# Patient Record
Sex: Female | Born: 1965 | Race: White | Hispanic: No | Smoking: Never smoker
Health system: Southern US, Community
[De-identification: ages and names within clinical notes are randomized; demographics above are authoritative.]

## PROBLEM LIST (undated history)

## (undated) DIAGNOSIS — E119 Type 2 diabetes mellitus without complications: Secondary | ICD-10-CM

## (undated) DIAGNOSIS — E669 Obesity, unspecified: Secondary | ICD-10-CM

## (undated) DIAGNOSIS — I214 Non-ST elevation (NSTEMI) myocardial infarction: Secondary | ICD-10-CM

## (undated) DIAGNOSIS — I252 Old myocardial infarction: Secondary | ICD-10-CM

## (undated) DIAGNOSIS — R061 Stridor: Secondary | ICD-10-CM

## (undated) DIAGNOSIS — M86171 Other acute osteomyelitis, right ankle and foot: Secondary | ICD-10-CM

## (undated) DIAGNOSIS — I2510 Atherosclerotic heart disease of native coronary artery without angina pectoris: Secondary | ICD-10-CM

## (undated) DIAGNOSIS — I10 Essential (primary) hypertension: Secondary | ICD-10-CM

## (undated) DIAGNOSIS — G5711 Meralgia paresthetica, right lower limb: Secondary | ICD-10-CM

## (undated) DIAGNOSIS — W19XXXA Unspecified fall, initial encounter: Secondary | ICD-10-CM

## (undated) DIAGNOSIS — I5042 Chronic combined systolic (congestive) and diastolic (congestive) heart failure: Secondary | ICD-10-CM

## (undated) DIAGNOSIS — D72829 Elevated white blood cell count, unspecified: Secondary | ICD-10-CM

## (undated) DIAGNOSIS — G4733 Obstructive sleep apnea (adult) (pediatric): Secondary | ICD-10-CM

## (undated) DIAGNOSIS — R238 Other skin changes: Secondary | ICD-10-CM

## (undated) DIAGNOSIS — J9600 Acute respiratory failure, unspecified whether with hypoxia or hypercapnia: Secondary | ICD-10-CM

## (undated) DIAGNOSIS — E11621 Type 2 diabetes mellitus with foot ulcer: Secondary | ICD-10-CM

## (undated) DIAGNOSIS — Z8614 Personal history of Methicillin resistant Staphylococcus aureus infection: Secondary | ICD-10-CM

## (undated) DIAGNOSIS — Z9889 Other specified postprocedural states: Secondary | ICD-10-CM

## (undated) DIAGNOSIS — I219 Acute myocardial infarction, unspecified: Secondary | ICD-10-CM

## (undated) DIAGNOSIS — D509 Iron deficiency anemia, unspecified: Secondary | ICD-10-CM

## (undated) DIAGNOSIS — R1310 Dysphagia, unspecified: Secondary | ICD-10-CM

## (undated) DIAGNOSIS — E876 Hypokalemia: Secondary | ICD-10-CM

## (undated) DIAGNOSIS — L97514 Non-pressure chronic ulcer of other part of right foot with necrosis of bone: Secondary | ICD-10-CM

## (undated) DIAGNOSIS — I951 Orthostatic hypotension: Secondary | ICD-10-CM

## (undated) DIAGNOSIS — J810 Acute pulmonary edema: Secondary | ICD-10-CM

## (undated) DIAGNOSIS — R0600 Dyspnea, unspecified: Secondary | ICD-10-CM

## (undated) DIAGNOSIS — Z9989 Dependence on other enabling machines and devices: Secondary | ICD-10-CM

## (undated) DIAGNOSIS — K219 Gastro-esophageal reflux disease without esophagitis: Secondary | ICD-10-CM

## (undated) DIAGNOSIS — J189 Pneumonia, unspecified organism: Secondary | ICD-10-CM

## (undated) HISTORY — DX: Orthostatic hypotension: I95.1

## (undated) HISTORY — DX: Iron deficiency anemia, unspecified: D50.9

## (undated) HISTORY — DX: Hypokalemia: E87.6

## (undated) HISTORY — DX: Old myocardial infarction: I25.2

## (undated) HISTORY — DX: Personal history of Methicillin resistant Staphylococcus aureus infection: Z86.14

## (undated) HISTORY — DX: Other specified postprocedural states: Z98.890

## (undated) HISTORY — PX: BACK SURGERY: SHX140

## (undated) HISTORY — DX: Dyspnea, unspecified: R06.00

## (undated) HISTORY — DX: Dysphagia, unspecified: R13.10

## (undated) HISTORY — DX: Type 2 diabetes mellitus with foot ulcer: E11.621

## (undated) HISTORY — PX: CORONARY ANGIOPLASTY WITH STENT PLACEMENT: SHX49

## (undated) HISTORY — DX: Unspecified fall, initial encounter: W19.XXXA

## (undated) HISTORY — DX: Stridor: R06.1

## (undated) HISTORY — DX: Elevated white blood cell count, unspecified: D72.829

## (undated) HISTORY — DX: Obstructive sleep apnea (adult) (pediatric): G47.33

## (undated) HISTORY — DX: Non-ST elevation (NSTEMI) myocardial infarction: I21.4

## (undated) HISTORY — DX: Other acute osteomyelitis, right ankle and foot: M86.171

## (undated) HISTORY — DX: Other skin changes: R23.8

## (undated) HISTORY — DX: Meralgia paresthetica, right lower limb: G57.11

## (undated) HISTORY — PX: BREAST CYST EXCISION: SHX579

## (undated) HISTORY — DX: Type 2 diabetes mellitus with foot ulcer: L97.514

## (undated) HISTORY — DX: Obesity, unspecified: E66.9

---

## 2000-04-27 ENCOUNTER — Emergency Department (HOSPITAL_COMMUNITY): Admission: EM | Admit: 2000-04-27 | Discharge: 2000-04-27 | Payer: Self-pay

## 2000-10-09 ENCOUNTER — Emergency Department (HOSPITAL_COMMUNITY): Admission: EM | Admit: 2000-10-09 | Discharge: 2000-10-09 | Payer: Self-pay | Admitting: Emergency Medicine

## 2001-03-24 ENCOUNTER — Emergency Department (HOSPITAL_COMMUNITY): Admission: EM | Admit: 2001-03-24 | Discharge: 2001-03-24 | Payer: Self-pay | Admitting: Internal Medicine

## 2001-10-25 ENCOUNTER — Emergency Department (HOSPITAL_COMMUNITY): Admission: EM | Admit: 2001-10-25 | Discharge: 2001-10-25 | Payer: Self-pay | Admitting: Emergency Medicine

## 2001-11-13 ENCOUNTER — Emergency Department (HOSPITAL_COMMUNITY): Admission: EM | Admit: 2001-11-13 | Discharge: 2001-11-13 | Payer: Self-pay | Admitting: Emergency Medicine

## 2003-03-18 ENCOUNTER — Emergency Department (HOSPITAL_COMMUNITY): Admission: EM | Admit: 2003-03-18 | Discharge: 2003-03-18 | Payer: Self-pay | Admitting: Emergency Medicine

## 2003-07-06 ENCOUNTER — Emergency Department (HOSPITAL_COMMUNITY): Admission: EM | Admit: 2003-07-06 | Discharge: 2003-07-06 | Payer: Self-pay | Admitting: Emergency Medicine

## 2003-07-11 ENCOUNTER — Emergency Department (HOSPITAL_COMMUNITY): Admission: EM | Admit: 2003-07-11 | Discharge: 2003-07-11 | Payer: Self-pay | Admitting: Emergency Medicine

## 2003-11-22 ENCOUNTER — Emergency Department (HOSPITAL_COMMUNITY): Admission: AD | Admit: 2003-11-22 | Discharge: 2003-11-22 | Payer: Self-pay | Admitting: Family Medicine

## 2003-11-23 ENCOUNTER — Emergency Department (HOSPITAL_COMMUNITY): Admission: AD | Admit: 2003-11-23 | Discharge: 2003-11-23 | Payer: Self-pay | Admitting: Family Medicine

## 2003-11-25 ENCOUNTER — Ambulatory Visit (HOSPITAL_COMMUNITY): Admission: RE | Admit: 2003-11-25 | Discharge: 2003-11-25 | Payer: Self-pay | Admitting: Family Medicine

## 2003-11-25 ENCOUNTER — Emergency Department (HOSPITAL_COMMUNITY): Admission: AD | Admit: 2003-11-25 | Discharge: 2003-11-25 | Payer: Self-pay | Admitting: Family Medicine

## 2003-11-25 ENCOUNTER — Ambulatory Visit (HOSPITAL_COMMUNITY): Admission: RE | Admit: 2003-11-25 | Discharge: 2003-11-25 | Payer: Self-pay | Admitting: General Surgery

## 2003-11-27 ENCOUNTER — Observation Stay (HOSPITAL_COMMUNITY): Admission: EM | Admit: 2003-11-27 | Discharge: 2003-11-28 | Payer: Self-pay | Admitting: Emergency Medicine

## 2003-11-27 ENCOUNTER — Emergency Department (HOSPITAL_COMMUNITY): Admission: EM | Admit: 2003-11-27 | Discharge: 2003-11-27 | Payer: Self-pay | Admitting: Emergency Medicine

## 2003-12-10 ENCOUNTER — Emergency Department (HOSPITAL_COMMUNITY): Admission: EM | Admit: 2003-12-10 | Discharge: 2003-12-10 | Payer: Self-pay | Admitting: Emergency Medicine

## 2003-12-27 ENCOUNTER — Emergency Department (HOSPITAL_COMMUNITY): Admission: EM | Admit: 2003-12-27 | Discharge: 2003-12-27 | Payer: Self-pay | Admitting: Family Medicine

## 2003-12-28 ENCOUNTER — Emergency Department (HOSPITAL_COMMUNITY): Admission: EM | Admit: 2003-12-28 | Discharge: 2003-12-28 | Payer: Self-pay | Admitting: Family Medicine

## 2003-12-29 ENCOUNTER — Emergency Department (HOSPITAL_COMMUNITY): Admission: AD | Admit: 2003-12-29 | Discharge: 2003-12-29 | Payer: Self-pay | Admitting: Family Medicine

## 2003-12-31 ENCOUNTER — Emergency Department (HOSPITAL_COMMUNITY): Admission: EM | Admit: 2003-12-31 | Discharge: 2003-12-31 | Payer: Self-pay | Admitting: Family Medicine

## 2004-01-06 ENCOUNTER — Encounter: Admission: RE | Admit: 2004-01-06 | Discharge: 2004-01-06 | Payer: Self-pay | Admitting: Internal Medicine

## 2004-01-20 ENCOUNTER — Encounter: Admission: RE | Admit: 2004-01-20 | Discharge: 2004-01-20 | Payer: Self-pay | Admitting: Internal Medicine

## 2004-02-07 ENCOUNTER — Encounter: Admission: RE | Admit: 2004-02-07 | Discharge: 2004-02-07 | Payer: Self-pay | Admitting: Internal Medicine

## 2004-02-19 ENCOUNTER — Emergency Department (HOSPITAL_COMMUNITY): Admission: EM | Admit: 2004-02-19 | Discharge: 2004-02-19 | Payer: Self-pay

## 2004-06-16 ENCOUNTER — Emergency Department (HOSPITAL_COMMUNITY): Admission: EM | Admit: 2004-06-16 | Discharge: 2004-06-16 | Payer: Self-pay | Admitting: *Deleted

## 2004-07-25 ENCOUNTER — Emergency Department (HOSPITAL_COMMUNITY): Admission: EM | Admit: 2004-07-25 | Discharge: 2004-07-25 | Payer: Self-pay

## 2004-11-01 ENCOUNTER — Emergency Department (HOSPITAL_COMMUNITY): Admission: EM | Admit: 2004-11-01 | Discharge: 2004-11-01 | Payer: Self-pay | Admitting: Family Medicine

## 2005-01-01 ENCOUNTER — Emergency Department (HOSPITAL_COMMUNITY): Admission: EM | Admit: 2005-01-01 | Discharge: 2005-01-01 | Payer: Self-pay | Admitting: Family Medicine

## 2005-02-22 ENCOUNTER — Ambulatory Visit: Payer: Self-pay | Admitting: Internal Medicine

## 2005-03-01 ENCOUNTER — Ambulatory Visit: Payer: Self-pay | Admitting: Internal Medicine

## 2005-03-19 ENCOUNTER — Ambulatory Visit (HOSPITAL_BASED_OUTPATIENT_CLINIC_OR_DEPARTMENT_OTHER): Admission: RE | Admit: 2005-03-19 | Discharge: 2005-03-19 | Payer: Self-pay | Admitting: Internal Medicine

## 2005-03-26 ENCOUNTER — Ambulatory Visit: Payer: Self-pay | Admitting: Internal Medicine

## 2005-04-06 ENCOUNTER — Ambulatory Visit: Payer: Self-pay | Admitting: Internal Medicine

## 2005-04-24 ENCOUNTER — Ambulatory Visit: Payer: Self-pay | Admitting: Gastroenterology

## 2005-06-05 ENCOUNTER — Ambulatory Visit: Payer: Self-pay | Admitting: Internal Medicine

## 2005-07-05 ENCOUNTER — Ambulatory Visit: Payer: Self-pay | Admitting: Internal Medicine

## 2005-08-03 ENCOUNTER — Emergency Department (HOSPITAL_COMMUNITY): Admission: EM | Admit: 2005-08-03 | Discharge: 2005-08-03 | Payer: Self-pay | Admitting: Emergency Medicine

## 2005-09-13 ENCOUNTER — Ambulatory Visit: Payer: Self-pay | Admitting: Gastroenterology

## 2005-09-20 ENCOUNTER — Ambulatory Visit: Payer: Self-pay | Admitting: Gastroenterology

## 2005-10-04 ENCOUNTER — Ambulatory Visit: Payer: Self-pay | Admitting: Gastroenterology

## 2005-10-17 ENCOUNTER — Emergency Department (HOSPITAL_COMMUNITY): Admission: EM | Admit: 2005-10-17 | Discharge: 2005-10-17 | Payer: Self-pay | Admitting: Family Medicine

## 2005-10-18 ENCOUNTER — Ambulatory Visit: Payer: Self-pay | Admitting: Gastroenterology

## 2005-10-19 ENCOUNTER — Inpatient Hospital Stay (HOSPITAL_COMMUNITY): Admission: EM | Admit: 2005-10-19 | Discharge: 2005-10-22 | Payer: Self-pay | Admitting: Family Medicine

## 2005-10-19 ENCOUNTER — Ambulatory Visit: Payer: Self-pay | Admitting: Hospitalist

## 2005-11-01 ENCOUNTER — Ambulatory Visit: Payer: Self-pay | Admitting: Internal Medicine

## 2005-11-08 ENCOUNTER — Ambulatory Visit: Payer: Self-pay | Admitting: Gastroenterology

## 2005-12-13 ENCOUNTER — Ambulatory Visit: Payer: Self-pay | Admitting: Gastroenterology

## 2006-01-10 ENCOUNTER — Ambulatory Visit: Payer: Self-pay | Admitting: Gastroenterology

## 2006-02-07 ENCOUNTER — Ambulatory Visit: Payer: Self-pay | Admitting: Gastroenterology

## 2006-03-07 ENCOUNTER — Ambulatory Visit: Payer: Self-pay | Admitting: Gastroenterology

## 2006-04-04 ENCOUNTER — Ambulatory Visit: Payer: Self-pay | Admitting: Gastroenterology

## 2006-05-02 ENCOUNTER — Ambulatory Visit: Payer: Self-pay | Admitting: Gastroenterology

## 2006-05-31 ENCOUNTER — Ambulatory Visit: Payer: Self-pay | Admitting: Gastroenterology

## 2006-06-27 ENCOUNTER — Ambulatory Visit: Payer: Self-pay | Admitting: Gastroenterology

## 2006-07-18 ENCOUNTER — Ambulatory Visit: Payer: Self-pay | Admitting: Gastroenterology

## 2006-07-25 ENCOUNTER — Ambulatory Visit: Payer: Self-pay | Admitting: Gastroenterology

## 2006-08-22 ENCOUNTER — Ambulatory Visit: Payer: Self-pay | Admitting: Gastroenterology

## 2006-12-11 ENCOUNTER — Emergency Department (HOSPITAL_COMMUNITY): Admission: EM | Admit: 2006-12-11 | Discharge: 2006-12-12 | Payer: Self-pay | Admitting: Emergency Medicine

## 2007-08-27 ENCOUNTER — Emergency Department (HOSPITAL_COMMUNITY): Admission: EM | Admit: 2007-08-27 | Discharge: 2007-08-27 | Payer: Self-pay | Admitting: Family Medicine

## 2007-11-19 ENCOUNTER — Emergency Department (HOSPITAL_COMMUNITY): Admission: EM | Admit: 2007-11-19 | Discharge: 2007-11-20 | Payer: Self-pay | Admitting: Emergency Medicine

## 2008-04-02 ENCOUNTER — Emergency Department (HOSPITAL_COMMUNITY): Admission: EM | Admit: 2008-04-02 | Discharge: 2008-04-02 | Payer: Self-pay | Admitting: Family Medicine

## 2008-08-11 ENCOUNTER — Emergency Department (HOSPITAL_COMMUNITY): Admission: EM | Admit: 2008-08-11 | Discharge: 2008-08-11 | Payer: Self-pay | Admitting: Family Medicine

## 2008-11-11 ENCOUNTER — Inpatient Hospital Stay (HOSPITAL_COMMUNITY): Admission: EM | Admit: 2008-11-11 | Discharge: 2008-11-13 | Payer: Self-pay | Admitting: Emergency Medicine

## 2009-04-03 ENCOUNTER — Emergency Department (HOSPITAL_COMMUNITY): Admission: EM | Admit: 2009-04-03 | Discharge: 2009-04-04 | Payer: Self-pay | Admitting: Emergency Medicine

## 2009-06-25 ENCOUNTER — Emergency Department (HOSPITAL_COMMUNITY): Admission: EM | Admit: 2009-06-25 | Discharge: 2009-06-25 | Payer: Self-pay | Admitting: Family Medicine

## 2009-07-04 ENCOUNTER — Emergency Department (HOSPITAL_COMMUNITY): Admission: EM | Admit: 2009-07-04 | Discharge: 2009-07-04 | Payer: Self-pay | Admitting: Emergency Medicine

## 2009-11-29 ENCOUNTER — Ambulatory Visit: Payer: Self-pay | Admitting: Diagnostic Radiology

## 2009-11-29 ENCOUNTER — Emergency Department (HOSPITAL_BASED_OUTPATIENT_CLINIC_OR_DEPARTMENT_OTHER): Admission: EM | Admit: 2009-11-29 | Discharge: 2009-11-29 | Payer: Self-pay | Admitting: Emergency Medicine

## 2009-12-07 ENCOUNTER — Emergency Department (HOSPITAL_COMMUNITY): Admission: EM | Admit: 2009-12-07 | Discharge: 2009-12-07 | Payer: Self-pay | Admitting: Emergency Medicine

## 2009-12-27 ENCOUNTER — Emergency Department (HOSPITAL_COMMUNITY): Admission: EM | Admit: 2009-12-27 | Discharge: 2009-12-27 | Payer: Self-pay | Admitting: Emergency Medicine

## 2010-01-17 ENCOUNTER — Emergency Department (HOSPITAL_COMMUNITY): Admission: EM | Admit: 2010-01-17 | Discharge: 2010-01-17 | Payer: Self-pay | Admitting: Family Medicine

## 2010-05-05 ENCOUNTER — Emergency Department (HOSPITAL_COMMUNITY): Admission: EM | Admit: 2010-05-05 | Discharge: 2010-05-05 | Payer: Self-pay | Admitting: Emergency Medicine

## 2010-07-21 ENCOUNTER — Inpatient Hospital Stay (HOSPITAL_COMMUNITY): Admission: EM | Admit: 2010-07-21 | Discharge: 2010-07-24 | Payer: Self-pay | Admitting: Emergency Medicine

## 2010-07-22 ENCOUNTER — Encounter (INDEPENDENT_AMBULATORY_CARE_PROVIDER_SITE_OTHER): Payer: Self-pay | Admitting: Internal Medicine

## 2010-11-05 DIAGNOSIS — Z8614 Personal history of Methicillin resistant Staphylococcus aureus infection: Secondary | ICD-10-CM

## 2010-11-05 HISTORY — DX: Personal history of Methicillin resistant Staphylococcus aureus infection: Z86.14

## 2011-01-18 LAB — CBC
HCT: 37.7 % (ref 36.0–46.0)
Hemoglobin: 11.7 g/dL — ABNORMAL LOW (ref 12.0–15.0)
Hemoglobin: 11.8 g/dL — ABNORMAL LOW (ref 12.0–15.0)
MCH: 24.6 pg — ABNORMAL LOW (ref 26.0–34.0)
MCHC: 31.2 g/dL (ref 30.0–36.0)
MCV: 79 fL (ref 78.0–100.0)
MCV: 80.5 fL (ref 78.0–100.0)
Platelets: 189 10*3/uL (ref 150–400)
Platelets: 215 10*3/uL (ref 150–400)
RBC: 4.76 MIL/uL (ref 3.87–5.11)
RBC: 4.77 MIL/uL (ref 3.87–5.11)
RBC: 4.79 MIL/uL (ref 3.87–5.11)
WBC: 13.1 10*3/uL — ABNORMAL HIGH (ref 4.0–10.5)
WBC: 15.8 10*3/uL — ABNORMAL HIGH (ref 4.0–10.5)

## 2011-01-18 LAB — VITAMIN B12: Vitamin B-12: 474 pg/mL (ref 211–911)

## 2011-01-18 LAB — LIPID PANEL
HDL: 40 mg/dL (ref >39)
LDL Cholesterol: 104 mg/dL — ABNORMAL HIGH (ref 0–99)
Triglycerides: 156 mg/dL — ABNORMAL HIGH (ref <150)
VLDL: 31 mg/dL (ref 0–40)

## 2011-01-18 LAB — GLUCOSE, CAPILLARY
Glucose-Capillary: 137 mg/dL — ABNORMAL HIGH (ref 70–99)
Glucose-Capillary: 171 mg/dL — ABNORMAL HIGH (ref 70–99)
Glucose-Capillary: 171 mg/dL — ABNORMAL HIGH (ref 70–99)
Glucose-Capillary: 190 mg/dL — ABNORMAL HIGH (ref 70–99)

## 2011-01-18 LAB — CARDIAC PANEL(CRET KIN+CKTOT+MB+TROPI)
CK, MB: 2.4 ng/mL (ref 0.3–4.0)
CK, MB: 3.1 ng/mL (ref 0.3–4.0)
Relative Index: INVALID (ref 0.0–2.5)
Total CK: 50 U/L (ref 7–177)

## 2011-01-18 LAB — BASIC METABOLIC PANEL
BUN: 12 mg/dL (ref 6–23)
CO2: 29 mEq/L (ref 19–32)
CO2: 34 mEq/L — ABNORMAL HIGH (ref 19–32)
Calcium: 8.8 mg/dL (ref 8.4–10.5)
Chloride: 95 mEq/L — ABNORMAL LOW (ref 96–112)
Chloride: 98 mEq/L (ref 96–112)
Creatinine, Ser: 0.67 mg/dL (ref 0.4–1.2)
Creatinine, Ser: 0.71 mg/dL (ref 0.4–1.2)
Creatinine, Ser: 0.76 mg/dL (ref 0.4–1.2)
GFR calc Af Amer: >60 mL/min (ref >60)
GFR calc Af Amer: >60 mL/min (ref >60)
GFR calc non Af Amer: >60 mL/min (ref >60)
Glucose, Bld: 138 mg/dL — ABNORMAL HIGH (ref 70–99)
Glucose, Bld: 192 mg/dL — ABNORMAL HIGH (ref 70–99)
Potassium: 3.5 mEq/L (ref 3.5–5.1)
Potassium: 3.8 mEq/L (ref 3.5–5.1)
Sodium: 136 mEq/L (ref 135–145)

## 2011-01-18 LAB — IRON AND TIBC
Iron: 39 ug/dL — ABNORMAL LOW (ref 42–135)
TIBC: 336 ug/dL (ref 250–470)

## 2011-01-18 LAB — COMPREHENSIVE METABOLIC PANEL
ALT: 21 U/L (ref 0–35)
ALT: 26 U/L (ref 0–35)
AST: 28 U/L (ref 0–37)
Albumin: 3.7 g/dL (ref 3.5–5.2)
Alkaline Phosphatase: 74 U/L (ref 39–117)
Alkaline Phosphatase: 79 U/L (ref 39–117)
BUN: 12 mg/dL (ref 6–23)
BUN: 9 mg/dL (ref 6–23)
Chloride: 96 mEq/L (ref 96–112)
Chloride: 96 mEq/L (ref 96–112)
GFR calc Af Amer: >60 mL/min (ref >60)
Glucose, Bld: 194 mg/dL — ABNORMAL HIGH (ref 70–99)
Potassium: 3.9 mEq/L (ref 3.5–5.1)
Potassium: 4 mEq/L (ref 3.5–5.1)
Sodium: 137 mEq/L (ref 135–145)
Sodium: 137 mEq/L (ref 135–145)
Total Bilirubin: 0.9 mg/dL (ref 0.3–1.2)
Total Bilirubin: 1 mg/dL (ref 0.3–1.2)

## 2011-01-18 LAB — DIFFERENTIAL
Basophils Absolute: 0 10*3/uL (ref 0.0–0.1)
Basophils Relative: 0 % (ref 0–1)
Eosinophils Absolute: 0.1 10*3/uL (ref 0.0–0.7)
Monocytes Relative: 5 % (ref 3–12)
Neutro Abs: 10.6 10*3/uL — ABNORMAL HIGH (ref 1.7–7.7)
Neutrophils Relative %: 84 % — ABNORMAL HIGH (ref 43–77)

## 2011-01-18 LAB — HEMOGLOBIN A1C
Hgb A1c MFr Bld: 8.8 % — ABNORMAL HIGH (ref <5.7)
Mean Plasma Glucose: 206 mg/dL — ABNORMAL HIGH (ref <117)

## 2011-01-18 LAB — TSH: TSH: 1.519 u[IU]/mL (ref 0.350–4.500)

## 2011-01-18 LAB — RAPID URINE DRUG SCREEN, HOSP PERFORMED
Cocaine: NOT DETECTED
Tetrahydrocannabinol: POSITIVE — AB

## 2011-01-18 LAB — FERRITIN: Ferritin: 51 ng/mL (ref 10–291)

## 2011-01-18 LAB — TROPONIN I: Troponin I: 0.05 ng/mL (ref 0.00–0.06)

## 2011-01-18 LAB — RETICULOCYTES: Retic Ct Pct: 2.8 % (ref 0.4–3.1)

## 2011-01-18 LAB — PROTIME-INR
INR: 1.1 (ref 0.00–1.49)
Prothrombin Time: 14.4 seconds (ref 11.6–15.2)

## 2011-01-18 LAB — CK TOTAL AND CKMB (NOT AT ARMC)
CK, MB: 2.8 ng/mL (ref 0.3–4.0)
Total CK: 83 U/L (ref 7–177)

## 2011-01-18 LAB — FOLATE: Folate: 16 ng/mL

## 2011-01-18 LAB — POCT CARDIAC MARKERS: CKMB, poc: 1.3 ng/mL (ref 1.0–8.0)

## 2011-01-18 LAB — APTT: aPTT: 32 seconds (ref 24–37)

## 2011-01-18 LAB — BRAIN NATRIURETIC PEPTIDE: Pro B Natriuretic peptide (BNP): <30 pg/mL (ref 0.0–100.0)

## 2011-01-18 LAB — MRSA PCR SCREENING: MRSA by PCR: NEGATIVE

## 2011-01-21 LAB — URINALYSIS, ROUTINE W REFLEX MICROSCOPIC
Bilirubin Urine: NEGATIVE
Nitrite: NEGATIVE
Specific Gravity, Urine: 1.018 (ref 1.005–1.030)
Urobilinogen, UA: 1 mg/dL (ref 0.0–1.0)
pH: 7.5 (ref 5.0–8.0)

## 2011-01-21 LAB — DIFFERENTIAL
Basophils Absolute: 0 10*3/uL (ref 0.0–0.1)
Eosinophils Relative: 1 % (ref 0–5)
Lymphocytes Relative: 15 % (ref 12–46)
Lymphs Abs: 1.7 10*3/uL (ref 0.7–4.0)
Neutro Abs: 9.3 10*3/uL — ABNORMAL HIGH (ref 1.7–7.7)
Neutrophils Relative %: 82 % — ABNORMAL HIGH (ref 43–77)

## 2011-01-21 LAB — POCT I-STAT, CHEM 8
Calcium, Ion: 0.98 mmol/L — ABNORMAL LOW (ref 1.12–1.32)
Chloride: 102 mEq/L (ref 96–112)
Creatinine, Ser: 0.7 mg/dL (ref 0.4–1.2)
Glucose, Bld: 196 mg/dL — ABNORMAL HIGH (ref 70–99)
Hemoglobin: 12.9 g/dL (ref 12.0–15.0)
Potassium: 3.9 mEq/L (ref 3.5–5.1)

## 2011-01-21 LAB — POCT CARDIAC MARKERS
CKMB, poc: <1 ng/mL — ABNORMAL LOW (ref 1.0–8.0)
Troponin i, poc: <0.05 ng/mL (ref 0.00–0.09)

## 2011-01-21 LAB — CBC
MCV: 75.9 fL — ABNORMAL LOW (ref 78.0–100.0)
Platelets: 192 10*3/uL (ref 150–400)
RBC: 4.67 MIL/uL (ref 3.87–5.11)
RDW: 18.7 % — ABNORMAL HIGH (ref 11.5–15.5)
WBC: 11.4 10*3/uL — ABNORMAL HIGH (ref 4.0–10.5)

## 2011-01-21 LAB — BRAIN NATRIURETIC PEPTIDE: Pro B Natriuretic peptide (BNP): 96 pg/mL (ref 0.0–100.0)

## 2011-01-21 LAB — URINE MICROSCOPIC-ADD ON

## 2011-01-21 LAB — GLUCOSE, CAPILLARY: Glucose-Capillary: 210 mg/dL — ABNORMAL HIGH (ref 70–99)

## 2011-01-22 LAB — BASIC METABOLIC PANEL
Chloride: 99 mEq/L (ref 96–112)
GFR calc non Af Amer: >60 mL/min (ref >60)
Potassium: 4 mEq/L (ref 3.5–5.1)
Sodium: 139 mEq/L (ref 135–145)

## 2011-01-22 LAB — CBC
HCT: 35.3 % — ABNORMAL LOW (ref 36.0–46.0)
Hemoglobin: 11.7 g/dL — ABNORMAL LOW (ref 12.0–15.0)
RBC: 4.58 MIL/uL (ref 3.87–5.11)
WBC: 11.2 10*3/uL — ABNORMAL HIGH (ref 4.0–10.5)

## 2011-01-22 LAB — DIFFERENTIAL
Eosinophils Relative: 1 % (ref 0–5)
Lymphocytes Relative: 20 % (ref 12–46)
Lymphs Abs: 2.3 10*3/uL (ref 0.7–4.0)
Monocytes Absolute: 0.4 10*3/uL (ref 0.1–1.0)
Monocytes Relative: 4 % (ref 3–12)

## 2011-01-22 LAB — POCT B-TYPE NATRIURETIC PEPTIDE (BNP): B Natriuretic Peptide, POC: 46.5 pg/mL (ref 0–100)

## 2011-01-22 LAB — POCT CARDIAC MARKERS
CKMB, poc: <1 ng/mL — ABNORMAL LOW (ref 1.0–8.0)
Troponin i, poc: <0.05 ng/mL (ref 0.00–0.09)

## 2011-01-24 LAB — GLUCOSE, CAPILLARY: Glucose-Capillary: 191 mg/dL — ABNORMAL HIGH (ref 70–99)

## 2011-01-29 ENCOUNTER — Emergency Department (HOSPITAL_COMMUNITY)
Admission: EM | Admit: 2011-01-29 | Discharge: 2011-01-29 | Disposition: A | Discharge status: Home / Self Care | Payer: Self-pay | Attending: Emergency Medicine | Admitting: Emergency Medicine

## 2011-01-29 DIAGNOSIS — R599 Enlarged lymph nodes, unspecified: Secondary | ICD-10-CM | POA: Insufficient documentation

## 2011-01-29 DIAGNOSIS — I10 Essential (primary) hypertension: Secondary | ICD-10-CM | POA: Insufficient documentation

## 2011-01-29 DIAGNOSIS — J309 Allergic rhinitis, unspecified: Secondary | ICD-10-CM | POA: Insufficient documentation

## 2011-01-29 DIAGNOSIS — J029 Acute pharyngitis, unspecified: Secondary | ICD-10-CM | POA: Insufficient documentation

## 2011-01-29 DIAGNOSIS — E119 Type 2 diabetes mellitus without complications: Secondary | ICD-10-CM | POA: Insufficient documentation

## 2011-01-29 DIAGNOSIS — R220 Localized swelling, mass and lump, head: Secondary | ICD-10-CM | POA: Insufficient documentation

## 2011-01-29 DIAGNOSIS — H9209 Otalgia, unspecified ear: Secondary | ICD-10-CM | POA: Insufficient documentation

## 2011-01-29 DIAGNOSIS — R221 Localized swelling, mass and lump, neck: Secondary | ICD-10-CM | POA: Insufficient documentation

## 2011-01-29 DIAGNOSIS — E785 Hyperlipidemia, unspecified: Secondary | ICD-10-CM | POA: Insufficient documentation

## 2011-01-29 LAB — RAPID STREP SCREEN (MED CTR MEBANE ONLY): Streptococcus, Group A Screen (Direct): NEGATIVE

## 2011-01-30 ENCOUNTER — Emergency Department (HOSPITAL_COMMUNITY)
Admission: EM | Admit: 2011-01-30 | Discharge: 2011-01-31 | Disposition: A | Discharge status: Home / Self Care | Payer: Self-pay | Attending: Emergency Medicine | Admitting: Emergency Medicine

## 2011-01-30 DIAGNOSIS — G4730 Sleep apnea, unspecified: Secondary | ICD-10-CM | POA: Insufficient documentation

## 2011-01-30 DIAGNOSIS — E785 Hyperlipidemia, unspecified: Secondary | ICD-10-CM | POA: Insufficient documentation

## 2011-01-30 DIAGNOSIS — I10 Essential (primary) hypertension: Secondary | ICD-10-CM | POA: Insufficient documentation

## 2011-01-30 DIAGNOSIS — R5381 Other malaise: Secondary | ICD-10-CM | POA: Insufficient documentation

## 2011-01-30 DIAGNOSIS — H9209 Otalgia, unspecified ear: Secondary | ICD-10-CM | POA: Insufficient documentation

## 2011-01-30 DIAGNOSIS — E119 Type 2 diabetes mellitus without complications: Secondary | ICD-10-CM | POA: Insufficient documentation

## 2011-01-30 DIAGNOSIS — E669 Obesity, unspecified: Secondary | ICD-10-CM | POA: Insufficient documentation

## 2011-01-30 DIAGNOSIS — J029 Acute pharyngitis, unspecified: Secondary | ICD-10-CM | POA: Insufficient documentation

## 2011-01-30 DIAGNOSIS — R6883 Chills (without fever): Secondary | ICD-10-CM | POA: Insufficient documentation

## 2011-02-06 ENCOUNTER — Inpatient Hospital Stay (INDEPENDENT_AMBULATORY_CARE_PROVIDER_SITE_OTHER)
Admission: RE | Admit: 2011-02-06 | Discharge: 2011-02-06 | Disposition: A | Discharge status: Home / Self Care | Payer: Self-pay | Source: Ambulatory Visit | Attending: Family Medicine | Admitting: Family Medicine

## 2011-02-06 DIAGNOSIS — J019 Acute sinusitis, unspecified: Secondary | ICD-10-CM

## 2011-02-06 DIAGNOSIS — R198 Other specified symptoms and signs involving the digestive system and abdomen: Secondary | ICD-10-CM

## 2011-02-19 ENCOUNTER — Inpatient Hospital Stay (INDEPENDENT_AMBULATORY_CARE_PROVIDER_SITE_OTHER)
Admission: RE | Admit: 2011-02-19 | Discharge: 2011-02-19 | Disposition: A | Discharge status: Home / Self Care | Payer: Self-pay | Source: Ambulatory Visit | Attending: Family Medicine | Admitting: Family Medicine

## 2011-02-19 DIAGNOSIS — H109 Unspecified conjunctivitis: Secondary | ICD-10-CM

## 2011-02-19 LAB — CBC
HCT: 35.2 % — ABNORMAL LOW (ref 36.0–46.0)
MCHC: 31.9 g/dL (ref 30.0–36.0)
MCHC: 32.5 g/dL (ref 30.0–36.0)
MCV: 76.2 fL — ABNORMAL LOW (ref 78.0–100.0)
MCV: 77 fL — ABNORMAL LOW (ref 78.0–100.0)
Platelets: 194 10*3/uL (ref 150–400)
Platelets: 225 10*3/uL (ref 150–400)
RBC: 4.98 MIL/uL (ref 3.87–5.11)
RDW: 17.3 % — ABNORMAL HIGH (ref 11.5–15.5)

## 2011-02-19 LAB — HEPARIN LEVEL (UNFRACTIONATED)
Heparin Unfractionated: 0.35 IU/mL (ref 0.30–0.70)
Heparin Unfractionated: 0.38 IU/mL (ref 0.30–0.70)

## 2011-02-19 LAB — COMPREHENSIVE METABOLIC PANEL
AST: 23 U/L (ref 0–37)
CO2: 28 mEq/L (ref 19–32)
Chloride: 104 mEq/L (ref 96–112)
Creatinine, Ser: 0.59 mg/dL (ref 0.4–1.2)
GFR calc Af Amer: >60 mL/min (ref >60)
GFR calc non Af Amer: >60 mL/min (ref >60)
Glucose, Bld: 229 mg/dL — ABNORMAL HIGH (ref 70–99)
Total Bilirubin: 0.6 mg/dL (ref 0.3–1.2)

## 2011-02-19 LAB — LIPID PANEL
HDL: 40 mg/dL (ref >39)
LDL Cholesterol: 118 mg/dL — ABNORMAL HIGH (ref 0–99)
Total CHOL/HDL Ratio: 5.2 RATIO
Triglycerides: 130 mg/dL (ref <150)
VLDL: 26 mg/dL (ref 0–40)
VLDL: 38 mg/dL (ref 0–40)

## 2011-02-19 LAB — CARDIAC PANEL(CRET KIN+CKTOT+MB+TROPI)
CK, MB: 1.2 ng/mL (ref 0.3–4.0)
CK, MB: 1.5 ng/mL (ref 0.3–4.0)
Relative Index: INVALID (ref 0.0–2.5)
Troponin I: 0.01 ng/mL (ref 0.00–0.06)
Troponin I: 0.02 ng/mL (ref 0.00–0.06)

## 2011-02-19 LAB — DIFFERENTIAL
Basophils Absolute: 0 10*3/uL (ref 0.0–0.1)
Basophils Relative: 0 % (ref 0–1)
Monocytes Relative: 3 % (ref 3–12)
Neutro Abs: 8.4 10*3/uL — ABNORMAL HIGH (ref 1.7–7.7)
Neutrophils Relative %: 81 % — ABNORMAL HIGH (ref 43–77)

## 2011-02-19 LAB — GLUCOSE, CAPILLARY
Glucose-Capillary: 187 mg/dL — ABNORMAL HIGH (ref 70–99)
Glucose-Capillary: 190 mg/dL — ABNORMAL HIGH (ref 70–99)
Glucose-Capillary: 233 mg/dL — ABNORMAL HIGH (ref 70–99)

## 2011-02-19 LAB — POCT CARDIAC MARKERS
CKMB, poc: 1.5 ng/mL (ref 1.0–8.0)
Myoglobin, poc: 47.5 ng/mL (ref 12–200)

## 2011-02-19 LAB — BASIC METABOLIC PANEL
CO2: 27 mEq/L (ref 19–32)
Calcium: 8.7 mg/dL (ref 8.4–10.5)
Creatinine, Ser: 0.75 mg/dL (ref 0.4–1.2)
GFR calc Af Amer: >60 mL/min (ref >60)

## 2011-02-19 LAB — PROTIME-INR: INR: 1 (ref 0.00–1.49)

## 2011-02-19 LAB — HEMOGLOBIN A1C: Hgb A1c MFr Bld: 9 % — ABNORMAL HIGH (ref 4.6–6.1)

## 2011-02-19 LAB — TROPONIN I: Troponin I: 0.01 ng/mL (ref 0.00–0.06)

## 2011-02-19 LAB — C-REACTIVE PROTEIN: CRP: 3.2 mg/dL — ABNORMAL HIGH (ref <0.6)

## 2011-02-19 LAB — CK TOTAL AND CKMB (NOT AT ARMC): Relative Index: INVALID (ref 0.0–2.5)

## 2011-03-23 NOTE — H&P (Signed)
[name], [name]                           ACCOUNT NO.:  [id number]   MEDICAL RECORD NO.:  [id number]                   PATIENT TYPE:  OIB   LOCATION:  2852                                 FACILITY:  [facility]   PHYSICIAN:  Jimmye Norman III, M.D.               DATE OF BIRTH:  1966-01-08   DATE OF ADMISSION:  11/25/2003  DATE OF DISCHARGE:  11/25/2003                                HISTORY & PHYSICAL   IDENTIFICATION AND CHIEF COMPLAINT:  The patient is a 45 year old female  with a right breast abscess who is being admitted for incision, drainage and  packing.   HISTORY OF PRESENT ILLNESS:  I was contacted by the urgent care center about  this patient early in the day who had been treated for several days and  maybe weeks for right breast abscess.  Previous aspiration attempts were  negative and attempt to drain it in the office was negative.  However, an  ultrasound demonstrated a fluid collection in the right breast and she was  called in for surgical evaluation.   PAST MEDICAL HISTORY:  Her past medical history, by her report, is  unremarkable.  She has no history of diabetes or other significant medical  problems.  She takes only the antibiotics that she was given for her right  breast abscess and has pain medicines also.   PHYSICAL EXAMINATION:  VITAL SIGNS:  On examination, she is afebrile.  Her  blood pressure was normal in the preop holding area, but once she was  elevated to 164/96, her temperature was 99.1, pulse of 96, respirations 18.  BREASTS:  Right breast demonstrated a very firm, very tender and reddened  area in the midportion of the upper breast.  The firmness extended about 10  cm in maximal diameter and also a necrotic center with fluctuance.   IMPRESSION:  Right breast abscess, aerobic and possibly anaerobic.   PLAN:  I will take her to the operating room for incision and drainage and  she will get perioperative vancomycin.  By report, the patient has MRSA  that  has grown out of previous aspiration and we will cover her with that with  vancomycin.  Postoperatively, we will use Zyvox.                                                Kathrin Ruddy, M.D.    JW/MEDQ  D:  11/25/2003  T:  11/26/2003  Job:  [id number]

## 2011-03-23 NOTE — Discharge Summary (Signed)
[name], [name]                 ACCOUNT NO.:  [id number]   MEDICAL RECORD NO.:  [id number]          PATIENT TYPE:  INP   LOCATION:  3034                         FACILITY:  [facility]   PHYSICIAN:  Eliseo Gum, M.D.   DATE OF BIRTH:  1966-02-06   DATE OF ADMISSION:  10/19/2005  DATE OF DISCHARGE:  10/22/2005                                 DISCHARGE SUMMARY   DISCHARGE DIAGNOSIS:  1.  Methicillin resistant Staphylococcus aureus abscess of left forearm.  2.  Type 2 diabetes mellitus with hemoglobin A1C of 7.  3.  Hypertension.   PAST MEDICAL HISTORY:  History of multiple MRSA abscesses, obesity,  hepatitis C on treatment.   DISCHARGE MEDICATIONS:  Metformin 1000 mg p.o. b.i.d. (this has been  increased from 500 mg p.o. b.i.d.), Lisinopril 40 mg p.o. daily, Epogen 200  mg p.o. b.i.d., Tikosyn subcutaneous injection every Saturday, Doxycycline  100 mg p.o. b.i.d. for five more days for a total of ten days of  antibiotics.   FOLLOW UP:  The patient will follow up with Dr. Merlene Pulling at the outpatient  clinic on November 01, 2005, at 2:30 p.m. for follow up on the abscess.  An  appointment will be scheduled on that day with her clinic doctor, as well.   ADMISSION HISTORY:  [name] is a 45 year old obese female with a past  medical history of diabetes, hepatitis C, recurrent abscesses since her  diagnosis of diabetes in 2003 who presented to Urgent Care on October 17, 2005, with a recent abscess on the left forearm.  This abscess was  subsequently drained and the patient followed up with Urgent Care today.  However, per Urgent Care report, it did not look very good and, thus, she is  being admitted for IV vancomycin because of failure of Doxycycline.   The patient does not smoke, no alcohol use, no IV drug use.   SOCIAL HISTORY:  The patient is self-care, lives with her daughter who is 68-  years-old.   PHYSICAL EXAMINATION:  Vital signs:  Temperature 97.7, blood pressure  156/100, pulse 80, respirations 24, O2 saturations 97% on room air.  General:  The patient is in no acute distress.  Lungs:  Clear to  auscultation bilaterally, no rales, rhonchi, or wheezing.  Cardiovascular:  Regular rate and rhythm, no murmurs.  GI:  Abdomen soft, nontender,  nondistended, positive bowel sounds.  Extremities:  No cyanosis, clubbing,  and edema.  Skin:  She has a left forearm induration about 6 cm which is  actively draining.  The surrounding skin is erythematous and tender.   HOSPITAL COURSE:  Problem 1:  MRSA abscess of the left forearm.  As mentioned earlier, the  patient received incision and drainage procedure on October 17, 2005, at  Urgent Care.  The culture grew MRSA which was sensitive to Doxycycline,  however, because of lack of improvement with Doxycycline, the patient is  admitted for IV vancomycin.  She received IV vancomycin for a total of four  days and she is being discharged home on p.o. Doxycycline for the next five  days for a total of ten days of antibiotics.  She will be followed at the  outpatient clinic by Dr. Merlene Pulling on November 01, 2005, to insure complete  resolution.   Problem 2:  Type 2 diabetes mellitus.  A hemoglobin A1C was checked and was  7.  Her Metformin dose was increased to 1000 mg p.o. b.i.d.   Problem 3:  Hypertension.  Her Lisinopril was continued to 40 mg p.o. daily.   Problem 4:  Hepatitis C.  Her hepatitis C medicines were continued in the  hospital.   DISCHARGE LABORATORY DATA:  White count 6.3, hemoglobin 11.8, platelets 182.  Hemoglobin A1C 7.  Fasting lipid panel done on October 19, 2005, showed  total cholesterol 175, triglycerides 154, HDL 46, LDL 98.  TSH 1.984.      Ellie Lunch, M.D.  Electronically Signed      Eliseo Gum, M.D.  Electronically Signed    BP/MEDQ  D:  10/22/2005  T:  10/23/2005  Job:  [id number]   cc:   Manning Charity, MD  Fax: [phone]

## 2011-03-23 NOTE — H&P (Signed)
NAMEKRYSTENA, [name]                           ACCOUNT NO.:  [id number]   MEDICAL RECORD NO.:  [id number]                   PATIENT TYPE:  INP   LOCATION:  1829                                 FACILITY:  [facility]   PHYSICIAN:  Adolph Pollack, M.D.            DATE OF BIRTH:  [birth year]   DATE OF ADMISSION:  11/27/2003  DATE OF DISCHARGE:                                HISTORY & PHYSICAL   REASON FOR ADMISSION:  Right breast wound bleeding.   HISTORY OF PRESENT ILLNESS:  [name] is a 45 year old female who underwent  incision and drainage and packing of a right breast abscess by Dr. Lindie Spruce on  November 25, 2003.  She returned to the emergency department today for  removal of the packing, and I inspected the wound, and it was hemostatic.  A  Penrose drain was placed in this to keep the wound open.  She went home and  then had a coughing episode and began having significant bleeding from the  wound and thus presented back to the emergency department.  In spite of our  efforts to try to stop the bleeding with direct pressure, she continued to  have bleeding.  She is being admitted to go to the operating room for  control of the right breast wound bleeding.   PAST MEDICAL HISTORY:  Right breast abscess.   PREVIOUS OPERATIONS:  Incision and drainage of right breast abscess.   ALLERGIES:  None.   REVIEW OF SYSTEMS:  CARDIOVASCULAR:  No heart disease or hypertension.  ENDOCRINE:  No diabetes.  PULMONARY:  No asthma, pneumonia, COPD.  GASTROINTESTINAL:  No peptic ulcer disease or hepatitis.  HEMATOLOGIC:  No  known bleeding disorders or deep vein thrombosis.   PHYSICAL EXAM:  GENERAL:  Obese female who is a bit anxious, but she is very  pleasant and cooperative.  NECK:  Supple.  Without masses.  CARDIOVASCULAR:  Heart demonstrates a regular rate and rhythm.  No murmur  heard.  LUNGS:  Breath sounds equal and clear.  Respirations unlabored.  BREASTS:  There is a 3- to 4-cm wound in the  right breast superior aspect  with some bright red blood emanating from it.  This is covered now with  multiple gauze packs.  ABDOMEN:  Soft and nontender.  EXTREMITIES:  Full range of motion.  No cyanosis or edema.   IMPRESSION:  Bleeding right breast wound, status post incision and drainage  of abscess 2 days ago.   PLAN:  To the OR for wound exploration and hemostasis.  This has been  explained to her, and she is agreeable.                                                Adolph Pollack, M.D.  TJR/MEDQ  D:  11/27/2003  T:  11/27/2003  Job:  [id number]

## 2011-03-23 NOTE — Op Note (Signed)
[name], [name]                           ACCOUNT NO.:  [id number]   MEDICAL RECORD NO.:  [id number]                   PATIENT TYPE:  OIB   LOCATION:  2852                                 FACILITY:  [facility]   PHYSICIAN:  Jimmye Norman III, M.D.               DATE OF BIRTH:  01-01-1966   DATE OF PROCEDURE:  11/25/2003  DATE OF DISCHARGE:  11/25/2003                                 OPERATIVE REPORT   PREOPERATIVE DIAGNOSES:  Right breast abscess.   POSTOPERATIVE DIAGNOSES:  Right breast abscess.   OPERATION PERFORMED:  Incision and drainage and packing of right breast  abscess.   SURGEON:  Marta Lamas. Lindie Spruce, M.D.   ASSISTANT:  None.   ANESTHESIA:  General endotracheal.   ESTIMATED BLOOD LOSS:  Less than 20 mL.   COMPLICATIONS:  None.   CONDITION:  Stable.   PACKING:  Two full bottles of 1/2 inch iodoform Nugauze packed into the  wound.   DESCRIPTION OF PROCEDURE:  The patient was taken to the operating room and  placed on the table in the supine position.  After an adequate endotracheal  anesthetic was administered, the patient was prepped and draped in the usual  sterile manner exposing the right breast.  This abscess cavity was in the  upper midportion of the right breast.  It already had a necrotic center.  We  incised along the necrotic center down deep into the breast and it took  about an inch to an inch and a half depth prior to getting into a purulent  cavity which was not foul-smelling and sort of a brownish mucopurulence  drainage.  We sent aerobic and anaerobic cultures.   We subsequently broke up all loculations down to the pectoral fascia,  irrigated out with saline and then subsequently packed it with two full  bottles of half inch iodoform Nugauze.  The patient did receive preoperative  vancomycin.  Once it was packed it was covered with sterile dressing.  The  patient was then transferred to the recovery room in stable condition.                         Kathrin Ruddy, M.D.    JW/MEDQ  D:  11/25/2003  T:  11/26/2003  Job:  [id number]

## 2011-03-23 NOTE — Procedures (Signed)
[name], [name]                 ACCOUNT NO.:  [id number]   MEDICAL RECORD NO.:  [id number]          PATIENT TYPE:  OUT   LOCATION:  SLEEP CENTER                 FACILITY:  [facility]   PHYSICIAN:  Clinton D. Maple Hudson, M.D. DATE OF BIRTH:  1966/09/22   DATE OF STUDY:  03/19/2005                              NOCTURNAL POLYSOMNOGRAM   REFERRING PHYSICIAN:  Dr. Trula Ore Rama   DATE OF STUDY:  Mar 19, 2005   INDICATION FOR STUDY:  Hypersomnia with sleep apnea.  Epworth Sleepiness  Score 21/24, BMI 50, weight 280 pounds.   SLEEP ARCHITECTURE:  Total sleep time 357 minutes with sleep efficiency 82%.  Stage I was 14%, stage II 53%, stages III and IV 20%, REM was 13% of total  sleep time.  Sleep latency 16 minutes, REM latency 215 minutes, awake after  sleep onset 40 minutes, arousal index increased at 47.  No sleep medication  taken.   RESPIRATORY DATA:  Split-study protocol.  Respiratory disturbance index  (RDI, AHI) 86.2 obstructive events per hour indicating severe obstructive  sleep apnea/hypopnea syndrome before CPAP.  This included 188 obstructive  apneas and 47 hypopneas before CPAP.  Most events and most sleep were  recorded while supine.  REM RDI 6.7.  CPAP titrated successfully to 10 CWP,  RDI 3.3 per hour using a Fisher Paykel HC407 Mask with heated humidifier.   OXYGEN DATA:  Moderate to loud snoring with oxygen desaturation to a nadir  of 83% before CPAP.  After CPAP control saturation held 95% on room air.   CARDIAC DATA:  Normal sinus rhythm.   MOVEMENT/PARASOMNIA:  Occasional leg jerk.   IMPRESSION/RECOMMENDATION:  1.  Severe obstructive sleep apnea/hypopnea syndrome, respiratory      disturbance index 86.2 per hour with moderately loud snoring and oxygen      desaturation to 83%.  2.  Successful continuous positive airway pressure titration to 10 CWP,      respiratory disturbance index 3.3 per hour, using a Fisher Paykel HC407      Mask with heated humidifier.      CDY/MEDQ  D:  03/25/2005 10:49:08  T:  03/25/2005 12:11:44  Job:  [id number]

## 2011-03-23 NOTE — Discharge Summary (Signed)
NAMETERISSA, HAFFEY                 ACCOUNT NO.:  [id number]   MEDICAL RECORD NO.:  [id number]          PATIENT TYPE:  INP   LOCATION:  4708                         FACILITY:  [facility]   PHYSICIAN:  Mohan N. Sharyn Lull, M.D. DATE OF BIRTH:  1966/01/11   DATE OF ADMISSION:  11/11/2008  DATE OF DISCHARGE:  11/13/2008                               DISCHARGE SUMMARY   ADMITTING DIAGNOSES:  1. Chest pain, rule out myocardial infarction.  2. Uncontrolled hypertension.  3. Uncontrolled diabetes mellitus.  4. Morbid obesity.  5. Obstructive sleep apnea.  6. Obesity hypoventilation syndrome.  7. Positive family history of coronary artery disease.   DISCHARGE DIAGNOSES:  1. Status post chest pain, myocardial infarction ruled out, negative      Persantine Myoview.  2. Diabetes mellitus.  3. Hypertension.  4. Hypercholesteremia.  5. Morbid obesity.  6. Obstructive sleep apnea/obesity hypoventilation syndrome.  7. Positive family history of coronary artery disease.   DISCHARGE HOME MEDICATIONS:  1. Enteric-coated aspirin 81 mg 1 tablet daily.  2. Toprol-XL 50 mg 1 tablet daily.  3. Lisinopril 20 mg 1 tablet twice daily.  4. Simvastatin 20 mg 1 tablet daily at night.  5. Metformin 1000 mg 1 tablet twice daily with meals.  6. Nitrostat 0.4 mg sublingual use as directed.   DIET:  Low salt, low cholesterol, 1800 calories ADA diet.  The patient  has been advised to reduce weight.   The patient has been advised to monitor blood sugar and blood pressure  daily.  Increase activity slowly as tolerated.  Follow up with me in 1  week.   CONDITION AT DISCHARGE:  Stable.   BRIEF HISTORY AND HOSPITAL COURSE:  [name] is 45 year old white  female with past medical history significant for hypertension, non-  insulin-dependent diabetes mellitus, morbid obesity, obstructive sleep  apnea, obesity hypoventilation syndrome.  She came to the ER via EMS  complaining of retrosternal chest pressure, grade  8/10, associated with  numbness in both hands.  She took 1 aspirin with relief of chest pain.  Denies any nausea, vomiting, or diaphoresis.  Denies PND, orthopnea, or  leg swelling.  The patient gives history of exertional chest pain,  relieved with rest.  Denies any relation of chest pain to food,  breathing, or movement.   PAST MEDICAL HISTORY:  As above.   PAST SURGICAL HISTORY:  None.   SOCIAL HISTORY:  She is separated, has 1 child.  No history of smoking  or alcohol abuse.  She works for [employer]   ALLERGIES:  No known drug allergies.   MEDICATION AT HOME:  She is on lisinopril 15 mg p.o. b.i.d. and  metformin 500 mg p.o. b.i.d.   FAMILY HISTORY:  Father has coronary artery disease.  He had MI and had  coronary stents.  He is 65.  He is diabetic and hypertensive.  Uncle has  coronary artery disease,  had MI and subsequently required CABG.  Mother  is alive.  She is 61.   PHYSICAL EXAMINATION:  GENERAL:  She is alert, awake, and oriented x3,  in  no acute distress.  VITAL SIGNS:  Blood pressure was 156/79, pulse was 83.  HEENT:  Conjunctivae was pink.  NECK:  Supple.  No JVD, no bruit.  LUNGS:  Clear to auscultation without rhonchi or rales.  CARDIOVASCULAR:  S1 and S2 were normal.  There was soft systolic murmur  and S4 gallop.  ABDOMEN:  Soft, obese, nontender.  EXTREMITIES:  There is no clubbing, cyanosis, or edema.   LABORATORY DATA:  EKG showed normal sinus rhythm with minor T-wave  changes in the lateral leads.   Hemoglobin was 12.1, hematocrit 38, white count of 10.5.  Her 2 sets of  cardiac enzymes were negative.  C-reactive protein was 3.2, which was  slightly elevated.  TSH was 1.6, which was in normal range.  Her  cholesterol was 193, triglycerides were 188, HDL 37, LDL 118.  Homocystine level was 7.3.  Hemoglobin A1c was 9.0.  Sodium was 133,  potassium 3.7, glucose was 348.  Repeat blood sugar was 229, BUN 6,  creatinine 0.75.   BRIEF HOSPITAL COURSE:   The patient was admitted to telemetry unit.  MI  was ruled out by serial enzymes and EKG.  The patient subsequently  underwent Persantine Myoview on November 13, 2008, which showed no  evidence of reversible ischemia or infarction with normal LV wall motion  and normal left ventricular ejection fraction of 55%.  The patient did  not have any episodes of chest pain during the hospital stay.  The  patient was discharged home on above medications.  The patient will be  followed up in my office in 1 week.      Eduardo Osier. Sharyn Lull, M.D.  Electronically Signed     MNH/MEDQ  D:  01/05/2009  T:  01/06/2009  Job:  [id number]

## 2011-03-23 NOTE — Op Note (Signed)
[name], COPPIN                           ACCOUNT NO.:  [id number]   MEDICAL RECORD NO.:  [id number]                   PATIENT TYPE:  INP   LOCATION:  1829                                 FACILITY:  [facility]   PHYSICIAN:  Adolph Pollack, M.D.            DATE OF BIRTH:  [birth year]   DATE OF PROCEDURE:  11/27/2003  DATE OF DISCHARGE:                                 OPERATIVE REPORT   PREOPERATIVE DIAGNOSIS:  Right breast wound bleeding and hematoma.   POSTOPERATIVE DIAGNOSIS:  Right breast wound bleeding and hematoma.   PROCEDURE:  Right breast wound exploration, evacuation of hematoma, control  bleeding.   SURGEON:  Adolph Pollack, M.D.   ANESTHESIA:  General.   INDICATIONS FOR PROCEDURE:  Ms. [name] is a 46 year old female  who is status  post incision and drainage of a right breast abscess by Dr. Lindie Spruce on November 25, 2003. She returned to the emergency department for removal of packing  which was unsuccessful. I looked at the wound and then placed a small  Penrose drain in it just  so the skin edges would not close  too soon. She  is due to see Dr. Lindie Spruce back on Tuesday.   She went home and then had a coughing spell and began having significant  bleeding. Upon return to the emergency department, the wound was bleeding  with a large hematoma. She is now brought to the operating room for the  above procedure.   DESCRIPTION OF PROCEDURE:  She was placed in the supine position on the  operating table and a general anesthetic was administered. The right breast  was sterilely prepped and draped. Previous packing was removed from the  wound that was placed down in the emergency department.   I then explored the wound and evacuated the hematoma. I irrigated the wound  and noticed bleeding points at the right upper quadrant area of the wound as  well as the left inner lower quadrant and left upper outer quadrant. Also  just some generalized ooze from the bed of  the wound.  All this bleeding was  able to be controlled with the cautery. I copiously irrigated out the wound  and inspected it multiple times. Hemostasis was adequate. I then packed the  wound tightly with saline moistened Kerlix gauze. A bulky dressing  was  applied.   She tolerated the procedure well without any apparent complications. She was  taken to recovery in  satisfactory condition. We will keep her overnight for  observation. We will have her keep her appointment with Dr. Lindie Spruce on  Tuesday.                                               Adolph Pollack, M.D.  TJR/MEDQ  D:  11/27/2003  T:  11/27/2003  Job:  [id number]

## 2011-07-26 LAB — I-STAT 8, (EC8 V) (CONVERTED LAB)
BUN: 9
Chloride: 103
Glucose, Bld: 204 — ABNORMAL HIGH
Hemoglobin: 13.9
Potassium: 3.8
Sodium: 136
TCO2: 28
pH, Ven: 7.412 — ABNORMAL HIGH

## 2011-07-26 LAB — POCT CARDIAC MARKERS
Operator id: 133351
Troponin i, poc: <0.05

## 2011-07-26 LAB — POCT I-STAT CREATININE: Operator id: 133351

## 2011-08-06 LAB — POCT RAPID STREP A: Streptococcus, Group A Screen (Direct): NEGATIVE

## 2012-01-21 ENCOUNTER — Encounter (HOSPITAL_COMMUNITY): Payer: Self-pay | Admitting: *Deleted

## 2012-01-21 ENCOUNTER — Emergency Department (INDEPENDENT_AMBULATORY_CARE_PROVIDER_SITE_OTHER)
Admission: EM | Admit: 2012-01-21 | Discharge: 2012-01-21 | Disposition: A | Discharge status: Home / Self Care | Payer: BC Managed Care – PPO | Source: Home / Self Care | Attending: Family Medicine | Admitting: Family Medicine

## 2012-01-21 DIAGNOSIS — J329 Chronic sinusitis, unspecified: Secondary | ICD-10-CM

## 2012-01-21 HISTORY — DX: Essential (primary) hypertension: I10

## 2012-01-21 MED ORDER — FLUTICASONE PROPIONATE 50 MCG/ACT NA SUSP: 2.0000 | Freq: Every day | NASAL | Status: DC

## 2012-01-21 MED ORDER — AZITHROMYCIN 250 MG PO TABS: 250.0000 mg | ORAL_TABLET | Freq: Every day | ORAL | Status: AC

## 2012-01-21 NOTE — ED Provider Notes (Signed)
History     CSN: [id number]  Arrival date & time 01/21/12  4098   First MD Initiated Contact with Patient 01/21/12 [phone]      Chief Complaint  Patient presents with  . Cough    (Consider location/radiation/quality/duration/timing/severity/associated sxs/prior treatment) HPI Comments: [name] presents for evaluation of non-productive cough, sinus pressure, headaches, and purulent drainage from her nose. She reports feeling hot and cold, but is unsure of fever. She denies any sick contacts.   Patient is a 46 y.o. female presenting with URI. The history is provided by the patient.  URI The primary symptoms include headaches, ear pain, sore throat, cough and myalgias. The current episode started 2 days ago. This is a new problem. The problem has not changed since onset. The cough began 2 days ago. The cough is non-productive.  Symptoms associated with the illness include chills, plugged ear sensation, sinus pressure, congestion and rhinorrhea.    Past Medical History  Diagnosis Date  . Diabetes mellitus   . Hypertension     History reviewed. No pertinent past surgical history.  History reviewed. No pertinent family history.  History  Substance Use Topics  . Smoking status: Not on file  . Smokeless tobacco: Not on file  . Alcohol Use: Yes     socially    OB History    Grav Para Term Preterm Abortions TAB SAB Ect Mult Living                  Review of Systems  Constitutional: Positive for chills.  HENT: Positive for ear pain, congestion, sore throat, rhinorrhea and sinus pressure.   Eyes: Negative.   Respiratory: Positive for cough.   Cardiovascular: Negative.   Gastrointestinal: Negative.   Genitourinary: Negative.   Musculoskeletal: Positive for myalgias.  Skin: Negative.   Neurological: Positive for headaches.    Allergies  Review of patient's allergies indicates no known allergies.  Home Medications   Current Outpatient Rx  Name Route Sig Dispense Refill    . LISINOPRIL 30 MG PO TABS Oral Take 30 mg by mouth daily.    Marland Kitchen METFORMIN HCL 500 MG PO TABS Oral Take 500 mg by mouth 2 (two) times daily with a meal.    . AZITHROMYCIN 250 MG PO TABS Oral Take 1 tablet (250 mg total) by mouth daily. Take two tablets on first day, then one tablet each day for four days 6 tablet 0  . FLUTICASONE PROPIONATE 50 MCG/ACT NA SUSP Nasal Place 2 sprays into the nose daily. 16 g 2    BP 174/78  Pulse 87  Temp(Src) 98.3 F (36.8 C) (Oral)  Resp 20  SpO2 100%  LMP 12/24/2011  Physical Exam  Nursing note and vitals reviewed. Constitutional: She is oriented to person, place, and time. She appears well-developed and well-nourished.  HENT:  Head: Normocephalic and atraumatic.  Right Ear: Tympanic membrane is retracted.  Left Ear: Tympanic membrane is retracted.  Mouth/Throat: Uvula is midline, oropharynx is clear and moist and mucous membranes are normal.  Eyes: EOM are normal.  Neck: Normal range of motion.  Pulmonary/Chest: Effort normal and breath sounds normal. She has no decreased breath sounds. She has no wheezes. She has no rhonchi.  Musculoskeletal: Normal range of motion.  Neurological: She is alert and oriented to person, place, and time.  Skin: Skin is warm and dry.  Psychiatric: Her behavior is normal.    ED Course  Procedures (including critical care time)  Labs Reviewed - No data  to display No results found.   1. Rhinosinusitis       MDM  rx given for fluticasone and azithromycin        Renaee Munda, MD 01/21/12 1051

## 2012-01-21 NOTE — Discharge Instructions (Signed)
Use the prescribed nasal spray as directed. Take antibiotics as directed. Should you have fever, I recommend aggressive fever control with acetaminophen (Tylenol) and/or ibuprofen. You may use these together, alternating them every 4 hours, or individually, every 8 hours. For example, take acetaminophen 500 to 1000 mg at 12 noon, then 600 to 800 mg of ibuprofen at 4 pm, then acetaminophen at 8 pm, etc. Also, stay hydrated with clear liquids. Return to care should your symptoms not improve, or worsen in any way.

## 2012-01-21 NOTE — ED Notes (Signed)
Pt  Reports  Symptoms  Of  Cough  /  Congested body  Aches  As  Well  As  A      sorethroat     Body  Aches     X 2   Days  Pt  Reports  Cough is  Non  Productive  -  She  Is  Masked  And in a  Private  Room

## 2012-07-23 ENCOUNTER — Emergency Department (INDEPENDENT_AMBULATORY_CARE_PROVIDER_SITE_OTHER)
Admission: EM | Admit: 2012-07-23 | Discharge: 2012-07-23 | Disposition: A | Discharge status: Home / Self Care | Payer: BC Managed Care – PPO | Source: Home / Self Care | Attending: Family Medicine | Admitting: Family Medicine

## 2012-07-23 ENCOUNTER — Encounter (HOSPITAL_COMMUNITY): Payer: Self-pay

## 2012-07-23 DIAGNOSIS — IMO0002 Reserved for concepts with insufficient information to code with codable children: Secondary | ICD-10-CM

## 2012-07-23 DIAGNOSIS — S39012A Strain of muscle, fascia and tendon of lower back, initial encounter: Secondary | ICD-10-CM

## 2012-07-23 MED ORDER — CYCLOBENZAPRINE HCL 5 MG PO TABS: 5.0000 mg | ORAL_TABLET | Freq: Three times a day (TID) | ORAL | Status: DC | PRN

## 2012-07-23 MED ORDER — DICLOFENAC POTASSIUM 50 MG PO TABS: 50.0000 mg | ORAL_TABLET | Freq: Three times a day (TID) | ORAL | Status: DC

## 2012-07-23 NOTE — ED Provider Notes (Signed)
History     CSN: [id number]  Arrival date & time 07/23/12  1514   First MD Initiated Contact with Patient 07/23/12 1536      Chief Complaint  Patient presents with  . Back Pain    (Consider location/radiation/quality/duration/timing/severity/associated sxs/prior treatment) Patient is a 46 y.o. female presenting with back pain. The history is provided by the patient.  Back Pain  This is a new problem. The current episode started 2 days ago. The problem has not changed since onset.The pain is associated with no known injury. The pain is present in the thoracic spine. The quality of the pain is described as shooting. The pain does not radiate. The pain is mild. The symptoms are aggravated by certain positions. Pertinent negatives include no chest pain, no numbness, no abdominal pain and no paresthesias. Risk factors include obesity, lack of exercise and poor posture.    Past Medical History  Diagnosis Date  . Diabetes mellitus   . Hypertension     History reviewed. No pertinent past surgical history.  No family history on file.  History  Substance Use Topics  . Smoking status: Not on file  . Smokeless tobacco: Not on file  . Alcohol Use: No     socially    OB History    Grav Para Term Preterm Abortions TAB SAB Ect Mult Living                  Review of Systems  Constitutional: Negative.   Respiratory: Negative for shortness of breath, wheezing and stridor.   Cardiovascular: Negative for chest pain.  Gastrointestinal: Negative for abdominal pain.  Musculoskeletal: Positive for back pain.  Neurological: Negative for numbness and paresthesias.    Allergies  Review of patient's allergies indicates no known allergies.  Home Medications   Current Outpatient Rx  Name Route Sig Dispense Refill  . LISINOPRIL 30 MG PO TABS Oral Take 30 mg by mouth daily.    Marland Kitchen METFORMIN HCL 500 MG PO TABS Oral Take 500 mg by mouth 2 (two) times daily with a meal.    . CYCLOBENZAPRINE  HCL 5 MG PO TABS Oral Take 1 tablet (5 mg total) by mouth 3 (three) times daily as needed for muscle spasms. 30 tablet 0  . DICLOFENAC POTASSIUM 50 MG PO TABS Oral Take 1 tablet (50 mg total) by mouth 3 (three) times daily. For back pain 30 tablet 0  . FLUTICASONE PROPIONATE 50 MCG/ACT NA SUSP Nasal Place 2 sprays into the nose daily. 16 g 2    BP 162/105  Pulse 85  Temp 98 F (36.7 C) (Oral)  Resp 24  SpO2 98%  LMP 07/12/2012  Physical Exam  Nursing note and vitals reviewed. Constitutional: She is oriented to person, place, and time. She appears well-developed and well-nourished.  Neck: Normal range of motion. Neck supple.  Cardiovascular: Regular rhythm and normal heart sounds.   Pulmonary/Chest: Breath sounds normal.  Musculoskeletal: She exhibits tenderness.       Arms: Lymphadenopathy:    She has no cervical adenopathy.  Neurological: She is alert and oriented to person, place, and time.  Skin: Skin is warm and dry.    ED Course  Procedures (including critical care time)  Labs Reviewed - No data to display No results found.   1. Back strain       MDM          Linna Hoff, MD 07/23/12 [phone]

## 2012-07-23 NOTE — ED Notes (Signed)
C/o back and neck pain x 4 days

## 2012-07-26 ENCOUNTER — Encounter (HOSPITAL_COMMUNITY): Payer: Self-pay | Admitting: Emergency Medicine

## 2012-07-26 ENCOUNTER — Emergency Department (HOSPITAL_COMMUNITY): Payer: BC Managed Care – PPO

## 2012-07-26 ENCOUNTER — Inpatient Hospital Stay (HOSPITAL_COMMUNITY)
Admission: EM | Admit: 2012-07-26 | Discharge: 2012-07-28 | DRG: 134 | Disposition: A | Discharge status: Home / Self Care | Payer: BC Managed Care – PPO | Attending: Internal Medicine | Admitting: Internal Medicine

## 2012-07-26 DIAGNOSIS — M25519 Pain in unspecified shoulder: Secondary | ICD-10-CM | POA: Diagnosis present

## 2012-07-26 DIAGNOSIS — I10 Essential (primary) hypertension: Principal | ICD-10-CM

## 2012-07-26 DIAGNOSIS — I161 Hypertensive emergency: Secondary | ICD-10-CM

## 2012-07-26 DIAGNOSIS — E669 Obesity, unspecified: Secondary | ICD-10-CM

## 2012-07-26 DIAGNOSIS — R0602 Shortness of breath: Secondary | ICD-10-CM

## 2012-07-26 DIAGNOSIS — E1159 Type 2 diabetes mellitus with other circulatory complications: Secondary | ICD-10-CM | POA: Diagnosis present

## 2012-07-26 DIAGNOSIS — E119 Type 2 diabetes mellitus without complications: Secondary | ICD-10-CM

## 2012-07-26 DIAGNOSIS — Z6841 Body Mass Index (BMI) 40.0 and over, adult: Secondary | ICD-10-CM

## 2012-07-26 DIAGNOSIS — R079 Chest pain, unspecified: Secondary | ICD-10-CM

## 2012-07-26 LAB — COMPREHENSIVE METABOLIC PANEL
ALT: 16 U/L (ref 0–35)
Albumin: 3.3 g/dL — ABNORMAL LOW (ref 3.5–5.2)
Alkaline Phosphatase: 83 U/L (ref 39–117)
BUN: 10 mg/dL (ref 6–23)
Chloride: 95 mEq/L — ABNORMAL LOW (ref 96–112)
GFR calc Af Amer: >90 mL/min (ref >90)
Glucose, Bld: 265 mg/dL — ABNORMAL HIGH (ref 70–99)
Potassium: 4.4 mEq/L (ref 3.5–5.1)
Sodium: 133 mEq/L — ABNORMAL LOW (ref 135–145)
Total Bilirubin: 0.6 mg/dL (ref 0.3–1.2)
Total Protein: 6.9 g/dL (ref 6.0–8.3)

## 2012-07-26 LAB — GLUCOSE, CAPILLARY
Glucose-Capillary: 213 mg/dL — ABNORMAL HIGH (ref 70–99)
Glucose-Capillary: 193 mg/dL — ABNORMAL HIGH (ref 70–99)

## 2012-07-26 LAB — URINALYSIS, ROUTINE W REFLEX MICROSCOPIC
Bilirubin Urine: NEGATIVE
Glucose, UA: >1000 mg/dL — AB
Ketones, ur: NEGATIVE mg/dL
Protein, ur: NEGATIVE mg/dL
pH: 6 (ref 5.0–8.0)

## 2012-07-26 LAB — URINE MICROSCOPIC-ADD ON

## 2012-07-26 LAB — CBC WITH DIFFERENTIAL/PLATELET
Eosinophils Absolute: 0.2 10*3/uL (ref 0.0–0.7)
Hemoglobin: 12.3 g/dL (ref 12.0–15.0)
Lymphocytes Relative: 15 % (ref 12–46)
Lymphs Abs: 1.6 10*3/uL (ref 0.7–4.0)
MCH: 24.3 pg — ABNORMAL LOW (ref 26.0–34.0)
Monocytes Relative: 4 % (ref 3–12)
Neutro Abs: 8.5 10*3/uL — ABNORMAL HIGH (ref 1.7–7.7)
Neutrophils Relative %: 79 % — ABNORMAL HIGH (ref 43–77)
Platelets: 176 10*3/uL (ref 150–400)
RBC: 5.06 MIL/uL (ref 3.87–5.11)
WBC: 10.7 10*3/uL — ABNORMAL HIGH (ref 4.0–10.5)

## 2012-07-26 LAB — POCT I-STAT TROPONIN I

## 2012-07-26 MED ORDER — ASPIRIN 81 MG PO CHEW
324.0000 mg | CHEWABLE_TABLET | Freq: Once | ORAL | Status: AC
  Administered 2012-07-26: 324 mg via ORAL
  Filled 2012-07-26: qty 4

## 2012-07-26 MED ORDER — LABETALOL HCL 5 MG/ML IV SOLN
20.0000 mg | Freq: Once | INTRAVENOUS | Status: AC
  Administered 2012-07-26: 20 mg via INTRAVENOUS
  Filled 2012-07-26: qty 4

## 2012-07-26 MED ORDER — HYDROMORPHONE HCL PF 1 MG/ML IJ SOLN
1.0000 mg | Freq: Once | INTRAMUSCULAR | Status: AC
  Administered 2012-07-26: 1 mg via INTRAVENOUS
  Filled 2012-07-26: qty 1

## 2012-07-26 MED ORDER — FUROSEMIDE 10 MG/ML IJ SOLN
40.0000 mg | Freq: Once | INTRAMUSCULAR | Status: AC
  Administered 2012-07-26: 40 mg via INTRAVENOUS
  Filled 2012-07-26: qty 4

## 2012-07-26 MED ORDER — ONDANSETRON HCL 4 MG/2ML IJ SOLN
4.0000 mg | Freq: Once | INTRAMUSCULAR | Status: AC
  Administered 2012-07-26: 4 mg via INTRAVENOUS
  Filled 2012-07-26: qty 2

## 2012-07-26 MED ORDER — NITROGLYCERIN IN D5W 200-5 MCG/ML-% IV SOLN
5.0000 ug/min | INTRAVENOUS | Status: DC
  Administered 2012-07-26: 5 ug/min via INTRAVENOUS
  Filled 2012-07-26: qty 250

## 2012-07-26 NOTE — H&P (Signed)
Chief Complaint:  Shoulder left and left jaw pain  HPI: 46 yo female with h/o htn, dm, obesity comes in after experiencing left sided shoulder pain and left jaw pain earlier this pm which has spontaneously resolved with asa.  No ntg given yet.  Found to have sbp over 220 on arrival to ED.  She says normally her sbp is around 180 which is "normal" for her.  She also had some associated sob with this today and found to be mildly hypoxic on arrival with o2 sats 88% on RA.  Denies any le edema.  No recent illnesses.  No fevers/n/v/cough.  Review of Systems:  O/w neg  Past Medical History: Past Medical History  Diagnosis Date  . Diabetes mellitus   . Hypertension    History reviewed. No pertinent past surgical history.  Medications: Prior to Admission medications   Medication Sig Start Date End Date Taking? Authorizing Provider  cyclobenzaprine (FLEXERIL) 5 MG tablet Take 5 mg by mouth 3 (three) times daily as needed. Muscle spasms 07/23/12  Yes Linna Hoff, MD  diclofenac (CATAFLAM) 50 MG tablet Take 1 tablet (50 mg total) by mouth 3 (three) times daily. For back pain 07/23/12  Yes Linna Hoff, MD  lisinopril (PRINIVIL,ZESTRIL) 30 MG tablet Take 30 mg by mouth 2 (two) times daily.    Yes Historical Provider, MD  metFORMIN (GLUCOPHAGE) 500 MG tablet Take 500 mg by mouth 2 (two) times daily with a meal.   Yes Historical Provider, MD    Allergies:  No Known Allergies  Social History:  reports that she has never smoked. She does not have any smokeless tobacco history on file. She reports that she drinks alcohol. She reports that she uses illicit drugs (Marijuana).  Physical Exam: Filed Vitals:   07/26/12 2230 07/26/12 2245 07/26/12 2300 07/26/12 2315  BP: 266/132 232/118 217/106 200/80  Pulse: 99 82 81 79  Temp:      TempSrc:      Resp: 27 25 25 24   SpO2: 98% 93% 90% 93%   General appearance: alert, cooperative and no distress Neck: no JVD Lungs: clear to auscultation  bilaterally Heart: regular rate and rhythm, S1, S2 normal, no murmur, click, rub or gallop Abdomen: soft, non-tender; bowel sounds normal; no masses,  no organomegaly Extremities: extremities normal, atraumatic, no cyanosis or edema Pulses: 2+ and symmetric Skin: Skin color, texture, turgor normal. No rashes or lesions Neurologic: Grossly normal   Labs on Admission:   [facility] 07/26/12 1811  NA 133*  K 4.4  CL 95*  CO2 27  GLUCOSE 265*  BUN 10  CREATININE 0.57  CALCIUM 9.2  MG --  PHOS --    Basename 07/26/12 1811  AST 28  ALT 16  ALKPHOS 83  BILITOT 0.6  PROT 6.9  ALBUMIN 3.3*    Basename 07/26/12 1811  WBC 10.7*  NEUTROABS 8.5*  HGB 12.3  HCT 38.8  MCV 76.7*  PLT 176   Radiological Exams on Admission: Dg Chest 2 View  07/26/2012  *RADIOLOGY REPORT*  Clinical Data: Extremity weakness and pain  CHEST - 2 VIEW  Comparison: 07/21/2010  Findings: Cardiac enlargement.  There is pulmonary edema which appears similar to previous exam.  No focal airspace consolidation. No effusions.  IMPRESSION:  1.  Cardiac enlargement and edema.  Unchanged from previous exam.   Original Report Authenticated By: Rosealee Albee, M.D.    Ct Head Wo Contrast  07/26/2012  *RADIOLOGY REPORT*  Clinical Data: Hypertension, left  upper extremity weakness  CT HEAD WITHOUT CONTRAST  Technique:  Contiguous axial images were obtained from the base of the skull through the vertex without contrast.  Comparison: 07/22/2010  Findings: No evidence of parenchymal hemorrhage or extra-axial fluid collection. No mass lesion, mass effect, or midline shift.  No CT evidence of acute infarction.  Cerebral volume is age appropriate.  No ventriculomegaly.  The visualized paranasal sinuses are essentially clear. The mastoid air cells are unopacified.  No evidence of calvarial fracture.  IMPRESSION: Normal head CT.   Original Report Authenticated By: Charline Bills, M.D.     Assessment/Plan Present on Admission:    46 yo female with hypertensive emergency and presumed anginal equivalent .Hypertensive emergency .Chest pain .Diabetes mellitus type II .Obesity .SOB (shortness of breath)  Place in stepdown on ntg gtt for better bp control.  Romi.  Ck echo.  Has had persantine stress test in 2010 nml and stress echo in last 2 years also essentially normal.  Agree with one dose of lasix in ED.  Likely has component of mild chf diastolic dysfxn from chronically uncont bp.  Place on bblocker, statin.  Asa.  Cont ace inh.  Hold metformin and place on ssi.    [name] [phone] 07/26/2012, 11:55 PM

## 2012-07-26 NOTE — ED Notes (Signed)
Pt reports to ED from home. Pt states that she had a flu shot 9/14 and on 9/16 she started having L side weakness and not feeling well. Pt reports that she cannot lift L arm normally and cannot hold items in her L hand like normal. Pt states some SOB, but CP only when she breathes in. Pt denies N/V or fever.Pt room sats 93%. Pt triage BP- 233/104. Pt in NAD. Pt A&Ox4

## 2012-07-26 NOTE — ED Provider Notes (Signed)
6:13 PM  Date: 07/26/2012  Rate: 98  Rhythm: normal sinus rhythm  QRS Axis: normal  Intervals: normal  ST/T Wave abnormalities: Inverted T waves in lateral precordial leads.   Conduction Disutrbances:none  Narrative Interpretation: Borderline EKG  Old EKG Reviewed: changes noted--QTc has normalized since prior EKG of 07/22/2012.     Carleene Cooper III, MD 07/26/12 1816

## 2012-07-26 NOTE — ED Provider Notes (Signed)
History     CSN: [id number]  Arrival date & time 07/26/12  1659   First MD Initiated Contact with Patient 07/26/12 2143      Chief Complaint  Patient presents with  . Extremity Weakness    Left  . Extremity Pain    Left    (Consider location/radiation/quality/duration/timing/severity/associated sxs/prior treatment) HPI  Pt to the ER with complaints of left shoulder pain, arm tingling and shortness of breath. She is a [facility] patient and has chronic conditions of hypertension and diabetes with sleep apnea. She says that she is taking her medications as prescribed but admits that it has been 7 months since she has been seen and that her BP is sometimes over 200 at work but she did not worry about it. She endorses that she has been sweating and also has a headache. She denies having any generalized weakness. She has had cardiac work-up in the past which has been "normal" per patient.   Pts is in distress with a oxygen level of 86% and bp of 250/110. She is awake and alert but diaphoretic.  Past Medical History  Diagnosis Date  . Diabetes mellitus   . Hypertension     History reviewed. No pertinent past surgical history.  No family history on file.  History  Substance Use Topics  . Smoking status: Never Smoker   . Smokeless tobacco: Not on file  . Alcohol Use: Yes     socially    OB History    Grav Para Term Preterm Abortions TAB SAB Ect Mult Living                  Review of Systems   Review of Systems  Gen: no weight loss, fevers, chills, night sweats  Eyes: no discharge or drainage, no occular pain or visual changes  Nose: no epistaxis or rhinorrhea  Mouth: no dental pain, no sore throat  Neck: no neck pain  Lungs:No wheezing, coughing or hemoptysis CV: + chest pain, palpitations, dependent edema or orthopnea  Abd: no abdominal pain, nausea, vomiting  GU: no dysuria or gross hematuria  MSK:  No abnormalities  Neuro: + headache, no focal neurologic  deficits  Skin: no abnormalities Psyche: negative.    Allergies  Review of patient's allergies indicates no known allergies.  Home Medications   Current Outpatient Rx  Name Route Sig Dispense Refill  . CYCLOBENZAPRINE HCL 5 MG PO TABS Oral Take 5 mg by mouth 3 (three) times daily as needed. Muscle spasms    . DICLOFENAC POTASSIUM 50 MG PO TABS Oral Take 1 tablet (50 mg total) by mouth 3 (three) times daily. For back pain 30 tablet 0  . LISINOPRIL 30 MG PO TABS Oral Take 30 mg by mouth 2 (two) times daily.     Marland Kitchen METFORMIN HCL 500 MG PO TABS Oral Take 500 mg by mouth 2 (two) times daily with a meal.      BP 200/80  Pulse 79  Temp 98.9 F (37.2 C) (Oral)  Resp 24  SpO2 93%  LMP 07/12/2012  Physical Exam  Nursing note and vitals reviewed. Constitutional: She is oriented to person, place, and time. She appears well-developed and well-nourished. No distress.  HENT:  Head: Normocephalic and atraumatic.  Eyes: Pupils are equal, round, and reactive to light.  Neck: Normal range of motion. Neck supple.  Cardiovascular: Regular rhythm.  Tachycardia present.   Pulmonary/Chest: Effort normal. No respiratory distress. She has no wheezes. She has rales.  She exhibits no tenderness.  Abdominal: Soft.  Neurological: She is alert and oriented to person, place, and time. She has normal strength. No cranial nerve deficit (3-12 intact) or sensory deficit.  Skin: Skin is warm. She is diaphoretic.    ED Course  Procedures (including critical care time)  Labs Reviewed  CBC WITH DIFFERENTIAL - Abnormal; Notable for the following:    WBC 10.7 (*)     MCV 76.7 (*)     MCH 24.3 (*)     RDW 16.6 (*)     Neutrophils Relative 79 (*)     Neutro Abs 8.5 (*)     All other components within normal limits  COMPREHENSIVE METABOLIC PANEL - Abnormal; Notable for the following:    Sodium 133 (*)     Chloride 95 (*)     Glucose, Bld 265 (*)     Albumin 3.3 (*)     All other components within normal  limits  URINALYSIS, ROUTINE W REFLEX MICROSCOPIC - Abnormal; Notable for the following:    Glucose, UA >1000 (*)     Nitrite POSITIVE (*)     All other components within normal limits  URINE MICROSCOPIC-ADD ON - Abnormal; Notable for the following:    Squamous Epithelial / LPF FEW (*)     Bacteria, UA FEW (*)     All other components within normal limits  GLUCOSE, CAPILLARY - Abnormal; Notable for the following:    Glucose-Capillary 213 (*)     All other components within normal limits  GLUCOSE, CAPILLARY - Abnormal; Notable for the following:    Glucose-Capillary 193 (*)     All other components within normal limits  POCT PREGNANCY, URINE  POCT I-STAT TROPONIN I  TROPONIN I  PRO B NATRIURETIC PEPTIDE   Dg Chest 2 View  07/26/2012  *RADIOLOGY REPORT*  Clinical Data: Extremity weakness and pain  CHEST - 2 VIEW  Comparison: 07/21/2010  Findings: Cardiac enlargement.  There is pulmonary edema which appears similar to previous exam.  No focal airspace consolidation. No effusions.  IMPRESSION:  1.  Cardiac enlargement and edema.  Unchanged from previous exam.   Original Report Authenticated By: Rosealee Albee, M.D.    Ct Head Wo Contrast  07/26/2012  *RADIOLOGY REPORT*  Clinical Data: Hypertension, left upper extremity weakness  CT HEAD WITHOUT CONTRAST  Technique:  Contiguous axial images were obtained from the base of the skull through the vertex without contrast.  Comparison: 07/22/2010  Findings: No evidence of parenchymal hemorrhage or extra-axial fluid collection. No mass lesion, mass effect, or midline shift.  No CT evidence of acute infarction.  Cerebral volume is age appropriate.  No ventriculomegaly.  The visualized paranasal sinuses are essentially clear. The mastoid air cells are unopacified.  No evidence of calvarial fracture.  IMPRESSION: Normal head CT.   Original Report Authenticated By: Charline Bills, M.D.      1. Hypertensive emergency   2. Chest pain       MDM    Hypertensive emergency with chest pain. Question heart failure. Normal Troponin, no white count of pneumonia. No fever.    CRITICAL CARE Performed by: Dorthula Matas   Total critical care time: 45 minutes  Critical care time was exclusive of separately billable procedures and treating other patients.  Critical care was necessary to treat or prevent imminent or life-threatening deterioration.  Critical care was time spent personally by me on the following activities: development of treatment plan with patient and/or surrogate as  well as nursing, discussions with consultants, evaluation of patient's response to treatment, examination of patient, obtaining history from patient or surrogate, ordering and performing treatments and interventions, ordering and review of laboratory studies, ordering and review of radiographic studies, pulse oximetry and re-evaluation of patient's condition.  Lasix 20mg  IV ordered, Nasal cannula 2L required, Nitro Drip started. Head CT normal. Pain free after dose of Fentanyl.  To see EKG see Dr. Rema Fendt note.  Patient care shared visit with Dr. Sunnie Nielsen.   Patient admitted to [facility], Step-down, inpatient   Dorthula Matas, [place] 07/26/12 2338

## 2012-07-27 ENCOUNTER — Inpatient Hospital Stay (HOSPITAL_COMMUNITY): Payer: BC Managed Care – PPO

## 2012-07-27 ENCOUNTER — Encounter (HOSPITAL_COMMUNITY): Payer: Self-pay | Admitting: *Deleted

## 2012-07-27 LAB — GLUCOSE, CAPILLARY
Glucose-Capillary: 172 mg/dL — ABNORMAL HIGH (ref 70–99)
Glucose-Capillary: 231 mg/dL — ABNORMAL HIGH (ref 70–99)
Glucose-Capillary: 202 mg/dL — ABNORMAL HIGH (ref 70–99)

## 2012-07-27 LAB — TROPONIN I
Troponin I: <0.3 ng/mL
Troponin I: <0.3 ng/mL

## 2012-07-27 LAB — LIPID PANEL
Cholesterol: 171 mg/dL (ref 0–200)
LDL Cholesterol: 99 mg/dL (ref 0–99)
Triglycerides: 172 mg/dL — ABNORMAL HIGH (ref <150)

## 2012-07-27 LAB — PRO B NATRIURETIC PEPTIDE: Pro B Natriuretic peptide (BNP): 510.4 pg/mL — ABNORMAL HIGH (ref 0–125)

## 2012-07-27 LAB — MRSA PCR SCREENING: MRSA by PCR: NEGATIVE

## 2012-07-27 MED ORDER — LISINOPRIL 20 MG PO TABS
30.0000 mg | ORAL_TABLET | Freq: Two times a day (BID) | ORAL | Status: DC
  Administered 2012-07-27 – 2012-07-28 (×4): 30 mg via ORAL
  Filled 2012-07-27 (×5): qty 1

## 2012-07-27 MED ORDER — INSULIN ASPART 100 UNIT/ML ~~LOC~~ SOLN
0.0000 [IU] | Freq: Three times a day (TID) | SUBCUTANEOUS | Status: DC
  Administered 2012-07-27 (×2): 3 [IU] via SUBCUTANEOUS
  Administered 2012-07-27 – 2012-07-28 (×2): 2 [IU] via SUBCUTANEOUS
  Administered 2012-07-28: 1 [IU] via SUBCUTANEOUS
  Administered 2012-07-28: 3 [IU] via SUBCUTANEOUS

## 2012-07-27 MED ORDER — METOPROLOL TARTRATE 50 MG PO TABS
50.0000 mg | ORAL_TABLET | Freq: Two times a day (BID) | ORAL | Status: DC
  Administered 2012-07-27 – 2012-07-28 (×4): 50 mg via ORAL
  Filled 2012-07-27 (×5): qty 1

## 2012-07-27 MED ORDER — NITROGLYCERIN 0.4 MG SL SUBL: 0.4000 mg | SUBLINGUAL_TABLET | SUBLINGUAL | Status: DC | PRN

## 2012-07-27 MED ORDER — ASPIRIN 81 MG PO CHEW
324.0000 mg | CHEWABLE_TABLET | ORAL | Status: AC
  Administered 2012-07-27: 324 mg via ORAL
  Filled 2012-07-27: qty 4

## 2012-07-27 MED ORDER — ENOXAPARIN SODIUM 40 MG/0.4ML ~~LOC~~ SOLN
40.0000 mg | SUBCUTANEOUS | Status: DC
  Administered 2012-07-27: 40 mg via SUBCUTANEOUS
  Filled 2012-07-27 (×2): qty 0.4

## 2012-07-27 MED ORDER — HYDROCODONE-ACETAMINOPHEN 5-325 MG PO TABS
1.0000 | ORAL_TABLET | Freq: Four times a day (QID) | ORAL | Status: DC | PRN
  Administered 2012-07-27 – 2012-07-28 (×2): 2 via ORAL
  Filled 2012-07-27 (×2): qty 2

## 2012-07-27 MED ORDER — ONDANSETRON HCL 4 MG/2ML IJ SOLN: 4.0000 mg | Freq: Four times a day (QID) | INTRAMUSCULAR | Status: DC | PRN

## 2012-07-27 MED ORDER — ASPIRIN EC 81 MG PO TBEC
81.0000 mg | DELAYED_RELEASE_TABLET | Freq: Every day | ORAL | Status: DC
  Administered 2012-07-27 – 2012-07-28 (×2): 81 mg via ORAL
  Filled 2012-07-27 (×2): qty 1

## 2012-07-27 MED ORDER — ENOXAPARIN SODIUM 120 MG/0.8ML ~~LOC~~ SOLN
1.0000 mg/kg | Freq: Two times a day (BID) | SUBCUTANEOUS | Status: DC
  Administered 2012-07-27 (×2): 115 mg via SUBCUTANEOUS
  Filled 2012-07-27 (×3): qty 0.8

## 2012-07-27 MED ORDER — ASPIRIN 300 MG RE SUPP
300.0000 mg | RECTAL | Status: AC
  Filled 2012-07-27: qty 1

## 2012-07-27 MED ORDER — ACETAMINOPHEN 325 MG PO TABS
650.0000 mg | ORAL_TABLET | ORAL | Status: DC | PRN
  Administered 2012-07-27: 650 mg via ORAL
  Filled 2012-07-27: qty 2

## 2012-07-27 MED ORDER — TROLAMINE SALICYLATE 10 % EX CREA
TOPICAL_CREAM | Freq: Two times a day (BID) | CUTANEOUS | Status: DC | PRN
  Filled 2012-07-27: qty 85

## 2012-07-27 MED ORDER — ATORVASTATIN CALCIUM 20 MG PO TABS
20.0000 mg | ORAL_TABLET | Freq: Every day | ORAL | Status: DC
  Administered 2012-07-27 – 2012-07-28 (×2): 20 mg via ORAL
  Filled 2012-07-27 (×2): qty 1

## 2012-07-27 MED ORDER — MUSCLE RUB 10-15 % EX CREA
TOPICAL_CREAM | Freq: Two times a day (BID) | CUTANEOUS | Status: DC | PRN
  Administered 2012-07-27: 1 via TOPICAL
  Filled 2012-07-27: qty 85

## 2012-07-27 MED ORDER — SODIUM CHLORIDE 0.9 % IJ SOLN
3.0000 mL | Freq: Two times a day (BID) | INTRAMUSCULAR | Status: DC
  Administered 2012-07-27 – 2012-07-28 (×3): 3 mL via INTRAVENOUS

## 2012-07-27 MED ORDER — SODIUM CHLORIDE 0.9 % IV SOLN: 250.0000 mL | INTRAVENOUS | Status: DC | PRN

## 2012-07-27 MED ORDER — SODIUM CHLORIDE 0.9 % IJ SOLN: 3.0000 mL | INTRAMUSCULAR | Status: DC | PRN

## 2012-07-27 MED ORDER — NITROGLYCERIN IN D5W 200-5 MCG/ML-% IV SOLN
3.0000 ug/min | INTRAVENOUS | Status: DC
  Administered 2012-07-27: 10 ug/min via INTRAVENOUS
  Administered 2012-07-27: 20 ug/min via INTRAVENOUS

## 2012-07-27 NOTE — ED Provider Notes (Signed)
Medical screening examination/treatment/procedure(s) were conducted as a shared visit with non-physician practitioner(s) and myself.  I personally evaluated the patient during the encounter.  PT with some posterior rales, hypertensive and diaphoretic with CP and SOB. Started on NTG drip, has new ECG changes, plan step down ICU.  CP improved with IV fentanyl and BP normalizing NTG drip. Serial evaluations.   Sunnie Nielsen, MD 07/27/12 [phone]

## 2012-07-27 NOTE — Progress Notes (Signed)
[facility] PROGRESS NOTE  [name] QMV:[id number] DOB: Oct 18, 1966 DOA: 07/26/2012 PCP: No primary provider on file.  Assessment/Plan: Principal Problem:  *Hypertensive emergency Active Problems:  Chest pain  Diabetes mellitus type II  Obesity  SOB (shortness of breath)  1. HTN urgency- had been on nitro gtt but was d/c'd when she got her PO BP medications, echo pending, transfer out of step down to tele bed 2. Chest pain- more in the shoulder than chest and pain is reproducible with palpation, add Aspercreme to see if it helps 3. Obesity 4. DM- SSI, holding metformin, diabetic diet  Code Status: full Family Communication: patient at bedside Disposition Plan: home when better    HPI/Subjective: Patient feeling better C/o muscle pain in left shoulder No SOB  Objective: Filed Vitals:   07/27/12 0530 07/27/12 0545 07/27/12 0600 07/27/12 0615  BP: 138/81 119/69 122/68 123/65  Pulse: 61 60 58 57  Temp:      TempSrc:      Resp: 21 20 17 20   Height:      Weight:      SpO2: 100% 100% 99% 98%    Intake/Output Summary (Last 24 hours) at 07/27/12 0858 Last data filed at 07/27/12 0600  Gross per 24 hour  Intake 144.22 ml  Output    300 ml  Net -155.78 ml   Filed Weights   07/27/12 0030  Weight: 113.1 kg (249 lb 5.4 oz)    Exam:   General:  A+Ox3, NAD  Cardiovascular: rrr  Respiratory: dec BS B/L  Abdomen: +BS, soft, NT/ND  Data Reviewed: Basic Metabolic Panel:  Lab 07/26/12 2841  NA 133*  K 4.4  CL 95*  CO2 27  GLUCOSE 265*  BUN 10  CREATININE 0.57  CALCIUM 9.2  MG --  PHOS --   Liver Function Tests:  Lab 07/26/12 1811  AST 28  ALT 16  ALKPHOS 83  BILITOT 0.6  PROT 6.9  ALBUMIN 3.3*   No results found for this basename: LIPASE:5,AMYLASE:5 in the last 168 hours No results found for this basename: AMMONIA:5 in the last 168 hours CBC:  Lab 07/26/12 1811  WBC 10.7*  NEUTROABS 8.5*  HGB 12.3  HCT 38.8  MCV 76.7*  PLT 176    Cardiac Enzymes:  Lab 07/27/12 0659 07/27/12 0143 07/26/12 2321  CKTOTAL -- -- --  CKMB -- -- --  CKMBINDEX -- -- --  TROPONINI <0.30 <0.30 <0.30   BNP (last 3 results)  Basename 07/26/12 2321  PROBNP 510.4*   CBG:  Lab 07/27/12 0808 07/26/12 2203 07/26/12 2121  GLUCAP 172* 193* 213*    Recent Results (from the past 240 hour(s))  MRSA PCR SCREENING     Status: Normal   Collection Time   07/27/12 12:36 AM      Component Value Range Status Comment   MRSA by PCR NEGATIVE  NEGATIVE Final      Studies: Dg Chest 2 View  07/26/2012  *RADIOLOGY REPORT*  Clinical Data: Extremity weakness and pain  CHEST - 2 VIEW  Comparison: 07/21/2010  Findings: Cardiac enlargement.  There is pulmonary edema which appears similar to previous exam.  No focal airspace consolidation. No effusions.  IMPRESSION:  1.  Cardiac enlargement and edema.  Unchanged from previous exam.   Original Report Authenticated By: Rosealee Albee, M.D.    Ct Head Wo Contrast  07/26/2012  *RADIOLOGY REPORT*  Clinical Data: Hypertension, left upper extremity weakness  CT HEAD WITHOUT CONTRAST  Technique:  Contiguous axial images were obtained from the base of the skull through the vertex without contrast.  Comparison: 07/22/2010  Findings: No evidence of parenchymal hemorrhage or extra-axial fluid collection. No mass lesion, mass effect, or midline shift.  No CT evidence of acute infarction.  Cerebral volume is age appropriate.  No ventriculomegaly.  The visualized paranasal sinuses are essentially clear. The mastoid air cells are unopacified.  No evidence of calvarial fracture.  IMPRESSION: Normal head CT.   Original Report Authenticated By: Charline Bills, M.D.     Scheduled Meds:   . aspirin  324 mg Oral Once  . aspirin  324 mg Oral NOW   Or  . aspirin  300 mg Rectal NOW  . aspirin EC  81 mg Oral Daily  . atorvastatin  20 mg Oral q1800  . enoxaparin (LOVENOX) injection  1 mg/kg Subcutaneous BID  . furosemide  40  mg Intravenous Once  .  HYDROmorphone (DILAUDID) injection  1 mg Intravenous Once  . insulin aspart  0-9 Units Subcutaneous TID WC  . labetalol  20 mg Intravenous Once  . lisinopril  30 mg Oral BID  . metoprolol tartrate  50 mg Oral BID  . ondansetron  4 mg Intravenous Once  . sodium chloride  3 mL Intravenous Q12H   Continuous Infusions:   . DISCONTD: nitroGLYCERIN 5 mcg/min (07/26/12 2333)  . DISCONTD: nitroGLYCERIN Stopped (07/27/12 0514)    Principal Problem:  *Hypertensive emergency Active Problems:  Chest pain  Diabetes mellitus type II  Obesity  SOB (shortness of breath)    Time spent: 35    [facility], Marjie Chea  [facility] Pager [phone].  07/27/2012, 8:58 AM  LOS: 1 day

## 2012-07-28 ENCOUNTER — Inpatient Hospital Stay (HOSPITAL_COMMUNITY): Payer: BC Managed Care – PPO

## 2012-07-28 LAB — GLUCOSE, CAPILLARY
Glucose-Capillary: 148 mg/dL — ABNORMAL HIGH (ref 70–99)
Glucose-Capillary: 152 mg/dL — ABNORMAL HIGH (ref 70–99)

## 2012-07-28 MED ORDER — ATORVASTATIN CALCIUM 20 MG PO TABS: 20.0000 mg | ORAL_TABLET | Freq: Every day | ORAL | Status: DC

## 2012-07-28 MED ORDER — UNABLE TO FIND: 1.0000 | Freq: Once | Status: DC

## 2012-07-28 MED ORDER — ASPIRIN 81 MG PO TBEC: 81.0000 mg | DELAYED_RELEASE_TABLET | Freq: Every day | ORAL | Status: DC

## 2012-07-28 MED ORDER — METOPROLOL TARTRATE 50 MG PO TABS: 50.0000 mg | ORAL_TABLET | Freq: Two times a day (BID) | ORAL | Status: DC

## 2012-07-28 MED ORDER — HYDROCODONE-ACETAMINOPHEN 5-325 MG PO TABS: 1.0000 | ORAL_TABLET | Freq: Four times a day (QID) | ORAL | Status: DC | PRN

## 2012-07-28 NOTE — Progress Notes (Signed)
Pt has not voided since dayshift on 07/27/12.  Encouraged pt multiple times throughout the night to urinate, but pt denies urge to urinate.  Notified MD on call, Benjamine Mola and MD Samtani.  Awaiting any new orders. Passed on information to dayshift RN.

## 2012-07-28 NOTE — Progress Notes (Signed)
  Echocardiogram 2D Echocardiogram has been performed.  [name], [facility] 07/28/2012, 10:21 AM

## 2012-07-28 NOTE — Discharge Summary (Signed)
Physician Discharge Summary  [name] HQI:[id number] DOB: [birth year] DOA: 07/26/2012  PCP: No primary provider on file.  Admit date: 07/26/2012 Discharge date: 07/28/2012  Recommendations for Outpatient Follow-up:  1. Outpatient PT for cervical radiculopathy  Discharge Diagnoses:  Principal Problem:  *Hypertensive emergency Active Problems:  Chest pain  Diabetes mellitus type II  Obesity  SOB (shortness of breath)   Discharge Condition: improved  Diet recommendation: cardiac/diabetic  Filed Weights   07/27/12 0030  Weight: 113.1 kg (249 lb 5.4 oz)    History of present illness:  46 yo female with h/o htn, dm, obesity comes in after experiencing left sided shoulder pain and left jaw pain earlier this pm which has spontaneously resolved with asa. No ntg given yet. Found to have sbp over 220 on arrival to ED. She says normally her sbp is around 180 which is "normal" for her. She also had some associated sob with this today and found to be mildly hypoxic on arrival with o2 sats 88% on RA. Denies any le edema. No recent illnesses. No fevers/n/v/cough   Hospital Course:  1. HTN urgency- had been on nitro gtt but was d/c'd when she got her PO BP medications, echo ok, hBP seems to be due to patient missing her medications 2. Chest pain- more in the shoulder/neck than chest and pain is reproducible with palpation, CE negative, echo ok, arranged for outpatient physical therapy for cervical radiculopathy 3. Obesity- encourage weight loss 4. DM- SSI, holding metformin, diabetic diet   Discharge Exam: Filed Vitals:   07/27/12 2114 07/27/12 2126 07/28/12 0519 07/28/12 1300  BP: 176/85  145/87 135/67  Pulse: 72 80 65 69  Temp: 98.7 F (37.1 C)  97.7 F (36.5 C) 98 F (36.7 C)  TempSrc: Oral  Oral   Resp: 16  16 15   Height:      Weight:      SpO2: 95%  95% 96%    General: A+Ox3, NAD Cardiovascular: rrr Respiratory: clear anterior, no wheezing  Discharge Instructions        Discharge Orders    Future Orders Please Complete By Expires   Diet - low sodium heart healthy      Diet Carb Modified      Increase activity slowly      Discharge instructions      Comments:   Follow up with outpatient PT   Driving Restrictions      Comments:   No driving while taking pain medications   Discharge instructions      Comments:   FLP and LFTs in 4 weeks       Medication List     As of 07/28/2012  5:56 PM    STOP taking these medications         diclofenac 50 MG tablet   Commonly known as: CATAFLAM      TAKE these medications         aspirin 81 MG EC tablet   Take 1 tablet (81 mg total) by mouth daily.      atorvastatin 20 MG tablet   Commonly known as: LIPITOR   Take 1 tablet (20 mg total) by mouth daily at 6 PM.      cyclobenzaprine 5 MG tablet   Commonly known as: FLEXERIL   Take 5 mg by mouth 3 (three) times daily as needed. Muscle spasms      HYDROcodone-acetaminophen 5-325 MG per tablet   Commonly known as: NORCO/VICODIN   Take 1 tablet  by mouth every 6 (six) hours as needed.      lisinopril 30 MG tablet   Commonly known as: PRINIVIL,ZESTRIL   Take 30 mg by mouth 2 (two) times daily.      metFORMIN 500 MG tablet   Commonly known as: GLUCOPHAGE   Take 500 mg by mouth 2 (two) times daily with a meal.      metoprolol 50 MG tablet   Commonly known as: LOPRESSOR   Take 1 tablet (50 mg total) by mouth 2 (two) times daily.      UNABLE TO FIND   1 applicator by Other route once. Physical therapy for cervical radiculopathy   Number of treatments and frequency per PT recomendations            The results of significant diagnostics from this hospitalization (including imaging, microbiology, ancillary and laboratory) are listed below for reference.    Significant Diagnostic Studies: Dg Chest 2 View  07/26/2012  *RADIOLOGY REPORT*  Clinical Data: Extremity weakness and pain  CHEST - 2 VIEW  Comparison: 07/21/2010  Findings: Cardiac  enlargement.  There is pulmonary edema which appears similar to previous exam.  No focal airspace consolidation. No effusions.  IMPRESSION:  1.  Cardiac enlargement and edema.  Unchanged from previous exam.   Original Report Authenticated By: Rosealee Albee, M.D.    Dg Cervical Spine Complete  07/28/2012  *RADIOLOGY REPORT*  Clinical Data: Left neck pain radiating to arm  CERVICAL SPINE - COMPLETE 4+ VIEW  Comparison: None.  Findings: Focal spine is visualized to C6-7 on the lateral view and C7-T1 on the lateral swimmer's view.  Mild straightening of the cervical spine, positive positional.  No evidence of fracture or dislocation.  Vertebral body heights are maintained.  The dens appears intact.  Lateral masses of C1 are symmetric.  No prevertebral soft tissue swelling.  Mild degenerative changes at the C6-7.  Mild left neural foraminal narrowing at C6-7.  Visualized lung apices are clear.  IMPRESSION: No fracture or dislocation is seen.  Mild degenerative changes with left neural foraminal narrowing at C6-7.   Original Report Authenticated By: Charline Bills, M.D.    Ct Head Wo Contrast  07/26/2012  *RADIOLOGY REPORT*  Clinical Data: Hypertension, left upper extremity weakness  CT HEAD WITHOUT CONTRAST  Technique:  Contiguous axial images were obtained from the base of the skull through the vertex without contrast.  Comparison: 07/22/2010  Findings: No evidence of parenchymal hemorrhage or extra-axial fluid collection. No mass lesion, mass effect, or midline shift.  No CT evidence of acute infarction.  Cerebral volume is age appropriate.  No ventriculomegaly.  The visualized paranasal sinuses are essentially clear. The mastoid air cells are unopacified.  No evidence of calvarial fracture.  IMPRESSION: Normal head CT.   Original Report Authenticated By: Charline Bills, M.D.    Dg Shoulder Left  07/27/2012  *RADIOLOGY REPORT*  Clinical Data: 46 year old female with left shoulder pain.  LEFT SHOULDER -  2+ VIEW  Comparison: None.  Findings: No glenohumeral joint dislocation.  Proximal left humerus and left clavicle appear intact.  Left scapula appears intact. Negative visualized left ribs.  IMPRESSION: No acute osseous abnormality identified about the left shoulder.   Original Report Authenticated By: Harley Hallmark, M.D.     Microbiology: Recent Results (from the past 240 hour(s))  MRSA PCR SCREENING     Status: Normal   Collection Time   07/27/12 12:36 AM      Component Value Range Status  Comment   MRSA by PCR NEGATIVE  NEGATIVE Final      Labs: Basic Metabolic Panel:  Lab 07/26/12 1610  NA 133*  K 4.4  CL 95*  CO2 27  GLUCOSE 265*  BUN 10  CREATININE 0.57  CALCIUM 9.2  MG --  PHOS --   Liver Function Tests:  Lab 07/26/12 1811  AST 28  ALT 16  ALKPHOS 83  BILITOT 0.6  PROT 6.9  ALBUMIN 3.3*   No results found for this basename: LIPASE:5,AMYLASE:5 in the last 168 hours No results found for this basename: AMMONIA:5 in the last 168 hours CBC:  Lab 07/26/12 1811  WBC 10.7*  NEUTROABS 8.5*  HGB 12.3  HCT 38.8  MCV 76.7*  PLT 176   Cardiac Enzymes:  Lab 07/27/12 1248 07/27/12 0659 07/27/12 0143 07/26/12 2321  CKTOTAL -- -- -- --  CKMB -- -- -- --  CKMBINDEX -- -- -- --  TROPONINI <0.30 <0.30 <0.30 <0.30   BNP: BNP (last 3 results)  Basename 07/26/12 2321  PROBNP 510.4*   CBG:  Lab 07/28/12 1735 07/28/12 1206 07/28/12 0759 07/27/12 2113 07/27/12 1748  GLUCAP 152* 212* 148* 223* 202*    Time coordinating discharge: 35 minutes  Signed:  Benjamine Mola, Naydeline Morace  [facility] 07/28/2012, 5:56 PM

## 2012-07-28 NOTE — Progress Notes (Signed)
Inpatient Diabetes Program Recommendations  AACE/ADA: New Consensus Statement on Inpatient Glycemic Control (2013)  Target Ranges:  Prepandial:   less than 140 mg/dL      Peak postprandial:   less than 180 mg/dL (1-2 hours)      Critically ill patients:  140 - 180 mg/dL   Reason for Visit: Hyperglycemia  Inpatient Diabetes Program Recommendations Correction (SSI): Currently receiving sensitive correction scale tid. Insulin - Meal Coverage: Currently receiving no meal coverage.  Eating 100%.  All CBG's greater than 200 mg/dl except for before breakfast.  Request MD order for meal coverage 3 units tid with meals in addition to current correction scale. HgbA1C: No recent Hgb A1C.  Request MD order for one. Diet: CHO Modified Medium diet ordered.  Eating 100%.  Note:  Results for MALAIYAH, ACHORN (MRN [id number]) as of 07/28/2012 13:25  Ref. Range 07/27/2012 08:08 07/27/2012 12:57 07/27/2012 17:48 07/27/2012 21:13 07/28/2012 07:59 07/28/2012 12:06  Glucose-Capillary Latest Range: 70-99 mg/dL 409 (H) 811 (H) 914 (H) 223 (H) 148 (H) 212 (H)   Thank you. Sharniece Gibbon S. Elsie Lincoln, RN, CNS, CDE  [phone])

## 2012-08-11 ENCOUNTER — Encounter: Payer: Self-pay | Admitting: Family Medicine

## 2012-08-11 ENCOUNTER — Ambulatory Visit (INDEPENDENT_AMBULATORY_CARE_PROVIDER_SITE_OTHER): Payer: BC Managed Care – PPO | Admitting: Family Medicine

## 2012-08-11 VITALS — BP 180/113 | HR 51 | Temp 97.9°F | Ht 63.0 in | Wt 248.0 lb

## 2012-08-11 DIAGNOSIS — I10 Essential (primary) hypertension: Secondary | ICD-10-CM

## 2012-08-11 DIAGNOSIS — E1169 Type 2 diabetes mellitus with other specified complication: Secondary | ICD-10-CM | POA: Insufficient documentation

## 2012-08-11 DIAGNOSIS — M542 Cervicalgia: Secondary | ICD-10-CM

## 2012-08-11 DIAGNOSIS — E785 Hyperlipidemia, unspecified: Secondary | ICD-10-CM

## 2012-08-11 DIAGNOSIS — E119 Type 2 diabetes mellitus without complications: Secondary | ICD-10-CM

## 2012-08-11 MED ORDER — CYCLOBENZAPRINE HCL 10 MG PO TABS: 10.0000 mg | ORAL_TABLET | Freq: Three times a day (TID) | ORAL | Status: DC | PRN

## 2012-08-11 MED ORDER — MELOXICAM 15 MG PO TABS: 15.0000 mg | ORAL_TABLET | Freq: Every day | ORAL | Status: DC

## 2012-08-11 MED ORDER — ATORVASTATIN CALCIUM 20 MG PO TABS: 20.0000 mg | ORAL_TABLET | Freq: Every day | ORAL | Status: DC

## 2012-08-11 MED ORDER — METFORMIN HCL 500 MG PO TABS: 500.0000 mg | ORAL_TABLET | Freq: Two times a day (BID) | ORAL | Status: DC

## 2012-08-11 MED ORDER — LISINOPRIL-HYDROCHLOROTHIAZIDE 20-12.5 MG PO TABS: 2.0000 | ORAL_TABLET | Freq: Every day | ORAL | Status: DC

## 2012-08-11 MED ORDER — METOPROLOL TARTRATE 50 MG PO TABS: 50.0000 mg | ORAL_TABLET | Freq: Two times a day (BID) | ORAL | Status: DC

## 2012-08-11 NOTE — Patient Instructions (Addendum)
Follow-up in 7-10 days  Will likely add new BP medicine and increase diabetes medicine  Will order MRI for your neck and send you to physical therapy

## 2012-08-11 NOTE — Progress Notes (Signed)
  Subjective:    Patient ID: [name], female    DOB: [birth year], 46 y.o.   MRN: [id number]  HPI Here for hospital follow-up and to establish care as a new patient.  Left shoulder pain:  Started 2 days after she got a flu shot- pain started 9/21 per patient.   Went to urgent care and was prescribed pain medicines.  Was later admitted to hospital with chest pain- was what turned out to be the same shoulder pain.  Constant.  Feels like a "crook in your neck"  Does not hurt when sits still.  Any movement even gentle breathing or coughing can exacerbate it.  Starts in back of left neck and moves down arms.  No numbenss or tingling.  Does not recall any injuries. Hard to turn neck.  Brings in disability form- works at [employer] as a Conservation officer, nature.     Hypertension:  Does not check home values.  Reports taking metoprolol and lisinopril at baseline.  Reports history of poorly controlled hypertension.  Hyperlipidemia:  Taking statin every night.  No side effects.  Hospital records reviewed- Has xray or cspine and shoulder- non acute.  Had normal CT head.  Was markedly hypertensive- was able to be controlled prior to discharge.  No evidence of ACS.  Review of SystemsPatient Information Form: Screening and ROS  AUDIT-C Score: 0 Do you feel safe in relationships? yes PHQ-2:positive- states for the past 2-3 weeks while unable to work, has caused a great deal of stress and sadness.  Denies history of mental illness or treatment.  Review of Symptoms  General:  Negative for nexplained weight loss, fever Skin: Negative for new or changing mole, sore that won't heal HEENT: Negative for trouble hearing, trouble seeing, ringing in ears, mouth sores, hoarseness, change in voice, dysphagia. CV:  Negative for chest pain, dyspnea, edema, palpitations Resp: Negative for cough, dyspnea, hemoptysis GI: Negative for nausea, vomiting, diarrhea, constipation, abdominal pain, melena, hematochezia. GU: Negative for  dysuria, incontinence, urinary hesitance, hematuria, vaginal or penile discharge, polyuria, sexual difficulty, lumps in testicle or breasts MSK: Negative for muscle cramps or aches, joint pain or swelling Neuro: Negative for headaches, weakness, numbness, dizziness, passing out/fainting Psych: Negative for depression, anxiety, memory problems       Objective:   Physical Exam GEN: Alert & Oriented, No acute distress, obese.  Here with her daughter and grandaughter CV:  Regular Rate & Rhythm, no murmur Respiratory:  Normal work of breathing, CTAB Abd:  + BS, soft, no tenderness to palpation Ext: no pre-tibial edema MSK;  Normal palpation of bony landmarks of spine and shoulder.  Pain on palpation of soft tissue and muscle overlying left trapezius, deltoid, under arm around triceps.  Unable to actively or passively move arm much due to discomfort.   Neuro: 5/5 grip strength, normal sensation to light touch.        Assessment & Plan:

## 2012-08-12 DIAGNOSIS — IMO0002 Reserved for concepts with insufficient information to code with codable children: Secondary | ICD-10-CM | POA: Insufficient documentation

## 2012-08-12 NOTE — Assessment & Plan Note (Signed)
Will decrease prinivil 60 mg daily to combo of lisinopril-hctz 40/25 daily.  Continue metoprolol 50  Bid.  I anticipate will need additional BP control  Will follow-up in 2 weeks for further adjustment of medicines.

## 2012-08-12 NOTE — Assessment & Plan Note (Signed)
LDL in hospital < 100. Continue current statin dose.

## 2012-08-12 NOTE — Assessment & Plan Note (Signed)
Pain does not seem intrinsic to shoulder but rather radicular pain from cervical spine.  Some limitation than expected from atraumatic strain.  Will order MRI to evaluate for disc degeneration, will plan on sending to physical therapy.

## 2012-08-12 NOTE — Assessment & Plan Note (Signed)
a1c in hospital 8.8% in September.  Discussed with patient will follow-up in 2 weeks, will plan to increase diabetes medications.  Will defer today given starting other medications.

## 2012-08-15 ENCOUNTER — Other Ambulatory Visit: Payer: BC Managed Care – PPO

## 2012-08-18 ENCOUNTER — Telehealth: Payer: Self-pay | Admitting: Family Medicine

## 2012-08-18 NOTE — Telephone Encounter (Signed)
Left message to call back regarding MRI results.

## 2012-08-19 ENCOUNTER — Encounter: Payer: Self-pay | Admitting: Family Medicine

## 2012-08-19 ENCOUNTER — Ambulatory Visit (INDEPENDENT_AMBULATORY_CARE_PROVIDER_SITE_OTHER): Payer: BC Managed Care – PPO | Admitting: Family Medicine

## 2012-08-19 VITALS — BP 155/92 | HR 64 | Ht 63.0 in | Wt 242.3 lb

## 2012-08-19 DIAGNOSIS — IMO0002 Reserved for concepts with insufficient information to code with codable children: Secondary | ICD-10-CM

## 2012-08-19 MED ORDER — HYDROCODONE-ACETAMINOPHEN 5-500 MG PO TABS: 1.0000 | ORAL_TABLET | Freq: Three times a day (TID) | ORAL | Status: DC | PRN

## 2012-08-19 MED ORDER — GABAPENTIN 300 MG PO CAPS: 300.0000 mg | ORAL_CAPSULE | Freq: Three times a day (TID) | ORAL | Status: DC

## 2012-08-19 NOTE — Progress Notes (Signed)
  Subjective:    Patient ID: [name], female    DOB: 07/24/1966, 46 y.o.   MRN: [id number]  HPIPatient here to follow-up results of MRI  MRI performed for neck and radicular arm pain on 08/15/12  Reports showed most significant findings: C4-C5: disc herniation and cord compression without abnormal cord signal with foraminal stenosis C5-C6: degenerated disc with bulge and spondylosis C6-C7: large herniated free disc fragment with moderate to severe canal stenosis and cord compression without abnormal cord signal.  Continues to have neck and arm pain.  NO better, no worse.  taking Vicodin with releif.  Meloxicam and flexeril did not help much.  NO new numbness, weakness,, tingling.  Review of Systems See HPI    Objective:   Physical Exam GEN: NAD, obese. Here with her daughter       Assessment & Plan:

## 2012-08-19 NOTE — Assessment & Plan Note (Signed)
No evidence of neurovascular compromise.  Will continue vicodin, add gabapentin- patient instructed how to titrate up to 300 mg TID.  Will refer to neurosurgery to review MRI and discuss options for management.

## 2012-08-19 NOTE — Patient Instructions (Addendum)
Will refer you to neurosurgery  Please give me fax number to send letter to your work

## 2012-08-20 ENCOUNTER — Encounter: Payer: Self-pay | Admitting: Family Medicine

## 2012-08-21 ENCOUNTER — Telehealth: Payer: Self-pay | Admitting: Family Medicine

## 2012-08-21 NOTE — Telephone Encounter (Signed)
Spoke with patient and she stated that CD was given and that she has no other copies and really needs this for tomorrow at her visit.

## 2012-08-21 NOTE — Telephone Encounter (Signed)
Spoke with patient and informed her that I was going to set the CD up front for pick up

## 2012-08-21 NOTE — Telephone Encounter (Signed)
Patient is calling needing a copy of her MRI results (she said a CD) to take to her Neurosurgeon appt tomorrow morning.  She would like to speak to a nurse about how to get these.  Patient said that she got the CD from [facility] and brought it to Dr. Earnest Bailey so she needs it back.

## 2012-08-22 ENCOUNTER — Other Ambulatory Visit: Payer: Self-pay | Admitting: Neurosurgery

## 2012-08-22 ENCOUNTER — Encounter (HOSPITAL_COMMUNITY): Payer: Self-pay | Admitting: Pharmacy Technician

## 2012-08-26 NOTE — Pre-Procedure Instructions (Signed)
21 [name]  08/26/2012   Your procedure is scheduled on:  Thurs, Oct 24 @ 1:55 PM  Report to [facility] at 11:00 AM.  Call this number if you have problems the morning of surgery: [phone]   Remember:   Do not eat food:After Midnight.    Take these medicines the morning of surgery with A SIP OF WATER: Gabapentin(Neurontin),Pain Pill(if needed),and Metoprolol(Lopressor)   Do not wear jewelry, make-up or nail polish.  Do not wear lotions, powders, or perfumes. You may wear deodorant.  Do not shave 48 hours prior to surgery.   Do not bring valuables to the hospital.  Contacts, dentures or bridgework may not be worn into surgery.  Leave suitcase in the car. After surgery it may be brought to your room.  For patients admitted to the hospital, checkout time is 11:00 AM the day of discharge.   Patients discharged the day of surgery will not be allowed to drive home.    Special Instructions: Shower using CHG 2 nights before surgery and the night before surgery.  If you shower the day of surgery use CHG.  Use special wash - you have one bottle of CHG for all showers.  You should use approximately 1/3 of the bottle for each shower.   Please read over the following fact sheets that you were given: Pain Booklet, Coughing and Deep Breathing, MRSA Information and Surgical Site Infection Prevention

## 2012-08-27 ENCOUNTER — Encounter (HOSPITAL_COMMUNITY): Payer: Self-pay

## 2012-08-27 ENCOUNTER — Encounter (HOSPITAL_COMMUNITY)
Admission: RE | Admit: 2012-08-27 | Discharge: 2012-08-27 | Disposition: A | Discharge status: Home / Self Care | Payer: BC Managed Care – PPO | Source: Ambulatory Visit | Attending: Neurosurgery | Admitting: Neurosurgery

## 2012-08-27 LAB — SURGICAL PCR SCREEN: Staphylococcus aureus: NEGATIVE

## 2012-08-27 LAB — COMPREHENSIVE METABOLIC PANEL
AST: 19 U/L (ref 0–37)
BUN: 17 mg/dL (ref 6–23)
CO2: 26 mEq/L (ref 19–32)
Chloride: 94 mEq/L — ABNORMAL LOW (ref 96–112)
Creatinine, Ser: 0.53 mg/dL (ref 0.50–1.10)
GFR calc non Af Amer: >90 mL/min (ref >90)
Total Bilirubin: 0.5 mg/dL (ref 0.3–1.2)

## 2012-08-27 LAB — CBC
HCT: 45.4 % (ref 36.0–46.0)
MCH: 24.7 pg — ABNORMAL LOW (ref 26.0–34.0)
MCHC: 31.7 g/dL (ref 30.0–36.0)
RDW: 17.4 % — ABNORMAL HIGH (ref 11.5–15.5)

## 2012-08-27 NOTE — Consult Note (Signed)
08-27-12 Diabetes coordinator.  Received a call from pre-admissions from RN, Shireen Quan regarding the diabetes status of this patient and to be sure our dept sees this patient to be sure patient is getting glucose control at home. RN states that the last HgbA1C of 8.8% is not current as stated in the notes per her primary MD, as this was a result of Sept, 2011.  Please be sure that glucose is checked upon arrival before surgery tomorrow am and check an A1C during her admission. Will follow glucose control and assess needs for meds (if any), and education and follow-up. Thank you, Lenor Coffin, RN, CNS, Diabetes Coordinator [phone])

## 2012-08-27 NOTE — Progress Notes (Signed)
Notified Dr. Doreen Beam office of WBC of 20.5. Pt scheduled for surgery tomorrow.

## 2012-08-27 NOTE — Progress Notes (Signed)
Per Shanda Bumps in Dr. Doreen Beam office, she spoke to Dr. Danielle Dess (Dr. Phoebe Perch is on vacation) and the patient is on a steroid dose pack; elevated WBC likely related to this. Per Jessica/ Dr. Danielle Dess if pt is not running a fever she should be ok for surgery. Notify Dr. Phoebe Perch in AM of pt's WBC count.

## 2012-08-27 NOTE — Progress Notes (Signed)
Attempted to call regarding orders needing to be signed. Phones at office are down per answering service. Will reattempt to contact office later.

## 2012-08-28 ENCOUNTER — Encounter (HOSPITAL_COMMUNITY): Payer: Self-pay | Admitting: Anesthesiology

## 2012-08-28 ENCOUNTER — Inpatient Hospital Stay (HOSPITAL_COMMUNITY): Payer: BC Managed Care – PPO

## 2012-08-28 ENCOUNTER — Encounter (HOSPITAL_COMMUNITY)
Admission: RE | Disposition: A | Discharge status: Home / Self Care | Payer: Self-pay | Source: Ambulatory Visit | Attending: Neurosurgery

## 2012-08-28 ENCOUNTER — Inpatient Hospital Stay (HOSPITAL_COMMUNITY)
Admission: RE | Admit: 2012-08-28 | Discharge: 2012-08-29 | DRG: 864 | Disposition: A | Discharge status: Home / Self Care | Payer: BC Managed Care – PPO | Source: Ambulatory Visit | Attending: Neurosurgery | Admitting: Neurosurgery

## 2012-08-28 ENCOUNTER — Encounter (HOSPITAL_COMMUNITY): Payer: Self-pay | Admitting: *Deleted

## 2012-08-28 ENCOUNTER — Inpatient Hospital Stay (HOSPITAL_COMMUNITY): Payer: BC Managed Care – PPO | Admitting: Anesthesiology

## 2012-08-28 DIAGNOSIS — E119 Type 2 diabetes mellitus without complications: Secondary | ICD-10-CM | POA: Diagnosis present

## 2012-08-28 DIAGNOSIS — M4712 Other spondylosis with myelopathy, cervical region: Secondary | ICD-10-CM | POA: Diagnosis present

## 2012-08-28 DIAGNOSIS — I252 Old myocardial infarction: Secondary | ICD-10-CM

## 2012-08-28 DIAGNOSIS — M1990 Unspecified osteoarthritis, unspecified site: Secondary | ICD-10-CM | POA: Diagnosis present

## 2012-08-28 DIAGNOSIS — I10 Essential (primary) hypertension: Secondary | ICD-10-CM | POA: Diagnosis present

## 2012-08-28 DIAGNOSIS — G4730 Sleep apnea, unspecified: Secondary | ICD-10-CM | POA: Diagnosis present

## 2012-08-28 DIAGNOSIS — M5000 Cervical disc disorder with myelopathy, unspecified cervical region: Principal | ICD-10-CM | POA: Diagnosis present

## 2012-08-28 DIAGNOSIS — Z8673 Personal history of transient ischemic attack (TIA), and cerebral infarction without residual deficits: Secondary | ICD-10-CM

## 2012-08-28 DIAGNOSIS — Z01812 Encounter for preprocedural laboratory examination: Secondary | ICD-10-CM

## 2012-08-28 HISTORY — PX: ANTERIOR CERVICAL DECOMP/DISCECTOMY FUSION: SHX1161

## 2012-08-28 LAB — CBC WITH DIFFERENTIAL/PLATELET
Basophils Absolute: 0.1 10*3/uL (ref 0.0–0.1)
Basophils Relative: 0 % (ref 0–1)
Eosinophils Absolute: 0.1 10*3/uL (ref 0.0–0.7)
Eosinophils Relative: 1 % (ref 0–5)
HCT: 46 % (ref 36.0–46.0)
MCH: 24.4 pg — ABNORMAL LOW (ref 26.0–34.0)
MCHC: 31.5 g/dL (ref 30.0–36.0)
MCV: 77.4 fL — ABNORMAL LOW (ref 78.0–100.0)
Monocytes Absolute: 1.2 10*3/uL — ABNORMAL HIGH (ref 0.1–1.0)
RDW: 17.4 % — ABNORMAL HIGH (ref 11.5–15.5)

## 2012-08-28 LAB — APTT: aPTT: 29 seconds (ref 24–37)

## 2012-08-28 LAB — URINALYSIS, ROUTINE W REFLEX MICROSCOPIC
Glucose, UA: NEGATIVE mg/dL
Hgb urine dipstick: NEGATIVE
Ketones, ur: NEGATIVE mg/dL
Protein, ur: NEGATIVE mg/dL
pH: 5.5 (ref 5.0–8.0)

## 2012-08-28 LAB — GLUCOSE, CAPILLARY

## 2012-08-28 SURGERY — ANTERIOR CERVICAL DECOMPRESSION/DISCECTOMY FUSION 2 LEVELS
Anesthesia: General | Site: Neck | Wound class: Clean

## 2012-08-28 MED ORDER — DEXAMETHASONE SODIUM PHOSPHATE 10 MG/ML IJ SOLN
INTRAMUSCULAR | Status: AC
  Filled 2012-08-28: qty 1

## 2012-08-28 MED ORDER — CEFAZOLIN SODIUM 1-5 GM-% IV SOLN
1.0000 g | Freq: Three times a day (TID) | INTRAVENOUS | Status: DC
  Administered 2012-08-29 (×2): 1 g via INTRAVENOUS
  Filled 2012-08-28 (×3): qty 50

## 2012-08-28 MED ORDER — DEXAMETHASONE SODIUM PHOSPHATE 10 MG/ML IJ SOLN
INTRAMUSCULAR | Status: DC | PRN
  Administered 2012-08-28: 10 mg via INTRAVENOUS

## 2012-08-28 MED ORDER — SODIUM CHLORIDE 0.9 % IJ SOLN: 3.0000 mL | Freq: Two times a day (BID) | INTRAMUSCULAR | Status: DC

## 2012-08-28 MED ORDER — HYDROMORPHONE HCL PF 1 MG/ML IJ SOLN
0.2500 mg | INTRAMUSCULAR | Status: DC | PRN
  Administered 2012-08-28 (×4): 0.5 mg via INTRAVENOUS

## 2012-08-28 MED ORDER — DOCUSATE SODIUM 100 MG PO CAPS
100.0000 mg | ORAL_CAPSULE | Freq: Two times a day (BID) | ORAL | Status: DC
  Administered 2012-08-28: 100 mg via ORAL
  Filled 2012-08-28: qty 1

## 2012-08-28 MED ORDER — CEFAZOLIN SODIUM-DEXTROSE 2-3 GM-% IV SOLR
2.0000 g | INTRAVENOUS | Status: AC
  Administered 2012-08-28: 2 g via INTRAVENOUS
  Filled 2012-08-28: qty 50

## 2012-08-28 MED ORDER — MIDAZOLAM HCL 5 MG/5ML IJ SOLN
INTRAMUSCULAR | Status: DC | PRN
  Administered 2012-08-28: 2 mg via INTRAVENOUS

## 2012-08-28 MED ORDER — CYCLOBENZAPRINE HCL 10 MG PO TABS: 10.0000 mg | ORAL_TABLET | Freq: Three times a day (TID) | ORAL | Status: DC | PRN

## 2012-08-28 MED ORDER — MENTHOL 3 MG MT LOZG
1.0000 | LOZENGE | OROMUCOSAL | Status: DC | PRN
  Filled 2012-08-28: qty 9

## 2012-08-28 MED ORDER — BISACODYL 10 MG RE SUPP: 10.0000 mg | Freq: Every day | RECTAL | Status: DC | PRN

## 2012-08-28 MED ORDER — LIDOCAINE HCL 1 % IJ SOLN
INTRAMUSCULAR | Status: DC | PRN
  Administered 2012-08-28: 80 mg via INTRADERMAL

## 2012-08-28 MED ORDER — LISINOPRIL 20 MG PO TABS
20.0000 mg | ORAL_TABLET | Freq: Every day | ORAL | Status: DC
  Administered 2012-08-28 – 2012-08-29 (×2): 20 mg via ORAL
  Filled 2012-08-28 (×2): qty 1

## 2012-08-28 MED ORDER — LIDOCAINE HCL 4 % MT SOLN
OROMUCOSAL | Status: DC | PRN
  Administered 2012-08-28: 4 mL via TOPICAL

## 2012-08-28 MED ORDER — MORPHINE SULFATE 2 MG/ML IJ SOLN: 1.0000 mg | INTRAMUSCULAR | Status: DC | PRN

## 2012-08-28 MED ORDER — ROCURONIUM BROMIDE 100 MG/10ML IV SOLN
INTRAVENOUS | Status: DC | PRN
  Administered 2012-08-28: 50 mg via INTRAVENOUS

## 2012-08-28 MED ORDER — FENTANYL CITRATE 0.05 MG/ML IJ SOLN
INTRAMUSCULAR | Status: DC | PRN
  Administered 2012-08-28: 50 ug via INTRAVENOUS
  Administered 2012-08-28: 100 ug via INTRAVENOUS
  Administered 2012-08-28 (×4): 50 ug via INTRAVENOUS

## 2012-08-28 MED ORDER — METFORMIN HCL 500 MG PO TABS
500.0000 mg | ORAL_TABLET | Freq: Once | ORAL | Status: AC
  Administered 2012-08-28: 500 mg via ORAL
  Filled 2012-08-28: qty 1

## 2012-08-28 MED ORDER — GLYCOPYRROLATE 0.2 MG/ML IJ SOLN
INTRAMUSCULAR | Status: DC | PRN
  Administered 2012-08-28: 0.2 mg via INTRAVENOUS

## 2012-08-28 MED ORDER — ACETAMINOPHEN 650 MG RE SUPP: 650.0000 mg | RECTAL | Status: DC | PRN

## 2012-08-28 MED ORDER — NEOSTIGMINE METHYLSULFATE 1 MG/ML IJ SOLN
INTRAMUSCULAR | Status: DC | PRN
  Administered 2012-08-28: 2 mg via INTRAVENOUS

## 2012-08-28 MED ORDER — LIDOCAINE-EPINEPHRINE 1 %-1:100000 IJ SOLN
INTRAMUSCULAR | Status: DC | PRN
  Administered 2012-08-28: 10 mL

## 2012-08-28 MED ORDER — PHENYLEPHRINE HCL 10 MG/ML IJ SOLN
INTRAMUSCULAR | Status: DC | PRN
  Administered 2012-08-28 (×3): 80 ug via INTRAVENOUS

## 2012-08-28 MED ORDER — PROMETHAZINE HCL 25 MG/ML IJ SOLN: 12.5000 mg | INTRAMUSCULAR | Status: DC | PRN

## 2012-08-28 MED ORDER — ZOLPIDEM TARTRATE 5 MG PO TABS: 5.0000 mg | ORAL_TABLET | Freq: Every evening | ORAL | Status: DC | PRN

## 2012-08-28 MED ORDER — ARTIFICIAL TEARS OP OINT
TOPICAL_OINTMENT | OPHTHALMIC | Status: DC | PRN
  Administered 2012-08-28: 1 via OPHTHALMIC

## 2012-08-28 MED ORDER — PROPOFOL 10 MG/ML IV BOLUS
INTRAVENOUS | Status: DC | PRN
  Administered 2012-08-28: 300 mg via INTRAVENOUS

## 2012-08-28 MED ORDER — BACITRACIN 50000 UNITS IM SOLR
INTRAMUSCULAR | Status: DC | PRN
  Administered 2012-08-28: 16:00:00

## 2012-08-28 MED ORDER — BACITRACIN 50000 UNITS IM SOLR
INTRAMUSCULAR | Status: AC
  Filled 2012-08-28: qty 1

## 2012-08-28 MED ORDER — ACETAMINOPHEN 325 MG PO TABS: 650.0000 mg | ORAL_TABLET | ORAL | Status: DC | PRN

## 2012-08-28 MED ORDER — HYDROCHLOROTHIAZIDE 12.5 MG PO CAPS
12.5000 mg | ORAL_CAPSULE | Freq: Every day | ORAL | Status: DC
  Administered 2012-08-28 – 2012-08-29 (×2): 12.5 mg via ORAL
  Filled 2012-08-28 (×2): qty 1

## 2012-08-28 MED ORDER — METOPROLOL TARTRATE 50 MG PO TABS
50.0000 mg | ORAL_TABLET | Freq: Two times a day (BID) | ORAL | Status: DC
Start: 2012-08-28 — End: 2012-08-29
  Administered 2012-08-28 – 2012-08-29 (×2): 50 mg via ORAL
  Filled 2012-08-28 (×3): qty 1

## 2012-08-28 MED ORDER — LISINOPRIL-HYDROCHLOROTHIAZIDE 20-12.5 MG PO TABS: 2.0000 | ORAL_TABLET | Freq: Every day | ORAL | Status: DC

## 2012-08-28 MED ORDER — LACTATED RINGERS IV SOLN
INTRAVENOUS | Status: DC | PRN
  Administered 2012-08-28 (×2): via INTRAVENOUS

## 2012-08-28 MED ORDER — PHENOL 1.4 % MT LIQD: 1.0000 | OROMUCOSAL | Status: DC | PRN

## 2012-08-28 MED ORDER — HYDROMORPHONE HCL PF 1 MG/ML IJ SOLN
INTRAMUSCULAR | Status: AC
  Filled 2012-08-28: qty 1

## 2012-08-28 MED ORDER — OXYCODONE-ACETAMINOPHEN 5-325 MG PO TABS
1.0000 | ORAL_TABLET | ORAL | Status: DC | PRN
  Administered 2012-08-28 – 2012-08-29 (×2): 2 via ORAL
  Filled 2012-08-28 (×2): qty 2

## 2012-08-28 MED ORDER — PROMETHAZINE HCL 25 MG PO TABS: 12.5000 mg | ORAL_TABLET | ORAL | Status: DC | PRN

## 2012-08-28 MED ORDER — METHOCARBAMOL 500 MG PO TABS
500.0000 mg | ORAL_TABLET | Freq: Four times a day (QID) | ORAL | Status: DC | PRN
  Filled 2012-08-28: qty 1

## 2012-08-28 MED ORDER — KETOROLAC TROMETHAMINE 30 MG/ML IJ SOLN
30.0000 mg | Freq: Four times a day (QID) | INTRAMUSCULAR | Status: DC
  Administered 2012-08-29: 30 mg via INTRAVENOUS
  Filled 2012-08-28 (×4): qty 1

## 2012-08-28 MED ORDER — SODIUM CHLORIDE 0.9 % IJ SOLN: 3.0000 mL | INTRAMUSCULAR | Status: DC | PRN

## 2012-08-28 MED ORDER — SUCCINYLCHOLINE CHLORIDE 20 MG/ML IJ SOLN
INTRAMUSCULAR | Status: DC | PRN
  Administered 2012-08-28: 140 mg via INTRAVENOUS

## 2012-08-28 MED ORDER — ONDANSETRON HCL 4 MG/2ML IJ SOLN: 4.0000 mg | INTRAMUSCULAR | Status: DC | PRN

## 2012-08-28 MED ORDER — SODIUM CHLORIDE 0.9 % IV SOLN
INTRAVENOUS | Status: AC
  Filled 2012-08-28: qty 500

## 2012-08-28 MED ORDER — 0.9 % SODIUM CHLORIDE (POUR BTL) OPTIME
TOPICAL | Status: DC | PRN
  Administered 2012-08-28: 1000 mL

## 2012-08-28 MED ORDER — ATORVASTATIN CALCIUM 20 MG PO TABS
20.0000 mg | ORAL_TABLET | Freq: Every day | ORAL | Status: DC
  Filled 2012-08-28: qty 1

## 2012-08-28 MED ORDER — ONDANSETRON HCL 4 MG/2ML IJ SOLN
INTRAMUSCULAR | Status: DC | PRN
  Administered 2012-08-28: 4 mg via INTRAVENOUS

## 2012-08-28 MED ORDER — METHOCARBAMOL 100 MG/ML IJ SOLN
500.0000 mg | Freq: Four times a day (QID) | INTRAVENOUS | Status: DC | PRN
  Administered 2012-08-28: 500 mg via INTRAVENOUS
  Filled 2012-08-28 (×2): qty 5

## 2012-08-28 MED ORDER — LACTATED RINGERS IV SOLN: INTRAVENOUS | Status: DC

## 2012-08-28 MED ORDER — THROMBIN 5000 UNITS EX KIT
PACK | CUTANEOUS | Status: DC | PRN
  Administered 2012-08-28 (×2): 5000 [IU] via TOPICAL

## 2012-08-28 MED ORDER — HEMOSTATIC AGENTS (NO CHARGE) OPTIME
TOPICAL | Status: DC | PRN
  Administered 2012-08-28: 1 via TOPICAL

## 2012-08-28 MED ORDER — METFORMIN HCL 500 MG PO TABS
500.0000 mg | ORAL_TABLET | Freq: Two times a day (BID) | ORAL | Status: DC
  Administered 2012-08-29: 500 mg via ORAL
  Filled 2012-08-28 (×3): qty 1

## 2012-08-28 MED ORDER — HYDROCODONE-ACETAMINOPHEN 5-325 MG PO TABS
1.0000 | ORAL_TABLET | ORAL | Status: DC | PRN
  Administered 2012-08-29: 2 via ORAL
  Filled 2012-08-28: qty 2

## 2012-08-28 MED ORDER — MAGNESIUM HYDROXIDE 400 MG/5ML PO SUSP: 30.0000 mL | Freq: Every day | ORAL | Status: DC | PRN

## 2012-08-28 SURGICAL SUPPLY — 59 items
APL SKNCLS STERI-STRIP NONHPOA (GAUZE/BANDAGES/DRESSINGS) ×1
BAG DECANTER FOR FLEXI CONT (MISCELLANEOUS) ×2 IMPLANT
BANDAGE GAUZE ELAST BULKY 4 IN (GAUZE/BANDAGES/DRESSINGS) ×4 IMPLANT
BENZOIN TINCTURE PRP APPL 2/3 (GAUZE/BANDAGES/DRESSINGS) ×2 IMPLANT
BIT DRILL 2.3 12 FIXED (INSTRUMENTS) IMPLANT
BONE CERV LORDOTIC 14.5X12X7 (Bone Implant) ×2 IMPLANT
BONE CERV LORDOTIC 14.5X12X8 (Bone Implant) ×2 IMPLANT
BUR MATCHSTICK NEURO 3.0 LAGG (BURR) ×2 IMPLANT
CANISTER SUCTION 2500CC (MISCELLANEOUS) ×2 IMPLANT
CLOTH BEACON ORANGE TIMEOUT ST (SAFETY) ×2 IMPLANT
CONT SPEC 4OZ CLIKSEAL STRL BL (MISCELLANEOUS) ×2 IMPLANT
DRAPE LAPAROTOMY 100X72 PEDS (DRAPES) ×2 IMPLANT
DRAPE MICROSCOPE LEICA (MISCELLANEOUS) ×2 IMPLANT
DRAPE POUCH INSTRU U-SHP 10X18 (DRAPES) ×2 IMPLANT
DRESSING TELFA 8X3 (GAUZE/BANDAGES/DRESSINGS) ×1 IMPLANT
DRILL 12MM (INSTRUMENTS) ×2
DURAPREP 6ML APPLICATOR 50/CS (WOUND CARE) ×2 IMPLANT
ELECT REM PT RETURN 9FT ADLT (ELECTROSURGICAL) ×2
ELECTRODE REM PT RTRN 9FT ADLT (ELECTROSURGICAL) ×1 IMPLANT
GAUZE SPONGE 4X4 16PLY XRAY LF (GAUZE/BANDAGES/DRESSINGS) IMPLANT
GLOVE BIOGEL PI IND STRL 7.5 (GLOVE) IMPLANT
GLOVE BIOGEL PI IND STRL 8 (GLOVE) IMPLANT
GLOVE BIOGEL PI INDICATOR 7.5 (GLOVE) ×1
GLOVE BIOGEL PI INDICATOR 8 (GLOVE) ×2
GLOVE ECLIPSE 7.5 STRL STRAW (GLOVE) ×7 IMPLANT
GLOVE EXAM NITRILE LRG STRL (GLOVE) IMPLANT
GLOVE EXAM NITRILE MD LF STRL (GLOVE) IMPLANT
GLOVE EXAM NITRILE XL STR (GLOVE) IMPLANT
GLOVE EXAM NITRILE XS STR PU (GLOVE) IMPLANT
GOWN BRE IMP SLV AUR LG STRL (GOWN DISPOSABLE) ×2 IMPLANT
GOWN BRE IMP SLV AUR XL STRL (GOWN DISPOSABLE) ×1 IMPLANT
GOWN STRL REIN 2XL LVL4 (GOWN DISPOSABLE) ×1 IMPLANT
GRAFT BNE SPCR VG2 14.5X12X7 (Bone Implant) IMPLANT
GRAFT BNE SPCR VG2 14.5X12X8 (Bone Implant) IMPLANT
HEAD HALTER (SOFTGOODS) ×2 IMPLANT
KIT BASIN OR (CUSTOM PROCEDURE TRAY) ×2 IMPLANT
KIT ROOM TURNOVER OR (KITS) ×2 IMPLANT
NDL HYPO 25X1 1.5 SAFETY (NEEDLE) ×1 IMPLANT
NDL SPNL 20GX3.5 QUINCKE YW (NEEDLE) ×1 IMPLANT
NEEDLE HYPO 22GX1.5 SAFETY (NEEDLE) IMPLANT
NEEDLE HYPO 25X1 1.5 SAFETY (NEEDLE) ×2 IMPLANT
NEEDLE SPNL 20GX3.5 QUINCKE YW (NEEDLE) ×2 IMPLANT
NS IRRIG 1000ML POUR BTL (IV SOLUTION) ×2 IMPLANT
PACK LAMINECTOMY NEURO (CUSTOM PROCEDURE TRAY) ×2 IMPLANT
PAD ARMBOARD 7.5X6 YLW CONV (MISCELLANEOUS) ×6 IMPLANT
PATTIES SURGICAL .75X.75 (GAUZE/BANDAGES/DRESSINGS) ×2 IMPLANT
PIN DISTRACTION 14MM (PIN) ×4 IMPLANT
PLATE 14MM (Plate) ×2 IMPLANT
RUBBERBAND STERILE (MISCELLANEOUS) ×4 IMPLANT
SCREW 12MM (Screw) ×8 IMPLANT
SPONGE GAUZE 4X4 12PLY (GAUZE/BANDAGES/DRESSINGS) ×2 IMPLANT
SPONGE INTESTINAL PEANUT (DISPOSABLE) ×2 IMPLANT
STRIP CLOSURE SKIN 1/2X4 (GAUZE/BANDAGES/DRESSINGS) ×2 IMPLANT
SUT VIC AB 3-0 SH 8-18 (SUTURE) ×2 IMPLANT
SYR 20ML ECCENTRIC (SYRINGE) ×2 IMPLANT
TAPE CLOTH SURG 4X10 WHT LF (GAUZE/BANDAGES/DRESSINGS) ×1 IMPLANT
TOWEL OR 17X24 6PK STRL BLUE (TOWEL DISPOSABLE) ×2 IMPLANT
TOWEL OR 17X26 10 PK STRL BLUE (TOWEL DISPOSABLE) ×2 IMPLANT
WATER STERILE IRR 1000ML POUR (IV SOLUTION) ×2 IMPLANT

## 2012-08-28 NOTE — Interval H&P Note (Signed)
History and Physical Interval Note:  08/28/2012 3:14 PM  [name]  has presented today for surgery, with the diagnosis of Cervical hnp with myelopathy, Cervical spondylosis with myelopathy, Cervical stenosis  The various methods of treatment have been discussed with the patient and family. After consideration of risks, benefits and other options for treatment, the patient has consented to  Procedure(s) (LRB) with comments: ANTERIOR CERVICAL DECOMPRESSION/DISCECTOMY FUSION 2 LEVELS (N/A) - C4-5 C6-7 Anterior cervical decompression/diskectomy/fusion/LifeNet Bone/Trestle plate as a surgical intervention .  The patient's history has been reviewed, patient examined, no change in status, stable for surgery.  I have reviewed the patient's chart and labs.  Questions were answered to the patient's satisfaction.     Wilburta Milbourn R

## 2012-08-28 NOTE — Preoperative (Signed)
Beta Blockers   Reason not to administer Beta Blockers:Metoprolol 08/28/12 0900

## 2012-08-28 NOTE — Progress Notes (Signed)
Md aware of pt's systolic BP BP in pacu 180's to 200's.  States pt may go to rm, and is to receive her evening BP meds.

## 2012-08-28 NOTE — H&P (Signed)
See H& P.

## 2012-08-28 NOTE — Anesthesia Preprocedure Evaluation (Addendum)
Anesthesia Evaluation  Patient identified by MRN, date of birth, ID band Patient awake    Reviewed: Allergy & Precautions, H&P , NPO status , Patient's Chart, lab work & pertinent test results  Airway Mallampati: II TM Distance: >3 FB Neck ROM: Limited    Dental  (+) Missing, Chipped, Poor Dentition and Dental Advisory Given   Pulmonary shortness of breath and with exertion, sleep apnea and Oxygen sleep apnea ,  Stable pulm edema by CXR breath sounds clear to auscultation  + decreased breath sounds      Cardiovascular Exercise Tolerance: Poor hypertension, Pt. on medications and Pt. on home beta blockers Rhythm:Regular Rate:Normal  2D echo 07/28/12 Study Conclusions  Left ventricle: The cavity size was normal. Wall thickness was increased in a pattern of moderate LVH. Systolic function was normal. The estimated ejection fraction was in the range of 55% to 60%. Although no diagnostic regional wall motion abnormality was identified, this possibility cannot be completely excluded on the basis of this study.    Neuro/Psych  Neuromuscular disease (cervical spondylosis with myelopathy; pain LUE>RUE)    GI/Hepatic negative GI ROS, (+)     substance abuse  marijuana use,   Endo/Other  diabetes, Type 2, Oral Hypoglycemic AgentsMorbid obesity  Renal/GU negative Renal ROS     Musculoskeletal  (+) Arthritis -, Osteoarthritis,    Abdominal (+) + obese,   Peds  Hematology negative hematology ROS (+)   Anesthesia Other Findings   Reproductive/Obstetrics                       Anesthesia Physical Anesthesia Plan  ASA: III  Anesthesia Plan: General   Post-op Pain Management:    Induction: Intravenous  Airway Management Planned: Oral ETT  Additional Equipment:   Intra-op Plan:   Post-operative Plan: Extubation in OR  Informed Consent: I have reviewed the patients History and Physical, chart, labs  and discussed the procedure including the risks, benefits and alternatives for the proposed anesthesia with the patient or authorized representative who has indicated his/her understanding and acceptance.     Plan Discussed with: CRNA, Anesthesiologist and Surgeon  Anesthesia Plan Comments:         Anesthesia Quick Evaluation

## 2012-08-28 NOTE — Op Note (Signed)
08/28/2012  6:31 PM  PATIENT:  [name]  46 y.o. female  PRE-OPERATIVE DIAGNOSIS:  Cervical herniated nucleus pulposus with myelopathy, Cervical spondylosis with myelopathy, Cervical stenosis ,  C4-5, C6-7  POST-OPERATIVE DIAGNOSIS  :  Same  PROCEDURE:  Procedure(s): ANTERIOR CERVICAL DECOMPRESSION/DISCECTOMY FUSION 2 LEVELS, c4-5, c6-7 , lifenet bone , trestle plate (x2)  SURGEON:  Surgeon(s): Clydene Fake, MD  ASSISTANTS ;  Newell Coral  ANESTHESIA:   general  EBL:  Total I/O In: 1500 [I.V.:1500] Out: 75 [Blood:75]  BLOOD ADMINISTERED:none  DRAINS: none   SPECIMEN:  No Specimen  DICTATION: Patient with neck and left arm pain numbness and weakness. MRI was done showing large disc herniation spinal changes at 45 with cord compression and left-sided root compression also disc protrusion at C6-C6 7 causing cord compression patient brought in for a discectomy and fusion at these 2 levels.  Patient brought into operating room general anesthesia induced patient placed in 10 pounds halter traction prepped draped sterile fashion 7 incision injected with 10 cc 1% lidocaine with epinephrine. Incision was then made from the midline to the anterior border of the sternocleidomastoid muscle the left-sided neck incision taken of the platysma hemostasis obtained with Bovie cauterization platysma incised and blunt dissection taken to the anterior cervical fascia to the anterior cervical spine. Needles placed the 2 interspaces and x-rays obtained showing needles were pointed and in the 45 and 67 spaces. The spaces were incised and partial discectomy done as the needle was removed starting at the fourth of level root reflected the lungs: Muscles laterally using the Bovie self-retaining retractors were placed distraction pins placed in the see for and 5 interspace distracted discectomy then continued microscope was brought in for microdissection using curettes went to more Kerrison punches were  continued the discectomy removing posterior disc osteophyte and ligament decompressed the central canal and performing bilateral foraminotomies large fragment of disc removed from the central left side decompression the roots. We is high-speed drill to remove callus endplate measured the space to be 5 mm in the 5 number LifeNet allograft bone was tapped in place. We had placed distraction pins they were now removed trestle anterior cervical plate was placed with anterior cervical spine and 2 screws placed the C4 into C5 these were tightened down. Attention was then taken to the C6-7 level the retractors removed we can see the 67 level and discectomy can and was done there dislike the level above remove posterior disc calcified disc herniation it was more central decompress the central canal. We were finished we did decompression the central canal and bilateral nerve roots. Again we decorticated in place with high-speed drill measured the space to be 6 mm dissection or LifeNet allograft bone tapped in place. A Tressel anterior cervical plate was placed with interested in spine and 2 screws placed the C4-6 to the C7 these were tightened down lateral x-rays obtained showing good position plate-screw bone plug at C4-5 level there is no indentation across 56 we could not see lower due to the shoulders but Intra-Op good position of plate and screws. Retractors removed with very good hemostasis we irrigated with about solution platysma was closed with 3-0 Vicryl interrupted sutures subcutaneous tissue closed the same skin closed benzoin Steri-Strips dressing was placed patient saucer color woken (and transferred to recovery room.  PLAN OF CARE: Admit to inpatient   PATIENT DISPOSITION:  PACU - hemodynamically stable.

## 2012-08-28 NOTE — Transfer of Care (Signed)
Immediate Anesthesia Transfer of Care Note  Patient: Lashandra M Kyser  Procedure(s) Performed: Procedure(s) (LRB) with comments: ANTERIOR CERVICAL DECOMPRESSION/DISCECTOMY FUSION 2 LEVELS (N/A) - Cervical four-five, cervical six-seven Anterior cervical decompression/diskectomy/fusion/LifeNet Bone/Trestle plate  Patient Location: PACU  Anesthesia Type: General  Level of Consciousness: awake, alert , oriented and patient cooperative  Airway & Oxygen Therapy: Patient Spontanous Breathing and Patient connected to nasal cannula oxygen  Post-op Assessment: Report given to PACU RN, Post -op Vital signs reviewed and stable and Patient moving all extremities X 4  Post vital signs: Reviewed and stable  Complications: No apparent anesthesia complications

## 2012-08-28 NOTE — Anesthesia Postprocedure Evaluation (Signed)
  Anesthesia Post-op Note  Patient: [name]  Procedure(s) Performed: Procedure(s) (LRB) with comments: ANTERIOR CERVICAL DECOMPRESSION/DISCECTOMY FUSION 2 LEVELS (N/A) - Cervical four-five, cervical six-seven Anterior cervical decompression/diskectomy/fusion/LifeNet Bone/Trestle plate  Patient Location: PACU  Anesthesia Type: General  Level of Consciousness: awake  Airway and Oxygen Therapy: Patient Spontanous Breathing  Post-op Pain: mild  Post-op Assessment: Post-op Vital signs reviewed  Post-op Vital Signs: Reviewed  Complications: No apparent anesthesia complications

## 2012-08-29 LAB — URINE CULTURE: Colony Count: 65000

## 2012-08-29 MED ORDER — PNEUMOCOCCAL VAC POLYVALENT 25 MCG/0.5ML IJ INJ
0.5000 mL | INJECTION | INTRAMUSCULAR | Status: AC
  Administered 2012-08-29: 0.5 mL via INTRAMUSCULAR
  Filled 2012-08-29: qty 0.5

## 2012-08-29 MED ORDER — CYCLOBENZAPRINE HCL 10 MG PO TABS: 10.0000 mg | ORAL_TABLET | Freq: Three times a day (TID) | ORAL | Status: DC | PRN

## 2012-08-29 MED ORDER — OXYCODONE-ACETAMINOPHEN 5-325 MG PO TABS: 1.0000 | ORAL_TABLET | ORAL | Status: DC | PRN

## 2012-08-29 NOTE — Evaluation (Signed)
Occupational Therapy Evaluation Patient Details Name: CORLEY LUKE MRN: [id number] DOB: 12-05-1965 Today's Date: 08/29/2012 Time: 0940-1000 OT Time Calculation (min): 20 min  OT Assessment / Plan / Recommendation Clinical Impression  Pt s/p ANTERIOR CERVICAL DECOMPRESSION/DISCECTOMY FUSION 2 LEVELS, c4-5, c6-7. All education complete and pt with necessary level of assist upon d/c. No DME needs or further acute OT indicated. Will sign off.    OT Assessment  Patient does not need any further OT services    Follow Up Recommendations  No OT follow up;Supervision - Intermittent    Barriers to Discharge      Equipment Recommendations  None recommended by OT    Recommendations for Other Services    Frequency       Precautions / Restrictions Precautions Precautions: Cervical Precaution Comments: educated pt on cervical precautions and how to maintain with BADLs and IADLs Required Braces or Orthoses: Cervical Brace Cervical Brace: Soft collar;Applied in sitting position Restrictions Weight Bearing Restrictions: No   Pertinent Vitals/Pain Pt reports 2/10 neck pain; pt premedicated    ADL  ADL Comments: Pt supervision to independent with all ADLs, she is able to pull legs up to don/doff socks and to thread legs into underwear/pants. Pt educated on cervical precautions and their impact on ADLs. Pt I with ambulation.    OT Diagnosis:    OT Problem List:   OT Treatment Interventions:     OT Goals    Visit Information  Last OT Received On: 08/29/12 Assistance Needed: +1    Subjective Data      Prior Functioning     Home Living Lives With: Spouse Available Help at Discharge: Family;Available 24 hours/day Type of Home: House Prior Function Level of Independence: Independent Able to Take Stairs?: Reciprically Communication Communication: No difficulties Dominant Hand: Right         Vision/Perception Vision - Assessment Additional Comments: pt reports she needs  to have her eyes examined as "the words are blurry" when she tries to read.     Cognition  Overall Cognitive Status: Appears within functional limits for tasks assessed/performed Arousal/Alertness: Awake/alert Orientation Level: Appears intact for tasks assessed Behavior During Session: [facility] for tasks performed    Extremity/Trunk Assessment Right Upper Extremity Assessment RUE ROM/Strength/Tone: WFL for tasks assessed RUE Sensation: WFL - Light Touch RUE Coordination: WFL - gross/fine motor Left Upper Extremity Assessment LUE ROM/Strength/Tone: WFL for tasks assessed LUE Sensation: WFL - Light Touch LUE Coordination: WFL - gross/fine motor     Mobility Bed Mobility Bed Mobility: Not assessed Details for Bed Mobility Assistance: educated pt to log roll into and out of bed Transfers Transfers: Sit to Stand;Stand to Sit Sit to Stand: 7: Independent;From bed Stand to Sit: 7: Independent;To bed     Shoulder Instructions     Exercise     Balance     End of Session OT - End of Session Equipment Utilized During Treatment: Cervical collar Activity Tolerance: Patient tolerated treatment well Patient left: in bed;with call bell/phone within reach  GO     Harvard Zeiss 08/29/2012, 11:41 AM

## 2012-08-29 NOTE — Discharge Summary (Signed)
Physician Discharge Summary  Patient ID: [name] MRN: [id number] DOB/AGE: [birth year] 46 y.o.  Admit date: 08/28/2012 Discharge date: 08/29/2012  Admission Diagnoses:Cervical herniated nucleus pulposus with myelopathy, Cervical spondylosis with myelopathy, Cervical stenosis , C4-5, C6-7   Discharge Diagnoses: Cervical herniated nucleus pulposus with myelopathy, Cervical spondylosis with myelopathy, Cervical stenosis , C4-5, C6-7  Active Problems:  * No active hospital problems. *    Discharged Condition: good  Hospital Course: pt admitted day of surgery - underwent procedure below - pt doing well, no arm pain,tingling,   ambulating, voiding, eating  - much improved strength R deltoid/arm     Consults: None  Significant Diagnostic Studies: none  Treatments: surgery: ANTERIOR CERVICAL DECOMPRESSION/DISCECTOMY FUSION 2 LEVELS, c4-5, c6-7 , lifenet bone , trestle plate (x2)   Discharge Exam: Blood pressure 137/79, pulse 73, temperature 97.8 F (36.6 C), temperature source Oral, resp. rate 20, last menstrual period 07/22/2012, SpO2 98.00%. Wound:c/d/i  Disposition: home     Medication List     As of 08/29/2012  7:57 AM    STOP taking these medications         gabapentin 300 MG capsule   Commonly known as: NEURONTIN      TAKE these medications         aspirin 81 MG EC tablet   Take 1 tablet (81 mg total) by mouth daily.      atorvastatin 20 MG tablet   Commonly known as: LIPITOR   Take 1 tablet (20 mg total) by mouth daily at 6 PM.      cyclobenzaprine 10 MG tablet   Commonly known as: FLEXERIL   Take 1 tablet (10 mg total) by mouth 3 (three) times daily as needed for muscle spasms.      HYDROcodone-acetaminophen 5-500 MG per tablet   Commonly known as: VICODIN   Take 1 tablet by mouth every 8 (eight) hours as needed. For pain      lisinopril-hydrochlorothiazide 20-12.5 MG per tablet   Commonly known as: PRINZIDE,ZESTORETIC   Take 2 tablets by mouth  daily.      metFORMIN 500 MG tablet   Commonly known as: GLUCOPHAGE   Take 500 mg by mouth 2 (two) times daily with a meal.      metoprolol 50 MG tablet   Commonly known as: LOPRESSOR   Take 1 tablet (50 mg total) by mouth 2 (two) times daily.      oxyCODONE-acetaminophen 5-325 MG per tablet   Commonly known as: PERCOCET/ROXICET   Take 1-2 tablets by mouth every 4 (four) hours as needed.      UNABLE TO FIND   1 applicator by Other route once. Physical therapy for cervical radiculopathy   Number of treatments and frequency per PT recomendations         Signed: Clydene Fake, MD 08/29/2012, 7:57 AM

## 2012-09-01 ENCOUNTER — Encounter (HOSPITAL_COMMUNITY): Payer: Self-pay | Admitting: Neurosurgery

## 2012-09-13 ENCOUNTER — Emergency Department (HOSPITAL_COMMUNITY)
Admission: EM | Admit: 2012-09-13 | Discharge: 2012-09-13 | Disposition: A | Discharge status: Home / Self Care | Payer: BC Managed Care – PPO | Attending: Emergency Medicine | Admitting: Emergency Medicine

## 2012-09-13 ENCOUNTER — Encounter (HOSPITAL_COMMUNITY): Payer: Self-pay | Admitting: Physical Medicine and Rehabilitation

## 2012-09-13 ENCOUNTER — Emergency Department (HOSPITAL_COMMUNITY): Payer: BC Managed Care – PPO

## 2012-09-13 DIAGNOSIS — I10 Essential (primary) hypertension: Secondary | ICD-10-CM | POA: Insufficient documentation

## 2012-09-13 DIAGNOSIS — Z79899 Other long term (current) drug therapy: Secondary | ICD-10-CM | POA: Insufficient documentation

## 2012-09-13 DIAGNOSIS — K649 Unspecified hemorrhoids: Secondary | ICD-10-CM | POA: Insufficient documentation

## 2012-09-13 DIAGNOSIS — Z7982 Long term (current) use of aspirin: Secondary | ICD-10-CM | POA: Insufficient documentation

## 2012-09-13 DIAGNOSIS — G4730 Sleep apnea, unspecified: Secondary | ICD-10-CM | POA: Insufficient documentation

## 2012-09-13 DIAGNOSIS — K5900 Constipation, unspecified: Secondary | ICD-10-CM | POA: Insufficient documentation

## 2012-09-13 DIAGNOSIS — E119 Type 2 diabetes mellitus without complications: Secondary | ICD-10-CM | POA: Insufficient documentation

## 2012-09-13 LAB — CBC WITH DIFFERENTIAL/PLATELET
Basophils Absolute: 0 10*3/uL (ref 0.0–0.1)
Eosinophils Relative: 1 % (ref 0–5)
HCT: 37.3 % (ref 36.0–46.0)
Lymphocytes Relative: 22 % (ref 12–46)
Lymphs Abs: 3.1 10*3/uL (ref 0.7–4.0)
MCV: 77.9 fL — ABNORMAL LOW (ref 78.0–100.0)
Neutro Abs: 10.3 10*3/uL — ABNORMAL HIGH (ref 1.7–7.7)
Platelets: 241 10*3/uL (ref 150–400)
RBC: 4.79 MIL/uL (ref 3.87–5.11)
WBC: 14.1 10*3/uL — ABNORMAL HIGH (ref 4.0–10.5)

## 2012-09-13 LAB — BASIC METABOLIC PANEL
CO2: 27 mEq/L (ref 19–32)
Calcium: 9.2 mg/dL (ref 8.4–10.5)
Chloride: 97 mEq/L (ref 96–112)
Glucose, Bld: 179 mg/dL — ABNORMAL HIGH (ref 70–99)
Potassium: 3.8 mEq/L (ref 3.5–5.1)
Sodium: 136 mEq/L (ref 135–145)

## 2012-09-13 LAB — URINE MICROSCOPIC-ADD ON

## 2012-09-13 LAB — URINALYSIS, ROUTINE W REFLEX MICROSCOPIC
Glucose, UA: NEGATIVE mg/dL
Hgb urine dipstick: NEGATIVE
Specific Gravity, Urine: 1.007 (ref 1.005–1.030)
Urobilinogen, UA: 0.2 mg/dL (ref 0.0–1.0)

## 2012-09-13 MED ORDER — DISPOSABLE ENEMA 19-7 GM/118ML RE ENEM: 1.0000 | ENEMA | Freq: Once | RECTAL | Status: DC

## 2012-09-13 MED ORDER — FLEET ENEMA 7-19 GM/118ML RE ENEM
1.0000 | ENEMA | Freq: Once | RECTAL | Status: AC
  Administered 2012-09-13: 14:00:00 via RECTAL
  Filled 2012-09-13: qty 1

## 2012-09-13 MED ORDER — LACTULOSE 10 GM/15ML PO SOLN: 10.0000 g | Freq: Two times a day (BID) | ORAL | Status: DC | PRN

## 2012-09-13 NOTE — ED Notes (Signed)
Pt. Only took 3/4 of the enema.  Pt. Had no return stool.  Only had fluid return.

## 2012-09-13 NOTE — ED Notes (Signed)
Pt presents to department for evaluation of abdominal cramping, constipation and hemorrhoid. Denies pain at the time. States she had neck surgery on 10/25 and constipation started after surgery. She is conscious alert and oriented x4. No signs of distress noted.

## 2012-09-13 NOTE — ED Provider Notes (Addendum)
History     CSN: [id number] Arrival date & time 09/13/12  1127 First MD Initiated Contact with Patient 09/13/12 1223      Chief Complaint  Patient presents with  . Constipation       Patient is a 46 y.o. female presenting with constipation. The history is provided by the patient.  Constipation  The current episode started 3 to 5 days ago. The onset was gradual. The problem occurs continuously. The patient is experiencing no pain. The stool is described as hard. There was no prior successful therapy. Associated symptoms include hemorrhoids. Pertinent negatives include no fever, no diarrhea, no nausea, no vomiting, no chest pain and no coughing.  She had recent surgery and has been trying to take suppositories for her constipation.  She has not had any relief.   This am she felt weak all overwhen she went to the bathroom.  Family had to assist her to the restroom.  Past Medical History  Diagnosis Date  . Diabetes mellitus   . Hypertension     Does not see a cardiologist  . Sleep apnea     Uses CPAP    Past Surgical History  Procedure Date  . Breast surgery     cyst right breast  . Anterior cervical decomp/discectomy fusion 08/28/2012    Procedure: ANTERIOR CERVICAL DECOMPRESSION/DISCECTOMY FUSION 2 LEVELS;  Surgeon: Clydene Fake, MD;  Location: [facility];  Service: Neurosurgery;  Laterality: N/A;  Cervical four-five, cervical six-seven Anterior cervical decompression/diskectomy/fusion/LifeNet Bone/Trestle plate    Family History  Problem Relation Age of Onset  . Heart disease Mother 47  . Diabetes Mother   . Hypertension Mother   . Heart disease Paternal Aunt   . Cancer Neg Hx   . Stroke Neg Hx   . Alcohol abuse Neg Hx     History  Substance Use Topics  . Smoking status: Never Smoker   . Smokeless tobacco: Not on file  . Alcohol Use: Yes     Comment: socially on holidays    OB History    Grav Para Term Preterm Abortions TAB SAB Ect Mult Living                  Review of Systems  Constitutional: Negative for fever.  Respiratory: Negative for cough.   Cardiovascular: Negative for chest pain.  Gastrointestinal: Positive for hemorrhoids. Negative for nausea, vomiting and diarrhea.  All other systems reviewed and are negative.    Allergies  Review of patient's allergies indicates no known allergies.  Home Medications   Current Outpatient Rx  Name  Route  Sig  Dispense  Refill  . ASPIRIN 81 MG PO TBEC   Oral   Take 1 tablet (81 mg total) by mouth daily.         . ATORVASTATIN CALCIUM 20 MG PO TABS   Oral   Take 1 tablet (20 mg total) by mouth daily at 6 PM.   30 tablet   11   . CYCLOBENZAPRINE HCL 10 MG PO TABS   Oral   Take 1 tablet (10 mg total) by mouth 3 (three) times daily as needed for muscle spasms.   50 tablet   2   . HYDROCODONE-ACETAMINOPHEN 5-500 MG PO TABS   Oral   Take 1 tablet by mouth every 8 (eight) hours as needed. For pain         . LISINOPRIL-HYDROCHLOROTHIAZIDE 20-12.5 MG PO TABS   Oral   Take 2 tablets by  mouth daily.   60 tablet   11   . METFORMIN HCL 500 MG PO TABS   Oral   Take 500 mg by mouth 2 (two) times daily with a meal.          . METOPROLOL TARTRATE 50 MG PO TABS   Oral   Take 1 tablet (50 mg total) by mouth 2 (two) times daily.   60 tablet   2   . OXYCODONE-ACETAMINOPHEN 5-325 MG PO TABS   Oral   Take 1-2 tablets by mouth every 4 (four) hours as needed.   51 tablet   0   . UNABLE TO FIND   Other   1 applicator by Other route once. Physical therapy for cervical radiculopathy  Number of treatments and frequency per PT recomendations           BP 120/62  Pulse 85  Temp 97.8 F (36.6 C) (Oral)  Resp 20  SpO2 99%  LMP 08/21/2012  Physical Exam  Nursing note and vitals reviewed. Constitutional: She appears well-developed. No distress.       Obese   HENT:  Head: Normocephalic and atraumatic.  Right Ear: External ear normal.  Left Ear: External ear normal.   Eyes: Conjunctivae normal are normal. Right eye exhibits no discharge. Left eye exhibits no discharge. No scleral icterus.  Neck: Neck supple. No tracheal deviation present.  Cardiovascular: Normal rate, regular rhythm and intact distal pulses.   Pulmonary/Chest: Effort normal and breath sounds normal. No stridor. No respiratory distress. She has no wheezes. She has no rales.  Abdominal: Soft. Bowel sounds are normal. She exhibits no distension and no mass. There is no tenderness. There is no rebound and no guarding.  Genitourinary: Rectal exam shows no external hemorrhoid, no internal hemorrhoid, no tenderness and anal tone normal.       Large amount of stool in rectal vault, soft  Musculoskeletal: She exhibits no edema and no tenderness.  Neurological: She is alert. She has normal strength. No sensory deficit. Cranial nerve deficit:  no gross defecits noted. She exhibits normal muscle tone. She displays no seizure activity. Coordination normal.  Skin: Skin is warm and dry. No rash noted. She is not diaphoretic.  Psychiatric: She has a normal mood and affect.    ED Course  Procedures (including critical care time)  Labs Reviewed  CBC WITH DIFFERENTIAL - Abnormal; Notable for the following:    WBC 14.1 (*)     MCV 77.9 (*)     MCH 25.3 (*)     RDW 17.8 (*)     Neutro Abs 10.3 (*)     All other components within normal limits  BASIC METABOLIC PANEL - Abnormal; Notable for the following:    Glucose, Bld 179 (*)     BUN 30 (*)     GFR calc non Af Amer 74 (*)     GFR calc Af Amer 86 (*)     All other components within normal limits  URINALYSIS, ROUTINE W REFLEX MICROSCOPIC   Dg Abd 2 Views  09/13/2012  *RADIOLOGY REPORT*  Clinical Data: Constipation  ABDOMEN - 2 VIEW  Comparison: None.  Findings: There is nonspecific nonobstructive bowel gas pattern. Some stool noted in the left colon and distal sigmoid colon. No free abdominal air.  IMPRESSION: Nonspecific nonobstructive bowel gas  pattern.  No free abdominal air.  Some colonic stool noted in the left colon and distal sigmoid colon.   Original Report Authenticated By: Lang Snow  Pop, M.D.      MDM  Pt has leukocytosis however that has been decreasing since her recent surgery.  Xrays without signs of obstruction.  Pt was given an enema in the ED with good response.  Suspect her symptoms are related to constipation.       Celene Kras, MD 09/13/12 1452  UA abnl but not definitive.  Will check urine cult  Celene Kras, MD 09/13/12 1536

## 2012-09-13 NOTE — ED Notes (Signed)
Pt. Has been constipated since the day of surgery.    She has not had a normal BM, She requires manual assistance.

## 2012-09-14 LAB — URINE CULTURE: Colony Count: 65000

## 2012-09-16 ENCOUNTER — Other Ambulatory Visit: Payer: Self-pay | Admitting: Family Medicine

## 2013-06-23 ENCOUNTER — Telehealth: Payer: Self-pay | Admitting: Family Medicine

## 2013-06-23 NOTE — Telephone Encounter (Signed)
Left Vm requesting pt to call to make an appt for A1C check

## 2013-10-07 ENCOUNTER — Emergency Department (HOSPITAL_COMMUNITY): Payer: Self-pay

## 2013-10-07 ENCOUNTER — Emergency Department (HOSPITAL_COMMUNITY)
Admission: EM | Admit: 2013-10-07 | Discharge: 2013-10-07 | Disposition: A | Discharge status: Home / Self Care | Payer: Self-pay | Attending: Emergency Medicine | Admitting: Emergency Medicine

## 2013-10-07 ENCOUNTER — Encounter (HOSPITAL_COMMUNITY): Payer: Self-pay | Admitting: Emergency Medicine

## 2013-10-07 DIAGNOSIS — Z79899 Other long term (current) drug therapy: Secondary | ICD-10-CM | POA: Insufficient documentation

## 2013-10-07 DIAGNOSIS — IMO0002 Reserved for concepts with insufficient information to code with codable children: Secondary | ICD-10-CM | POA: Insufficient documentation

## 2013-10-07 DIAGNOSIS — R209 Unspecified disturbances of skin sensation: Secondary | ICD-10-CM | POA: Insufficient documentation

## 2013-10-07 DIAGNOSIS — M5416 Radiculopathy, lumbar region: Secondary | ICD-10-CM

## 2013-10-07 DIAGNOSIS — R51 Headache: Secondary | ICD-10-CM | POA: Insufficient documentation

## 2013-10-07 DIAGNOSIS — G4730 Sleep apnea, unspecified: Secondary | ICD-10-CM | POA: Insufficient documentation

## 2013-10-07 DIAGNOSIS — E119 Type 2 diabetes mellitus without complications: Secondary | ICD-10-CM | POA: Insufficient documentation

## 2013-10-07 DIAGNOSIS — I10 Essential (primary) hypertension: Secondary | ICD-10-CM | POA: Insufficient documentation

## 2013-10-07 MED ORDER — LISINOPRIL 20 MG PO TABS
30.0000 mg | ORAL_TABLET | Freq: Once | ORAL | Status: AC
  Administered 2013-10-07: 30 mg via ORAL
  Filled 2013-10-07 (×2): qty 1

## 2013-10-07 MED ORDER — TRAMADOL HCL 50 MG PO TABS: 50.0000 mg | ORAL_TABLET | Freq: Four times a day (QID) | ORAL | Status: DC | PRN

## 2013-10-07 NOTE — ED Provider Notes (Signed)
CSN: [id number]     Arrival date & time 10/07/13  1932 History  This chart was scribed for Jaynie Crumble, PA-C, working with Donnetta Hutching, MD, by Ardelia Mems ED Scribe. This patient was seen in room WTR5/WTR5 and the patient's care was started at 8:20 PM.   Chief Complaint  Patient presents with  . Leg Pain    The history is provided by the patient. No language interpreter was used.    HPI Comments: [name] is a 47 y.o. female with a history of DM and HTN who presents to the Emergency Department complaining of intermittent right upper leg pain, described as "burning" over the past 2 months. She reports associated intermittent paresthesias to her right leg. She denies any injury to have onset this pain. She denies any back pain. She has a history of DM and HTN and states that she has been compliant with her medications (Metformin and Lisinopril) as prescribed. She states that she has not taken her night time dose of Lisinopril yet tonight. Her ED blood pressure is 205/90. She also states that she has had a headache today. She states that she does not check her blood sugar at home, and has not had her blood sugar taken in months. She denies fever, bowel or bladder incontinence or any other symptoms.   Past Medical History  Diagnosis Date  . Diabetes mellitus   . Hypertension     Does not see a cardiologist  . Sleep apnea     Uses CPAP   Past Surgical History  Procedure Laterality Date  . Breast surgery      cyst right breast  . Anterior cervical decomp/discectomy fusion  08/28/2012    Procedure: ANTERIOR CERVICAL DECOMPRESSION/DISCECTOMY FUSION 2 LEVELS;  Surgeon: Clydene Fake, MD;  Location: [facility];  Service: Neurosurgery;  Laterality: N/A;  Cervical four-five, cervical six-seven Anterior cervical decompression/diskectomy/fusion/LifeNet Bone/Trestle plate   Family History  Problem Relation Age of Onset  . Heart disease Mother 55  . Diabetes Mother   . Hypertension  Mother   . Heart disease Paternal Aunt   . Cancer Neg Hx   . Stroke Neg Hx   . Alcohol abuse Neg Hx    History  Substance Use Topics  . Smoking status: Never Smoker   . Smokeless tobacco: Not on file  . Alcohol Use: Yes     Comment: socially on holidays   OB History   Grav Para Term Preterm Abortions TAB SAB Ect Mult Living                 Review of Systems  Constitutional: Negative for fever.  Gastrointestinal:       Denies bowel incontinence.  Genitourinary:       Denies bladder incontinence.  Musculoskeletal: Negative for back pain.       Right leg pain  Neurological: Positive for headaches.  All other systems reviewed and are negative.    Allergies  Review of patient's allergies indicates no known allergies.  Home Medications   Current Outpatient Rx  Name  Route  Sig  Dispense  Refill  . ibuprofen (ADVIL,MOTRIN) 200 MG tablet   Oral   Take 400 mg by mouth every 8 (eight) hours as needed for headache or moderate pain.         Marland Kitchen lisinopril (PRINIVIL,ZESTRIL) 30 MG tablet   Oral   Take 30 mg by mouth 2 (two) times daily.         Marland Kitchen  metFORMIN (GLUCOPHAGE) 500 MG tablet   Oral   Take 500 mg by mouth 2 (two) times daily with a meal.           Triage Vitals: BP 205/90  Pulse 81  Temp(Src) 97.9 F (36.6 C) (Oral)  Resp 16  Ht 5\' 2"  (1.575 m)  Wt 300 lb (136.079 kg)  BMI 54.86 kg/m2  SpO2 94%  LMP 09/18/2013  Physical Exam  Nursing note and vitals reviewed. Constitutional: She is oriented to person, place, and time. She appears well-developed and well-nourished. No distress.  HENT:  Head: Normocephalic and atraumatic.  Eyes: EOM are normal.  Neck: Neck supple. No tracheal deviation present.  Cardiovascular: Normal rate.   Pulmonary/Chest: Effort normal. No respiratory distress.  Musculoskeletal: Normal range of motion.  No midline lumbar spine tenderness. Tender to the right SI joint and buttock. Pain with right straight leg raise.    Neurological: She is alert and oriented to person, place, and time.  Decreased sensation to the lateral right thigh compared to left. 5/5 and equal lower extremity strength. 2+ and equal patellar reflexes bilaterally. Pt able to dorsiflex bilateral toes and feet with good strength against resistance.   Skin: Skin is warm and dry.  Psychiatric: She has a normal mood and affect. Her behavior is normal.    ED Course  Procedures (including critical care time)  DIAGNOSTIC STUDIES: Oxygen Saturation is 94% on RA, adequate  by my interpretation.    COORDINATION OF CARE: 8:27 PM- Pt advised of plan for treatment and pt agrees.  Labs Review Labs Reviewed  GLUCOSE, CAPILLARY - Abnormal; Notable for the following:    Glucose-Capillary 145 (*)    All other components within normal limits   Imaging Review Dg Lumbar Spine Complete  10/07/2013   CLINICAL DATA:  Low back pain, no known injury  EXAM: LUMBAR SPINE - COMPLETE 4+ VIEW  COMPARISON:  09/13/2012  FINDINGS: Five views of lumbar spine submitted. No acute fracture or subluxation. Minimal anterior spurring upper and lower endplate of L3 and L4 vertebral body. Alignment and vertebral heights are preserved. Minimal levoscoliosis.  IMPRESSION: No acute fracture or subluxation.  Mild degenerative changes.   Electronically Signed   By: Natasha Mead M.D.   On: 10/07/2013 20:45    EKG Interpretation   None       MDM   1. Lumbar radiculopathy   2. Hypertension     PT with right thigh intermittent burning and pain. Also reports pain in buttock. 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 No leg weakness or full numbness. Taking ibuprofen with no improvement. Suspect pain is lumbar radiculopathy. Will treat with ultram and ibuprofen. cbg checked since pt does not check it at home, and it is 145. Pt's BP elevated, given regular dose of blood pressure medication which she has not taken yet. Will d/chome with outpatient follow up.    Filed Vitals:   10/07/13 1944 10/07/13 2034 10/07/13 2048 10/07/13 2136  BP: 205/90 171/87 195/85 172/83  Pulse: 81  76 75  Temp: 97.9 F (36.6 C)     TempSrc: Oral     Resp: 16   18  Height: 5\' 2"  (1.575 m)     Weight: 300 lb (136.079 kg)     SpO2: 94%  95% 95%     I personally performed the services described in this documentation, which was scribed in my presence. The recorded information has been reviewed and is accurate.  Lottie Mussel, PA-C 10/08/13 [phone]

## 2013-10-07 NOTE — ED Notes (Addendum)
Pt states her rt leg has been in pain for the past week. She states the pain is burning and feels like pins and needles. The pt also has htn and a 4/10 headache since noon.

## 2013-10-19 NOTE — ED Provider Notes (Signed)
Medical screening examination/treatment/procedure(s) were performed by non-physician practitioner and as supervising physician I was immediately available for consultation/collaboration.  EKG Interpretation   None        Donnetta Hutching, MD 10/19/13 [phone]

## 2013-11-16 ENCOUNTER — Encounter (HOSPITAL_COMMUNITY): Payer: Self-pay | Admitting: Emergency Medicine

## 2013-11-16 ENCOUNTER — Inpatient Hospital Stay (HOSPITAL_COMMUNITY)
Admission: EM | Admit: 2013-11-16 | Discharge: 2013-11-19 | DRG: 292 | Disposition: A | Discharge status: Home / Self Care | Payer: BC Managed Care – PPO | Attending: Family Medicine | Admitting: Family Medicine

## 2013-11-16 ENCOUNTER — Emergency Department (HOSPITAL_COMMUNITY): Payer: BC Managed Care – PPO

## 2013-11-16 DIAGNOSIS — I509 Heart failure, unspecified: Secondary | ICD-10-CM

## 2013-11-16 DIAGNOSIS — I10 Essential (primary) hypertension: Secondary | ICD-10-CM | POA: Diagnosis present

## 2013-11-16 DIAGNOSIS — E119 Type 2 diabetes mellitus without complications: Secondary | ICD-10-CM

## 2013-11-16 DIAGNOSIS — J811 Chronic pulmonary edema: Secondary | ICD-10-CM | POA: Diagnosis present

## 2013-11-16 DIAGNOSIS — R079 Chest pain, unspecified: Secondary | ICD-10-CM | POA: Diagnosis present

## 2013-11-16 DIAGNOSIS — I152 Hypertension secondary to endocrine disorders: Secondary | ICD-10-CM | POA: Diagnosis present

## 2013-11-16 DIAGNOSIS — E1159 Type 2 diabetes mellitus with other circulatory complications: Secondary | ICD-10-CM | POA: Diagnosis present

## 2013-11-16 DIAGNOSIS — Z8249 Family history of ischemic heart disease and other diseases of the circulatory system: Secondary | ICD-10-CM

## 2013-11-16 DIAGNOSIS — Z833 Family history of diabetes mellitus: Secondary | ICD-10-CM

## 2013-11-16 DIAGNOSIS — R0602 Shortness of breath: Secondary | ICD-10-CM

## 2013-11-16 DIAGNOSIS — E669 Obesity, unspecified: Secondary | ICD-10-CM | POA: Diagnosis present

## 2013-11-16 DIAGNOSIS — I5042 Chronic combined systolic (congestive) and diastolic (congestive) heart failure: Secondary | ICD-10-CM | POA: Diagnosis present

## 2013-11-16 DIAGNOSIS — G4730 Sleep apnea, unspecified: Secondary | ICD-10-CM | POA: Diagnosis present

## 2013-11-16 DIAGNOSIS — I160 Hypertensive urgency: Secondary | ICD-10-CM | POA: Diagnosis present

## 2013-11-16 DIAGNOSIS — E785 Hyperlipidemia, unspecified: Secondary | ICD-10-CM | POA: Diagnosis present

## 2013-11-16 DIAGNOSIS — I5033 Acute on chronic diastolic (congestive) heart failure: Principal | ICD-10-CM | POA: Diagnosis present

## 2013-11-16 DIAGNOSIS — Z6841 Body Mass Index (BMI) 40.0 and over, adult: Secondary | ICD-10-CM

## 2013-11-16 DIAGNOSIS — D72820 Lymphocytosis (symptomatic): Secondary | ICD-10-CM | POA: Diagnosis present

## 2013-11-16 DIAGNOSIS — Z23 Encounter for immunization: Secondary | ICD-10-CM

## 2013-11-16 LAB — BASIC METABOLIC PANEL
BUN: 7 mg/dL (ref 6–23)
CO2: 30 mEq/L (ref 19–32)
Calcium: 9.6 mg/dL (ref 8.4–10.5)
Chloride: 100 mEq/L (ref 96–112)
Creatinine, Ser: 0.66 mg/dL (ref 0.50–1.10)
GFR calc Af Amer: >90 mL/min (ref >90)
Glucose, Bld: 184 mg/dL — ABNORMAL HIGH (ref 70–99)
POTASSIUM: 4.1 meq/L (ref 3.7–5.3)
Sodium: 141 mEq/L (ref 137–147)

## 2013-11-16 LAB — CBC
HEMATOCRIT: 37.7 % (ref 36.0–46.0)
Hemoglobin: 11.3 g/dL — ABNORMAL LOW (ref 12.0–15.0)
MCH: 23.6 pg — ABNORMAL LOW (ref 26.0–34.0)
MCHC: 30 g/dL (ref 30.0–36.0)
MCV: 78.9 fL (ref 78.0–100.0)
PLATELETS: 196 10*3/uL (ref 150–400)
RBC: 4.78 MIL/uL (ref 3.87–5.11)
RDW: 18.6 % — AB (ref 11.5–15.5)
WBC: 13.4 10*3/uL — ABNORMAL HIGH (ref 4.0–10.5)

## 2013-11-16 LAB — URINALYSIS, ROUTINE W REFLEX MICROSCOPIC
BILIRUBIN URINE: NEGATIVE
Glucose, UA: NEGATIVE mg/dL
HGB URINE DIPSTICK: NEGATIVE
Ketones, ur: NEGATIVE mg/dL
Leukocytes, UA: NEGATIVE
Nitrite: NEGATIVE
Protein, ur: 100 mg/dL — AB
Specific Gravity, Urine: 1.01 (ref 1.005–1.030)
Urobilinogen, UA: 1 mg/dL (ref 0.0–1.0)
pH: 7 (ref 5.0–8.0)

## 2013-11-16 LAB — POCT I-STAT TROPONIN I: TROPONIN I, POC: 0.02 ng/mL (ref 0.00–0.08)

## 2013-11-16 LAB — POCT PREGNANCY, URINE: Preg Test, Ur: NEGATIVE

## 2013-11-16 LAB — URINE MICROSCOPIC-ADD ON

## 2013-11-16 LAB — PRO B NATRIURETIC PEPTIDE: Pro B Natriuretic peptide (BNP): 1022 pg/mL — ABNORMAL HIGH (ref 0–125)

## 2013-11-16 MED ORDER — MORPHINE SULFATE 4 MG/ML IJ SOLN
INTRAMUSCULAR | Status: AC
  Filled 2013-11-16: qty 1

## 2013-11-16 MED ORDER — FUROSEMIDE 10 MG/ML IJ SOLN
20.0000 mg | Freq: Once | INTRAMUSCULAR | Status: AC
  Administered 2013-11-16: 20 mg via INTRAVENOUS
  Filled 2013-11-16: qty 2

## 2013-11-16 MED ORDER — MORPHINE SULFATE 4 MG/ML IJ SOLN
4.0000 mg | Freq: Once | INTRAMUSCULAR | Status: AC
  Administered 2013-11-16: 4 mg via INTRAVENOUS
  Filled 2013-11-16: qty 1

## 2013-11-16 MED ORDER — ASPIRIN 81 MG PO CHEW
324.0000 mg | CHEWABLE_TABLET | Freq: Once | ORAL | Status: AC
  Administered 2013-11-16: 324 mg via ORAL
  Filled 2013-11-16: qty 4

## 2013-11-16 MED ORDER — HYDRALAZINE HCL 25 MG PO TABS
25.0000 mg | ORAL_TABLET | Freq: Once | ORAL | Status: AC
  Administered 2013-11-16: 25 mg via ORAL
  Filled 2013-11-16: qty 1

## 2013-11-16 NOTE — ED Notes (Signed)
Attempted to give report 

## 2013-11-16 NOTE — ED Provider Notes (Signed)
CSN: [id number]     Arrival date & time 11/16/13  1611 History   First MD Initiated Contact with Patient 11/16/13 1649     Chief Complaint  Patient presents with  . Chest Pain  . Shortness of Breath   (Consider location/radiation/quality/duration/timing/severity/associated sxs/prior Treatment) HPI Comments: Pt with hx of DM, HTN comes in with cc of chest pain, dib - echo in 2014, EF 55%. Pt started having some cough - intermittently productive, and some pain with swallowing about 5 days ago. She then started having some chest pain - left sided, worse with swallowing, constant, difficult to characterize, but not pressure like or sharp. Pt now has dyspnea on exertion. No hx of CHF, CAD - had a stress few years back according to her.  Patient is a 48 y.o. female presenting with chest pain and shortness of breath. The history is provided by the patient.  Chest Pain Associated symptoms: cough and shortness of breath   Associated symptoms: no abdominal pain, no fever, no headache, no nausea, no palpitations and not vomiting   Shortness of Breath Associated symptoms: chest pain and cough   Associated symptoms: no abdominal pain, no fever, no headaches, no neck pain, no vomiting and no wheezing     Past Medical History  Diagnosis Date  . Diabetes mellitus   . Hypertension     Does not see a cardiologist  . Sleep apnea     Uses CPAP   Past Surgical History  Procedure Laterality Date  . Breast surgery      cyst right breast  . Anterior cervical decomp/discectomy fusion  08/28/2012    Procedure: ANTERIOR CERVICAL DECOMPRESSION/DISCECTOMY FUSION 2 LEVELS;  Surgeon: Clydene Fake, MD;  Location: [facility];  Service: Neurosurgery;  Laterality: N/A;  Cervical four-five, cervical six-seven Anterior cervical decompression/diskectomy/fusion/LifeNet Bone/Trestle plate   Family History  Problem Relation Age of Onset  . Heart disease Mother 23  . Diabetes Mother   . Hypertension Mother   .  Heart disease Paternal Aunt   . Cancer Neg Hx   . Stroke Neg Hx   . Alcohol abuse Neg Hx    History  Substance Use Topics  . Smoking status: Never Smoker   . Smokeless tobacco: Not on file  . Alcohol Use: Yes     Comment: socially on holidays   OB History   Grav Para Term Preterm Abortions TAB SAB Ect Mult Living                 Review of Systems  Constitutional: Positive for chills and activity change. Negative for fever.  Respiratory: Positive for cough and shortness of breath. Negative for wheezing.   Cardiovascular: Positive for chest pain. Negative for palpitations and leg swelling.  Gastrointestinal: Negative for nausea, vomiting and abdominal pain.  Genitourinary: Negative for dysuria.  Musculoskeletal: Negative for neck pain.  Neurological: Negative for headaches.    Allergies  Review of patient's allergies indicates no known allergies.  Home Medications   Current Outpatient Rx  Name  Route  Sig  Dispense  Refill  . ibuprofen (ADVIL,MOTRIN) 200 MG tablet   Oral   Take 400 mg by mouth every 8 (eight) hours as needed for headache or moderate pain.         Marland Kitchen lisinopril (PRINIVIL,ZESTRIL) 30 MG tablet   Oral   Take 30 mg by mouth 2 (two) times daily.         . metFORMIN (GLUCOPHAGE) 500 MG tablet  Oral   Take 500 mg by mouth 2 (two) times daily with a meal.          . traMADol (ULTRAM) 50 MG tablet   Oral   Take 50 mg by mouth every 6 (six) hours as needed for moderate pain.          BP 190/91  Pulse 89  Temp(Src) 98.9 F (37.2 C)  Resp 22  Ht 5' 2.5" (1.588 m)  Wt 249 lb (112.946 kg)  BMI 44.79 kg/m2  SpO2 94%  LMP 10/21/2013 Physical Exam  Nursing note and vitals reviewed. Constitutional: She is oriented to person, place, and time. She appears well-developed and well-nourished.  HENT:  Head: Normocephalic and atraumatic.  Eyes: EOM are normal. Pupils are equal, round, and reactive to light.  Neck: Neck supple.  Cardiovascular: Normal  rate, regular rhythm and normal heart sounds.   No murmur heard. Pulmonary/Chest: Effort normal. No respiratory distress. She has no wheezes. She has no rales. She exhibits no tenderness.  Abdominal: Soft. She exhibits no distension. There is no tenderness. There is no rebound and no guarding.  Neurological: She is alert and oriented to person, place, and time.  Skin: Skin is warm and dry.    ED Course  Procedures (including critical care time) Labs Review Labs Reviewed  URINALYSIS, ROUTINE W REFLEX MICROSCOPIC - Abnormal; Notable for the following:    Protein, ur 100 (*)    All other components within normal limits  URINE MICROSCOPIC-ADD ON - Abnormal; Notable for the following:    Bacteria, UA FEW (*)    All other components within normal limits  CBC  BASIC METABOLIC PANEL  PRO B NATRIURETIC PEPTIDE  POCT PREGNANCY, URINE   Imaging Review Dg Chest 2 View  11/16/2013   CLINICAL DATA:  Mid chest pain, especially when swallowing. Shortness of breath. Dry cough. Current history of diabetes and hypertension.  EXAM: CHEST  2 VIEW  COMPARISON:  07/26/2012, 05/05/2010, 12/07/2009.  FINDINGS: Cardiac silhouette moderately enlarged but stable. Pulmonary venous hypertension with mild interstitial edema as evidenced by Charyl DancerKerley B lines. No confluent airspace consolidation. Linear atelectasis in the inferior left upper lobe. No visible pleural effusions. Degenerative changes involving the thoracic spine.  IMPRESSION: Stable moderate cardiomegaly. Mild diffuse interstitial pulmonary edema consistent with mild CHF.   Electronically Signed   By: Hulan Saashomas  Lawrence M.D.   On: 11/16/2013 17:57    EKG Interpretation    Date/Time:  Monday November 16 2013 16:14:13 EST Ventricular Rate:  99 PR Interval:  164 QRS Duration: 82 QT Interval:  364 QTC Calculation: 467 R Axis:   42 Text Interpretation:  Normal sinus rhythm ST \\T \ T wave abnormality, consider lateral ischemia Prolonged QT Abnormal ECG  Confirmed by Rhunette CroftNANAVATI, MD, Manley Fason ([id number]) on 11/16/2013 6:46:27 PM            MDM  No diagnosis found.  Pt comes in with cc of chest pain, dib, cough. Dyspnea is exertional. Chest pain is atypical - HEART score is 4 if the troponin is negative. Cough - but clear lung exams  Differential diagnosis includes: ACS syndrome CHF exacerbation Valvular disorder Myocarditis Pericardial effusion Pneumonia Pleural effusion Pulmonary edema PE Anemia  Based on the Xray - there is definitely some pulm edema and possible CHF. BP is elevated at arrival - MAp AROUND 115.  Pt has HTN emergency vs. CHF.  Given that the symptoms are exertional, i think HTN emergency is less likely and new onset CHF is  more likely. We gave lasix 20 mg ivp - now BP is 170/104 - i think the initial diastolic BP is probably an error. Given possible new onset CHF - avoiding labetalol. Hydralazine po 25 mh ordered. Pt responding to lasix well. She has no chest pain, dib at rest. Will admit for evaluation of pulmonary edema and CHF and BP optimization.    Derwood Kaplan, MD 11/16/13 2148

## 2013-11-16 NOTE — ED Notes (Signed)
Pt placed on 2L 02, pt's 02 sats reading 85% on RA. Pt denies SOB.

## 2013-11-16 NOTE — ED Notes (Signed)
EDP & Resident at [facility]. Ultrasound at [facility].

## 2013-11-16 NOTE — ED Notes (Signed)
Pt given [place] sandwich per permission from sign and held orders.

## 2013-11-16 NOTE — ED Notes (Signed)
Pt still in radiology.

## 2013-11-16 NOTE — ED Notes (Signed)
Ambulated patient in hallway. O2 saturation was 90-97%. HR 88-103.

## 2013-11-16 NOTE — ED Notes (Signed)
Admitting MD at BS.  

## 2013-11-16 NOTE — ED Notes (Signed)
Patient transported to X-ray 

## 2013-11-16 NOTE — ED Notes (Signed)
[place] set up at bedside, MD will start US guided IV.

## 2013-11-16 NOTE — ED Notes (Signed)
Attempted to gain IV access and draw labs, unsuccessful.

## 2013-11-16 NOTE — ED Notes (Signed)
Transporting Patient to neqw room assignment.

## 2013-11-16 NOTE — ED Notes (Addendum)
Pt presents from home via POV with c/o new onset chest pain that began yesterday morning. Pt reports pain is in upper chest radiating through to the back. Pt reports that it feels like it is difficult to swallow. No drooling, pt able to control secretions. Pt also c/o DOE and cough x4 days.

## 2013-11-17 ENCOUNTER — Encounter (HOSPITAL_COMMUNITY): Payer: Self-pay | Admitting: Family Medicine

## 2013-11-17 DIAGNOSIS — R079 Chest pain, unspecified: Secondary | ICD-10-CM

## 2013-11-17 DIAGNOSIS — E119 Type 2 diabetes mellitus without complications: Secondary | ICD-10-CM

## 2013-11-17 DIAGNOSIS — E785 Hyperlipidemia, unspecified: Secondary | ICD-10-CM

## 2013-11-17 DIAGNOSIS — I509 Heart failure, unspecified: Secondary | ICD-10-CM

## 2013-11-17 DIAGNOSIS — E669 Obesity, unspecified: Secondary | ICD-10-CM

## 2013-11-17 DIAGNOSIS — I10 Essential (primary) hypertension: Secondary | ICD-10-CM

## 2013-11-17 LAB — LIPID PANEL
Cholesterol: 169 mg/dL (ref 0–200)
HDL: 48 mg/dL (ref >39)
LDL Cholesterol: 99 mg/dL (ref 0–99)
TRIGLYCERIDES: 109 mg/dL (ref <150)
Total CHOL/HDL Ratio: 3.5 RATIO
VLDL: 22 mg/dL (ref 0–40)

## 2013-11-17 LAB — CBC
HCT: 38.3 % (ref 36.0–46.0)
Hemoglobin: 11.6 g/dL — ABNORMAL LOW (ref 12.0–15.0)
MCH: 23.7 pg — ABNORMAL LOW (ref 26.0–34.0)
MCHC: 30.3 g/dL (ref 30.0–36.0)
MCV: 78.2 fL (ref 78.0–100.0)
PLATELETS: 219 10*3/uL (ref 150–400)
RBC: 4.9 MIL/uL (ref 3.87–5.11)
RDW: 18.6 % — ABNORMAL HIGH (ref 11.5–15.5)
WBC: 14.8 10*3/uL — AB (ref 4.0–10.5)

## 2013-11-17 LAB — TROPONIN I
Troponin I: <0.3 ng/mL (ref <0.30)
Troponin I: <0.3 ng/mL (ref <0.30)
Troponin I: <0.3 ng/mL (ref <0.30)

## 2013-11-17 LAB — HEMOGLOBIN A1C
HEMOGLOBIN A1C: 7.5 % — AB (ref <5.7)
MEAN PLASMA GLUCOSE: 169 mg/dL — AB (ref <117)

## 2013-11-17 LAB — RAPID URINE DRUG SCREEN, HOSP PERFORMED
AMPHETAMINES: NOT DETECTED
BARBITURATES: NOT DETECTED
BENZODIAZEPINES: NOT DETECTED
Cocaine: NOT DETECTED
Opiates: POSITIVE — AB
TETRAHYDROCANNABINOL: POSITIVE — AB

## 2013-11-17 LAB — GLUCOSE, CAPILLARY
GLUCOSE-CAPILLARY: 198 mg/dL — AB (ref 70–99)
Glucose-Capillary: 186 mg/dL — ABNORMAL HIGH (ref 70–99)
Glucose-Capillary: 171 mg/dL — ABNORMAL HIGH (ref 70–99)
Glucose-Capillary: 165 mg/dL — ABNORMAL HIGH (ref 70–99)
Glucose-Capillary: 215 mg/dL — ABNORMAL HIGH (ref 70–99)

## 2013-11-17 LAB — BASIC METABOLIC PANEL
BUN: 9 mg/dL (ref 6–23)
CALCIUM: 8.9 mg/dL (ref 8.4–10.5)
CO2: 30 mEq/L (ref 19–32)
Chloride: 97 mEq/L (ref 96–112)
Creatinine, Ser: 0.71 mg/dL (ref 0.50–1.10)
GFR calc Af Amer: >90 mL/min (ref >90)
GFR calc non Af Amer: >90 mL/min (ref >90)
GLUCOSE: 234 mg/dL — AB (ref 70–99)
POTASSIUM: 3.9 meq/L (ref 3.7–5.3)
Sodium: 139 mEq/L (ref 137–147)

## 2013-11-17 LAB — INFLUENZA PANEL BY PCR (TYPE A & B)
H1N1 flu by pcr: NOT DETECTED
INFLAPCR: NEGATIVE
INFLBPCR: NEGATIVE

## 2013-11-17 MED ORDER — ATORVASTATIN CALCIUM 40 MG PO TABS
40.0000 mg | ORAL_TABLET | Freq: Every day | ORAL | Status: DC
  Administered 2013-11-17 – 2013-11-18 (×3): 40 mg via ORAL
  Filled 2013-11-17 (×4): qty 1

## 2013-11-17 MED ORDER — HEPARIN SODIUM (PORCINE) 5000 UNIT/ML IJ SOLN
5000.0000 [IU] | Freq: Three times a day (TID) | INTRAMUSCULAR | Status: DC
  Administered 2013-11-17 – 2013-11-19 (×6): 5000 [IU] via SUBCUTANEOUS
  Filled 2013-11-17 (×9): qty 1

## 2013-11-17 MED ORDER — LISINOPRIL 20 MG PO TABS
30.0000 mg | ORAL_TABLET | Freq: Every day | ORAL | Status: DC
  Administered 2013-11-17: 30 mg via ORAL
  Filled 2013-11-17 (×2): qty 1

## 2013-11-17 MED ORDER — CARVEDILOL 12.5 MG PO TABS
12.5000 mg | ORAL_TABLET | Freq: Two times a day (BID) | ORAL | Status: DC
  Administered 2013-11-17: 12.5 mg via ORAL
  Filled 2013-11-17 (×3): qty 1

## 2013-11-17 MED ORDER — ASPIRIN EC 81 MG PO TBEC
81.0000 mg | DELAYED_RELEASE_TABLET | Freq: Every day | ORAL | Status: DC
  Administered 2013-11-17 – 2013-11-19 (×3): 81 mg via ORAL
  Filled 2013-11-17 (×3): qty 1

## 2013-11-17 MED ORDER — FUROSEMIDE 10 MG/ML IJ SOLN
40.0000 mg | Freq: Every day | INTRAMUSCULAR | Status: DC
  Administered 2013-11-17: 02:00:00 40 mg via INTRAVENOUS
  Filled 2013-11-17 (×2): qty 4

## 2013-11-17 MED ORDER — ACETAMINOPHEN 650 MG RE SUPP: 650.0000 mg | Freq: Four times a day (QID) | RECTAL | Status: DC | PRN

## 2013-11-17 MED ORDER — MORPHINE SULFATE 2 MG/ML IJ SOLN
1.0000 mg | INTRAMUSCULAR | Status: DC | PRN
  Administered 2013-11-17: 1 mg via INTRAVENOUS
  Filled 2013-11-17 (×2): qty 1

## 2013-11-17 MED ORDER — INSULIN ASPART 100 UNIT/ML ~~LOC~~ SOLN
0.0000 [IU] | Freq: Three times a day (TID) | SUBCUTANEOUS | Status: DC
  Administered 2013-11-17 – 2013-11-18 (×5): 3 [IU] via SUBCUTANEOUS
  Administered 2013-11-18: 5 [IU] via SUBCUTANEOUS
  Administered 2013-11-19: 3 [IU] via SUBCUTANEOUS

## 2013-11-17 MED ORDER — ONDANSETRON HCL 4 MG PO TABS: 4.0000 mg | ORAL_TABLET | Freq: Four times a day (QID) | ORAL | Status: DC | PRN

## 2013-11-17 MED ORDER — CARVEDILOL 3.125 MG PO TABS: 3.1250 mg | ORAL_TABLET | Freq: Two times a day (BID) | ORAL | Status: DC

## 2013-11-17 MED ORDER — NITROGLYCERIN 0.4 MG SL SUBL: 0.4000 mg | SUBLINGUAL_TABLET | SUBLINGUAL | Status: DC | PRN

## 2013-11-17 MED ORDER — FUROSEMIDE 10 MG/ML IJ SOLN
40.0000 mg | Freq: Two times a day (BID) | INTRAMUSCULAR | Status: DC
  Administered 2013-11-17 – 2013-11-19 (×4): 40 mg via INTRAVENOUS
  Filled 2013-11-17 (×6): qty 4

## 2013-11-17 MED ORDER — ONDANSETRON HCL 4 MG/2ML IJ SOLN: 4.0000 mg | Freq: Four times a day (QID) | INTRAMUSCULAR | Status: DC | PRN

## 2013-11-17 MED ORDER — HYDRALAZINE HCL 20 MG/ML IJ SOLN
10.0000 mg | Freq: Four times a day (QID) | INTRAMUSCULAR | Status: DC | PRN
  Administered 2013-11-17: 10 mg via INTRAVENOUS
  Filled 2013-11-17: qty 1

## 2013-11-17 MED ORDER — CARVEDILOL 3.125 MG PO TABS
3.1250 mg | ORAL_TABLET | Freq: Two times a day (BID) | ORAL | Status: DC
  Administered 2013-11-17 – 2013-11-18 (×2): 3.125 mg via ORAL
  Filled 2013-11-17 (×4): qty 1

## 2013-11-17 MED ORDER — ACETAMINOPHEN 325 MG PO TABS
650.0000 mg | ORAL_TABLET | Freq: Four times a day (QID) | ORAL | Status: DC | PRN
  Administered 2013-11-17 – 2013-11-19 (×5): 650 mg via ORAL
  Filled 2013-11-17 (×7): qty 2

## 2013-11-17 NOTE — Progress Notes (Signed)
The patient's EKG was placed in her chart  

## 2013-11-17 NOTE — Progress Notes (Signed)
Family Medicine Teaching Service Daily Progress Note Intern Pager: [phone]  Patient name: [name] M Steele Medical record number: [id number] Date of birth: 07/03/1966 Age: 48 y.o. Gender: female  Primary Care Provider: Lora Steele, Jamie C, MD Consultants: Cardiology  Code Status: No CPR, but ok to intubate   Pt Overview and Major Events to Date:  1/12: New HF; chest pain - ruleout  Assessment and Plan: [name] CourseM Pearce is a 48 y.o. female presenting with Chest pain and SOB. PMH is significant for Uncontrolled HTN, DM2, Obesity, Sleep apnea, and HLD. New HF on admission, and need for MI rule-out.   # Chest Pain: Concern for ACS due to quality/location of pain and multiple risk factors (Uncontrolled HTN, DM, HLD, Hx of Cocaine use). DDx includes: Flu, PNA, GERD, Unlikely aortic dissection w/ normal mediastinum on CXR and equal BP in UE. EKG 1/12: NSR w/ Twave inversion in V6 & flattening in V5 consistent w/ previous EKG. Pain resolved w/ Morphine  - Monitor on tele; Repeat EKG:NSR w/ inverted T waves V4-6 - Troponins Neg x 2; istat Trop Neg in ED  - UDS: Positive Opiates and THC - Rapid flu: Neg - ASA 324 given   # HF: New onset w/ BNP 1022; Inc Dyspnea x 5757yrs, Orthopnea, Uncontrolled HTN, 1+ LE edema. ECHO 07/2012 showed EF55-60%  - CXR: Stable moderate cardiomegaly. Mild diffuse interstitial pulmonary edema consistent with mild CHF.  - elevated BNP which could point in that direction  - ECHO: today - Strict I&O: Net -2.7; & Daily Wt Dec 6lbs - Consulting cardiology: New HF; EKG changes   -Coreg 12.5 BID  - Lasix 40 BID  - nuclear scan post ECHO  # HTN: Reports uncontrolled BP up to 200s/100s over the past year (No PCP visits due to financial difficulties)  - Lisinopril 30 qd (home dose 30 BID). .  - Hydralazine prn for BP > 190/110  - Coreg started as above; Consider Spironolactone pending BP control w/ BB and new EF   # DM  - Holding Metformin  - A1c Pending Latest A1C of 8.8 in 2011   - SSI TID w/ meals   # HLD  - LDL 99 (07/2012); CV Risk 20%  - Start Lipitor 40mg    FEN/GI:  Diet: Steele salt; SLIV  Prophylaxis: Heparin   Disposition: Admit to tele; Attending Funches; Discharge home pending CP rule-out and initiation of HF managment  Subjective: No complaints; Denies CP, SOB.   Objective: Temp:  [97.9 F (36.6 C)-99 F (37.2 C)] 97.9 F (36.6 C) (01/13 0428) Pulse Rate:  [85-100] 89 (01/13 0428) Resp:  [17-29] 22 (01/13 0428) BP: (162-218)/(68-114) 162/87 mmHg (01/13 0428) SpO2:  [91 %-97 %] 96 % (01/13 0428) Weight:  [239 lb 12.8 oz (108.773 kg)-249 lb (112.946 kg)] 239 lb 12.8 oz (108.773 kg) (01/13 0428) Physical Exam: General: Obese Female, NAD  HEENT: PERRL, MMM, TM clear  Cardiovascular: RRR no m/r/g; distant heart sounds due to body habitus; No JVD or HJR, no S3 appreciated  Respiratory: CTAB; normal respiratory effort  Abdomen: Soft, protuberant, NT/ND  Extremities: WWP, 2+ LE pitting edema to 1/3 shin  Laboratory:  Recent Labs Lab 11/16/13 1901 11/17/13 0355  WBC 13.4* 14.8*  HGB 11.3* 11.6*  HCT 37.7 38.3  PLT 196 219    Recent Labs Lab 11/16/13 1901 11/17/13 0220  NA 141 139  K 4.1 3.9  CL 100 97  CO2 30 30  BUN 7 9  CREATININE 0.66 0.71  CALCIUM  9.6 8.9  GLUCOSE 184* 234*    BNP    Component Value Date/Time   PROBNP 1022.0* 11/16/2013 1901     Recent Labs Lab 11/17/13 0220 11/17/13 0515  TROPONINI <0.30 <0.30   Imaging/Diagnostic Tests: CXR 1/12  Cardiac silhouette moderately enlarged but stable. Pulmonary venous  hypertension with mild interstitial edema as evidenced by Charyl Dancer B  lines. No confluent airspace consolidation. Linear atelectasis in  the inferior left upper lobe. No visible pleural effusions.  Degenerative changes involving the thoracic spine.  IMPRESSION:  Stable moderate cardiomegaly. Mild diffuse interstitial pulmonary  edema consistent with mild CHF.  Jamie Low, MD 11/17/2013, 8:09  AM PGY-1, [facility] Family Medicine FPTS Intern pager: [phone], text pages welcome

## 2013-11-17 NOTE — Progress Notes (Signed)
The patient arrived to 4E06 from the ED at 2330.  She was oriented to the unit and placed on telemetry.  VS were taken and the patient was assessed.  She is A&Ox4 and is complaining of throat and ear pain.  She is not complaining of chest pain at this time.  She is a low fall risk.  The call bell was explained and placed within reach.

## 2013-11-17 NOTE — Progress Notes (Signed)
Family Medicine Teaching Service Service Pager: [phone]  [name] 48 y.o. female  MRN: [id number]  DOB: 06-Jan-1966    I was called to patient room due to nursing concerns for suicidality in the past.  Had a long discussion with the patient and she currently feels overwhelmed due to losing her job which has resulted in her relying on her daughter for her needs.  She says that over the past year she has had intermittent thoughts of hurting herself, denies any plans, denies being actively suicidal.  Reports that she would call for help if she ever feared she would follow through on those thoughts.  Her daughter is extremely supportive and they have discussed these thoughts openly multiple times.  She also reports she would call 911 if felt she was going to act on these thoughts.   During the discussion the patient expressed interest in having a organized activity regimen that would likely be beneficial both for her physical and emotional health.  She would likely be a great candidate for outpatient cardiac rehabilitation.  This should be addressed at followup.    She denied wanting to start any medications for her mood and reports feeling well supported by her daughter.  --- DISPOSITION: Plan to further evaluate her depression as an outpatient.  Consider cardiac rehabilitation as an outpatient.  Andrena Mews, DO [facility] Resident - PGY-3 11/17/2013 4:19 PM

## 2013-11-17 NOTE — Progress Notes (Signed)
UR completed 

## 2013-11-17 NOTE — Consult Note (Signed)
CARDIOLOGY CONSULT NOTE  Patient ID: [name], MRN: [id number], DOB/AGE: 47/11/1965 47 y.o. Admit date: 11/16/2013 Date of Consult: 11/17/2013  Primary Physician: Lora PaulaFUNCHES, JOSALYN C, MD Primary Cardiologist: none Referring Physician: Dr Armen PickupFunches  Chief Complaint: Shortness of Breath Reason for Consultation: Congestive Heart Failure  HPI: 48 y.o. female w/ PMHx significant for HTN and diabetes who [facility]17[facility]2013 with complaints of shortness of breath. The patient has a history of hypertension and diabetes. Her blood pressure has been uncontrolled for the past year. The patient has limited resources. She had lost her job and was unable to have access to medication. She reports very high blood pressure readings at home. She's had headaches associated with this. She hasn't regained her job and is now working at [employer] at [employer].  Over the past 4-5 days, she reports increasing dyspnea with exertion. Her symptoms now occur at rest and with minimal exertion. She complains of orthopnea. She's had a nonproductive cough. Mucinex has helped a little bit. She's had no fevers, chills, sinus congestion, myalgias, or other symptoms of flu or infection. She has developed chest pressure radiating to the back. She is much more comfortable this morning after receiving intravenous Lasix last night. She's had no leg edema. She does admit to PND. The night prior to admission she had to sit on the bedside for much of the night.  The patient has no personal history of coronary artery disease or congestive heart failure. She is a lifetime nonsmoker. She drinks alcohol and smokes marijuana on occasion. She is single and lives with her daughter. Her father had a myocardial infarction in his 7350s.  Past Medical History  Diagnosis Date  . Diabetes mellitus   . Hypertension     Does not see a cardiologist  . Sleep apnea     Uses CPAP      Surgical History:  Past Surgical  History  Procedure Laterality Date  . Breast surgery      cyst right breast  . Anterior cervical decomp/discectomy fusion  08/28/2012    Procedure: ANTERIOR CERVICAL DECOMPRESSION/DISCECTOMY FUSION 2 LEVELS;  Surgeon: Clydene FakeJames R Hirsch, MD;  Location: [facility];  Service: Neurosurgery;  Laterality: N/A;  Cervical four-five, cervical six-seven Anterior cervical decompression/diskectomy/fusion/LifeNet Bone/Trestle plate     Home Meds: Prior to Admission medications   Medication Sig Start Date End Date Taking? Authorizing Provider  ibuprofen (ADVIL,MOTRIN) 200 MG tablet Take 400 mg by mouth every 8 (eight) hours as needed for headache or moderate pain.   Yes Historical Provider, MD  lisinopril (PRINIVIL,ZESTRIL) 30 MG tablet Take 30 mg by mouth 2 (two) times daily.   Yes Historical Provider, MD  metFORMIN (GLUCOPHAGE) 500 MG tablet Take 500 mg by mouth 2 (two) times daily with a meal.  08/11/12  Yes Macy MisKim K Briscoe, MD  traMADol (ULTRAM) 50 MG tablet Take 50 mg by mouth every 6 (six) hours as needed for moderate pain.   Yes Historical Provider, MD    Inpatient Medications:  . aspirin EC  81 mg Oral Daily  . atorvastatin  40 mg Oral q1800  . furosemide  40 mg Intravenous Daily  . heparin  5,000 Units Subcutaneous Q8H  . insulin aspart  0-15 Units Subcutaneous TID WC  . lisinopril  30 mg Oral Daily      Allergies: No Known Allergies  History   Social History  . Marital Status: Single    Spouse Name: N/A  Number of Children: N/A  . Years of Education: N/A   Occupational History  . Not on file.   Social History Main Topics  . Smoking status: Never Smoker   . Smokeless tobacco: Not on file  . Alcohol Use: Yes     Comment: socially on holidays  . Drug Use: Yes    Special: Marijuana     Comment: Last used yesterday  . Sexual Activity: Yes    Birth Control/ Protection: None   Other Topics Concern  . Not on file   Social History Narrative   Lives with husband.  Has one  daughter.  Work full time at [employer].   Some high school education.        Mother's history is unknown patient raised by paternal grandmother      Family History  Problem Relation Age of Onset  . Heart disease Paternal Grandmother 12  . Diabetes Paternal Grandmother   . Hypertension Paternal Grandmother   . Heart disease Paternal Aunt   . Cancer Neg Hx   . Stroke Neg Hx   . Alcohol abuse Neg Hx   . Heart disease Father 31    MI     Review of Systems: General: negative for chills, fever, night sweats or weight changes.  ENT: negative for rhinorrhea or epistaxis Cardiovascular: negative for palpitations, lightheadedness, or syncope. Otherwise see history of present illness Dermatological: negative for rash Respiratory: Negative for hemoptysis. Otherwise see history of present illness GI: negative for nausea, vomiting, diarrhea, bright red blood per rectum, melena, or hematemesis GU: no hematuria, urgency, or frequency Neurologic: negative for visual changes, syncope, headache, or dizziness Heme: no easy bruising or bleeding Endo: negative for excessive thirst, thyroid disorder, or flushing Musculoskeletal: negative for joint pain or swelling, negative for myalgias All other systems reviewed and are otherwise negative except as noted above.  Physical Exam: Blood pressure 162/87, pulse 89, temperature 97.9 F (36.6 C), temperature source Oral, resp. rate 22, height 5' 2.5" (1.588 m), weight 108.773 kg (239 lb 12.8 oz), last menstrual period 10/21/2013, SpO2 96.00%. General: Well developed, well nourished, alert and oriented, obese woman in no acute distress. HEENT: Normocephalic, atraumatic, sclera non-icteric, no xanthomas, nares are without discharge.  Neck: Supple. Carotids 2+ without bruits. JVP normal but difficult to visualize secondary to body habitus Lungs: There are faint inspiratory rales bilaterally. Breathing is unlabored. Heart: RRR with normal S1 and S2. No  murmurs, rubs, or gallops appreciated. Heart sounds are distant Abdomen: Soft, non-tender, non-distended with normoactive bowel sounds. No hepatomegaly. No rebound/guarding. No obvious abdominal masses. Back: No CVA tenderness Msk:  Strength and tone appear normal for age. Extremities: No clubbing, cyanosis, or edema.  Distal pedal pulses are 2+ and equal bilaterally. Neuro: CNII-XII intact, moves all extremities spontaneously. Psych:  Responds to questions appropriately with a normal affect.    Labs:  Recent Labs  11/17/13 0220 11/17/13 0515  TROPONINI <0.30 <0.30   Lab Results  Component Value Date   WBC 14.8* 11/17/2013   HGB 11.6* 11/17/2013   HCT 38.3 11/17/2013   MCV 78.2 11/17/2013   PLT 219 11/17/2013    Recent Labs Lab 11/17/13 0220  NA 139  K 3.9  CL 97  CO2 30  BUN 9  CREATININE 0.71  CALCIUM 8.9  GLUCOSE 234*   Lab Results  Component Value Date   CHOL 171 07/27/2012   HDL 38* 07/27/2012   LDLCALC 99 07/27/2012   TRIG 172* 07/27/2012  No results found for this basename: DDIMER    Radiology/Studies:  Dg Chest 2 View  11/16/2013   CLINICAL DATA:  Mid chest pain, especially when swallowing. Shortness of breath. Dry cough. Current history of diabetes and hypertension.  EXAM: CHEST  2 VIEW  COMPARISON:  07/26/2012, 05/05/2010, 12/07/2009.  FINDINGS: Cardiac silhouette moderately enlarged but stable. Pulmonary venous hypertension with mild interstitial edema as evidenced by Charyl Dancer B lines. No confluent airspace consolidation. Linear atelectasis in the inferior left upper lobe. No visible pleural effusions. Degenerative changes involving the thoracic spine.  IMPRESSION: Stable moderate cardiomegaly. Mild diffuse interstitial pulmonary edema consistent with mild CHF.   Electronically Signed   By: Hulan Saas M.D.   On: 11/16/2013 17:57    EKG: Normal sinus rhythm with left ventricular hypertrophy and associated repolarization abnormality.  ASSESSMENT AND PLAN:    1. Acute congestive heart failure, suspect diastolic. LV function is not known. Her symptoms are highly typical of congestive heart failure. She is symptomatically improved after IV Lasix. A 2-D echocardiogram is ordered. I suspect hypertensive heart disease as the etiology. The patient is currently on IV diuretics and an ACE inhibitor. She remains markedly hypertensive and I think it is reasonable to start her on a beta blocker. Would choose carvedilol 12.5 mg twice daily. Suspect she will require multiple medications to control her blood pressure. Electrolytes will need to be followed closely.  2. Chest pressure. EKG is nondiagnostic because of left ventricular hypertrophy. She does have significant risk factors for obstructive coronary artery disease. Cardiac enzymes are initially negative. Recommend a pharmacologic nuclear scan for risk stratification. Her body habitus presents challenges for any noninvasive imaging.  3. Malignant hypertension, uncontrolled. Again, suspect this is the etiology of her congestive heart failure. Antihypertensive therapy as outlined above.  4. Obesity. Dietary counseling done  Will follow along with you. Recommend increase furosemide to twice daily dosing. Would check pharmacologic nuclear scan and will followup once her echocardiogram is completed. At carvedilol 12.5 mg twice daily as outlined. Please call if any questions. thx  Enzo Bi  11/17/2013, 9:32 AM

## 2013-11-17 NOTE — Progress Notes (Signed)
FMTS Attending Note  The plan of care was discussed with the resident team. I agree with the assessment and plan as documented by the resident.   Shawan Corella MD 

## 2013-11-17 NOTE — H&P (Signed)
Family Medicine Teaching [facility] Admission History and Physical Service Pager: [phone]  Patient name: ELLISA Steele Medical record number: [id number] Date of birth: July 25, 1966 Age: 48 y.o. Gender: female  Primary Care Provider: Lora Paula, MD Consultants: Cardiology Code Status: No CPR, but ok to intubate   Chief Complaint: Chest pain & SOB  Assessment and Plan: Jamie TEKESHA ALMGREN is a 48 y.o. female presenting with Chest pain and SOB. PMH is significant for Uncontrolled HTN, DM2, Obesity, Sleep apnea, and HLD.  New HF on admission, and need for MI rule-out.   # Chest Pain: Concern for ACS due to quality/location of pain and multiple risk factors (Uncontrolled HTN, DM, HLD, Hx of Cocaine use). DDx includes: Flu, PNA, GERD, Unlikely aortic dissection w/ normal mediastinum on CXR and equal BP in UE. EKG 1/12: NSR w/ Twave inversion in V6 & flattening in V5 consistent w/ previous EKG. Pain resolved w/ Morphine - Monitor on tele; Repeat EKG in AM - Cycle Troponins; istat Trop Neg in ED - UDS: pending - Rapid flu: pending - ASA 324 given  # HF: New onset w/ BNP 1022; Inc Dyspnea x 91yrs, Orthopnea, Uncontrolled HTN, 1+ LE edema. ECHO 07/2012 showed EF55-60%  - CXR: Stable moderate cardiomegaly. Mild diffuse interstitial pulmonary edema consistent with mild CHF. - elevated BNP which could point in that direction - ECHO: tomorrow  - Lasix 40 daily; 20 given in ED w/ good response - Strict I&O & Daily Wt - consider consulting cardiology for ischemic workup testing.    # HTN: Reports uncontrolled BP up to 200s/100s over the past year (No PCP visits due to financial difficulties) - Lisinopril 30 qd (home dose 30 BID). Restart lisinopril 30mg  daily and adjust accordingly.  - Hydralazine prn for BP > 190/110 - Initiate Beta-Blocker pending ECHO if no evidence of CHF; Consider Spironolactone pending BP control w/ BB and new EF  # DM  - Holding Metformin - Check A1c. Latest A1C of  8.8 in 2011 - SSI TID w/ meals  # HLD - LDL 99 (07/2012); CV Risk 20% - Start Lipitor 40mg    FEN/GI:  Diet: Low salt; SLIV Prophylaxis: Heparin  Disposition: Admit to tele; Attending Funches; Discharge home pending CP rule-out and initiation of HF managment  History of Present Illness: [name] is a 48 y.o. female presenting with chest pain. She report "heaviness" chest pain that started this morning and last ~ 4hrs. She describes the pain as starting from the front of her chest and radiating to the back. This was associated with some sweating, but no SOB at that time. However she endorses increasing SOB over the past year with dyspnea with minimal exertion, and orthopnea. She has had uncontrolled BP for the past year due to financial difficulties, and reports BP up to 200/100 when checked periodically. She denies any HA or vision changes, but reports some LE swelling. She use marijuana and alcohol occassionally, denies tobacco use, but admits to use Cocaine in the past (last used ~ 100yrs ago). She reports that her father had MI in his early 52s, along with Fhx of CHF.  She also endorses URI symptoms (ear fullness, sore throat, fatigue, and occasional dry cough for the past week.   Review Of Systems: Per HPI with the following additions:  Otherwise 12 point review of systems was performed and was unremarkable.  Patient Active Problem List   Diagnosis Date Noted  . Chest pain on exertion 11/16/2013  . Pulmonary edema  11/16/2013  . Hypertensive urgency, malignant 11/16/2013  . Acute CHF 11/16/2013  . Degenerative disk disease 08/12/2012  . Hyperlipidemia LDL goal < 100 08/11/2012  . Hypertension 07/26/2012  . Diabetes mellitus, type 2 07/26/2012  . Obesity 07/26/2012  . SOB (shortness of breath) 07/26/2012   Past Medical History: Past Medical History  Diagnosis Date  . Diabetes mellitus   . Hypertension     Does not see a cardiologist  . Sleep apnea     Uses CPAP   Past  Surgical History: Past Surgical History  Procedure Laterality Date  . Breast surgery      cyst right breast  . Anterior cervical decomp/discectomy fusion  08/28/2012    Procedure: ANTERIOR CERVICAL DECOMPRESSION/DISCECTOMY FUSION 2 LEVELS;  Surgeon: Clydene Fake, MD;  Location: [facility];  Service: Neurosurgery;  Laterality: N/A;  Cervical four-five, cervical six-seven Anterior cervical decompression/diskectomy/fusion/LifeNet Bone/Trestle plate   Social History: History  Substance Use Topics  . Smoking status: Never Smoker   . Smokeless tobacco: Not on file  . Alcohol Use: Yes     Comment: socially on holidays   Additional social history:   Please also refer to relevant sections of EMR.  Family History: Family History  Problem Relation Age of Onset  . Heart disease Mother 66  . Diabetes Mother   . Hypertension Mother   . Heart disease Paternal Aunt   . Cancer Neg Hx   . Stroke Neg Hx   . Alcohol abuse Neg Hx    Allergies and Medications: No Known Allergies No current facility-administered medications on file prior to encounter.   Current Outpatient Prescriptions on File Prior to Encounter  Medication Sig Dispense Refill  . ibuprofen (ADVIL,MOTRIN) 200 MG tablet Take 400 mg by mouth every 8 (eight) hours as needed for headache or moderate pain.      Marland Kitchen lisinopril (PRINIVIL,ZESTRIL) 30 MG tablet Take 30 mg by mouth 2 (two) times daily.      . metFORMIN (GLUCOPHAGE) 500 MG tablet Take 500 mg by mouth 2 (two) times daily with a meal.         Objective: BP 192/98  Pulse 96  Temp(Src) 99 F (37.2 C) (Oral)  Resp 23  Ht 5' 2.5" (1.588 m)  Wt 249 lb (112.946 kg)  BMI 44.79 kg/m2  SpO2 94%  LMP 10/21/2013 Exam: General: Obese Female, NAD HEENT: PERRL, MMM, TM clear Cardiovascular: RRR no m/r/g; distant heart sounds due to body habitus; No JVD or HJR, no S3 appreciated Respiratory: CTAB; normal respiratory effort Abdomen: Soft, protuberant, NT/ND Extremities: WWP,  2+ LE pitting edema to 1/3 shin  Labs and Imaging: CBC BMET   Recent Labs Lab 11/16/13 1901  WBC 13.4*  HGB 11.3*  HCT 37.7  PLT 196    Recent Labs Lab 11/16/13 1901  NA 141  K 4.1  CL 100  CO2 30  BUN 7  CREATININE 0.66  GLUCOSE 184*  CALCIUM 9.6     CXR 1/12 Cardiac silhouette moderately enlarged but stable. Pulmonary venous  hypertension with mild interstitial edema as evidenced by Charyl Dancer B  lines. No confluent airspace consolidation. Linear atelectasis in  the inferior left upper lobe. No visible pleural effusions.  Degenerative changes involving the thoracic spine.  IMPRESSION:  Stable moderate cardiomegaly. Mild diffuse interstitial pulmonary  edema consistent with mild CHF.   Wenda Low, MD 11/17/2013, 1:21 AM PGY-1, [facility] Family Medicine FPTS Intern pager: [phone], text pages welcome  Patient seen, examined. Available  data reviewed. Agree with findings, assessment, and plan as outlined by Dr. Gayla DossJoyner.  My additional findings are documented and highlighted above.  Marena ChancyStephanie Anu Stagner, PGY-3 Family Medicine Resident

## 2013-11-17 NOTE — Progress Notes (Signed)
Pt a/o, c/o headache PRN tylenol given as ordered, pt oob ad lib, pt states she has "some shortness of breath with exertion" O2 WNL, pt stable

## 2013-11-17 NOTE — H&P (Signed)
Attending Addendum  I examined the patient and discussed the assessment and plan with Dr. Gwenlyn Saran. I have reviewed the note and agree.  Briefly, 48 yo F with h/o DM2, HTN admitted for chest pain in the setting of 5 days of URI symptoms, 2 days of SOB, 1 day of CP. ProBNP elevated to >1000 consistent with new onset CHF exacerbation. Patient does not smoke tobacco. She is obese. Family h/o CAD in father age 18 and paternal grandmother age 32. Patient does not know her mother or maternal family history.   Today she reports feeling well. She has diuresed well with lasix 20 mg IV x one, then 40 mg IV x one. Trop neg x 3. She has chronic lymphocytosis.   BP 165/75  Pulse 89  Temp(Src) 97.9 F (36.6 C) (Oral)  Resp 22  Ht 5' 2.5" (1.588 m)  Wt 239 lb 12.8 oz (108.773 kg)  BMI 43.13 kg/m2  SpO2 96%  LMP 10/21/2013 General appearance: alert, cooperative, no distress and morbidly obese Lungs: clear to auscultation bilaterally Heart: regular rate and rhythm, S1, S2 normal, no murmur, click, rub or gallop Extremities: edema 2+ bilateral   Lab Results  Component Value Date   HGB 11.6* 11/17/2013   Lab Results  Component Value Date   HGBA1C  Value: 8.8 (NOTE)                                          07/21/2010    EKG: unchanged from previous tracings, normal sinus rhythm, nonspecific ST and T waves changes. ECHO-pending. Consults-cardiology.   A/P  48 yo F with new onset CHF the etiology of which is most likely hypertensive. However, patient is high risk for CAD. She is hemodynamically stable. Normal renal function. Tolerating diuresis. Plan to f/u ECHO. Agree with and appreciate cardiology recommendations. Patient will be started on BB prior to d/c. Continue ACEi, lasix, may add spironolactone depending on degree of systolic dysfunction.   Dispo pending continued clinical improvement and w/u including ECHO and myocardial perfusion study.    Dessa Phi, MD FAMILY MEDICINE TEACHING  SERVICE

## 2013-11-18 ENCOUNTER — Observation Stay (HOSPITAL_COMMUNITY): Payer: BC Managed Care – PPO

## 2013-11-18 DIAGNOSIS — R0602 Shortness of breath: Secondary | ICD-10-CM

## 2013-11-18 DIAGNOSIS — R079 Chest pain, unspecified: Secondary | ICD-10-CM

## 2013-11-18 DIAGNOSIS — I517 Cardiomegaly: Secondary | ICD-10-CM

## 2013-11-18 LAB — GLUCOSE, CAPILLARY
GLUCOSE-CAPILLARY: 204 mg/dL — AB (ref 70–99)
GLUCOSE-CAPILLARY: 153 mg/dL — AB (ref 70–99)
Glucose-Capillary: 200 mg/dL — ABNORMAL HIGH (ref 70–99)
Glucose-Capillary: 170 mg/dL — ABNORMAL HIGH (ref 70–99)

## 2013-11-18 LAB — CBC
HCT: 38.2 % (ref 36.0–46.0)
Hemoglobin: 11.8 g/dL — ABNORMAL LOW (ref 12.0–15.0)
MCH: 23.6 pg — ABNORMAL LOW (ref 26.0–34.0)
MCHC: 30.9 g/dL (ref 30.0–36.0)
MCV: 76.2 fL — AB (ref 78.0–100.0)
PLATELETS: 285 10*3/uL (ref 150–400)
RBC: 5.01 MIL/uL (ref 3.87–5.11)
RDW: 18.8 % — AB (ref 11.5–15.5)
WBC: 14.6 10*3/uL — AB (ref 4.0–10.5)

## 2013-11-18 LAB — BASIC METABOLIC PANEL
BUN: 13 mg/dL (ref 6–23)
CALCIUM: 9 mg/dL (ref 8.4–10.5)
CO2: 30 meq/L (ref 19–32)
CREATININE: 0.76 mg/dL (ref 0.50–1.10)
Chloride: 93 mEq/L — ABNORMAL LOW (ref 96–112)
GFR calc Af Amer: >90 mL/min (ref >90)
GFR calc non Af Amer: >90 mL/min (ref >90)
Glucose, Bld: 170 mg/dL — ABNORMAL HIGH (ref 70–99)
Potassium: 3.5 mEq/L — ABNORMAL LOW (ref 3.7–5.3)
Sodium: 136 mEq/L — ABNORMAL LOW (ref 137–147)

## 2013-11-18 MED ORDER — LISINOPRIL 40 MG PO TABS
40.0000 mg | ORAL_TABLET | Freq: Every day | ORAL | Status: DC
  Administered 2013-11-19: 40 mg via ORAL
  Filled 2013-11-18: qty 1

## 2013-11-18 MED ORDER — TECHNETIUM TC 99M SESTAMIBI GENERIC - CARDIOLITE
10.0000 | Freq: Once | INTRAVENOUS | Status: AC | PRN
  Administered 2013-11-18: 09:00:00 10 via INTRAVENOUS

## 2013-11-18 MED ORDER — MENTHOL 3 MG MT LOZG
1.0000 | LOZENGE | OROMUCOSAL | Status: DC | PRN
  Filled 2013-11-18 (×2): qty 9

## 2013-11-18 MED ORDER — PNEUMOCOCCAL VAC POLYVALENT 25 MCG/0.5ML IJ INJ
0.5000 mL | INJECTION | INTRAMUSCULAR | Status: AC
  Administered 2013-11-19: 0.5 mL via INTRAMUSCULAR
  Filled 2013-11-18: qty 0.5

## 2013-11-18 MED ORDER — TECHNETIUM TC 99M SESTAMIBI GENERIC - CARDIOLITE
30.0000 | Freq: Once | INTRAVENOUS | Status: AC | PRN
  Administered 2013-11-18: 11:00:00 30 via INTRAVENOUS

## 2013-11-18 MED ORDER — TRAMADOL HCL 50 MG PO TABS
50.0000 mg | ORAL_TABLET | Freq: Four times a day (QID) | ORAL | Status: DC | PRN
  Administered 2013-11-18: 50 mg via ORAL
  Filled 2013-11-18: qty 1

## 2013-11-18 MED ORDER — LISINOPRIL 40 MG PO TABS: 40.0000 mg | ORAL_TABLET | Freq: Every day | ORAL | Status: DC

## 2013-11-18 MED ORDER — REGADENOSON 0.4 MG/5ML IV SOLN
0.4000 mg | Freq: Once | INTRAVENOUS | Status: AC
  Administered 2013-11-18: 0.4 mg via INTRAVENOUS
  Filled 2013-11-18: qty 5

## 2013-11-18 MED ORDER — PHENOL 1.4 % MT LIQD
1.0000 | OROMUCOSAL | Status: DC | PRN
  Administered 2013-11-18: 08:00:00 1 via OROMUCOSAL
  Filled 2013-11-18: qty 177

## 2013-11-18 MED ORDER — CARVEDILOL 6.25 MG PO TABS
6.2500 mg | ORAL_TABLET | Freq: Two times a day (BID) | ORAL | Status: DC
  Administered 2013-11-18 – 2013-11-19 (×2): 6.25 mg via ORAL
  Filled 2013-11-18 (×4): qty 1

## 2013-11-18 NOTE — Progress Notes (Signed)
Pt. Continues to complain of throat pain. On call MD notified. New orders received. RN will continue to monitor pt. For changes in condition and administer meds as ordered. Chanah Tidmore, Cheryll Dessert

## 2013-11-18 NOTE — Progress Notes (Signed)
Family Medicine Teaching Service Daily Progress Note Intern Pager: [phone]  Patient name: [name] Medical record number: [id number] Date of birth: 1966-06-26 Age: 48 y.o. Gender: female  Primary Care Provider: Lora Paula, MD Consultants: Cardiology  Code Status: No CPR, but ok to intubate   Pt Overview and Major Events to Date:  1/12: New HF; chest pain - ruleout  Assessment and Plan: [name] is a 48 y.o. female presenting with Chest pain and SOB. PMH is significant for Uncontrolled HTN, DM2, Obesity, Sleep apnea, and HLD. New HF on admission, and need for MI rule-out.   # HF: New onset w/ BNP 1022; Inc Dyspnea x 42yrs, Orthopnea, Uncontrolled HTN, 1+ LE edema. ECHO 07/2012 showed EF55-60%  - CXR: Stable moderate cardiomegaly. Mild diffuse interstitial pulmonary edema consistent with mild CHF.  - elevated BNP which could point in that direction  - ECHO: pending (ordered 1/13) - Strict I&O: Net -3.1; & Daily Wt Dec 15lbs - Consulting cardiology: New HF; EKG changes   -Coreg 6.25 BID  - Lasix 40 BID  - nuclear scan today  # Chest Pain: Concern for ACS due to quality/location of pain and multiple risk factors (Uncontrolled HTN, DM, HLD, Hx of Cocaine use). DDx includes: Flu, PNA, GERD, Unlikely aortic dissection w/ normal mediastinum on CXR and equal BP in UE. EKG 1/12: NSR w/ Twave inversion in V6 & flattening in V5 consistent w/ previous EKG. Pain resolved w/ Morphine  - Monitor on tele; Repeat EKG (1/13) :NSR w/ inverted T waves V4-6 - Troponins Neg x 3; istat Trop Neg in ED  - UDS: Positive Opiates and THC - Rapid flu: Neg - ASA 81 qd   # HTN: Reports uncontrolled BP up to 200s/100s over the past year (No PCP visits due to financial difficulties)  - BP Last 24hrs: (117-165)/(59-75) 148/67 mmHg - Lisinopril 40 qd (home dose 30 BID). .  - Hydralazine prn for BP > 190/110  - Coreg started as above; Consider Spironolactone pending BP control w/ BB and new EF   #  DM  - Holding Metformin  - A1c 7.5  - SSI TID w/ meals   # HLD  - LDL 99 (07/2012); CV Risk 20%  - Start Lipitor 40mg    FEN/GI:  Diet: Low salt; SLIV  Prophylaxis: Heparin   Disposition: Admit to tele; Attending Funches; Discharge home pending CP rule-out and initiation of HF managment  Subjective: No complaints; Denies CP, SOB.   Objective: Temp:  [98.1 F (36.7 C)] 98.1 F (36.7 C) (01/14 4128) Pulse Rate:  [70-89] 70 (01/14 0608) Resp:  [18-20] 18 (01/14 0608) BP: (117-165)/(59-75) 148/67 mmHg (01/14 0608) SpO2:  [92 %-96 %] 92 % (01/14 0608) Weight:  [234 lb 4.8 oz (106.278 kg)] 234 lb 4.8 oz (106.278 kg) (01/14 7867) Physical Exam: General: Obese Female, NAD  HEENT: PERRL, MMM, TM clear  Cardiovascular: RRR no m/r/g; distant heart sounds due to body habitus; No JVD or HJR, no S3 appreciated  Respiratory: CTAB; normal respiratory effort  Abdomen: Soft, protuberant, NT/ND  Extremities: WWP, 2+ LE pitting edema to 1/3 shin  Laboratory:  Recent Labs Lab 11/16/13 1901 11/17/13 0355 11/18/13 0253  WBC 13.4* 14.8* 14.6*  HGB 11.3* 11.6* 11.8*  HCT 37.7 38.3 38.2  PLT 196 219 285    Recent Labs Lab 11/16/13 1901 11/17/13 0220 11/18/13 0253  NA 141 139 136*  K 4.1 3.9 3.5*  CL 100 97 93*  CO2 30 30 30  BUN 7 9 13   CREATININE 0.66 0.71 0.76  CALCIUM 9.6 8.9 9.0  GLUCOSE 184* 234* 170*    BNP    Component Value Date/Time   PROBNP 1022.0* 11/16/2013 1901      Recent Labs Lab 11/17/13 0220 11/17/13 0515 11/17/13 1435  TROPONINI <0.30 <0.30 <0.30   Imaging/Diagnostic Tests: CXR 1/12  Cardiac silhouette moderately enlarged but stable. Pulmonary venous  hypertension with mild interstitial edema as evidenced by Charyl DancerKerley B  lines. No confluent airspace consolidation. Linear atelectasis in  the inferior left upper lobe. No visible pleural effusions.  Degenerative changes involving the thoracic spine.  IMPRESSION:  Stable moderate cardiomegaly. Mild  diffuse interstitial pulmonary  edema consistent with mild CHF.  Wenda LowJames Izzac Rockett, MD 11/18/2013, 7:32 AM PGY-1, [facility] Family Medicine FPTS Intern pager: [phone], text pages welcome

## 2013-11-18 NOTE — Plan of Care (Signed)
Problem: Phase I Progression Outcomes Goal: EF % per last Echo/documented,Core Reminder form on chart Outcome: Completed/Met Date Met:  11/18/13 EF 55 - 60 %

## 2013-11-18 NOTE — Progress Notes (Signed)
FMTS Attending Note  I personally saw and evaluated the patient. The plan of care was discussed with the resident team. I agree with the assessment and plan as documented by the resident.   Patient without complaint, no chest pain, no shortness of breath  1. Congestive Heart Failure Exacerbation - Echo performed today showed EF 55-60% with grade II diastolic dysfunction, patient diuresing well 2. Chest pain with EKG changes- stress test pending, appreciate cardiology input 3. HTN - uncontrolled, increase Coreg to 6.25 daily and Lisinopril 40 mg daily 4. DM/HLD stable  Donnella Sham MD

## 2013-11-18 NOTE — Discharge Summary (Addendum)
Family Medicine Teaching [facility] Discharge Summary  Patient name: [name] Medical record number: [id number] Date of birth: 18-Apr-1966 Age: 48 y.o. Gender: female Date of Admission: 11/16/2013  Date of Discharge: 11/19/13 Admitting Physician: Lora Paula, MD  Primary Care Provider: Lora Paula, MD Consultants: Cardiology  Indication for Hospitalization: Heart Failure  Discharge Diagnoses/Problem List:  1. Acute diastolic Heart Failure 2. HTN 3. DM 4. HLD  Disposition: Home  Discharge Condition: Stable  Discharge Exam:  Lungs: CTAB Ext: trace edema b/l  Brief Hospital Course: [name] is a 48 y.o. female who presented with Chest pain and SOB. PMH is significant for Uncontrolled HTN, DM2, Obesity, Sleep apnea, and HLD. New HF on admission with BNP 1022; Cardiology was consulted and she was diuresed with IV Lasix. Her EKG showed new w/ inverted T waves V4-6, which was thought to be due to LVH as Troponins were negative x 3. An ECHO was performed that showed EF 55-60%. Pharmacologic nuclear scan was performed and showed Mild global LV hypokinesis with mildly depressed overall systolic function. EF 43%.  .  Acute diastolic Heart Failure: New onset HF. Work-up as above. Lasix 40mg  qd on discharge, but may need to change this to prn depending on responsive/fluid status. Medications as below.  HTN: Uncontrolled BP x 1 yr due to financial difficulties: BP was stable @ 118/55 on discharge. See below for HTN medications. Some complaints of lightheadedness, but no dizziness/presyncopal episodes.   DM: A1c 7.5. Restarted on Metformin at discharge; Consider increasing to 1000mg  BID  HLD: LDL 99 (07/2012); CV Risk 20%. Lipitor 40mg  was started   Issues for Follow Up:   Assess need for Lasix daily vs prn  Consider increasing metformin to 1000 mg BID  Address financial difficulties and effect on PCP follow/Medication adherance  Significant Procedures:   1. Pharmacologic nuclear scan  Significant Labs and Imaging:   Recent Labs Lab 11/17/13 0355 11/18/13 0253 11/19/13 0542  WBC 14.8* 14.6* 14.0*  HGB 11.6* 11.8* 12.5  HCT 38.3 38.2 41.4  PLT 219 285 245    Recent Labs Lab 11/16/13 1901 11/17/13 0220 11/18/13 0253 11/19/13 0542  NA 141 139 136* 138  K 4.1 3.9 3.5* 3.8  CL 100 97 93* 94*  CO2 30 30 30 31   GLUCOSE 184* 234* 170* 177*  BUN 7 9 13 15   CREATININE 0.66 0.71 0.76 0.72  CALCIUM 9.6 8.9 9.0 9.2   CXR 1/12  Cardiac silhouette moderately enlarged but stable. Pulmonary venous  hypertension with mild interstitial edema as evidenced by Charyl Dancer B  lines. No confluent airspace consolidation. Linear atelectasis in  the inferior left upper lobe. No visible pleural effusions.  Degenerative changes involving the thoracic spine.  IMPRESSION:  Stable moderate cardiomegaly. Mild diffuse interstitial pulmonary  edema consistent with mild CHF.  Results/Tests Pending at Time of Discharge: None  Discharge Medications:    Medication List    STOP taking these medications       ibuprofen 200 MG tablet  Commonly known as:  ADVIL,MOTRIN      TAKE these medications       acetaminophen 325 MG tablet  Commonly known as:  TYLENOL  Take 2 tablets (650 mg total) by mouth every 6 (six) hours as needed for mild pain (or Fever >/= 101).     aspirin 81 MG EC tablet  Take 1 tablet (81 mg total) by mouth daily.     atorvastatin 40 MG tablet  Commonly known  as:  LIPITOR  Take 1 tablet (40 mg total) by mouth daily at 6 PM.     carvedilol 6.25 MG tablet  Commonly known as:  COREG  Take 1 tablet (6.25 mg total) by mouth 2 (two) times daily with a meal.     furosemide 40 MG tablet  Commonly known as:  LASIX  Take 1 tablet (40 mg total) by mouth daily.     lisinopril 40 MG tablet  Commonly known as:  PRINIVIL,ZESTRIL  Take 1 tablet (40 mg total) by mouth daily.     metFORMIN 500 MG tablet  Commonly known as:  GLUCOPHAGE   Take 500 mg by mouth 2 (two) times daily with a meal.     nitroGLYCERIN 0.4 MG SL tablet  Commonly known as:  NITROSTAT  Place 1 tablet (0.4 mg total) under the tongue every 5 (five) minutes as needed for chest pain.     traMADol 50 MG tablet  Commonly known as:  ULTRAM  Take 50 mg by mouth every 6 (six) hours as needed for moderate pain.        Discharge Instructions: Please refer to Patient Instructions section of EMR for full details.  Patient was counseled important signs and symptoms that should prompt return to medical care, changes in medications, dietary instructions, activity restrictions, and follow up appointments.   Follow-Up Appointments: Follow-up Information   Follow up with Lora PaulaFUNCHES, JOSALYN C, MD On 11/23/2013. (Apt at 10am)    Specialty:  Family Medicine   Contact information:   [address] [place] [address] [phone]      Wenda LowJames Lyell Clugston, MD 11/19/2013, 10:58 AM PGY-1, [facility] Medicine

## 2013-11-18 NOTE — Progress Notes (Signed)
SUBJECTIVE:  Breathing much better.  No acute distress   PHYSICAL EXAM Filed Vitals:   11/17/13 1455 11/17/13 1629 11/17/13 2043 11/18/13 0608  BP: 131/61 153/69 157/75 148/67  Pulse: 76 86 84 70  Temp:  98.1 F (36.7 C) 98.1 F (36.7 C) 98.1 F (36.7 C)  TempSrc:  Oral Oral Oral  Resp:  20 20 18   Height:      Weight:    234 lb 4.8 oz (106.278 kg)  SpO2:  94% 96% 92%   General:  No distres Lungs:  Clear Heart:  RRR Abdomen:  Positive bowel sounds, no rebound no guarding Extremities:  No edema  LABS: Lab Results  Component Value Date   TROPONINI <0.30 11/17/2013   Results for orders placed during the hospital encounter of 11/16/13 (from the past 24 hour(s))  LIPID PANEL     Status: None   Collection Time    11/17/13  9:35 AM      Result Value Range   Cholesterol 169  0 - 200 mg/dL   Triglycerides 956  <387 mg/dL   HDL 48  >56 mg/dL   Total CHOL/HDL Ratio 3.5     VLDL 22  0 - 40 mg/dL   LDL Cholesterol 99  0 - 99 mg/dL  GLUCOSE, CAPILLARY     Status: Abnormal   Collection Time    11/17/13 10:59 AM      Result Value Range   Glucose-Capillary 171 (*) 70 - 99 mg/dL  TROPONIN I     Status: None   Collection Time    11/17/13  2:35 PM      Result Value Range   Troponin I <0.30  <0.30 ng/mL  GLUCOSE, CAPILLARY     Status: Abnormal   Collection Time    11/17/13  5:03 PM      Result Value Range   Glucose-Capillary 165 (*) 70 - 99 mg/dL  GLUCOSE, CAPILLARY     Status: Abnormal   Collection Time    11/17/13  9:23 PM      Result Value Range   Glucose-Capillary 215 (*) 70 - 99 mg/dL   Comment 1 Notify RN     Comment 2 Documented in Chart    BASIC METABOLIC PANEL     Status: Abnormal   Collection Time    11/18/13  2:53 AM      Result Value Range   Sodium 136 (*) 137 - 147 mEq/L   Potassium 3.5 (*) 3.7 - 5.3 mEq/L   Chloride 93 (*) 96 - 112 mEq/L   CO2 30  19 - 32 mEq/L   Glucose, Bld 170 (*) 70 - 99 mg/dL   BUN 13  6 - 23 mg/dL   Creatinine, Ser 4.33  0.50 -  1.10 mg/dL   Calcium 9.0  8.4 - 29.5 mg/dL   GFR calc non Af Amer >90  >90 mL/min   GFR calc Af Amer >90  >90 mL/min  CBC     Status: Abnormal   Collection Time    11/18/13  2:53 AM      Result Value Range   WBC 14.6 (*) 4.0 - 10.5 K/uL   RBC 5.01  3.87 - 5.11 MIL/uL   Hemoglobin 11.8 (*) 12.0 - 15.0 g/dL   HCT 18.8  41.6 - 60.6 %   MCV 76.2 (*) 78.0 - 100.0 fL   MCH 23.6 (*) 26.0 - 34.0 pg   MCHC 30.9  30.0 - 36.0 g/dL  RDW 18.8 (*) 11.5 - 15.5 %   Platelets 285  150 - 400 K/uL  GLUCOSE, CAPILLARY     Status: Abnormal   Collection Time    11/18/13  6:03 AM      Result Value Range   Glucose-Capillary 200 (*) 70 - 99 mg/dL   Comment 1 Notify RN     Comment 2 Documented in Chart      Intake/Output Summary (Last 24 hours) at 11/18/13 0852 Last data filed at 11/18/13 [id number]  Gross per 24 hour  Intake    720 ml  Output   3900 ml  Net  -3180 ml     ASSESSMENT AND PLAN:  ACUTE CONGESTIVE HEART FAILURE:  Echo is EF normal.    CHEST PRESSURE:  Enzymes negative x 2.  Doubt any acute coronary event.  Suspect symptoms related to HTN and volume.   She is having a Scientist, physiologicalLexiscan Myoview.  HTN:    BP is much improved.  Continue current therapy.   This might be the whole etiology (uncontrolled HTN).   Jamie FearingJames [facility] 11/18/2013 8:52 AM

## 2013-11-18 NOTE — Progress Notes (Signed)
Patient changed to inpatient r/t requiring IV lasix scheduled.

## 2013-11-18 NOTE — Progress Notes (Signed)
  Echocardiogram 2D Echocardiogram has been performed.  Cathie Beams 11/18/2013, 9:49 AM

## 2013-11-18 NOTE — Progress Notes (Signed)
Pt a/o, c/o headache, PRN tylenol given as ordered, pt oob ad lib, vss, pt stable

## 2013-11-18 NOTE — Care Management Note (Signed)
    Page 1 of 2   11/19/2013     1:33:55 PM   CARE MANAGEMENT NOTE 11/19/2013  Patient:  [name], [name]   Account Number:  [id number]  Date Initiated:  11/18/2013  Documentation initiated by:  Donato Schultz  Subjective/Objective Assessment:   11/16/2013 OBS for chest pain / nausea     Action/Plan:   CM will follow for disposition needs.   Anticipated DC Date:  11/19/2013   Anticipated DC Plan:  HOME/SELF CARE         Choice offered to / List presented to:             Status of service:  Completed, signed off Medicare Important Message given?   (If response is "NO", the following Medicare IM given date fields will be blank) Date Medicare IM given:   Date Additional Medicare IM given:    Discharge Disposition:  HOME/SELF CARE  Per UR Regulation:  Reviewed for med. necessity/level of care/duration of stay  If discussed at Long Length of Stay Meetings, dates discussed:    Comments:  11/19/2013 New CHF Insurance Corey Laski:  MCD potential  - patient states plans to reapply for MCD.  Last appliction submitted several years ago. Social:  From home with DTR.  Patient works at [employer] full time and still driving. MEDS:  Patient uses [employer] benefit $4.00 list meds and also gets discount on other meds not on the list d/t working at [employer]. Hx/o Home Sleep Apnea Machine for approximatley 6 years but stopped working about 3-4 months ago.  Patient verbalized understanding that being proactive at seeking MCD can provide benefit option for DME/Health needs. [facility] FAN/  Lurline Hare [phone] notified of status and CM requested update on status to facilitate d/c needs and medication assistance on 11/18/2013 CM encouraged compliance with medication mgmt, appts and HC coverage:  MCD. CM provided resource for Glucometer:  Brand Rely On which can be purchased at [facility] for about $15.00 ADD:  D/c planned for today. Disposition:  Home / Self care - Member is not home bound and does not  qualify for HHS. Crystal Hutchinson RN, BSN, MSHL, CCM 11/19/2012  11/18/2013 New CHF Current MGMT: IV Lasix [facility] FAN/  Lurline Hare [phone] notified of status and CM requested update on status to facilitate d/c needs and medication assistance ADD: +2-3 Disposition Plan:  Pending. Donato Schultz, RN, BSN, [place], [place] 11/18/2013

## 2013-11-18 NOTE — Discharge Instructions (Signed)
Heart Failure Heart failure means your heart has trouble pumping blood. This makes it hard for your body to work well. Heart failure is usually a long-term (chronic) condition. You must take good care of yourself and follow your doctor's treatment plan. HOME CARE  Take your heart medicine as told by your doctor.  Do not stop taking medicine unless your doctor tells you to.  Do not skip any dose of medicine.  Refill your medicines before they run out.  Take other medicines only as told by your doctor or pharmacist.  Stay active if told by your doctor. The elderly and people with severe heart failure should talk with a doctor about physical activity.  Eat heart healthy foods. Choose foods that are without trans fat and are low in saturated fat, cholesterol, and salt (sodium). This includes fresh or frozen fruits and vegetables, fish, lean meats, fat-free or low-fat dairy foods, whole grains, and high-fiber foods. Lentils and dried peas and beans (legumes) are also good choices.  Limit salt if told by your doctor.  Cook in a healthy way. Roast, grill, broil, bake, poach, steam, or stir-fry foods.  Limit fluids as told by your doctor.  Weigh yourself every morning. Do this after you pee (urinate) and before you eat breakfast. Write down your weight to give to your doctor.  Take your blood pressure and write it down if your doctor tell you to.  Ask your doctor how to check your pulse. Check your pulse as told.  Lose weight if told by your doctor.  Stop smoking or chewing tobacco. Do not use gum or patches that help you quit without your doctor's approval.  Schedule and go to doctor visits as told.  Nonpregnant women should have no more than 1 drink a day. Men should have no more than 2 drinks a day. Talk to your doctor about drinking alcohol.  Stop illegal drug use.  Stay current with shots (immunizations).  Manage your health conditions as told by your doctor.  Learn to manage  your stress.  Rest when you are tired.  If it is really hot outside:  Avoid intense activities.  Use air conditioning or fans, or get in a cooler place.  Avoid caffeine and alcohol.  Wear loose-fitting, lightweight, and light-colored clothing.  If it is really cold outside:  Avoid intense activities.  Layer your clothing.  Wear mittens or gloves, a hat, and a scarf when going outside.  Avoid alcohol.  Learn about heart failure and get support as needed.  Get help to maintain or improve your quality of life and your ability to care for yourself as needed. GET HELP IF:   You gain 03 lb/1.4 kg or more in 1 day or 05 lb/2.3 kg in a week.  You are more short of breath than usual.  You cannot do your normal activities.  You tire easily.  You cough more than normal, especially with activity.  You have any or more puffiness (swelling) in areas such as your hands, feet, ankles, or belly (abdomen).  You cannot sleep because it is hard to breathe.  You feel like your heart is beating fast (palpitations).  You get dizzy or lightheaded when you stand up. GET HELP RIGHT AWAY IF:   You have trouble breathing.  There is a change in mental status, such as becoming less alert or not being able to focus.  You have chest pain or discomfort.  You faint. MAKE SURE YOU:   Understand these   instructions.  Will watch your condition.  Will get help right away if you are not doing well or get worse. Document Released: 07/31/2008 Document Revised: 02/16/2013 Document Reviewed: 05/22/2012 ExitCare Patient Information 2014 [employer].  

## 2013-11-19 DIAGNOSIS — I10 Essential (primary) hypertension: Secondary | ICD-10-CM

## 2013-11-19 LAB — BASIC METABOLIC PANEL
BUN: 15 mg/dL (ref 6–23)
CALCIUM: 9.2 mg/dL (ref 8.4–10.5)
CO2: 31 mEq/L (ref 19–32)
Chloride: 94 mEq/L — ABNORMAL LOW (ref 96–112)
Creatinine, Ser: 0.72 mg/dL (ref 0.50–1.10)
GFR calc Af Amer: >90 mL/min (ref >90)
Glucose, Bld: 177 mg/dL — ABNORMAL HIGH (ref 70–99)
Potassium: 3.8 mEq/L (ref 3.7–5.3)
SODIUM: 138 meq/L (ref 137–147)

## 2013-11-19 LAB — CBC
HCT: 41.4 % (ref 36.0–46.0)
Hemoglobin: 12.5 g/dL (ref 12.0–15.0)
MCH: 23.7 pg — ABNORMAL LOW (ref 26.0–34.0)
MCHC: 30.2 g/dL (ref 30.0–36.0)
MCV: 78.6 fL (ref 78.0–100.0)
PLATELETS: 245 10*3/uL (ref 150–400)
RBC: 5.27 MIL/uL — ABNORMAL HIGH (ref 3.87–5.11)
RDW: 18.7 % — ABNORMAL HIGH (ref 11.5–15.5)
WBC: 14 10*3/uL — AB (ref 4.0–10.5)

## 2013-11-19 LAB — GLUCOSE, CAPILLARY
GLUCOSE-CAPILLARY: 176 mg/dL — AB (ref 70–99)
Glucose-Capillary: 197 mg/dL — ABNORMAL HIGH (ref 70–99)

## 2013-11-19 MED ORDER — ASPIRIN 81 MG PO TBEC: 81.0000 mg | DELAYED_RELEASE_TABLET | Freq: Every day | ORAL | Status: DC

## 2013-11-19 MED ORDER — FUROSEMIDE 40 MG PO TABS: 40.0000 mg | ORAL_TABLET | Freq: Every day | ORAL | Status: DC

## 2013-11-19 MED ORDER — ATORVASTATIN CALCIUM 40 MG PO TABS: 40.0000 mg | ORAL_TABLET | Freq: Every day | ORAL | Status: DC

## 2013-11-19 MED ORDER — ACETAMINOPHEN 325 MG PO TABS: 650.0000 mg | ORAL_TABLET | Freq: Four times a day (QID) | ORAL | Status: DC | PRN

## 2013-11-19 MED ORDER — CARVEDILOL 6.25 MG PO TABS: 6.2500 mg | ORAL_TABLET | Freq: Two times a day (BID) | ORAL | Status: DC

## 2013-11-19 MED ORDER — NITROGLYCERIN 0.4 MG SL SUBL: 0.4000 mg | SUBLINGUAL_TABLET | SUBLINGUAL | Status: DC | PRN

## 2013-11-19 MED ORDER — LISINOPRIL 40 MG PO TABS: 40.0000 mg | ORAL_TABLET | Freq: Every day | ORAL | Status: DC

## 2013-11-19 NOTE — Discharge Summary (Signed)
I agree with the discharge summary as documented.   Larry Knipp MD  

## 2013-11-19 NOTE — Progress Notes (Signed)
Discharge instructions given to patient, all questions answered, Work note given to patient, per MD. Discharged by volunteer services by wheelchair, NAD noted.

## 2013-11-19 NOTE — Clinical Documentation Improvement (Signed)
THIS DOCUMENT IS NOT A PERMANENT PART OF THE MEDICAL RECORD  Please update your documentation with the medical record to reflect your response to this query. If you need help knowing how to do this please call [phone].  11/19/13  Dr. Gayla Doss and/or Associates,  In a better effort to capture your patient's severity of illness, reflect appropriate length of stay and utilization of resources, a review of the patient medical record has revealed the following indicators:   Dr. Excell Seltzer consult note 01/13 2015 - "Acute congestive heart failure, suspect diastolic.  Malignant hypertension, uncontrolled."  Echo 01/14 2015 - EF 55-60%, Grade 2 diastolic dysfunction      Based on your clinical judgment, please document the ACUITY and TYPE of heart Failure monitored and treated this admission:  ACUITY:  - Acute  - Acute on chronic  - Chronic  And   TYPE:  - Systolic  - Diastolic  - Combined, Systolic and Diastolic     In responding to this query please exercise your independent judgment.    The fact that a query is asked, does not imply that any particular answer is desired or expected.   Reviewed: additional documentation in the medical record  Thank You,  Jerral Ralph  RN BSN CCDS Certified Clinical Documentation Specialist: [phone] Health Information Management [place]

## 2013-11-19 NOTE — Progress Notes (Signed)
FMTS Attending Note   I personally saw and evaluated the patient. The plan of care was discussed with the resident team. I agree with the assessment and plan as documented by the resident.   Patient without complaint, no chest pain, no shortness of breath   1. Congestive Heart Failure Exacerbation - Echo performed 1/14 showed EF 55-60% with grade II diastolic dysfunction, patient diuresing well  2. Chest pain with EKG changes- stress test low risk for ischemia however showed possible cardiomyopathy, appreciate cardiology input  3. HTN - controlled on Coreg and Lisinopril  4. DM/HLD stable  Disposition: Stable for discharge if no further workup from cardiology standpoint.   Donnella Sham MD

## 2013-11-19 NOTE — Progress Notes (Signed)
Family Medicine Teaching Service Daily Progress Note Intern Pager: [phone]  Patient name: [name] Medical record number: [id number] Date of birth: 08/29/1966 Age: 48 y.o. Gender: female  Primary Care Provider: Lora PaulaFUNCHES, JOSALYN C, MD Consultants: Cardiology  Code Status: No CPR, but ok to intubate   Pt Overview and Major Events to Date:  1/12: New HF; chest pain - ruleout  Assessment and Plan: [name] is a 48 y.o. female presenting with Chest pain and SOB. PMH is significant for Uncontrolled HTN, DM2, Obesity, Sleep apnea, and HLD. New HF on admission, and need for MI rule-out.   # HF: New onset w/ BNP 1022; Inc Dyspnea x 1039yrs, Orthopnea, Uncontrolled HTN, 1+ LE edema. ECHO 07/2012 showed EF55-60%  - CXR: Stable moderate cardiomegaly. Mild diffuse interstitial pulmonary edema consistent with mild CHF.  - elevated BNP which could point in that direction  - ECHO: EF 55-60% - Strict I&O: Net -6L & Wt Dec 18lbs from admit: Now 49231 - Consulting cardiology: New HF; EKG changes   Coreg 6.25 BID  Lasix 40 BID IV:   nuclear scan: Mild global LV hypokinesis with mildly depressed overall systolic function. EF 43%   # Chest Pain: Concern for ACS due to quality/location of pain and multiple risk factors (Uncontrolled HTN, DM, HLD, Hx of Cocaine use). DDx includes: Flu, PNA, GERD, Unlikely aortic dissection w/ normal mediastinum on CXR and equal BP in UE. EKG 1/12: NSR w/ Twave inversion in V6 & flattening in V5 consistent w/ previous EKG. Pain resolved w/ Morphine  - Monitor on tele; Repeat EKG (1/13) :NSR w/ inverted T waves V4-6 - Troponins Neg x 3; istat Trop Neg in ED  - UDS: Positive Opiates and THC - Rapid flu: Neg - ASA 81 qd   # HTN: Reports uncontrolled BP up to 200s/100s over the past year (No PCP visits due to financial difficulties)  - BP Last 24hrs: 118-211)/(55-93) 118/55 mmHg  - Lisinopril 40 qd   - Coreg and Lasix started as above  # DM  - Holding Metformin  -  A1c 7.5  - SSI TID w/ meals   # HLD  - LDL 99 (07/2012); CV Risk 20%  - Start Lipitor 40mg    FEN/GI:  Diet: Low salt; SLIV  Prophylaxis: Heparin   Disposition: Admit to tele; Attending Funches; Discharge home pending CP rule-out and initiation of HF managment  Subjective: No complaints; Denies CP, SOB.   Objective: Temp:  [97.5 F (36.4 C)-97.9 F (36.6 C)] 97.5 F (36.4 C) (01/15 0538) Pulse Rate:  [64-92] 67 (01/15 0900) Resp:  [20] 20 (01/15 0538) BP: (118-211)/(55-93) 118/55 mmHg (01/15 0900) SpO2:  [94 %-96 %] 94 % (01/15 0538) Weight:  [231 lb 14.4 oz (105.189 kg)] 231 lb 14.4 oz (105.189 kg) (01/15 0538) Physical Exam: General: Obese Female, NAD  HEENT: PERRL, MMM, TM clear  Cardiovascular: RRR no m/r/g; distant heart sounds due to body habitus; No JVD or HJR, no S3 appreciated  Respiratory: CTAB; normal respiratory effort  Abdomen: Soft, protuberant, NT/ND  Extremities: WWP, 2+ LE pitting edema to 1/3 shin  Laboratory:  Recent Labs Lab 11/17/13 0355 11/18/13 0253 11/19/13 0542  WBC 14.8* 14.6* 14.0*  HGB 11.6* 11.8* 12.5  HCT 38.3 38.2 41.4  PLT 219 285 245    Recent Labs Lab 11/17/13 0220 11/18/13 0253 11/19/13 0542  NA 139 136* 138  K 3.9 3.5* 3.8  CL 97 93* 94*  CO2 30 30 31   BUN 9  13 15  CREATININE 0.71 0.76 0.72  CALCIUM 8.9 9.0 9.2  GLUCOSE 234* 170* 177*   BNP    Component Value Date/Time   PROBNP 1022.0* 11/16/2013 1901    Recent Labs Lab 11/17/13 0220 11/17/13 0515 11/17/13 1435  TROPONINI <0.30 <0.30 <0.30   Imaging/Diagnostic Tests: CXR 1/12  Cardiac silhouette moderately enlarged but stable. Pulmonary venous  hypertension with mild interstitial edema as evidenced by Charyl Dancer B  lines. No confluent airspace consolidation. Linear atelectasis in  the inferior left upper lobe. No visible pleural effusions.  Degenerative changes involving the thoracic spine.  IMPRESSION:  Stable moderate cardiomegaly. Mild diffuse  interstitial pulmonary  edema consistent with mild CHF.  Myocar 1/13 Mild global LV hypokinesis with mildly depressed overall systolic  function. EF 43%  IMPRESSION:  Low risk nuclear perfusion study.  Findings suggest nonischemic cardiomyopathy, but "balanced ischemia"  due to multivessel or dominant left coronary disease cannot be  entirely excluded.   Wenda Low, MD 11/19/2013, 9:44 AM PGY-1, [facility] FPTS Intern pager: [phone], text pages welcome

## 2013-11-19 NOTE — Progress Notes (Signed)
SUBJECTIVE:  Breathing much better.  No acute distress.  Want to go home.   PHYSICAL EXAM Filed Vitals:   11/18/13 1500 11/18/13 2212 11/19/13 0538 11/19/13 0900  BP: 120/68 131/80 147/69 118/55  Pulse: 74 92 64 67  Temp: 97.9 F (36.6 C) 97.5 F (36.4 C) 97.5 F (36.4 C)   TempSrc: Oral Oral Oral   Resp: 20 20 20    Height:      Weight:   231 lb 14.4 oz (105.189 kg)   SpO2: 96% 94% 94%    General:  No distres Lungs:  Clear Heart:  RRR Abdomen:  Positive bowel sounds, no rebound no guarding Extremities:  No edema  LABS:  Results for orders placed during the hospital encounter of 11/16/13 (from the past 24 hour(s))  GLUCOSE, CAPILLARY     Status: Abnormal   Collection Time    11/18/13 12:22 PM      Result Value Range   Glucose-Capillary 170 (*) 70 - 99 mg/dL  GLUCOSE, CAPILLARY     Status: Abnormal   Collection Time    11/18/13  4:09 PM      Result Value Range   Glucose-Capillary 204 (*) 70 - 99 mg/dL   Comment 1 Notify RN    GLUCOSE, CAPILLARY     Status: Abnormal   Collection Time    11/18/13  9:05 PM      Result Value Range   Glucose-Capillary 153 (*) 70 - 99 mg/dL   Comment 1 Notify RN    BASIC METABOLIC PANEL     Status: Abnormal   Collection Time    11/19/13  5:42 AM      Result Value Range   Sodium 138  137 - 147 mEq/L   Potassium 3.8  3.7 - 5.3 mEq/L   Chloride 94 (*) 96 - 112 mEq/L   CO2 31  19 - 32 mEq/L   Glucose, Bld 177 (*) 70 - 99 mg/dL   BUN 15  6 - 23 mg/dL   Creatinine, Ser 1.610.72  0.50 - 1.10 mg/dL   Calcium 9.2  8.4 - 09.610.5 mg/dL   GFR calc non Af Amer >90  >90 mL/min   GFR calc Af Amer >90  >90 mL/min  CBC     Status: Abnormal   Collection Time    11/19/13  5:42 AM      Result Value Range   WBC 14.0 (*) 4.0 - 10.5 K/uL   RBC 5.27 (*) 3.87 - 5.11 MIL/uL   Hemoglobin 12.5  12.0 - 15.0 g/dL   HCT 04.541.4  40.936.0 - 81.146.0 %   MCV 78.6  78.0 - 100.0 fL   MCH 23.7 (*) 26.0 - 34.0 pg   MCHC 30.2  30.0 - 36.0 g/dL   RDW 91.418.7 (*) 78.211.5 - 95.615.5 %    Platelets 245  150 - 400 K/uL  GLUCOSE, CAPILLARY     Status: Abnormal   Collection Time    11/19/13  6:24 AM      Result Value Range   Glucose-Capillary 176 (*) 70 - 99 mg/dL   Comment 1 Notify RN      Intake/Output Summary (Last 24 hours) at 11/19/13 1002 Last data filed at 11/19/13 0936  Gross per 24 hour  Intake   1200 ml  Output   1050 ml  Net    150 ml     ASSESSMENT AND PLAN:  ACUTE CONGESTIVE HEART FAILURE:  Echo EF normal.  Seems to have been related to hypertensive urgency.  She can use Lasix PRN.  CHEST PRESSURE:  Enzymes negative x 2.  Doubt any acute coronary event.  Suspect symptoms related to HTN and volume.   No evidence of scar or ischemia.  EF suggested to be low.  However, this was not evident on the echo.  No further work up.  No evidence to suggest possibility of balanced ischemia.      HTN:    BP is much improved.  Continue current therapy.     OK to go home from my standpoint.    Fayrene Fearing [facility] 11/19/2013 10:02 AM

## 2013-11-20 NOTE — Discharge Summary (Signed)
I agree with the discharge summary as documented.   Bessy Reaney MD  

## 2013-11-23 ENCOUNTER — Ambulatory Visit (INDEPENDENT_AMBULATORY_CARE_PROVIDER_SITE_OTHER): Payer: BC Managed Care – PPO | Admitting: Family Medicine

## 2013-11-23 ENCOUNTER — Encounter: Payer: Self-pay | Admitting: Family Medicine

## 2013-11-23 VITALS — BP 160/68 | HR 71 | Temp 97.9°F | Ht 62.5 in | Wt 236.4 lb

## 2013-11-23 DIAGNOSIS — E119 Type 2 diabetes mellitus without complications: Secondary | ICD-10-CM

## 2013-11-23 DIAGNOSIS — I5032 Chronic diastolic (congestive) heart failure: Secondary | ICD-10-CM

## 2013-11-23 DIAGNOSIS — I10 Essential (primary) hypertension: Secondary | ICD-10-CM

## 2013-11-23 DIAGNOSIS — I509 Heart failure, unspecified: Secondary | ICD-10-CM

## 2013-11-23 MED ORDER — FUROSEMIDE 40 MG PO TABS: 40.0000 mg | ORAL_TABLET | Freq: Every day | ORAL | Status: DC

## 2013-11-23 NOTE — Progress Notes (Signed)
   Subjective:    Patient ID: [name], female    DOB: 08-05-1966, 48 y.o.   MRN: [id number]  HPI 48 yo F presents for f/u visit following hospitalization.  Hospitalized from 11/16/13-11/19/13 with CP and SOB on exertion. Found to have new onset diastolic HF in the setting of HTN. Discharged to home with lasix on med list. Has not taken lasix since discharge. Denies CP, HA, dizziness. Admits to shortness of breath with exertion. More mindful about salt intake. Still eating fast food.   Soc Hx: chronic non smoker  Review of Systems As per HPI     Objective:   Physical Exam BP 160/68  Pulse 71  Temp(Src) 97.9 F (36.6 C) (Oral)  Ht 5' 2.5" (1.588 m)  Wt 236 lb 6.4 oz (107.23 kg)  BMI 42.52 kg/m2  LMP 10/21/2013 Wt Readings from Last 3 Encounters:  11/23/13 236 lb 6.4 oz (107.23 kg)  11/19/13 231 lb 14.4 oz (105.189 kg)  10/07/13 300 lb (136.079 kg)   BP Readings from Last 3 Encounters:  11/23/13 160/68  11/19/13 118/55  10/07/13 172/83  General appearance: alert, cooperative, no distress and morbidly obese Lungs: clear to auscultation bilaterally Heart: regular rate and rhythm, S1, S2 normal, no murmur, click, rub or gallop Extremities: edema trace LE edema b/l        Assessment & Plan:

## 2013-11-23 NOTE — Assessment & Plan Note (Signed)
A: slight decline. With more increased SOB with exertion. P: Restart lasix 40 mg daily for next 5 days Then lasix 40 mg prn weight gain > 3 # Patient to keep calendar of weight and BP Patient to call if wt > 3 # up despite taking lasix once daily x 2 days  F/u with 4 weeks with calendar

## 2013-11-23 NOTE — Patient Instructions (Signed)
[name],  Thank you for coming in to see me today. Your dry weight after your hospitalization is 232 lbs.  Please buy scale. Take lasix daily for the next 5 days. Dry weight 232 lbs.  Once at goal. Weigh daily and write down weights on calendar.  If weight is up 3 lbs or more from the previous day take lasix once daily. If still up after taking lasix once daily for two days. Call me and we will discuss adjustments.   See me in 4 weeks.  Dr. Armen Pickup

## 2013-11-23 NOTE — Assessment & Plan Note (Signed)
A: elevated above goal P: Restart lasix Low salt diet Consider increasing BB as tolerated  Weight loss-lasix will help with some as patient does have peripheral edema.

## 2013-11-25 ENCOUNTER — Encounter: Payer: Self-pay | Admitting: Family Medicine

## 2013-12-01 ENCOUNTER — Telehealth: Payer: Self-pay | Admitting: Family Medicine

## 2013-12-01 NOTE — Telephone Encounter (Signed)
See above note. Pt herself called back approximately 10 minutes later and states her BP is coming down ( last was 153 / 80) and that she thinks her BP elevations as listed above were due to straining with vomiting. She thinks her lisinopril . Her BP at home has been 170's-190's systolic and her weight fluctuates around 232-237. She is feeling some better and questions whether she should come into the ED tonight or if she can wait to come into the clinic tomorrow.  Discussed extensively with pt; I advised her that coming into the hospital tonight to be checked out is a good idea because she had the vomiting, headache, and high blood pressure, but if she is feeling better and her BP is coming back down, that it is also reasonable to come into clinic first thing tomorrow morning. Strongly advised pt that if she continues to have vomiting and headache and / or if her blood pressure starts to go back up tonight, especially if she has chest pain, SOB, or paralysis / change in sensation / etc, that she should come into the ED tonight regardless of her blood pressure. Pt voiced understanding and expressed her gratitude for this and the first page to which I responded. Will forward to PCP.  Bobbye Morton, MD PGY-2, [facility] 12/01/2013, 10:35 PM

## 2013-12-01 NOTE — Telephone Encounter (Addendum)
Received call on the emergency line from pt's daughter. Pt was recently admitted for chest pain and found to have new-onset heart failure in the setting of HTN. She was discharged 1/15 and was seen by her PCP on 1/19, and has had headaches on and off since that time. Around 9:30, tonight, pt vomited and felt "very bad," "very worried." Daughter reports BP was 230 / 100 (electronic cuff) and rechecked it at 203/108, 212/112. She takes Coreg, Lasix, and lisinopril and has not missed any doses. While on the phone with me, pt's BP was rechecked in the right arm 211 / 110 and in the left arm 211 / 104. Pt has no other symptoms right now other than headaches and "weakness in her legs" (feeling tired, not hemiparetic).  Advised pt's daughter that she should come into the ED to be checked out for her high blood pressure, especially given recent admission and severity of HTN with headache and vomiting. Explained to pt's daughter that if she requires admission, I will be one of the doctors that will evaluate / treat her, tonight. Pt's daughter voiced understanding and they will proceed to the ED, tonight. Will route this note to pt's PCP Dr. Armen Pickup.  Bobbye Morton, MD PGY-2, [facility] 12/01/2013, 10:19 PM

## 2013-12-02 ENCOUNTER — Ambulatory Visit (INDEPENDENT_AMBULATORY_CARE_PROVIDER_SITE_OTHER): Payer: BC Managed Care – PPO | Admitting: Family Medicine

## 2013-12-02 ENCOUNTER — Telehealth: Payer: Self-pay | Admitting: Family Medicine

## 2013-12-02 ENCOUNTER — Encounter: Payer: Self-pay | Admitting: Family Medicine

## 2013-12-02 VITALS — BP 147/74 | HR 68 | Temp 98.6°F | Ht 62.5 in | Wt 238.0 lb

## 2013-12-02 DIAGNOSIS — K219 Gastro-esophageal reflux disease without esophagitis: Secondary | ICD-10-CM

## 2013-12-02 DIAGNOSIS — I10 Essential (primary) hypertension: Secondary | ICD-10-CM

## 2013-12-02 MED ORDER — RANITIDINE HCL 150 MG PO TABS: 150.0000 mg | ORAL_TABLET | Freq: Two times a day (BID) | ORAL | Status: DC

## 2013-12-02 MED ORDER — HYDROCHLOROTHIAZIDE 12.5 MG PO CAPS: 12.5000 mg | ORAL_CAPSULE | Freq: Every day | ORAL | Status: DC

## 2013-12-02 NOTE — Telephone Encounter (Signed)
Pt is returning call and now at the number listed. jw

## 2013-12-02 NOTE — Telephone Encounter (Signed)
Attempted to call patient back. No answer unable to leave VM. If patient calls back please let her know that I called to f/u BP and her HA and to make sure she schedules f/u. Preferably today if still symptomatic.  Please assist patient in scheduling f/u.

## 2013-12-02 NOTE — Patient Instructions (Signed)
I am wondering if your upper chest discomfort and nausea are due to reflux especially as these are after meals. Let's try Zantac (you can get this through work as we discussed) to see if this helps you.   Your blood pressure looks decent today but could still come down some. Keep up the low salt diet. We are going to add hydrochlorothiazide at a low dose as well (may pee some more).   See [place] in 2 weeks (Dr. Armen Pickup or myself for a recheck) Dr. Durene Cal

## 2013-12-02 NOTE — Telephone Encounter (Signed)
Attempted to f/u with patient and her daughter. Unable to reach anyone at both #s Unable to leave a VM.

## 2013-12-03 DIAGNOSIS — K219 Gastro-esophageal reflux disease without esophagitis: Secondary | ICD-10-CM | POA: Insufficient documentation

## 2013-12-03 NOTE — Assessment & Plan Note (Signed)
Continues to be mildly elevated. Acute spike last night likely due to stress from throwing up. Patient already on max dose lisinopril, only mild edema and wt essentially stable so do not want to increase lasix. HR 68 so do nto want to increase coreg. At this time, will add 12.5 mg of HCTZ and have patient follow up with Dr. Armen Pickup in about 2 weeks for repeat

## 2013-12-03 NOTE — Assessment & Plan Note (Signed)
Patient admitted for chest pain and does not appear to have clear diagnosis after leaving hospital. Ruled out for ACS and low risk lexiscan myoview. Given nausea and upper chest discomfort worse with eating and when lying down after meals, suspect GERD may be a cause. Patient wants to trial zantac which she can get for low cost at work. i have sent this in to [employer] [employer] for her. I think her elevated blood pressure was due to reaction to vomiting last night.

## 2013-12-03 NOTE — Progress Notes (Signed)
Jamie ConchStephen [name], MD Phone: [phone]  Subjective:  Chief complaint-noted   Patient presents for follow up of blood pressure. She states that she often will have some upper chest discomfort after meals which is associated with nausea. She feels a fullness in her upper chest.  Usually it gets better with tums or a glass of water. Seems to be worse with large meals. Has been going on for at least a month but may be longer. Stable except for last night.   It often flares back up when she lays down. Not associated with meals. In early January she was admitted to the hospital with chest pain (which was different from this) and hasd a stress test which was low risk for ischemia per Dr. Versie StarksFletke's note on 11/19/13. Patient states her nausea ended up causing her to throw up last night and checked her blood pressure which was over 200. She called and was advised to go to Ed but waited until today to present for evaluation. She has had no further nausea/vomiting.  ROS-no fever/chills. No shortness of breath. Only upper chest pain as described above.   Past Medical History Patient Active Problem List   Diagnosis Date Noted  . GERD (gastroesophageal reflux disease) 12/03/2013  . Chronic diastolic CHF (congestive heart failure) 11/16/2013  . Degenerative disk disease 08/12/2012  . Hyperlipidemia LDL goal < 100 08/11/2012  . Hypertension 07/26/2012  . Diabetes mellitus, type 2 07/26/2012  . Obesity 07/26/2012    Medications- reviewed and updated Current Outpatient Prescriptions  Medication Sig Dispense Refill  . acetaminophen (TYLENOL) 325 MG tablet Take 2 tablets (650 mg total) by mouth every 6 (six) hours as needed for mild pain (or Fever >/= 101).  60 tablet  0  . aspirin EC 81 MG EC tablet Take 1 tablet (81 mg total) by mouth daily.  30 tablet  0  . atorvastatin (LIPITOR) 40 MG tablet Take 1 tablet (40 mg total) by mouth daily at 6 PM.  30 tablet  0  . carvedilol (COREG) 6.25 MG tablet Take 1  tablet (6.25 mg total) by mouth 2 (two) times daily with a meal.  60 tablet  0  . furosemide (LASIX) 40 MG tablet Take 1 tablet (40 mg total) by mouth daily.  30 tablet  0  . hydrochlorothiazide (MICROZIDE) 12.5 MG capsule Take 1 capsule (12.5 mg total) by mouth daily.  30 capsule  5  . lisinopril (PRINIVIL,ZESTRIL) 40 MG tablet Take 1 tablet (40 mg total) by mouth daily.  30 tablet  0  . metFORMIN (GLUCOPHAGE) 500 MG tablet Take 500 mg by mouth 2 (two) times daily with a meal.       . nitroGLYCERIN (NITROSTAT) 0.4 MG SL tablet Place 1 tablet (0.4 mg total) under the tongue every 5 (five) minutes as needed for chest pain.  10 tablet  0  . ranitidine (ZANTAC) 150 MG tablet Take 1 tablet (150 mg total) by mouth 2 (two) times daily.  60 tablet  5   No current facility-administered medications for this visit.    Objective: BP 147/74  Pulse 68  Temp(Src) 98.6 F (37 C) (Oral)  Ht 5' 2.5" (1.588 m)  Wt 238 lb (107.956 kg)  BMI 42.81 kg/m2  LMP 11/21/2013 Gen: NAD, resting comfortably on table, morbidly obese CV: RRR no murmurs rubs or gallops Lungs: CTAB no crackles, wheeze, rhonchi Abdomen: soft/nontender/nondistended/normal bowel sounds. No rebound or guarding.  Ext: trace edema Skin: warm, dry Neuro: grossly normal, moves all extremities  Assessment/Plan:  GERD (gastroesophageal reflux disease) Patient admitted for chest pain and does not appear to have clear diagnosis after leaving hospital. Ruled out for ACS and low risk lexiscan myoview. Given nausea and upper chest discomfort worse with eating and when lying down after meals, suspect GERD may be a cause. Patient wants to trial zantac which she can get for low cost at work. i have sent this in to [facility] for her. I think her elevated blood pressure was due to reaction to vomiting last night.   Hypertension Continues to be mildly elevated. Acute spike last night likely due to stress from throwing up. Patient already on max dose  lisinopril, only mild edema and wt essentially stable so do not want to increase lasix. HR 68 so do nto want to increase coreg. At this time, will add 12.5 mg of HCTZ and have patient follow up with Dr. Armen Pickup in about 2 weeks for repeat   Meds ordered this encounter  Medications  . ranitidine (ZANTAC) 150 MG tablet    Sig: Take 1 tablet (150 mg total) by mouth 2 (two) times daily.    Dispense:  60 tablet    Refill:  5  . hydrochlorothiazide (MICROZIDE) 12.5 MG capsule    Sig: Take 1 capsule (12.5 mg total) by mouth daily.    Dispense:  30 capsule    Refill:  5

## 2013-12-18 ENCOUNTER — Ambulatory Visit (INDEPENDENT_AMBULATORY_CARE_PROVIDER_SITE_OTHER): Payer: BC Managed Care – PPO | Admitting: Family Medicine

## 2013-12-18 ENCOUNTER — Encounter: Payer: Self-pay | Admitting: Family Medicine

## 2013-12-18 VITALS — BP 136/80 | HR 69 | Temp 98.1°F | Ht 62.5 in | Wt 237.5 lb

## 2013-12-18 DIAGNOSIS — K219 Gastro-esophageal reflux disease without esophagitis: Secondary | ICD-10-CM

## 2013-12-18 DIAGNOSIS — G5711 Meralgia paresthetica, right lower limb: Secondary | ICD-10-CM

## 2013-12-18 DIAGNOSIS — I10 Essential (primary) hypertension: Secondary | ICD-10-CM

## 2013-12-18 DIAGNOSIS — G5710 Meralgia paresthetica, unspecified lower limb: Secondary | ICD-10-CM

## 2013-12-18 DIAGNOSIS — I509 Heart failure, unspecified: Secondary | ICD-10-CM

## 2013-12-18 DIAGNOSIS — Z23 Encounter for immunization: Secondary | ICD-10-CM

## 2013-12-18 DIAGNOSIS — I5032 Chronic diastolic (congestive) heart failure: Secondary | ICD-10-CM

## 2013-12-18 HISTORY — DX: Meralgia paresthetica, right lower limb: G57.11

## 2013-12-18 LAB — FERRITIN: FERRITIN: 79 ng/mL (ref 10–291)

## 2013-12-18 LAB — BASIC METABOLIC PANEL
BUN: 23 mg/dL (ref 6–23)
CO2: 28 mEq/L (ref 19–32)
Calcium: 9.5 mg/dL (ref 8.4–10.5)
Chloride: 98 mEq/L (ref 96–112)
Creat: 0.96 mg/dL (ref 0.50–1.10)
GLUCOSE: 184 mg/dL — AB (ref 70–99)
Potassium: 3.9 mEq/L (ref 3.5–5.3)
Sodium: 138 mEq/L (ref 135–145)

## 2013-12-18 LAB — IRON AND TIBC
%SAT: 16 % — ABNORMAL LOW (ref 20–55)
Iron: 58 ug/dL (ref 42–145)
TIBC: 368 ug/dL (ref 250–470)
UIBC: 310 ug/dL (ref 125–400)

## 2013-12-18 MED ORDER — ATORVASTATIN CALCIUM 40 MG PO TABS: 40.0000 mg | ORAL_TABLET | Freq: Every day | ORAL | Status: DC

## 2013-12-18 MED ORDER — FUROSEMIDE 40 MG PO TABS: 40.0000 mg | ORAL_TABLET | Freq: Every day | ORAL | Status: DC

## 2013-12-18 MED ORDER — PREGABALIN 50 MG PO CAPS: 50.0000 mg | ORAL_CAPSULE | Freq: Three times a day (TID) | ORAL | Status: DC

## 2013-12-18 MED ORDER — LISINOPRIL 40 MG PO TABS: 40.0000 mg | ORAL_TABLET | Freq: Every day | ORAL | Status: DC

## 2013-12-18 MED ORDER — CARVEDILOL 6.25 MG PO TABS: 6.2500 mg | ORAL_TABLET | Freq: Two times a day (BID) | ORAL | Status: DC

## 2013-12-18 MED ORDER — METFORMIN HCL 500 MG PO TABS: 500.0000 mg | ORAL_TABLET | Freq: Two times a day (BID) | ORAL | Status: DC

## 2013-12-18 NOTE — Assessment & Plan Note (Signed)
A: compensated. Meds: compliant P: Encouraged weight loss Iron studies obtained

## 2013-12-18 NOTE — Patient Instructions (Signed)
[name],  Thank you for coming in today.  For health maintenance: flu shot and TDAP today. Schedule f/u for pap smear at your convenience.  For HTN: BP is doing well. Continue regimen BMP today. When you f/u bring your home cuff for a comparison.  For R thigh pain: burning. This is a neuropathy. We will try lyrica.  Core strengthening for back and lower abdominal pain is also recommended   F/u in 4-6 weeks for pap and HTN f/u.   Dr. Armen Pickup

## 2013-12-18 NOTE — Progress Notes (Signed)
   Subjective:    Patient ID: [name], female    DOB: 08/19/1966, 48 y.o.   MRN: [id number]  HPI 48 yo F presents for f/u vist:   1. HTN: compliant with medication. Denies CP, SOB, HA, vision changes edema. Monitoring BP at home with wrist cuff  150s/60s-70s. Monitoring weights 232-237#. compliant with low salt diet. Not exercising.   2. Diastolic CHF: same as 1.   3. R thigh pain: sharp pains. Worse with standing. Relieved with sitting. No rash or lesions. No injury. Hxo f well controlled DM2.   4. Abdominal pain: improved. No reflux. Some feeling bloat/delayed gastric emptying. Has lower back and lower abdominal pains that are worse with prolonged standing and improved with squatting. No vaginal bleeding, blood in stool, fever, nausea, vomiting or weight loss. Has a BM daily.   Soc Hx: non smoker  Review of Systems As per HPI     Objective:   Physical Exam BP 136/80  Pulse 69  Temp(Src) 98.1 F (36.7 C) (Oral)  Ht 5' 2.5" (1.588 m)  Wt 237 lb 8 oz (107.729 kg)  BMI 42.72 kg/m2  LMP 12/18/2013 General appearance: alert, cooperative, no distress and moderately obese Back: symmetric, no curvature. ROM normal. No CVA tenderness. negative straight leg.  Lungs: clear to auscultation bilaterally Heart: regular rate and rhythm, S1, S2 normal, no murmur, click, rub or gallop Abdomen: obese, round, soft, NT. ND, NABS Extremities: edema trace b/l     Assessment & Plan:

## 2013-12-18 NOTE — Assessment & Plan Note (Signed)
A: improved. P: continue H2 blocker prn

## 2013-12-18 NOTE — Assessment & Plan Note (Signed)
A: exam consistent with MP.  P: Core exercises lyrica trial

## 2013-12-18 NOTE — Assessment & Plan Note (Signed)
A: well controlled.  Meds: compliant P:  Continue current regimen Check BMP

## 2013-12-23 ENCOUNTER — Encounter: Payer: Self-pay | Admitting: Family Medicine

## 2014-01-15 ENCOUNTER — Ambulatory Visit: Payer: BC Managed Care – PPO | Admitting: Family Medicine

## 2014-01-25 ENCOUNTER — Ambulatory Visit: Payer: BC Managed Care – PPO | Admitting: Family Medicine

## 2014-04-27 ENCOUNTER — Encounter: Payer: Self-pay | Admitting: Family Medicine

## 2014-04-27 ENCOUNTER — Ambulatory Visit (INDEPENDENT_AMBULATORY_CARE_PROVIDER_SITE_OTHER): Payer: BC Managed Care – PPO | Admitting: Family Medicine

## 2014-04-27 VITALS — BP 93/56 | HR 86 | Temp 100.2°F | Ht 62.5 in | Wt 240.0 lb

## 2014-04-27 DIAGNOSIS — I951 Orthostatic hypotension: Secondary | ICD-10-CM

## 2014-04-27 DIAGNOSIS — E119 Type 2 diabetes mellitus without complications: Secondary | ICD-10-CM

## 2014-04-27 HISTORY — DX: Orthostatic hypotension: I95.1

## 2014-04-27 LAB — POCT GLYCOSYLATED HEMOGLOBIN (HGB A1C): Hemoglobin A1C: 7.6

## 2014-04-27 NOTE — Patient Instructions (Signed)
Ms. Ibsen,  Thank you for coming in today. You are orthostatic (low volume due to you medication and the heat).  DO NOT take: HCTZ or lasix, Do take coreg and lisinopril.  Drink plenty of water/gatorade.  Stay out of the heat. I will call with lab results. Out of work until 05/01/14.   Dr. Armen Pickup

## 2014-04-27 NOTE — Progress Notes (Signed)
   Subjective:    Patient ID: [name], female    DOB: Apr 11, 1966, 48 y.o.   MRN: [id number] CC: not feeling well  HPI 48 yo F with DM2 presents for sick visit:  1. Not feeling well: 4 days ago patient developed worsening of R sided hip pain that radiated up to her back. Additionally she developed chills, HA, nausea and dizziness. She described an episode of her lips, face and hands turning blue following a shower. She did not let her daughter call EMS. She felt better when she laid down. She missed work yesterday and today. She still feels dizzy upon standing. She denies fever, CP, SOB, vomiting, dysuria.   She has been complaint with all medications including lasix and HCTZ. Last took them both this AM. She has not required NTG.   Soc hx: smoker  Review of Systems As per HPI     Objective:   Physical Exam BP 114/49  Pulse 88  Temp(Src) 100.2 F (37.9 C) (Oral)  Ht 5' 2.5" (1.588 m)  Wt 240 lb (108.863 kg)  BMI 43.17 kg/m2  SpO2 98% Supine BP 105/71 HR 77 Sitting BP 81/53  HR 83 Standing BP 93/56 HR 86  General appearance: alert, cooperative and no distress Lungs: clear to auscultation bilaterally Heart: regular rate and rhythm, S1, S2 normal, no murmur, click, rub or gallop Ext: no edema   Lab Results  Component Value Date   HGBA1C 7.6 04/27/2014       Assessment & Plan:

## 2014-04-27 NOTE — Assessment & Plan Note (Signed)
A: You are orthostatic (low volume due to you medication and the heat).  P:  DO NOT take: HCTZ or lasix, Do take coreg and lisinopril.  Drink plenty of water/gatorade.  Stay out of the heat. I will call with lab results CBC and CMP  Out of work until 05/01/14

## 2014-04-28 ENCOUNTER — Telehealth: Payer: Self-pay | Admitting: Family Medicine

## 2014-04-28 NOTE — Telephone Encounter (Signed)
Please inform patient. Do not give any medical information to her job unless she has given consent.   Patient should remain out of work until 05/01/14 as instructed. If patient desires to go back sooner she will need to come in for BP recheck first as she was significantly orthostatic and symptomatic. Marland Kitchen

## 2014-04-28 NOTE — Telephone Encounter (Signed)
Pt job has called and asked if she could do sit down work starting on Friday. Dr Armen Pickup said she could go back to work on Monday unless she felt better on Frida Please advise

## 2014-04-28 NOTE — Telephone Encounter (Signed)
LM for patient to call back.  Please give message from MD. [name],CMA  

## 2014-04-29 ENCOUNTER — Ambulatory Visit (INDEPENDENT_AMBULATORY_CARE_PROVIDER_SITE_OTHER): Payer: BC Managed Care – PPO | Admitting: *Deleted

## 2014-04-29 VITALS — BP 129/63 | HR 78 | Temp 98.9°F

## 2014-04-29 DIAGNOSIS — Z136 Encounter for screening for cardiovascular disorders: Secondary | ICD-10-CM

## 2014-04-29 DIAGNOSIS — Z0130 Encounter for examination of blood pressure without abnormal findings: Secondary | ICD-10-CM

## 2014-04-29 NOTE — Progress Notes (Signed)
   Pt in nurse clinic for blood pressure check.  Orthostatic blood pressure completed.  Sitting 129/63, hr 78; lying 140/72, hr 82; standing 149/72, hr 86 and standing 146/78, hr 86.  Pt denies any other symptoms such as visual changes, dizziness, SOB or chest pain.  Per pt she took blood pressure medication as prescribed.  Pt stated she is ready to return to work.  Will forward to PCP.  Clovis Pu, RN

## 2014-05-03 ENCOUNTER — Encounter: Payer: Self-pay | Admitting: Family Medicine

## 2014-06-17 ENCOUNTER — Other Ambulatory Visit: Payer: Self-pay | Admitting: Family Medicine

## 2015-07-28 ENCOUNTER — Other Ambulatory Visit: Payer: Self-pay | Admitting: Family Medicine

## 2015-07-28 NOTE — Telephone Encounter (Signed)
RN staff - please call to schedule follow up with PCP

## 2015-07-29 NOTE — Telephone Encounter (Signed)
Tied calling patient, number no longer in service. Will send letter.

## 2015-12-15 ENCOUNTER — Telehealth: Payer: Self-pay | Admitting: *Deleted

## 2015-12-15 NOTE — Telephone Encounter (Signed)
Received faxed request from [employer] for Rx for zostavax ([id number]) Izzac Rockett, Nickola Major, RN

## 2015-12-16 NOTE — Telephone Encounter (Signed)
RN staff - this patient has not been seen in office since 04/2014. Please schedule follow up visit. I am happy to write for a zostavax prescription at that time.

## 2015-12-22 NOTE — Telephone Encounter (Signed)
Tried calling, number no longer in service.

## 2016-02-04 DIAGNOSIS — I252 Old myocardial infarction: Secondary | ICD-10-CM

## 2016-02-04 DIAGNOSIS — I219 Acute myocardial infarction, unspecified: Secondary | ICD-10-CM

## 2016-02-04 HISTORY — DX: Acute myocardial infarction, unspecified: I21.9

## 2016-02-04 HISTORY — DX: Old myocardial infarction: I25.2

## 2016-02-10 ENCOUNTER — Inpatient Hospital Stay (HOSPITAL_COMMUNITY)
Admission: EM | Admit: 2016-02-10 | Discharge: 2016-02-17 | DRG: 246 | Disposition: A | Discharge status: Home / Self Care | Payer: BLUE CROSS/BLUE SHIELD | Attending: Internal Medicine | Admitting: Internal Medicine

## 2016-02-10 ENCOUNTER — Encounter (HOSPITAL_COMMUNITY): Payer: Self-pay

## 2016-02-10 ENCOUNTER — Emergency Department (HOSPITAL_COMMUNITY): Payer: BLUE CROSS/BLUE SHIELD

## 2016-02-10 DIAGNOSIS — Z79899 Other long term (current) drug therapy: Secondary | ICD-10-CM

## 2016-02-10 DIAGNOSIS — Z7982 Long term (current) use of aspirin: Secondary | ICD-10-CM

## 2016-02-10 DIAGNOSIS — Z6841 Body Mass Index (BMI) 40.0 and over, adult: Secondary | ICD-10-CM

## 2016-02-10 DIAGNOSIS — Z9119 Patient's noncompliance with other medical treatment and regimen: Secondary | ICD-10-CM | POA: Diagnosis not present

## 2016-02-10 DIAGNOSIS — J811 Chronic pulmonary edema: Secondary | ICD-10-CM

## 2016-02-10 DIAGNOSIS — Z9111 Patient's noncompliance with dietary regimen: Secondary | ICD-10-CM

## 2016-02-10 DIAGNOSIS — Z9889 Other specified postprocedural states: Secondary | ICD-10-CM

## 2016-02-10 DIAGNOSIS — R059 Cough, unspecified: Secondary | ICD-10-CM

## 2016-02-10 DIAGNOSIS — I214 Non-ST elevation (NSTEMI) myocardial infarction: Secondary | ICD-10-CM | POA: Diagnosis present

## 2016-02-10 DIAGNOSIS — N179 Acute kidney failure, unspecified: Secondary | ICD-10-CM | POA: Diagnosis present

## 2016-02-10 DIAGNOSIS — I447 Left bundle-branch block, unspecified: Secondary | ICD-10-CM | POA: Diagnosis not present

## 2016-02-10 DIAGNOSIS — J9600 Acute respiratory failure, unspecified whether with hypoxia or hypercapnia: Secondary | ICD-10-CM

## 2016-02-10 DIAGNOSIS — I161 Hypertensive emergency: Secondary | ICD-10-CM | POA: Diagnosis present

## 2016-02-10 DIAGNOSIS — I5033 Acute on chronic diastolic (congestive) heart failure: Secondary | ICD-10-CM | POA: Diagnosis present

## 2016-02-10 DIAGNOSIS — I130 Hypertensive heart and chronic kidney disease with heart failure and stage 1 through stage 4 chronic kidney disease, or unspecified chronic kidney disease: Principal | ICD-10-CM | POA: Diagnosis present

## 2016-02-10 DIAGNOSIS — Z4659 Encounter for fitting and adjustment of other gastrointestinal appliance and device: Secondary | ICD-10-CM | POA: Diagnosis not present

## 2016-02-10 DIAGNOSIS — G4733 Obstructive sleep apnea (adult) (pediatric): Secondary | ICD-10-CM | POA: Diagnosis present

## 2016-02-10 DIAGNOSIS — R061 Stridor: Secondary | ICD-10-CM | POA: Diagnosis not present

## 2016-02-10 DIAGNOSIS — E1165 Type 2 diabetes mellitus with hyperglycemia: Secondary | ICD-10-CM

## 2016-02-10 DIAGNOSIS — I2510 Atherosclerotic heart disease of native coronary artery without angina pectoris: Secondary | ICD-10-CM

## 2016-02-10 DIAGNOSIS — E876 Hypokalemia: Secondary | ICD-10-CM | POA: Diagnosis present

## 2016-02-10 DIAGNOSIS — I110 Hypertensive heart disease with heart failure: Secondary | ICD-10-CM | POA: Insufficient documentation

## 2016-02-10 DIAGNOSIS — J9602 Acute respiratory failure with hypercapnia: Secondary | ICD-10-CM | POA: Diagnosis present

## 2016-02-10 DIAGNOSIS — R0602 Shortness of breath: Secondary | ICD-10-CM | POA: Diagnosis present

## 2016-02-10 DIAGNOSIS — Z9114 Patient's other noncompliance with medication regimen: Secondary | ICD-10-CM | POA: Diagnosis not present

## 2016-02-10 DIAGNOSIS — J9601 Acute respiratory failure with hypoxia: Secondary | ICD-10-CM | POA: Diagnosis present

## 2016-02-10 DIAGNOSIS — E1122 Type 2 diabetes mellitus with diabetic chronic kidney disease: Secondary | ICD-10-CM | POA: Diagnosis present

## 2016-02-10 DIAGNOSIS — N189 Chronic kidney disease, unspecified: Secondary | ICD-10-CM | POA: Diagnosis present

## 2016-02-10 DIAGNOSIS — Z794 Long term (current) use of insulin: Secondary | ICD-10-CM | POA: Diagnosis not present

## 2016-02-10 DIAGNOSIS — I059 Rheumatic mitral valve disease, unspecified: Secondary | ICD-10-CM | POA: Diagnosis not present

## 2016-02-10 DIAGNOSIS — J810 Acute pulmonary edema: Secondary | ICD-10-CM | POA: Diagnosis present

## 2016-02-10 DIAGNOSIS — R05 Cough: Secondary | ICD-10-CM

## 2016-02-10 DIAGNOSIS — R079 Chest pain, unspecified: Secondary | ICD-10-CM | POA: Diagnosis not present

## 2016-02-10 DIAGNOSIS — J189 Pneumonia, unspecified organism: Secondary | ICD-10-CM | POA: Diagnosis present

## 2016-02-10 HISTORY — DX: Acute pulmonary edema: J81.0

## 2016-02-10 HISTORY — DX: Acute respiratory failure, unspecified whether with hypoxia or hypercapnia: J96.00

## 2016-02-10 HISTORY — DX: Other specified postprocedural states: Z98.890

## 2016-02-10 HISTORY — DX: Atherosclerotic heart disease of native coronary artery without angina pectoris: I25.10

## 2016-02-10 LAB — BASIC METABOLIC PANEL
Anion gap: 19 — ABNORMAL HIGH (ref 5–15)
BUN: 9 mg/dL (ref 6–20)
CHLORIDE: 100 mmol/L — AB (ref 101–111)
CO2: 19 mmol/L — ABNORMAL LOW (ref 22–32)
Calcium: 9.2 mg/dL (ref 8.9–10.3)
Creatinine, Ser: 1.28 mg/dL — ABNORMAL HIGH (ref 0.44–1.00)
GFR calc non Af Amer: 49 mL/min — ABNORMAL LOW (ref >60)
GFR, EST AFRICAN AMERICAN: 56 mL/min — AB (ref >60)
Glucose, Bld: 410 mg/dL — ABNORMAL HIGH (ref 65–99)
POTASSIUM: 3.6 mmol/L (ref 3.5–5.1)
SODIUM: 138 mmol/L (ref 135–145)

## 2016-02-10 LAB — I-STAT CHEM 8, ED
BUN: 10 mg/dL (ref 6–20)
CHLORIDE: 101 mmol/L (ref 101–111)
CREATININE: 1.1 mg/dL — AB (ref 0.44–1.00)
Calcium, Ion: 1.12 mmol/L (ref 1.12–1.23)
Glucose, Bld: 406 mg/dL — ABNORMAL HIGH (ref 65–99)
HEMATOCRIT: 53 % — AB (ref 36.0–46.0)
HEMOGLOBIN: 18 g/dL — AB (ref 12.0–15.0)
POTASSIUM: 3.5 mmol/L (ref 3.5–5.1)
Sodium: 139 mmol/L (ref 135–145)
TCO2: 22 mmol/L (ref 0–100)

## 2016-02-10 LAB — URINALYSIS, ROUTINE W REFLEX MICROSCOPIC
Bilirubin Urine: NEGATIVE
Ketones, ur: NEGATIVE mg/dL
LEUKOCYTES UA: NEGATIVE
Nitrite: NEGATIVE
PH: 6.5 (ref 5.0–8.0)
SPECIFIC GRAVITY, URINE: 1.023 (ref 1.005–1.030)

## 2016-02-10 LAB — CBC WITH DIFFERENTIAL/PLATELET
Basophils Absolute: 0.1 10*3/uL (ref 0.0–0.1)
Basophils Relative: 1 %
EOS ABS: 0.3 10*3/uL (ref 0.0–0.7)
Eosinophils Relative: 1 %
HEMATOCRIT: 47.2 % — AB (ref 36.0–46.0)
Hemoglobin: 14.3 g/dL (ref 12.0–15.0)
LYMPHS ABS: 6.6 10*3/uL — AB (ref 0.7–4.0)
Lymphocytes Relative: 29 %
MCH: 24.6 pg — ABNORMAL LOW (ref 26.0–34.0)
MCHC: 30.3 g/dL (ref 30.0–36.0)
MCV: 81.2 fL (ref 78.0–100.0)
MONOS PCT: 6 %
Monocytes Absolute: 1.3 10*3/uL — ABNORMAL HIGH (ref 0.1–1.0)
NEUTROS ABS: 14.2 10*3/uL — AB (ref 1.7–7.7)
Neutrophils Relative %: 63 %
Platelets: 297 10*3/uL (ref 150–400)
RBC: 5.81 MIL/uL — AB (ref 3.87–5.11)
RDW: 16.8 % — ABNORMAL HIGH (ref 11.5–15.5)
WBC: 22.4 10*3/uL — AB (ref 4.0–10.5)

## 2016-02-10 LAB — I-STAT ARTERIAL BLOOD GAS, ED
Acid-base deficit: 1 mmol/L (ref 0.0–2.0)
BICARBONATE: 25.8 meq/L — AB (ref 20.0–24.0)
O2 Saturation: 97 %
PCO2 ART: 47.6 mmHg — AB (ref 35.0–45.0)
PH ART: 7.341 — AB (ref 7.350–7.450)
PO2 ART: 96 mmHg (ref 80.0–100.0)
TCO2: 27 mmol/L (ref 0–100)

## 2016-02-10 LAB — I-STAT TROPONIN, ED: Troponin i, poc: 0.04 ng/mL (ref 0.00–0.08)

## 2016-02-10 LAB — URINE MICROSCOPIC-ADD ON

## 2016-02-10 LAB — BRAIN NATRIURETIC PEPTIDE: B NATRIURETIC PEPTIDE 5: 227.6 pg/mL — AB (ref 0.0–100.0)

## 2016-02-10 MED ORDER — ANTISEPTIC ORAL RINSE SOLUTION (CORINZ)
7.0000 mL | Freq: Four times a day (QID) | OROMUCOSAL | Status: DC
  Administered 2016-02-11 – 2016-02-14 (×12): 7 mL via OROMUCOSAL

## 2016-02-10 MED ORDER — PROPOFOL 1000 MG/100ML IV EMUL
INTRAVENOUS | Status: AC
  Administered 2016-02-10: 1000 mg via INTRAVENOUS
  Filled 2016-02-10: qty 100

## 2016-02-10 MED ORDER — PROPOFOL 1000 MG/100ML IV EMUL
INTRAVENOUS | Status: AC
  Administered 2016-02-10: 1000 mg
  Filled 2016-02-10: qty 100

## 2016-02-10 MED ORDER — FAMOTIDINE 40 MG/5ML PO SUSR
20.0000 mg | Freq: Two times a day (BID) | ORAL | Status: DC
  Filled 2016-02-10: qty 2.5

## 2016-02-10 MED ORDER — FUROSEMIDE 10 MG/ML IJ SOLN
40.0000 mg | Freq: Once | INTRAMUSCULAR | Status: AC
  Administered 2016-02-10: 40 mg via INTRAVENOUS
  Filled 2016-02-10: qty 4

## 2016-02-10 MED ORDER — SODIUM CHLORIDE 0.9 % IV SOLN: 250.0000 mL | INTRAVENOUS | Status: DC | PRN

## 2016-02-10 MED ORDER — PROPOFOL 1000 MG/100ML IV EMUL
INTRAVENOUS | Status: AC
  Administered 2016-02-10: 30 ug/kg/min
  Filled 2016-02-10: qty 100

## 2016-02-10 MED ORDER — FUROSEMIDE 10 MG/ML IJ SOLN: 80.0000 mg | Freq: Once | INTRAMUSCULAR | Status: DC

## 2016-02-10 MED ORDER — SUCCINYLCHOLINE CHLORIDE 20 MG/ML IJ SOLN
INTRAMUSCULAR | Status: AC | PRN
  Administered 2016-02-10: 150 mg via INTRAVENOUS

## 2016-02-10 MED ORDER — FAMOTIDINE IN NACL 20-0.9 MG/50ML-% IV SOLN
20.0000 mg | Freq: Two times a day (BID) | INTRAVENOUS | Status: DC
  Administered 2016-02-11 – 2016-02-16 (×12): 20 mg via INTRAVENOUS
  Filled 2016-02-10 (×15): qty 50

## 2016-02-10 MED ORDER — DEXTROSE 5 % IV SOLN
2.0000 g | INTRAVENOUS | Status: DC
  Administered 2016-02-10 – 2016-02-12 (×3): 2 g via INTRAVENOUS
  Filled 2016-02-10 (×4): qty 2

## 2016-02-10 MED ORDER — NITROGLYCERIN IN D5W 200-5 MCG/ML-% IV SOLN
0.0000 ug/min | Freq: Once | INTRAVENOUS | Status: AC
  Administered 2016-02-10: 400 ug/min via INTRAVENOUS

## 2016-02-10 MED ORDER — FUROSEMIDE 10 MG/ML IJ SOLN
80.0000 mg | Freq: Two times a day (BID) | INTRAMUSCULAR | Status: DC
  Administered 2016-02-10: 80 mg via INTRAVENOUS
  Filled 2016-02-10: qty 8

## 2016-02-10 MED ORDER — ETOMIDATE 2 MG/ML IV SOLN
INTRAVENOUS | Status: AC | PRN
  Administered 2016-02-10: 16 mg via INTRAVENOUS

## 2016-02-10 MED ORDER — ENOXAPARIN SODIUM 40 MG/0.4ML ~~LOC~~ SOLN
40.0000 mg | Freq: Every day | SUBCUTANEOUS | Status: DC
  Administered 2016-02-11: 40 mg via SUBCUTANEOUS
  Filled 2016-02-10: qty 0.4

## 2016-02-10 MED ORDER — INSULIN ASPART 100 UNIT/ML ~~LOC~~ SOLN: 2.0000 [IU] | SUBCUTANEOUS | Status: DC

## 2016-02-10 MED ORDER — CHLORHEXIDINE GLUCONATE 0.12% ORAL RINSE (MEDLINE KIT)
15.0000 mL | Freq: Two times a day (BID) | OROMUCOSAL | Status: DC
  Administered 2016-02-11 – 2016-02-13 (×7): 15 mL via OROMUCOSAL

## 2016-02-10 MED ORDER — INSULIN ASPART 100 UNIT/ML ~~LOC~~ SOLN
10.0000 [IU] | Freq: Once | SUBCUTANEOUS | Status: AC
  Administered 2016-02-11: 10 [IU] via SUBCUTANEOUS

## 2016-02-10 MED ORDER — DEXTROSE 5 % IV SOLN
500.0000 mg | INTRAVENOUS | Status: DC
  Administered 2016-02-11 – 2016-02-12 (×3): 500 mg via INTRAVENOUS
  Filled 2016-02-10 (×4): qty 500

## 2016-02-10 MED ORDER — ASPIRIN 300 MG RE SUPP
300.0000 mg | RECTAL | Status: DC
  Filled 2016-02-10: qty 1

## 2016-02-10 MED ORDER — ASPIRIN 81 MG PO CHEW: 324.0000 mg | CHEWABLE_TABLET | ORAL | Status: AC

## 2016-02-10 NOTE — ED Notes (Signed)
Pt. Was standing in the bathroom, and became diphoretic, sob, scared .   Pt. Had auditory rales and ronchi.  Paramedic  Gave 2 Nitro and placed CPap.  Pt. In a few minutes calmed down.  Stemi called in the field.   Pt. Arrived. On CPAP, she opens her eyes when spoken too.    She is very lethargic in Resp. Distress.

## 2016-02-10 NOTE — ED Notes (Signed)
Dr. Verdie Mosher updated pt.s husband and family, Chaplain with the family

## 2016-02-10 NOTE — ED Notes (Addendum)
Pt. Given Nitroglycerin 800 mcg IVP per Dr. Earlene Plater

## 2016-02-10 NOTE — ED Notes (Signed)
Pt. Given Nitroglycerin 800 mcg, IVP per Dr. Earlene Plater

## 2016-02-10 NOTE — Progress Notes (Signed)
   02/10/16 1910  Clinical Encounter Type  Visited With Patient and family together;Health care provider  Visit Type Follow-up;Critical Care;ED   Chaplain helped patient's family receive medical consultation, transition to the patient's room, and extended hospitality and support. Chaplain services available as needed.   Alda Ponder, Chaplain 02/10/2016 7:17 PM

## 2016-02-10 NOTE — ED Notes (Signed)
This nurse unable to locate a vein to draw blood cultures.  IV antibiotics started

## 2016-02-10 NOTE — Progress Notes (Signed)
   02/10/16 1824  Clinical Encounter Type  Visited With Patient not available;Health care provider  Visit Type Initial;Code;ED  Referral From Nurse   Chaplain responded to a code STEMI in the ED. Code STEMI has been cancelled. Per EMS, the patient was with a young teenage son (around 74) when the incident occurred. No family is present at this time. Chaplain services available as needed.   Alda Ponder, Chaplain 02/10/2016 6:25 PM

## 2016-02-10 NOTE — Progress Notes (Signed)
Patient arrived via EMS on CPAP.  Patient was placed on bipap once got to room in ER, however patient still continue to have increased blood pressure, increased respiratory rate, decreased oxygen saturation, and increased use of accessory muscles.  Patient was intubated by ED MD for airway protection.  Positive color change noted.  Bilateral breath sounds heard.  CXR ordered for tube placement.  PEEP was increased to 14 on ventilator due to patient continuing to still have decreased sats to low 80s.  Sats improved to 93%. Will obtain follow up ABG.  Will continue to monitor.

## 2016-02-10 NOTE — ED Provider Notes (Signed)
CSN: [id number]     Arrival date & time 02/10/16  1758 History   First MD Initiated Contact with Patient 02/10/16 1833     Chief Complaint  Patient presents with  . Respiratory Distress     (Consider location/radiation/quality/duration/timing/severity/associated sxs/prior Treatment) HPI  The patient is 67-ish-year-old female who presents with acute onset of shortness of breath.  No further history is available due to absence of any family members. The patient apparently had an acute onset of shortness of breath, and EMS performed an EKG which they were concerned for some anterior ST segment elevation, so they activated prehospital code STEMI., She was having severe shortness of breath hypoxia and pulmonary edema on clinical exam, so she was her on BiPAP. Past Medical History  Diagnosis Date  . Diabetes mellitus   . Hypertension     Does not see a cardiologist  . Sleep apnea     Uses CPAP   Past Surgical History  Procedure Laterality Date  . Breast surgery      cyst right breast  . Anterior cervical decomp/discectomy fusion  08/28/2012    Procedure: ANTERIOR CERVICAL DECOMPRESSION/DISCECTOMY FUSION 2 LEVELS;  Surgeon: Clydene Fake, MD;  Location: [facility];  Service: Neurosurgery;  Laterality: N/A;  Cervical four-five, cervical six-seven Anterior cervical decompression/diskectomy/fusion/LifeNet Bone/Trestle plate   Family History  Problem Relation Age of Onset  . Heart disease Paternal Grandmother 71  . Diabetes Paternal Grandmother   . Hypertension Paternal Grandmother   . Heart disease Paternal Aunt   . Cancer Neg Hx   . Stroke Neg Hx   . Alcohol abuse Neg Hx   . Heart disease Father 77    MI   Social History  Substance Use Topics  . Smoking status: Never Smoker   . Smokeless tobacco: None  . Alcohol Use: Yes     Comment: socially on holidays   OB History    No data available     Review of Systems  Unable to perform ROS: Severe respiratory distress       Allergies  Review of patient's allergies indicates no known allergies.  Home Medications   Prior to Admission medications   Medication Sig Start Date End Date Taking? Authorizing Provider  acetaminophen (TYLENOL) 325 MG tablet Take 2 tablets (650 mg total) by mouth every 6 (six) hours as needed for mild pain (or Fever >/= 101). 11/19/13   Jamal Collin, MD  aspirin EC 81 MG EC tablet Take 1 tablet (81 mg total) by mouth daily. 11/19/13   Jamal Collin, MD  atorvastatin (LIPITOR) 40 MG tablet Take 1 tablet (40 mg total) by mouth daily at 6 PM. 12/18/13   Dessa Phi, MD  carvedilol (COREG) 6.25 MG tablet Take 1 tablet (6.25 mg total) by mouth 2 (two) times daily with a meal. 12/18/13   Josalyn Funches, MD  lisinopril (PRINIVIL,ZESTRIL) 40 MG tablet TAKE ONE TABLET BY MOUTH ONCE DAILY 07/28/15   Uvaldo Rising, MD  metFORMIN (GLUCOPHAGE) 500 MG tablet Take 1 tablet (500 mg total) by mouth 2 (two) times daily with a meal. 12/18/13   Josalyn Funches, MD  nitroGLYCERIN (NITROSTAT) 0.4 MG SL tablet Place 1 tablet (0.4 mg total) under the tongue every 5 (five) minutes as needed for chest pain. 11/19/13   Jamal Collin, MD  pregabalin (LYRICA) 50 MG capsule Take 1 capsule (50 mg total) by mouth 3 (three) times daily. 12/18/13   Dessa Phi, MD  ranitidine (ZANTAC) 150  MG tablet Take 1 tablet (150 mg total) by mouth 2 (two) times daily. 12/02/13   Shelva Majestic, MD   BP 113/48 mmHg  Pulse 87  Temp(Src) 99.1 F (37.3 C) (Rectal)  Resp 24  SpO2 98% Physical Exam  Constitutional: She appears distressed.  Chronically ill appearing, obese female  HENT:  Head: Normocephalic and atraumatic.  Frothy secretions from the mouth EMS BiPAP mask in place  Eyes: Pupils are equal, round, and reactive to light.  Neck: Neck supple.  Cardiovascular: Regular rhythm.  Tachycardia present.   Pulmonary/Chest: She is in respiratory distress. She has rales.  Abdominal: Soft. Bowel sounds are normal.  She exhibits distension.  Musculoskeletal: Normal range of motion.  Neurological: GCS eye subscore is 2. GCS verbal subscore is 3. GCS motor subscore is 6.  Skin: Skin is warm and dry.  Nursing note and vitals reviewed.   ED Course  .Intubation Date/Time: 02/10/2016 6:35 PM Performed by: Erskine Emery Authorized by: Erskine Emery Consent: The procedure was performed in an emergent situation. Time out: Immediately prior to procedure a "time out" was called to verify the correct patient, procedure, equipment, support staff and site/side marked as required. Indications: respiratory failure Intubation method: video-assisted Patient status: paralyzed (RSI) Sedatives: etomidate Paralytic: succinylcholine Tube size: 7.5 mm Tube type: cuffed Number of attempts: 1 Cords visualized: yes Post-procedure assessment: CO2 detector Breath sounds: equal Cuff inflated: yes ETT to lip: 24 cm Tube secured with: ETT holder Patient tolerance: Patient tolerated the procedure well with no immediate complications   (including critical care time) Labs Review Labs Reviewed  BASIC METABOLIC PANEL - Abnormal; Notable for the following:    Chloride 100 (*)    CO2 19 (*)    Glucose, Bld 410 (*)    Creatinine, Ser 1.28 (*)    GFR calc non Af Amer 49 (*)    GFR calc Af Amer 56 (*)    Anion gap 19 (*)    All other components within normal limits  CBC WITH DIFFERENTIAL/PLATELET - Abnormal; Notable for the following:    WBC 22.4 (*)    RBC 5.81 (*)    HCT 47.2 (*)    MCH 24.6 (*)    RDW 16.8 (*)    Neutro Abs 14.2 (*)    Lymphs Abs 6.6 (*)    Monocytes Absolute 1.3 (*)    All other components within normal limits  BRAIN NATRIURETIC PEPTIDE - Abnormal; Notable for the following:    B Natriuretic Peptide 227.6 (*)    All other components within normal limits  URINALYSIS, ROUTINE W REFLEX MICROSCOPIC (NOT AT [facility]) - Abnormal; Notable for the following:    APPearance HAZY (*)    Glucose, UA >1000 (*)     Hgb urine dipstick MODERATE (*)    Protein, ur >300 (*)    All other components within normal limits  URINE MICROSCOPIC-ADD ON - Abnormal; Notable for the following:    Squamous Epithelial / LPF 0-5 (*)    Bacteria, UA RARE (*)    Casts GRANULAR CAST (*)    All other components within normal limits  I-STAT ARTERIAL BLOOD GAS, ED - Abnormal; Notable for the following:    pH, Arterial 7.341 (*)    pCO2 arterial 47.6 (*)    Bicarbonate 25.8 (*)    All other components within normal limits  I-STAT CHEM 8, ED - Abnormal; Notable for the following:    Creatinine, Ser 1.10 (*)    Glucose, Bld  406 (*)    Hemoglobin 18.0 (*)    HCT 53.0 (*)    All other components within normal limits  I-STAT TROPOININ, ED    Imaging Review Dg Chest Portable 1 View  02/10/2016  CLINICAL DATA:  Respiratory failure and status post intubation. EXAM: PORTABLE CHEST 1 VIEW COMPARISON:  11/16/2013 FINDINGS: Endotracheal tube present with the tip approximately 3 cm above the carina. Lungs show diffuse pulmonary edema bilaterally. There may be additional component of right lung pneumonia. No pneumothorax. No significant pleural fluid identified. The heart is mildly enlarged. IMPRESSION: Endotracheal tube tip approximately 3 cm above the carina. Diffuse pulmonary edema present. Potential additional component of right lung pneumonia. Electronically Signed   By: Irish Lack M.D.   On: 02/10/2016 19:02   I have personally reviewed and evaluated these images and lab results as part of my medical decision-making.   EKG Interpretation   Date/Time:  Friday February 10 2016 18:09:26 EDT Ventricular Rate:  122 PR Interval:  174 QRS Duration: 94 QT Interval:  320 QTC Calculation: 456 R Axis:   44 Text Interpretation:  Sinus tachycardia Repol abnrm suggests ischemia,  lateral leads Tachycardia and lateral ischemia new Confirmed by LIU MD,  Annabelle Harman ([id number]) on 02/10/2016 6:41:33 PM      MDM   Final diagnoses:  Flash  pulmonary edema (HCC)    patient presents in severe respiratory distress, likely from flash pulmonary edema. On arrival the patient is obtunded, on EMS CPAP. She is hypertensive, with oxygen saturations in the mid 80s. She was placed on her stretcher, BiPAP was initiated with high PEEP, and 100% FiO2. She was given IV boluses of nitroglycerin, and initiated on a nitroglycerin infusion at 300. Bedside ultrasound performed showed no evidence of pericardial effusion, and depressed left ventricular ejection fraction. IVC dynamics were consistent with volume overload. Ultrasound of the chest showed multiple B lines consistent with pulmonary edema. Due to declining mental status and poor respiratory effort the patient was intubated. She was initiated on a propofol infusion, and eventually her blood pressure improved, and we were able to titrate her nitroglycerin glycerin drip down.  Chest x-ray shows diffuse pulmonary edema, and the radiologist comments on a possible pneumonia, however the patient has been afebrile here and this had a very sudden onset, not consistent with infection.  Erskine Emery, MD 02/11/16 0022  Lavera Guise, MD 02/11/16 [phone]

## 2016-02-10 NOTE — ED Notes (Signed)
Floor unable to take report at this time. Will call back.  

## 2016-02-10 NOTE — H&P (Addendum)
PULMONARY / CRITICAL CARE MEDICINE   Name: TRANISHA TISSUE MRN: [id number] DOB: 07/15/1966    ADMISSION DATE:  02/10/2016 CONSULTATION DATE:  02/10/2016  REFERRING MD: EDP  CHIEF COMPLAINT:  Respiratory Distress  HISTORY OF PRESENT ILLNESS:   Ms. Lamb is a 50 year old female with a past medical history of HFpEF, HTN, DM II, Obesity presenting with acute onset shortness of breath. Family reports the patient was doing well today with no complaints, went home tonight and around 6 PM she was found banging on the bathroom walls in respiratory distress. Reportedly patient was turning blue when she was found. Family reports she has had a viral illness for the past month with most of the family having the flu recently. Reports a history of non-compliance.   On arrival to the ED she was obtunded on CPAP from EMS. She was hypoxic and placed on BIPAP and started on a nitroglycerin infusion. Bedside ultrasound done in the ED showed no evidence of pericardial effuison and depressed left ventricular EF. IVC dynamics were consistent with volume overload. Patient had declining mental status and poor respiratory effort and required intubation. Her blood pressures were unable to tolerate both the propofol and nitroglycerin drip and was titrated off nitro ggt.  PAST MEDICAL HISTORY :  She  has a past medical history of Diabetes mellitus; Hypertension; and Sleep apnea.  PAST SURGICAL HISTORY: She  has past surgical history that includes Breast surgery and Anterior cervical decomp/discectomy fusion (08/28/2012).  No Known Allergies  No current facility-administered medications on file prior to encounter.   Current Outpatient Prescriptions on File Prior to Encounter  Medication Sig  . acetaminophen (TYLENOL) 325 MG tablet Take 2 tablets (650 mg total) by mouth every 6 (six) hours as needed for mild pain (or Fever >/= 101).  Marland Kitchen aspirin EC 81 MG EC tablet Take 1 tablet (81 mg total) by mouth daily.  Marland Kitchen atorvastatin  (LIPITOR) 40 MG tablet Take 1 tablet (40 mg total) by mouth daily at 6 PM.  . carvedilol (COREG) 6.25 MG tablet Take 1 tablet (6.25 mg total) by mouth 2 (two) times daily with a meal.  . lisinopril (PRINIVIL,ZESTRIL) 40 MG tablet TAKE ONE TABLET BY MOUTH ONCE DAILY  . metFORMIN (GLUCOPHAGE) 500 MG tablet Take 1 tablet (500 mg total) by mouth 2 (two) times daily with a meal.  . nitroGLYCERIN (NITROSTAT) 0.4 MG SL tablet Place 1 tablet (0.4 mg total) under the tongue every 5 (five) minutes as needed for chest pain.  . pregabalin (LYRICA) 50 MG capsule Take 1 capsule (50 mg total) by mouth 3 (three) times daily.  . ranitidine (ZANTAC) 150 MG tablet Take 1 tablet (150 mg total) by mouth 2 (two) times daily.    FAMILY HISTORY:  Her indicated that her mother is alive. She indicated that her father is alive. She indicated that her paternal uncle is alive.   SOCIAL HISTORY: She  reports that she has never smoked. She does not have any smokeless tobacco history on file. She reports that she drinks alcohol. She reports that she uses illicit drugs (Marijuana).  REVIEW OF SYSTEMS:   Unable to perform, patient intubated and sedated  SUBJECTIVE:   VITAL SIGNS: BP 113/48 mmHg  Pulse 87  Temp(Src) 99.1 F (37.3 C) (Rectal)  Resp 24  SpO2 98%  HEMODYNAMICS:    VENTILATOR SETTINGS: Vent Mode:  [-] PRVC FiO2 (%):  [100 %] 100 % Set Rate:  [24 bmp] 24 bmp Vt Set:  [  520 mL] 520 mL PEEP:  [5 cmH20-14 cmH20] 5 cmH20 Plateau Pressure:  [31 cmH20-34 cmH20] 31 cmH20  INTAKE / OUTPUT:    PHYSICAL EXAMINATION: General: Morbidly obese female  Neuro: Intubated, sedated on propofol Cardiovascular: RRR, no murmurs rubs or gallops Lungs: mechanical breath sounds, bibasilar crackles Abdomen: Soft, obese, BS+ Skin: warm, dry  LABS:  BMET  Recent Labs Lab 02/10/16 1839 02/10/16 1841  NA 138 139  K 3.6 3.5  CL 100* 101  CO2 19*  --   BUN 9 10  CREATININE 1.28* 1.10*  GLUCOSE 410* 406*     Electrolytes  Recent Labs Lab 02/10/16 1839  CALCIUM 9.2    CBC  Recent Labs Lab 02/10/16 1839 02/10/16 1841  WBC 22.4*  --   HGB 14.3 18.0*  HCT 47.2* 53.0*  PLT 297  --     Coag's No results for input(s): APTT, INR in the last 168 hours.  Sepsis Markers No results for input(s): LATICACIDVEN, PROCALCITON, O2SATVEN in the last 168 hours.  ABG  Recent Labs Lab 02/10/16 2032  PHART 7.341*  PCO2ART 47.6*  PO2ART 96.0    Liver Enzymes No results for input(s): AST, ALT, ALKPHOS, BILITOT, ALBUMIN in the last 168 hours.  Cardiac Enzymes No results for input(s): TROPONINI, PROBNP in the last 168 hours.  Glucose No results for input(s): GLUCAP in the last 168 hours.  Imaging Dg Chest Portable 1 View  02/10/2016  CLINICAL DATA:  Respiratory failure and status post intubation. EXAM: PORTABLE CHEST 1 VIEW COMPARISON:  11/16/2013 FINDINGS: Endotracheal tube present with the tip approximately 3 cm above the carina. Lungs show diffuse pulmonary edema bilaterally. There may be additional component of right lung pneumonia. No pneumothorax. No significant pleural fluid identified. The heart is mildly enlarged. IMPRESSION: Endotracheal tube tip approximately 3 cm above the carina. Diffuse pulmonary edema present. Potential additional component of right lung pneumonia. Electronically Signed   By: Irish Lack M.D.   On: 02/10/2016 19:02   STUDIES:  02/10/2016 CXR: Diffuse pulmonary edema bilaterally, cardiomegaly and rotation limit film quality  CULTURES: 02/10/2016 BCx x2 >> 02/10/2016 Trach aspirate >>  ANTIBIOTICS: 02/10/2016 Ceftriaxone 2g daily 02/10/2016 Azithromycin 500 mg daily  SIGNIFICANT EVENTS:  LINES/TUBES: 02/10/2016 PIV left hand, right hand 02/10/2016 7.5 mm Endotracheal tube 02/10/2016 OG Tube  02/10/2016 Foley   DISCUSSION: 50 year old female with a history of HTN, G2 diastolic HF with a history of non-compliance presents with acute onset respiratory distress  requiring intubation likely secondary to flash pulmonary edema with possible underlying pneumonia  ASSESSMENT / PLAN:  PULMONARY A: Acute Respiratory Failure Bilateral Pulmonary Edema Possible CAP P:   Suspect flush pulmonary edema; will tx with PPV and diuresis + BP management Maintain on PRVC for now, Vent bundle Diuresis  CARDIOVASCULAR A:  Flash pulmonary edema due to HTN HFpEF - ECHO 11/2013 EF 55-60%, G2 Diastolic Dysfunction, Mildly Dilated Left Atrium P:  As above Lasix 80 mg IV x1, follow urine output ECHO Repeat Troponin to  eval for significant ischemia  RENAL A:   No abnormalities P:   No interventions  GASTROINTESTINAL A:   GI Ulcer PPx P:   Pepcid PO  HEMATOLOGIC A:   DVT PPx P:  Lovenox  INFECTIOUS A:   Leukocytosis Possible CAP P:   Ceftriaxone 2g daily Azithromycin 500 mg daily Follow up blood cultures and tracheal aspirate CBC, BMET in am Flu PCR  ENDOCRINE A:   DM II P:   SSI  NEUROLOGIC  A:   Intubated and Sedated P:   Propofol ggt RASS goal: -1   FAMILY  Updated on admission.  - Inter-disciplinary family meet or Palliative Care meeting due by:  02/17/2016    Pulmonary and Critical Care Medicine [facility] Pager: [phone]  02/10/2016, 10:10 PM  ____________________________________ STAFF NOTE: I have personally seen and evaluated the patient and reviewed the available data. I have discussed the patient with the housestaff officer or NP. I agree with the resident's note as documented above.  Briefly: this is a 50 y/o woman with diastolic HF who presents with flash pulmonary edema requiring mechanical ventilation. Possible infectious process, will eval for PNA, flu.  The patient is critically ill with multiple organ systems failure and requires high complexity decision making for assessment and support, frequent evaluation and titration of therapies, application of advanced monitoring technologies and  extensive interpretation of multiple databases. Critical Care Time devoted to patient care services described in this note is 60 minutes.  Jamie Kato, MD

## 2016-02-11 ENCOUNTER — Inpatient Hospital Stay (HOSPITAL_COMMUNITY): Payer: BLUE CROSS/BLUE SHIELD

## 2016-02-11 DIAGNOSIS — J810 Acute pulmonary edema: Secondary | ICD-10-CM | POA: Insufficient documentation

## 2016-02-11 DIAGNOSIS — I214 Non-ST elevation (NSTEMI) myocardial infarction: Secondary | ICD-10-CM

## 2016-02-11 DIAGNOSIS — J9601 Acute respiratory failure with hypoxia: Secondary | ICD-10-CM

## 2016-02-11 DIAGNOSIS — I5033 Acute on chronic diastolic (congestive) heart failure: Secondary | ICD-10-CM

## 2016-02-11 DIAGNOSIS — R079 Chest pain, unspecified: Secondary | ICD-10-CM

## 2016-02-11 LAB — INFLUENZA PANEL BY PCR (TYPE A & B)
H1N1FLUPCR: NOT DETECTED
INFLBPCR: NEGATIVE
Influenza A By PCR: NEGATIVE

## 2016-02-11 LAB — HEPARIN LEVEL (UNFRACTIONATED): Heparin Unfractionated: 0.13 IU/mL — ABNORMAL LOW (ref 0.30–0.70)

## 2016-02-11 LAB — BASIC METABOLIC PANEL
ANION GAP: 12 (ref 5–15)
BUN: 10 mg/dL (ref 6–20)
CALCIUM: 9 mg/dL (ref 8.9–10.3)
CHLORIDE: 98 mmol/L — AB (ref 101–111)
CO2: 27 mmol/L (ref 22–32)
CREATININE: 1.16 mg/dL — AB (ref 0.44–1.00)
GFR calc non Af Amer: 55 mL/min — ABNORMAL LOW (ref >60)
Glucose, Bld: 258 mg/dL — ABNORMAL HIGH (ref 65–99)
Potassium: 3.7 mmol/L (ref 3.5–5.1)
SODIUM: 137 mmol/L (ref 135–145)

## 2016-02-11 LAB — BLOOD GAS, ARTERIAL
Acid-Base Excess: 3.8 mmol/L — ABNORMAL HIGH (ref 0.0–2.0)
BICARBONATE: 28.1 meq/L — AB (ref 20.0–24.0)
Drawn by: 23604
FIO2: 0.6
O2 Saturation: 97.4 %
PATIENT TEMPERATURE: 98.6
PCO2 ART: 44.4 mmHg (ref 35.0–45.0)
PEEP: 5 cmH2O
RATE: 24 resp/min
TCO2: 29.5 mmol/L (ref 0–100)
VT: 380 mL
pH, Arterial: 7.418 (ref 7.350–7.450)
pO2, Arterial: 94 mmHg (ref 80.0–100.0)

## 2016-02-11 LAB — GLUCOSE, CAPILLARY
GLUCOSE-CAPILLARY: 252 mg/dL — AB (ref 65–99)
GLUCOSE-CAPILLARY: 205 mg/dL — AB (ref 65–99)
GLUCOSE-CAPILLARY: 212 mg/dL — AB (ref 65–99)
GLUCOSE-CAPILLARY: 149 mg/dL — AB (ref 65–99)
GLUCOSE-CAPILLARY: 143 mg/dL — AB (ref 65–99)
GLUCOSE-CAPILLARY: 200 mg/dL — AB (ref 65–99)
Glucose-Capillary: 129 mg/dL — ABNORMAL HIGH (ref 65–99)
Glucose-Capillary: 143 mg/dL — ABNORMAL HIGH (ref 65–99)
Glucose-Capillary: 148 mg/dL — ABNORMAL HIGH (ref 65–99)
Glucose-Capillary: 150 mg/dL — ABNORMAL HIGH (ref 65–99)
Glucose-Capillary: 153 mg/dL — ABNORMAL HIGH (ref 65–99)
Glucose-Capillary: 162 mg/dL — ABNORMAL HIGH (ref 65–99)
Glucose-Capillary: 232 mg/dL — ABNORMAL HIGH (ref 65–99)
Glucose-Capillary: 241 mg/dL — ABNORMAL HIGH (ref 65–99)
Glucose-Capillary: 287 mg/dL — ABNORMAL HIGH (ref 65–99)
Glucose-Capillary: 288 mg/dL — ABNORMAL HIGH (ref 65–99)

## 2016-02-11 LAB — CBC
HEMATOCRIT: 39.7 % (ref 36.0–46.0)
HEMOGLOBIN: 12 g/dL (ref 12.0–15.0)
MCH: 23.9 pg — ABNORMAL LOW (ref 26.0–34.0)
MCHC: 30.2 g/dL (ref 30.0–36.0)
MCV: 79.1 fL (ref 78.0–100.0)
Platelets: 191 10*3/uL (ref 150–400)
RBC: 5.02 MIL/uL (ref 3.87–5.11)
RDW: 16.6 % — ABNORMAL HIGH (ref 11.5–15.5)
WBC: 15.8 10*3/uL — AB (ref 4.0–10.5)

## 2016-02-11 LAB — ECHOCARDIOGRAM COMPLETE
HEIGHTINCHES: 61 in
WEIGHTICAEL: 3876.57 [oz_av]

## 2016-02-11 LAB — TRIGLYCERIDES: Triglycerides: 749 mg/dL — ABNORMAL HIGH (ref <150)

## 2016-02-11 LAB — MRSA PCR SCREENING: MRSA BY PCR: NEGATIVE

## 2016-02-11 LAB — TROPONIN I
Troponin I: 1.01 ng/mL (ref <0.031)
Troponin I: 7.2 ng/mL (ref <0.031)

## 2016-02-11 LAB — MAGNESIUM: Magnesium: 1.9 mg/dL (ref 1.7–2.4)

## 2016-02-11 LAB — PHOSPHORUS: Phosphorus: 3.7 mg/dL (ref 2.5–4.6)

## 2016-02-11 LAB — POTASSIUM: POTASSIUM: 3.3 mmol/L — AB (ref 3.5–5.1)

## 2016-02-11 MED ORDER — INSULIN ASPART 100 UNIT/ML ~~LOC~~ SOLN
0.0000 [IU] | SUBCUTANEOUS | Status: DC
  Administered 2016-02-11: 2 [IU] via SUBCUTANEOUS
  Administered 2016-02-11: 1 [IU] via SUBCUTANEOUS
  Administered 2016-02-12 (×2): 2 [IU] via SUBCUTANEOUS
  Administered 2016-02-12 (×2): 3 [IU] via SUBCUTANEOUS
  Administered 2016-02-12: 7 [IU] via SUBCUTANEOUS
  Administered 2016-02-12 – 2016-02-13 (×2): 2 [IU] via SUBCUTANEOUS
  Administered 2016-02-13: 9 [IU] via SUBCUTANEOUS
  Administered 2016-02-13: 5 [IU] via SUBCUTANEOUS
  Administered 2016-02-13: 2 [IU] via SUBCUTANEOUS
  Administered 2016-02-13 (×2): 3 [IU] via SUBCUTANEOUS
  Administered 2016-02-14: 2 [IU] via SUBCUTANEOUS
  Administered 2016-02-14 (×2): 3 [IU] via SUBCUTANEOUS
  Administered 2016-02-14: 2 [IU] via SUBCUTANEOUS
  Administered 2016-02-14: 3 [IU] via SUBCUTANEOUS
  Administered 2016-02-14 – 2016-02-16 (×8): 2 [IU] via SUBCUTANEOUS

## 2016-02-11 MED ORDER — HEPARIN BOLUS VIA INFUSION
3500.0000 [IU] | Freq: Once | INTRAVENOUS | Status: AC
  Administered 2016-02-11: 3500 [IU] via INTRAVENOUS
  Filled 2016-02-11: qty 3500

## 2016-02-11 MED ORDER — HEPARIN (PORCINE) IN NACL 100-0.45 UNIT/ML-% IJ SOLN
1400.0000 [IU]/h | INTRAMUSCULAR | Status: DC
  Administered 2016-02-11: 900 [IU]/h via INTRAVENOUS
  Filled 2016-02-11 (×4): qty 250

## 2016-02-11 MED ORDER — ASPIRIN 300 MG RE SUPP
300.0000 mg | RECTAL | Status: AC
  Administered 2016-02-11: 300 mg via RECTAL

## 2016-02-11 MED ORDER — INSULIN GLARGINE 100 UNIT/ML ~~LOC~~ SOLN
10.0000 [IU] | Freq: Every day | SUBCUTANEOUS | Status: DC
  Administered 2016-02-11 – 2016-02-16 (×6): 10 [IU] via SUBCUTANEOUS
  Filled 2016-02-11 (×9): qty 0.1

## 2016-02-11 MED ORDER — FENTANYL BOLUS VIA INFUSION
50.0000 ug | INTRAVENOUS | Status: DC | PRN
  Filled 2016-02-11: qty 50

## 2016-02-11 MED ORDER — MIDAZOLAM HCL 2 MG/2ML IJ SOLN
2.0000 mg | INTRAMUSCULAR | Status: AC | PRN
  Administered 2016-02-11 (×3): 2 mg via INTRAVENOUS
  Filled 2016-02-11 (×3): qty 2

## 2016-02-11 MED ORDER — ASPIRIN 325 MG PO TABS
325.0000 mg | ORAL_TABLET | Freq: Every day | ORAL | Status: DC
  Administered 2016-02-11 – 2016-02-15 (×5): 325 mg via ORAL
  Filled 2016-02-11 (×6): qty 1

## 2016-02-11 MED ORDER — SODIUM CHLORIDE 0.9 % IV SOLN
INTRAVENOUS | Status: DC
  Administered 2016-02-11: 2.2 [IU]/h via INTRAVENOUS
  Administered 2016-02-11 (×2): 4.5 [IU]/h via INTRAVENOUS
  Administered 2016-02-11: 4.2 [IU]/h via INTRAVENOUS
  Administered 2016-02-11: 4.1 [IU]/h via INTRAVENOUS
  Administered 2016-02-11: 5.6 [IU]/h via INTRAVENOUS
  Filled 2016-02-11: qty 2.5

## 2016-02-11 MED ORDER — FENTANYL CITRATE (PF) 100 MCG/2ML IJ SOLN
100.0000 ug | INTRAMUSCULAR | Status: DC | PRN
Start: 2016-02-11 — End: 2016-02-13
  Administered 2016-02-11 (×2): 100 ug via INTRAVENOUS
  Filled 2016-02-11: qty 2

## 2016-02-11 MED ORDER — HEPARIN BOLUS VIA INFUSION
2000.0000 [IU] | Freq: Once | INTRAVENOUS | Status: AC
  Administered 2016-02-11: 2000 [IU] via INTRAVENOUS
  Filled 2016-02-11: qty 2000

## 2016-02-11 MED ORDER — PROPOFOL 1000 MG/100ML IV EMUL
0.0000 ug/kg/min | INTRAVENOUS | Status: DC
  Administered 2016-02-11 (×3): 50 ug/kg/min via INTRAVENOUS
  Filled 2016-02-11 (×4): qty 100

## 2016-02-11 MED ORDER — POTASSIUM CHLORIDE 10 MEQ/100ML IV SOLN
10.0000 meq | INTRAVENOUS | Status: AC
  Administered 2016-02-11 – 2016-02-12 (×3): 10 meq via INTRAVENOUS
  Filled 2016-02-11 (×3): qty 100

## 2016-02-11 MED ORDER — FENTANYL CITRATE (PF) 2500 MCG/50ML IJ SOLN
25.0000 ug/h | INTRAMUSCULAR | Status: DC
  Administered 2016-02-11: 100 ug/h via INTRAVENOUS
  Filled 2016-02-11 (×3): qty 50

## 2016-02-11 MED ORDER — FUROSEMIDE 10 MG/ML IJ SOLN
40.0000 mg | Freq: Two times a day (BID) | INTRAMUSCULAR | Status: DC
  Administered 2016-02-11 – 2016-02-13 (×5): 40 mg via INTRAVENOUS
  Filled 2016-02-11 (×7): qty 4

## 2016-02-11 MED ORDER — DOCUSATE SODIUM 50 MG/5ML PO LIQD: 100.0000 mg | Freq: Two times a day (BID) | ORAL | Status: DC | PRN

## 2016-02-11 MED ORDER — FENTANYL CITRATE (PF) 100 MCG/2ML IJ SOLN: 50.0000 ug | Freq: Once | INTRAMUSCULAR | Status: DC

## 2016-02-11 MED ORDER — FENTANYL CITRATE (PF) 100 MCG/2ML IJ SOLN
100.0000 ug | INTRAMUSCULAR | Status: DC | PRN
  Administered 2016-02-11: 100 ug via INTRAVENOUS
  Filled 2016-02-11 (×2): qty 2

## 2016-02-11 MED ORDER — MIDAZOLAM HCL 2 MG/2ML IJ SOLN: 2.0000 mg | INTRAMUSCULAR | Status: DC | PRN

## 2016-02-11 NOTE — Progress Notes (Signed)
eLink Physician-Brief Progress Note Patient Name: [name] DOB: January 15, 1966 MRN: [id number]   Date of Service  02/11/2016  HPI/Events of Note  K+ = 3.3 and Creatinine = 1.16.  eICU Interventions  Will replete K+.     Intervention Category Intermediate Interventions: Electrolyte abnormality - evaluation and management  Lenell Antu 02/11/2016, 7:36 PM

## 2016-02-11 NOTE — Progress Notes (Signed)
Initial Nutrition Assessment  DOCUMENTATION CODES:  Morbid obesity  INTERVENTION:  If unable to extubate within 24 hours, recommend TF via OG/NGT with VHP at 10 ml/h.  To minimize overfeeding, While on high Propofol rate, this will be goal rate.  Prostat 60 ml TID    TF provides  840 kcals (+792 from sedation), 111 gm protein, 201 ml free water daily.  NUTRITION DIAGNOSIS:  Inadequate oral intake related to inability to eat as evidenced by NPO status.  GOAL:  Provide needs based on ASPEN/SCCM guidelines  MONITOR:  Diet advancement, Vent status, Labs, TF tolerance, I & O's  REASON FOR ASSESSMENT:  Ventilator    ASSESSMENT:  50 y/o female PMHx HF, HTN, DM who presented with acute respiratory distress secondary to flash pulmonary edema w/ potential PNA. Required emergent intubation  RD spoke with family members. They report this as a very abrupt event. No recent changes in appetite or weight. No recent episodes of n/v/c/d. Pt did not take any supplements at home  Pt was very agitated. Required increase in propofol.   Patient is currently intubated on ventilator support MV: 10.1 L/min Temp (24hrs), Avg:99 F (37.2 C), Min:98.8 F (37.1 C), Max:99.1 F (37.3 C)  Propofol:  30 ml/hr: 792 kcals  Diet Order:  Diet Heart Room service appropriate?: Yes; Fluid consistency:: Thin  Skin: Dry  Last BM:  Unknown  Height:  Ht Readings from Last 1 Encounters:  02/10/16 5\' 1"  (1.549 m)   Weight:  Wt Readings from Last 1 Encounters:  02/11/16 242 lb 4.6 oz (109.9 kg)   Wt Readings from Last 10 Encounters:  02/11/16 242 lb 4.6 oz (109.9 kg)  04/27/14 240 lb (108.863 kg)  12/18/13 237 lb 8 oz (107.729 kg)  12/02/13 238 lb (107.956 kg)  11/23/13 236 lb 6.4 oz (107.23 kg)  11/19/13 231 lb 14.4 oz (105.189 kg)  10/07/13 300 lb (136.079 kg)  08/27/12 237 lb 10.5 oz (107.8 kg)  08/19/12 242 lb 4.8 oz (109.907 kg)  08/11/12 248 lb (112.492 kg)   Ideal Body Weight:  47.73  kg  BMI:  Body mass index is 45.8 kg/(m^2).  Estimated Nutritional Needs:  Kcal:  5400-8676 Kcals (11-14 Kcal/kg bw) Protein:  95-119 g (2-2.5 g/kg IBW) Fluid:  Per MD  EDUCATION NEEDS:  No education needs identified at this time  Christophe Louis RD, LDN Clinical Nutrition Pager: [phone] 02/11/2016 12:04 PM

## 2016-02-11 NOTE — Progress Notes (Signed)
PULMONARY / CRITICAL CARE MEDICINE   Name: [name] MRN: [id number] DOB: 1965/12/24    ADMISSION DATE:  02/10/2016 CONSULTATION DATE:  02/10/2016  REFERRING MD: EDP  CHIEF COMPLAINT:  Respiratory Distress  HISTORY OF PRESENT ILLNESS:   [name] is a 50 year old female with a past medical history of HFpEF, HTN, DM II, Obesity presenting with acute onset shortness of breath. Family reports the patient was doing well today with no complaints, went home tonight and around 6 PM she was found banging on the bathroom walls in respiratory distress. Reportedly patient was turning blue when she was found. Family reports she has had a viral illness for the past month with most of the family having the flu recently. Reports a history of non-compliance.   On arrival to the ED she was obtunded on CPAP from EMS. She was hypoxic and placed on BIPAP and started on a nitroglycerin infusion. Bedside ultrasound done in the ED showed no evidence of pericardial effuison and depressed left ventricular EF. IVC dynamics were consistent with volume overload. Patient had declining mental status and poor respiratory effort and required intubation. Her blood pressures were unable to tolerate both the propofol and nitroglycerin drip and was titrated off nitro ggt.   SUBJECTIVE:  Sedated, intubated. No issues overnight  VITAL SIGNS: BP 121/67 mmHg  Pulse 88  Temp(Src) 99.1 F (37.3 C) (Oral)  Resp 24  Ht 5\' 1"  (1.549 m)  Wt 242 lb 4.6 oz (109.9 kg)  BMI 45.80 kg/m2  SpO2 98%  HEMODYNAMICS:    VENTILATOR SETTINGS: Vent Mode:  [-] PRVC FiO2 (%):  [50 %-100 %] 50 % Set Rate:  [24 bmp] 24 bmp Vt Set:  [380 mL-520 mL] 380 mL PEEP:  [5 cmH20-14 cmH20] 5 cmH20 Plateau Pressure:  [21 cmH20-34 cmH20] 22 cmH20  INTAKE / OUTPUT: I/O last 3 completed shifts: In: 954.8 [I.V.:594.8; Other:30; NG/GT:30; IV Piggyback:300] Out: 2450 [Urine:2150; Emesis/NG output:300]  PHYSICAL EXAMINATION: General: Morbidly obese  female . sedated Neuro: Intubated, sedated on propofol Cardiovascular: RRR, no murmurs rubs or gallops Lungs: mechanical breath sounds, bibasilar crackles Abdomen: Soft, obese, BS+ Skin: warm, dry  LABS:  BMET  Recent Labs Lab 02/10/16 1839 02/10/16 1841 02/11/16 0607  NA 138 139 137  K 3.6 3.5 3.7  CL 100* 101 98*  CO2 19*  --  27  BUN 9 10 10   CREATININE 1.28* 1.10* 1.16*  GLUCOSE 410* 406* 258*    Electrolytes  Recent Labs Lab 02/10/16 1839 02/11/16 0607  CALCIUM 9.2 9.0    CBC  Recent Labs Lab 02/10/16 1839 02/10/16 1841 02/11/16 0607  WBC 22.4*  --  15.8*  HGB 14.3 18.0* 12.0  HCT 47.2* 53.0* 39.7  PLT 297  --  191    Coag's No results for input(s): APTT, INR in the last 168 hours.  Sepsis Markers No results for input(s): LATICACIDVEN, PROCALCITON, O2SATVEN in the last 168 hours.  ABG  Recent Labs Lab 02/10/16 2032 02/11/16 0410  PHART 7.341* 7.418  PCO2ART 47.6* 44.4  PO2ART 96.0 94.0    Liver Enzymes No results for input(s): AST, ALT, ALKPHOS, BILITOT, ALBUMIN in the last 168 hours.  Cardiac Enzymes  Recent Labs Lab 02/10/16 2355 02/11/16 0607  TROPONINI 1.01* 7.20*    Glucose  Recent Labs Lab 02/11/16 0514 02/11/16 0616 02/11/16 0652 02/11/16 0756 02/11/16 0827 02/11/16 0918  GLUCAP 252* 241* 232* 212* 205* 162*    Imaging Dg Chest Portable 1 View  02/10/2016  CLINICAL DATA:  Respiratory failure and status post intubation. EXAM: PORTABLE CHEST 1 VIEW COMPARISON:  11/16/2013 FINDINGS: Endotracheal tube present with the tip approximately 3 cm above the carina. Lungs show diffuse pulmonary edema bilaterally. There may be additional component of right lung pneumonia. No pneumothorax. No significant pleural fluid identified. The heart is mildly enlarged. IMPRESSION: Endotracheal tube tip approximately 3 cm above the carina. Diffuse pulmonary edema present. Potential additional component of right lung pneumonia.  Electronically Signed   By: Irish Lack M.D.   On: 02/10/2016 19:02   STUDIES:  02/10/2016 CXR: Diffuse pulmonary edema bilaterally, cardiomegaly and rotation limit film quality  CULTURES: 02/10/2016 BCx x2 >> 02/10/2016 Trach aspirate >>  ANTIBIOTICS: 02/10/2016 Ceftriaxone 2g daily 02/10/2016 Azithromycin 500 mg daily  SIGNIFICANT EVENTS:  LINES/TUBES: 02/10/2016 PIV left hand, right hand 02/10/2016 7.5 mm Endotracheal tube 02/10/2016 OG Tube  02/10/2016 Foley   DISCUSSION: 50 year old female with a history of HTN, G2 diastolic HF with a history of non-compliance presents with acute onset respiratory distress requiring intubation likely secondary to flash pulmonary edema with possible underlying pneumonia  ASSESSMENT / PLAN:  PULMONARY A: Acute Hypoxemic hypercapneic Respiratory Failure Bilateral Pulmonary Edema Possible CAP P:   Suspect flush pulmonary edema; will tx with PPV and diuresis + BP management Maintain on PRVC for now, Vent bundle Diuresis  CARDIOVASCULAR A:  NSTEMI/demand ischemia Flash pulmonary edema due to HTN HFpEF - ECHO 11/2013 EF 55-60%, G2 Diastolic Dysfunction, Mildly Dilated Left Atrium P:  As above Cont diuresis ECHO Pt with elevated troponin > will consult cards. Not sure if primary event for SOB is ACS.   RENAL A:   Pulm edema P:   diuresce  GASTROINTESTINAL A:   GI Ulcer PPx P:   Pepcid PO  HEMATOLOGIC A:   DVT PPx P:  Heparin drip for acs  INFECTIOUS A:   Leukocytosis Possible CAP P:   Ceftriaxone 2g daily Azithromycin 500 mg daily Follow up blood cultures and tracheal aspirate CBC, BMET in am Flu PCR  ENDOCRINE A:   DM II P:   SSI  NEUROLOGIC A:   Intubated and Sedated P:   Propofol ggt RASS goal: -1   FAMILY  Updated on admission.  - Inter-disciplinary family meet or Palliative Care meeting due by:  02/17/2016  Critical care time with this pt today: 35 minutes.   Pollie Meyer, MD 02/11/2016, 11:05  AM [facility] Pager [phone] After 3 pm or if no answer, call [phone]

## 2016-02-11 NOTE — Progress Notes (Signed)
ANTICOAGULATION CONSULT NOTE - Initial Consult  Pharmacy Consult:  Heparin Indication: chest pain/ACS  No Known Allergies  Patient Measurements: Height: 5\' 1"  (154.9 cm) Weight: 242 lb 4.6 oz (109.9 kg) IBW/kg (Calculated) : 47.8 Heparin Dosing Weight: 75 kg  Vital Signs: Temp: 99.1 F (37.3 C) (04/08 0825) Temp Source: Oral (04/08 0825) BP: 121/67 mmHg (04/08 1000) Pulse Rate: 88 (04/08 1000)  Labs:  Recent Labs  02/10/16 1839 02/10/16 1841 02/10/16 2355 02/11/16 0607  HGB 14.3 18.0*  --  12.0  HCT 47.2* 53.0*  --  39.7  PLT 297  --   --  191  CREATININE 1.28* 1.10*  --  1.16*  TROPONINI  --   --  1.01* 7.20*    Estimated Creatinine Clearance: 68 mL/min (by C-G formula based on Cr of 1.16).   Medical History: Past Medical History  Diagnosis Date  . Diabetes mellitus   . Hypertension     Does not see a cardiologist  . Sleep apnea     Uses CPAP      Assessment: 49 YOF admitted with respiratory distress from flash pulmonary edema.  Now with elevated troponin and EKG showing lateral ischemia.  Pharmacy consulted to initiate IV heparin for ACS.  Baseline labs reviewed.  Patient last received prophylactic Lovenox around midnight last night.   Goal of Therapy:  Heparin level 0.3-0.7 units/ml Monitor platelets by anticoagulation protocol: Yes    Plan:  - D/C Lovenox - Heparin 3500 units IV bolus x 1, then - Heparin gtt at 900 units/hr - Check 6 hr HL - Daily HL / CBC - Monitor TG   [name] D. Laney Potash, PharmD, BCPS Pager:  [phone] 02/11/2016, 11:12 AM

## 2016-02-11 NOTE — Progress Notes (Signed)
ANTICOAGULATION CONSULT NOTE  Pharmacy Consult:  Heparin Indication: chest pain/ACS  No Known Allergies  Patient Measurements: Height: 5\' 1"  (154.9 cm) Weight: 242 lb 4.6 oz (109.9 kg) IBW/kg (Calculated) : 47.8 Heparin Dosing Weight: 75 kg  Vital Signs: Temp: 99.3 F (37.4 C) (04/08 1618) Temp Source: Oral (04/08 1618) BP: 165/78 mmHg (04/08 1830) Pulse Rate: 84 (04/08 1830)  Labs:  Recent Labs  02/10/16 1839 02/10/16 1841 02/10/16 2355 02/11/16 0607 02/11/16 1758  HGB 14.3 18.0*  --  12.0  --   HCT 47.2* 53.0*  --  39.7  --   PLT 297  --   --  191  --   HEPARINUNFRC  --   --   --   --  0.13*  CREATININE 1.28* 1.10*  --  1.16*  --   TROPONINI  --   --  1.01* 7.20*  --     Estimated Creatinine Clearance: 68 mL/min (by C-G formula based on Cr of 1.16).   Medical History: Past Medical History  Diagnosis Date  . Diabetes mellitus   . Hypertension     Does not see a cardiologist  . Sleep apnea     Uses CPAP      Assessment: 49 YOF admitted with respiratory distress from flash pulmonary edema.  Now with elevated troponin and EKG showing lateral ischemia.  Pharmacy consulted to initiate IV heparin for ACS.  Baseline labs reviewed.  Patient last received prophylactic Lovenox around midnight last night.  Initial HL low at 0.13   Goal of Therapy:  Heparin level 0.3-0.7 units/ml Monitor platelets by anticoagulation protocol: Yes    Plan:  Heparin 2000 units iv bolus x 1 Heparin drip to 1100 units / hr Follow up AM heparin level  Thank you Okey Regal, PharmD [phone]  02/11/2016, 7:02 PM

## 2016-02-11 NOTE — Progress Notes (Signed)
Pt placed on droplet precautions d/t order for flu swab.  Patient's family educated.

## 2016-02-11 NOTE — Progress Notes (Signed)
CRITICAL VALUE ALERT  Critical value received:  Trop-1.01  Date of notification:  02/11/2016  Time of notification:  0140  Critical value read back:Yes.    Nurse who received alert:  Terrilyn Saver  MD notified (1st page):  Dr. Karma Greaser  Time of first page:  1:54 AM  MD notified (2nd page):  Time of second page:  Responding MD:  Dr. Karma Greaser  Time MD responded:  1:54 AM

## 2016-02-11 NOTE — Progress Notes (Addendum)
Pt flu swab negative.  Pt taken off droplet precautions.  No family available to educate.  Will update in AM.

## 2016-02-11 NOTE — Consult Note (Signed)
CONSULT NOTE  Date: 02/11/2016               Patient Name:  [name] MRN: [id number]  DOB: [birth year] Age / Sex: 50 y.o., female        PCP: Dossie Arbour, J Primary Cardiologist: Burt Knack ( consult 11/17/13)             Referring Physician: Titus Mould              Reason for Consult: CHF            History of Present Illness: Patient is a 50 y.o. female with a PMHx of HTN, HFpEF, DM, OAS , who was admitted to [facility] on 02/10/2016 for evaluation of respiratory failure.  . Pt is not seen regularly in the office.   Has been seen by Dr. Burt Knack in the hospital in Jan. 2015. Hx is from family member in the room. Pt has not been compliant with meds. ( not taking lasix)  Has not been compliant with CPAP Still eats salt   presented last night with respiratory failure, was intubated.  Is awake and alert.   Nodded and shook her head in response to questions   No recent CP Increasing dyspnea for the past week    Medications: Outpatient medications: Prescriptions prior to admission  Medication Sig Dispense Refill Last Dose  . acetaminophen (TYLENOL) 325 MG tablet Take 2 tablets (650 mg total) by mouth every 6 (six) hours as needed for mild pain (or Fever >/= 101). 60 tablet 0 Taking  . aspirin EC 81 MG EC tablet Take 1 tablet (81 mg total) by mouth daily. 30 tablet 0 Taking  . atorvastatin (LIPITOR) 40 MG tablet Take 1 tablet (40 mg total) by mouth daily at 6 PM. 90 tablet 1 Taking  . carvedilol (COREG) 6.25 MG tablet Take 1 tablet (6.25 mg total) by mouth 2 (two) times daily with a meal. 60 tablet 2 Taking  . lisinopril (PRINIVIL,ZESTRIL) 40 MG tablet TAKE ONE TABLET BY MOUTH ONCE DAILY 30 tablet 0   . metFORMIN (GLUCOPHAGE) 500 MG tablet Take 1 tablet (500 mg total) by mouth 2 (two) times daily with a meal. 60 tablet 2 Taking  . nitroGLYCERIN (NITROSTAT) 0.4 MG SL tablet Place 1 tablet (0.4 mg total) under the tongue every 5 (five) minutes as needed for chest pain. 10 tablet 0 Not  Taking  . pregabalin (LYRICA) 50 MG capsule Take 1 capsule (50 mg total) by mouth 3 (three) times daily. 90 capsule 1 Taking  . ranitidine (ZANTAC) 150 MG tablet Take 1 tablet (150 mg total) by mouth 2 (two) times daily. 60 tablet 5 Taking    Current medications: Current Facility-Administered Medications  Medication Dose Route Frequency Provider Last Rate Last Dose  . 0.9 %  sodium chloride infusion  250 mL Intravenous PRN Lucious Groves, DO 10 mL/hr at 02/10/16 2336 250 mL at 02/10/16 2336  . antiseptic oral rinse solution (CORINZ)  7 mL Mouth Rinse QID Luz Brazen, MD   7 mL at 02/11/16 0405  . aspirin tablet 325 mg  325 mg Oral Daily [place], MD      . azithromycin [facility]) 500 mg in dextrose 5 % 250 mL IVPB  500 mg Intravenous Q24H Lucious Groves, DO   500 mg at 02/11/16 0003  . cefTRIAXone (ROCEPHIN) 2 g in dextrose 5 % 50 mL IVPB  2 g Intravenous Q24H Rachel Moulds  Hoffman, DO   Stopped at 02/10/16 2320  . chlorhexidine gluconate (SAGE KIT) (PERIDEX) 0.12 % solution 15 mL  15 mL Mouth Rinse BID Luz Brazen, MD   15 mL at 02/11/16 0809  . docusate (COLACE) 50 MG/5ML liquid 100 mg  100 mg Per Tube BID PRN Granite, MD      . famotidine (PEPCID) IVPB 20 mg premix  20 mg Intravenous Q12H Lucious Groves, DO   20 mg at 02/11/16 1024  . fentaNYL (SUBLIMAZE) 2,500 mcg in sodium chloride 0.9 % 250 mL (10 mcg/mL) infusion  25-400 mcg/hr Intravenous Continuous Jose Shirl Harris, MD 10 mL/hr at 02/11/16 1147 100 mcg/hr at 02/11/16 1147  . fentaNYL (SUBLIMAZE) bolus via infusion 50 mcg  50 mcg Intravenous Q1H PRN Jose Angelo A Corrie Dandy, MD      . fentaNYL (SUBLIMAZE) injection 100 mcg  100 mcg Intravenous Q15 min PRN Maryellen Pile, MD   100 mcg at 02/11/16 0057  . fentaNYL (SUBLIMAZE) injection 100 mcg  100 mcg Intravenous Q2H PRN Maryellen Pile, MD   100 mcg at 02/11/16 1100  . furosemide (LASIX) injection 40 mg  40 mg Intravenous Q12H [place], MD   40 mg at  02/11/16 1213  . furosemide (LASIX) injection 80 mg  80 mg Intravenous Once Maryellen Pile, MD   80 mg at 02/10/16 2330  . heparin ADULT infusion 100 units/mL (25000 units/250 mL)  900 Units/hr Intravenous Continuous Tyrone Apple, RPH 9 mL/hr at 02/11/16 1141 900 Units/hr at 02/11/16 1141  . insulin regular (NOVOLIN R,HUMULIN R) 250 Units in sodium chloride 0.9 % 250 mL (1 Units/mL) infusion   Intravenous Continuous Maryellen Pile, MD 4.5 mL/hr at 02/11/16 1213 4.5 Units/hr at 02/11/16 1213  . midazolam (VERSED) injection 2 mg  2 mg Intravenous Q15 min PRN Maryellen Pile, MD   2 mg at 02/11/16 1203  . midazolam (VERSED) injection 2 mg  2 mg Intravenous Q2H PRN Maryellen Pile, MD         No Known Allergies   Past Medical History  Diagnosis Date  . Diabetes mellitus   . Hypertension     Does not see a cardiologist  . Sleep apnea     Uses CPAP    Past Surgical History  Procedure Laterality Date  . Breast surgery      cyst right breast  . Anterior cervical decomp/discectomy fusion  08/28/2012    Procedure: ANTERIOR CERVICAL DECOMPRESSION/DISCECTOMY FUSION 2 LEVELS;  Surgeon: Otilio Connors, MD;  Location: [facility];  Service: Neurosurgery;  Laterality: N/A;  Cervical four-five, cervical six-seven Anterior cervical decompression/diskectomy/fusion/LifeNet Bone/Trestle plate    Family History  Problem Relation Age of Onset  . Heart disease Paternal Grandmother 51  . Diabetes Paternal Grandmother   . Hypertension Paternal Grandmother   . Heart disease Paternal Aunt   . Cancer Neg Hx   . Stroke Neg Hx   . Alcohol abuse Neg Hx   . Heart disease Father 39    MI    Social History:  reports that she has never smoked. She does not have any smokeless tobacco history on file. She reports that she drinks alcohol. She reports that she uses illicit drugs (Marijuana).   Review of Systems: Constitutional:  denies fever, chills, diaphoresis, appetite change and fatigue.  HEENT: denies  photophobia, eye pain, redness, hearing loss, ear pain, congestion, sore throat, rhinorrhea, sneezing, neck pain, neck stiffness and  tinnitus.  Respiratory: admits to SOB, DOE, cough, chest tightness, and wheezing.  Cardiovascular: denies chest pain, palpitations and leg swelling.  Gastrointestinal: denies nausea, vomiting, abdominal pain, diarrhea, constipation, blood in stool.  Genitourinary: denies dysuria, urgency, frequency, hematuria, flank pain and difficulty urinating.  Musculoskeletal: denies  myalgias, back pain, joint swelling, arthralgias and gait problem.   Skin: denies pallor, rash and wound.  Neurological: denies dizziness, seizures, syncope, weakness, light-headedness, numbness and headaches.   Hematological: denies adenopathy, easy bruising, personal or family bleeding history.  Psychiatric/ Behavioral: denies suicidal ideation, mood changes, confusion, nervousness, sleep disturbance and agitation.    Physical Exam: BP 163/77 mmHg  Pulse 100  Temp(Src) 99.3 F (37.4 C) (Oral)  Resp 27  Ht 5' 1"  (1.549 m)  Wt 242 lb 4.6 oz (109.9 kg)  BMI 45.80 kg/m2  SpO2 99%  Wt Readings from Last 3 Encounters:  02/11/16 242 lb 4.6 oz (109.9 kg)  04/27/14 240 lb (108.863 kg)  12/18/13 237 lb 8 oz (107.729 kg)    General: Vital signs reviewed and noted. Well-developed, well-nourished, in no acute distress; alert,   Head: Intubated, ETT in place   Neck: Supple. Negative for carotid bruits. No JVD   Lungs:  Vent   Heart: RRR with S1 S2. No murmurs, rubs, or gallops   Abdomen/ GI :  Morbid obesity   MSK: No deformities  Extremities: No clubbing or cyanosis. No edema.  Distal pedal pulses are 2+ and equal   Neurologic:  answers question s  Psych: Limited in our evaluation, patient is on the vent     Lab results: Basic Metabolic Panel:  Recent Labs Lab 02/10/16 1839 02/10/16 1841 02/11/16 0607 02/11/16 1125  NA 138 139 137  --   K 3.6 3.5 3.7  --   CL 100* 101 98*  --     CO2 19*  --  27  --   GLUCOSE 410* 406* 258*  --   BUN 9 10 10   --   CREATININE 1.28* 1.10* 1.16*  --   CALCIUM 9.2  --  9.0  --   MG  --   --   --  1.9  PHOS  --   --   --  3.7    Liver Function Tests: No results for input(s): AST, ALT, ALKPHOS, BILITOT, PROT, ALBUMIN in the last 168 hours. No results for input(s): LIPASE, AMYLASE in the last 168 hours. No results for input(s): AMMONIA in the last 168 hours.  CBC:  Recent Labs Lab 02/10/16 1839 02/10/16 1841 02/11/16 0607  WBC 22.4*  --  15.8*  NEUTROABS 14.2*  --   --   HGB 14.3 18.0* 12.0  HCT 47.2* 53.0* 39.7  MCV 81.2  --  79.1  PLT 297  --  191    Cardiac Enzymes:  Recent Labs Lab 02/10/16 2355 02/11/16 0607  TROPONINI 1.01* 7.20*    BNP: Invalid input(s): POCBNP  CBG:  Recent Labs Lab 02/11/16 0827 02/11/16 0918 02/11/16 1018 02/11/16 1114 02/11/16 1209  GLUCAP 205* 162* 153* 150* 149*    Coagulation Studies: No results for input(s): LABPROT, INR in the last 72 hours.   Other results:  Personal review of EKG shows :  - sinus tach,  NS ST changes    Imaging: Dg Chest Portable 1 View  02/10/2016  CLINICAL DATA:  Respiratory failure and status post intubation. EXAM: PORTABLE CHEST 1 VIEW COMPARISON:  11/16/2013 FINDINGS: Endotracheal tube present with the tip approximately 3  cm above the carina. Lungs show diffuse pulmonary edema bilaterally. There may be additional component of right lung pneumonia. No pneumothorax. No significant pleural fluid identified. The heart is mildly enlarged. IMPRESSION: Endotracheal tube tip approximately 3 cm above the carina. Diffuse pulmonary edema present. Potential additional component of right lung pneumonia. Electronically Signed   By: Aletta Edouard M.D.   On: 02/10/2016 19:02        Assessment & Plan:  1. Acute respiratory failure.  :  Multifactorial,  Has known HFpEF.  Has been noncompliant with meds and CPAP -  Still eats salt according to familiy  member in the [place]. \she will need to work on these issues.  Continue with diuresis . Repeat echo today  Continue supportive care  2. Diastolic CHF:  Echo in Jan. 2015 showed normal LV systlic function with grade 2 diastolic dysfunction .   Is noncompliant with meds.   3. Obesity   4. Obstructive sleep apnea - is noncompliant with her CPAP       Thayer Headings, Brooke Bonito., MD, [facility] 02/11/2016, 12:54 PM Office - [phone] Pager [phone]

## 2016-02-11 NOTE — Progress Notes (Signed)
  Echocardiogram 2D Echocardiogram has been performed.  Leta Jungling M 02/11/2016, 3:27 PM

## 2016-02-11 NOTE — Progress Notes (Addendum)
eLink Physician-Brief Progress Note Patient Name: [name] DOB: [birth year] MRN: [id number]   Date of Service  02/11/2016  HPI/Events of Note  Blood glucose = 149 >> 143 >> 148 >> 143 on Insulin IV infusion.   eICU Interventions  Will transition to Latus 10 units SQ now and Q day and Q 4 hour sensitive Novolog SSI.      Intervention Category Major Interventions: Hyperglycemia - active titration of insulin therapy  Lenell Antu 02/11/2016, 3:31 PM

## 2016-02-12 LAB — BASIC METABOLIC PANEL
ANION GAP: 13 (ref 5–15)
BUN: 10 mg/dL (ref 6–20)
CHLORIDE: 95 mmol/L — AB (ref 101–111)
CO2: 29 mmol/L (ref 22–32)
CREATININE: 1 mg/dL (ref 0.44–1.00)
Calcium: 8.9 mg/dL (ref 8.9–10.3)
GLUCOSE: 228 mg/dL — AB (ref 65–99)
Potassium: 3.6 mmol/L (ref 3.5–5.1)
Sodium: 137 mmol/L (ref 135–145)

## 2016-02-12 LAB — CBC
HEMATOCRIT: 39.3 % (ref 36.0–46.0)
HEMOGLOBIN: 12.2 g/dL (ref 12.0–15.0)
MCH: 24.7 pg — ABNORMAL LOW (ref 26.0–34.0)
MCHC: 31 g/dL (ref 30.0–36.0)
MCV: 79.7 fL (ref 78.0–100.0)
Platelets: 170 10*3/uL (ref 150–400)
RBC: 4.93 MIL/uL (ref 3.87–5.11)
RDW: 16.9 % — ABNORMAL HIGH (ref 11.5–15.5)
WBC: 15.6 10*3/uL — ABNORMAL HIGH (ref 4.0–10.5)

## 2016-02-12 LAB — TROPONIN I
TROPONIN I: 5.2 ng/mL — AB (ref <0.031)
TROPONIN I: 4.84 ng/mL — AB (ref <0.031)

## 2016-02-12 LAB — GLUCOSE, CAPILLARY
GLUCOSE-CAPILLARY: 195 mg/dL — AB (ref 65–99)
GLUCOSE-CAPILLARY: 224 mg/dL — AB (ref 65–99)
GLUCOSE-CAPILLARY: 202 mg/dL — AB (ref 65–99)
Glucose-Capillary: 173 mg/dL — ABNORMAL HIGH (ref 65–99)
Glucose-Capillary: 227 mg/dL — ABNORMAL HIGH (ref 65–99)
Glucose-Capillary: 303 mg/dL — ABNORMAL HIGH (ref 65–99)

## 2016-02-12 LAB — HEPARIN LEVEL (UNFRACTIONATED)
HEPARIN UNFRACTIONATED: 0.11 [IU]/mL — AB (ref 0.30–0.70)
HEPARIN UNFRACTIONATED: 0.26 [IU]/mL — AB (ref 0.30–0.70)
Heparin Unfractionated: <0.1 IU/mL — ABNORMAL LOW (ref 0.30–0.70)

## 2016-02-12 LAB — PHOSPHORUS: Phosphorus: 3.3 mg/dL (ref 2.5–4.6)

## 2016-02-12 LAB — MAGNESIUM: Magnesium: 1.7 mg/dL (ref 1.7–2.4)

## 2016-02-12 MED ORDER — POTASSIUM CHLORIDE 10 MEQ/100ML IV SOLN
10.0000 meq | Freq: Once | INTRAVENOUS | Status: AC
  Administered 2016-02-12: 10 meq via INTRAVENOUS
  Filled 2016-02-12: qty 100

## 2016-02-12 MED ORDER — HEPARIN BOLUS VIA INFUSION
2000.0000 [IU] | Freq: Once | INTRAVENOUS | Status: AC
  Administered 2016-02-12: 2000 [IU] via INTRAVENOUS
  Filled 2016-02-12: qty 2000

## 2016-02-12 MED ORDER — HEPARIN (PORCINE) IN NACL 100-0.45 UNIT/ML-% IJ SOLN
1700.0000 [IU]/h | INTRAMUSCULAR | Status: DC
  Filled 2016-02-12: qty 250

## 2016-02-12 MED ORDER — HEPARIN (PORCINE) IN NACL 100-0.45 UNIT/ML-% IJ SOLN
2100.0000 [IU]/h | INTRAMUSCULAR | Status: DC
  Administered 2016-02-12: 1900 [IU]/h via INTRAVENOUS
  Administered 2016-02-13 – 2016-02-14 (×3): 2100 [IU]/h via INTRAVENOUS
  Filled 2016-02-12 (×7): qty 250

## 2016-02-12 NOTE — Progress Notes (Signed)
PROGRESS NOTE   Primary Cardiologist:  Burt Knack ( hospital Consult Jan. 13, 2015)  Subjective:    50 y.o. female with a PMHx of HTN, HFpEF, DM, OAS , who was admitted to [facility] on 02/10/2016 for evaluation of respiratory failure  She has been noncompliant with her diet.  eats salty foods. Has been noncompliant with CPAP   Objective:    Vital Signs:   Temp:  [98.9 F (37.2 C)-99.9 F (37.7 C)] 99.9 F (37.7 C) (04/09 0819) Pulse Rate:  [72-107] 106 (04/09 1030) Resp:  [16-27] 20 (04/09 1030) BP: (95-183)/(49-123) 152/84 mmHg (04/09 1000) SpO2:  [94 %-100 %] 96 % (04/09 1030) FiO2 (%):  [40 %-50 %] 40 % (04/09 0854) Weight:  [238 lb 5.1 oz (108.1 kg)] 238 lb 5.1 oz (108.1 kg) (04/09 0500)      24-hour weight change: Weight change: -3 lb 15.5 oz (-1.8 kg)  Weight trends: Filed Weights   02/10/16 2335 02/11/16 0100 02/12/16 0500  Weight: 242 lb 4.6 oz (109.9 kg) 242 lb 4.6 oz (109.9 kg) 238 lb 5.1 oz (108.1 kg)    Intake/Output:  04/08 0701 - 04/09 0700 In: 1427.8 [I.V.:1027.8; IV Piggyback:400] Out: 2925 [Urine:2625; Emesis/NG output:300] Total I/O In: 39 [I.V.:39] Out: 150 [Urine:150]   Physical Exam: BP 152/84 mmHg  Pulse 106  Temp(Src) 99.9 F (37.7 C) (Oral)  Resp 20  Ht 5' 1"  (1.549 m)  Wt 238 lb 5.1 oz (108.1 kg)  BMI 45.05 kg/m2  SpO2 96%  Wt Readings from Last 3 Encounters:  02/12/16 238 lb 5.1 oz (108.1 kg)  04/27/14 240 lb (108.863 kg)  12/18/13 237 lb 8 oz (107.729 kg)    General: Middle age female,  Appears older than stated age  Head: Normocephalic, atraumatic.  Eyes: conjunctivae/corneas clear.  EOM's intact.   Throat: normal  Neck:  thick   Lungs:    on the vent   Heart:  RR   Abdomen:  Soft, non-tender, non-distended    Extremities: No edema   Neurologic: On vent sedated   Psych: NA , intubated     Labs: BMET:  Recent Labs  02/11/16 0607 02/11/16 1125 02/11/16 1758 02/12/16 0250  NA 137  --   --  137  K 3.7  --   3.3* 3.6  CL 98*  --   --  95*  CO2 27  --   --  29  GLUCOSE 258*  --   --  228*  BUN 10  --   --  10  CREATININE 1.16*  --   --  1.00  CALCIUM 9.0  --   --  8.9  MG  --  1.9  --  1.7  PHOS  --  3.7  --  3.3    Liver function tests: No results for input(s): AST, ALT, ALKPHOS, BILITOT, PROT, ALBUMIN in the last 72 hours. No results for input(s): LIPASE, AMYLASE in the last 72 hours.  CBC:  Recent Labs  02/10/16 1839  02/11/16 0607 02/12/16 0250  WBC 22.4*  --  15.8* 15.6*  NEUTROABS 14.2*  --   --   --   HGB 14.3  < > 12.0 12.2  HCT 47.2*  < > 39.7 39.3  MCV 81.2  --  79.1 79.7  PLT 297  --  191 170  < > = values in this interval not displayed.  Cardiac Enzymes:  Recent Labs  02/10/16 2355 02/11/16 0607  TROPONINI 1.01* 7.20*  Coagulation Studies: No results for input(s): LABPROT, INR in the last 72 hours.  Other: Invalid input(s): POCBNP No results for input(s): DDIMER in the last 72 hours. No results for input(s): HGBA1C in the last 72 hours.  Recent Labs  02/10/16 2355  TRIG 749*   No results for input(s): TSH, T4TOTAL, T3FREE, THYROIDAB in the last 72 hours.  Invalid input(s): FREET3 No results for input(s): VITAMINB12, FOLATE, FERRITIN, TIBC, IRON, RETICCTPCT in the last 72 hours.   Other results:  EKG  ( personally reviewed )   Medications:    Infusions: . fentaNYL infusion INTRAVENOUS 250 mcg/hr (02/11/16 2000)  . heparin 1,400 Units/hr (02/12/16 0800)    Scheduled Medications: . antiseptic oral rinse  7 mL Mouth Rinse QID  . aspirin  325 mg Oral Daily  . azithromycin  500 mg Intravenous Q24H  . cefTRIAXone (ROCEPHIN)  IV  2 g Intravenous Q24H  . chlorhexidine gluconate (SAGE KIT)  15 mL Mouth Rinse BID  . famotidine (PEPCID) IV  20 mg Intravenous Q12H  . furosemide  40 mg Intravenous Q12H  . furosemide  80 mg Intravenous Once  . insulin aspart  0-9 Units Subcutaneous 6 times per day  . insulin glargine  10 Units Subcutaneous QHS      Assessment/ Plan:   Active Problems:   Required emergent intubation   Acute respiratory failure (HCC)   Flash pulmonary edema (HCC)   NSTEMI (non-ST elevated myocardial infarction) ([place])  1.  Chronic diastolic CHF - recent echo shows normal LV systolic function with grade 2 diastolic dysfunction . She needs to take her meds regularly and needs to follow a low salt diet.   2. NSTEMI:  Likely  due to her respiratory failure and demand ischemia.   Echo did not show any specific wall motion abnormalities.  Consider possible cath later this week .  Will continue to trend troponins.   Disposition:  Length of Stay: 2  Ramond Dial., MD, [facility] 02/12/2016, 10:54 AM Office [phone] Pager [phone]

## 2016-02-12 NOTE — Progress Notes (Addendum)
ANTICOAGULATION CONSULT NOTE - FOLLOW UP  Pharmacy Consult:  Heparin Indication: chest pain/ACS  No Known Allergies  Patient Measurements: Height: 5\' 1"  (154.9 cm) Weight: 238 lb 5.1 oz (108.1 kg) IBW/kg (Calculated) : 47.8 Heparin Dosing Weight: 75 kg  Vital Signs: Temp: 98.9 F (37.2 C) (04/09 1245) Temp Source: Oral (04/09 1245) BP: 152/84 mmHg (04/09 1135) Pulse Rate: 95 (04/09 1200)  Labs:  Recent Labs  02/10/16 1839 02/10/16 1841 02/10/16 2355 02/11/16 0607 02/11/16 1758 02/12/16 0250  HGB 14.3 18.0*  --  12.0  --  12.2  HCT 47.2* 53.0*  --  39.7  --  39.3  PLT 297  --   --  191  --  170  HEPARINUNFRC  --   --   --   --  0.13* 0.11*  CREATININE 1.28* 1.10*  --  1.16*  --  1.00  TROPONINI  --   --  1.01* 7.20*  --   --     Estimated Creatinine Clearance: 78.1 mL/min (by C-G formula based on Cr of 1).     Assessment: 30 YOF admitted with respiratory distress from flash pulmonary edema.  Heparin initiated due to elevated troponin and EKG showing lateral ischemia.  Heparin level is undetectable.  RN reported no issue with infusion.   Goal of Therapy:  Heparin level 0.3-0.7 units/ml Monitor platelets by anticoagulation protocol: Yes    Plan:  - Rebolus with 2000 units IV x 1 - Increase heparin gtt to 1700 units/hr - Check 6 hr HL - Daily HL / CBC - Monitor TG (kept Q72H TG order although Propofol is off) - F/U nutrition, watch CBGs and vitals    Thuy D. Laney Potash, PharmD, BCPS Pager:  [phone] 02/12/2016, 2:00 PM   Addendum: Heparin level has increased but still remains below goal at 0.26. Increase rate to 1900 units/hr and follow up AM labs.  Louie Casa, PharmD, BCPS 02/12/2016, 8:44 PM

## 2016-02-12 NOTE — Progress Notes (Signed)
PULMONARY / CRITICAL CARE MEDICINE   Name: EMALEY APPLIN MRN: [id number] DOB: 01/14/1966    ADMISSION DATE:  02/10/2016 CONSULTATION DATE:  02/10/2016  REFERRING MD: EDP  CHIEF COMPLAINT:  Respiratory Distress  HISTORY OF PRESENT ILLNESS:   Ms. Jerkins is a 50 year old female with a past medical history of HFpEF, HTN, DM II, Obesity presenting with acute onset shortness of breath. Family reports the patient was doing well today with no complaints, went home tonight and around 6 PM she was found banging on the bathroom walls in respiratory distress. Reportedly patient was turning blue when she was found. Family reports she has had a viral illness for the past month with most of the family having the flu recently. Reports a history of non-compliance.   On arrival to the ED she was obtunded on CPAP from EMS. She was hypoxic and placed on BIPAP and started on a nitroglycerin infusion. Bedside ultrasound done in the ED showed no evidence of pericardial effuison and depressed left ventricular EF. IVC dynamics were consistent with volume overload. Patient had declining mental status and poor respiratory effort and required intubation. Her blood pressures were unable to tolerate both the propofol and nitroglycerin drip and was titrated off nitro ggt.  SUBJECTIVE:  Awake and alert. Mouthing words around tube. Denies pain, denies having chest pain previously or currently. She is requesting water.  VITAL SIGNS: BP 155/83 mmHg  Pulse 88  Temp(Src) 99.1 F (37.3 C) (Oral)  Resp 24  Ht  (1.549 m)  Wt 238 lb 5.1 oz (108.1 kg)  BMI 45.05 kg/m2  SpO2 98%  HEMODYNAMICS:    VENTILATOR SETTINGS: Vent Mode:  [-] PRVC FiO2 (%):  [40 %-50 %] 40 % Set Rate:  [24 bmp] 24 bmp Vt Set:  [380 mL] 380 mL PEEP:  [5 cmH20] 5 cmH20 Plateau Pressure:  [22 cmH20-27 cmH20] 27 cmH20  INTAKE / OUTPUT: I/O last 3 completed shifts: In: 2382.6 [I.V.:1622.6; Other:30; NG/GT:30; IV Piggyback:700] Out: 5375  [Urine:4775; Emesis/NG output:600]  PHYSICAL EXAMINATION: General: Morbidly obese female. Awake, RASS- 0 Neuro: Intubated, following commands. Cardiovascular: RRR, no murmurs rubs or gallops Lungs: mechanical breath sounds, no added sounds appreciated ant, auscultation Abdomen: Soft, obese, BS+ Skin: warm, dry  LABS:  BMET  Recent Labs Lab 02/10/16 1839 02/10/16 1841 02/11/16 0607 02/11/16 1758 02/12/16 0250  NA 138 139 137  --  137  K 3.6 3.5 3.7 3.3* 3.6  CL 100* 101 98*  --  95*  CO2 19*  --  27  --  29  BUN --  10  CREATININE 1.28* 1.10* 1.16*  --  1.00  GLUCOSE 410* 406* 258*  --  228*    Electrolytes  Recent Labs Lab 02/10/16 1839 02/11/16 0607 02/11/16 1125 02/12/16 0250  CALCIUM 9.2 9.0  --  8.9  MG  --   --  1.9 1.7  PHOS  --   --  3.7 3.3    CBC  Recent Labs Lab 02/10/16 1839 02/10/16 1841 02/11/16 0607 02/12/16 0250  WBC 22.4*  --  15.8* 15.6*  HGB 14.3 18.0* 12.0 12.2  HCT 47.2* 53.0* 39.7 39.3  PLT 297  --  191 170    Coag's No results for input(s): APTT, INR in the last 168 hours.  Sepsis Markers No results for input(s): LATICACIDVEN, PROCALCITON, O2SATVEN in the last 168 hours.  ABG  Recent Labs Lab 02/10/16 2032 02/11/16 0410  PHART 7.341* 7.418  PCO2ART 47.6* 44.4  PO2ART 96.0 94.0    Liver Enzymes No results for input(s): AST, ALT, ALKPHOS, BILITOT, ALBUMIN in the last 168 hours.  Cardiac Enzymes  Recent Labs Lab 02/10/16 2355 02/11/16 0607  TROPONINI 1.01* 7.20*    Glucose  Recent Labs Lab 02/11/16 1419 02/11/16 1514 02/11/16 1620 02/11/16 1953 02/12/16 0023 02/12/16 0445  GLUCAP 148* 143* 129* 200* 303* 195*    Imaging No results found. STUDIES:  02/10/2016 CXR: Diffuse pulmonary edema bilaterally, cardiomegaly and rotation limit film quality 02/11/2016 ECHO- EF- 55-60%. No Regional Wall motion abnormality. Grade 2 DD, IVC normal size, PA pressure Normal.  CULTURES: 02/10/2016 BCx x2 >>  Pending 02/10/2016 Trach aspirate >> Pending  ANTIBIOTICS: 02/10/2016 Ceftriaxone 2g daily >> 02/10/2016 Azithromycin 500 mg daily >>  SIGNIFICANT EVENTS:   LINES/TUBES: 02/10/2016 PIV left hand, right hand 02/10/2016 7.5 mm Endotracheal tube 02/10/2016 OG Tube  02/10/2016 Foley   DISCUSSION: 50 year old female with a history of HTN, G2 diastolic HF with a history of non-compliance presents with acute onset respiratory distress requiring intubation likely secondary to flash pulmonary edema with possible underlying pneumonia. Pt now on CAP coverage, and is being diuresed, weight down- 238 today, from 242 4/8, with stable Cr so far.  ASSESSMENT / PLAN:  PULMONARY A: Acute Hypoxemic hypercapneic Respiratory Failure Bilateral Pulmonary Edema Possible CAP P:   Suspect flash pulmonary edema; will tx with PPV and diuresis + BP management Maintain on PRVC for now, Vent bundle Diuresis Antibiotics  CARDIOVASCULAR A:  NSTEMI/demand ischemia, Trop -7 Flash pulmonary edema due to HTN HFpEF - ECHO 02/11/2016 EF 55-60%, G2 Diastolic Dysfunction P:  As above Cont diuresis-IV lasix 40mg  BID ECHO Pt with elevated troponin, cards consulted, appreciate recs, ? Need for intervention Trop today Cont heparin infusion- 48hrs.   RENAL A:   Pulm edema P:   Iv lasix 40mg  daily, ~3L out yesterday Follow electrolytes, mag daily  GASTROINTESTINAL A:   GI Ulcer PPx P:   Pepcid PO  HEMATOLOGIC A:   DVT PPx P:  Heparin drip for acs  INFECTIOUS A:   Leukocytosis Possible CAP P:   Ceftriaxone 2g daily Azithromycin 500 mg daily Follow up blood cultures and tracheal aspirate CBC, CMET in am Flu PCR  ENDOCRINE A:   DM II P:   Cont sensitive SSI Cont LAntus 10u daily  NEUROLOGIC A:   Intubated, RASS- 0 P:   Propofol ggt RASS goal: -1 On fentanyl gtt and Versed PRN  FAMILY  Updated on admission.  - Inter-disciplinary family meet or Palliative Care meeting due by:   02/17/2016  Inocente Salles, MD. PGY-3, IMTS. 8:30 AM 02/12/2016

## 2016-02-12 NOTE — Progress Notes (Signed)
ANTICOAGULATION CONSULT NOTE  Pharmacy Consult:  Heparin Indication: chest pain/ACS  No Known Allergies  Patient Measurements: Height: 5\' 1"  (154.9 cm) Weight: 242 lb 4.6 oz (109.9 kg) IBW/kg (Calculated) : 47.8 Heparin Dosing Weight: 75 kg  Vital Signs: Temp: 99.5 F (37.5 C) (04/09 0025) Temp Source: Oral (04/09 0025) BP: 158/83 mmHg (04/08 2023) Pulse Rate: 88 (04/08 2023)  Labs:  Recent Labs  02/10/16 1839 02/10/16 1841 02/10/16 2355 02/11/16 0607 02/11/16 1758 02/12/16 0250  HGB 14.3 18.0*  --  12.0  --  12.2  HCT 47.2* 53.0*  --  39.7  --  39.3  PLT 297  --   --  191  --  170  HEPARINUNFRC  --   --   --   --  0.13* 0.11*  CREATININE 1.28* 1.10*  --  1.16*  --   --   TROPONINI  --   --  1.01* 7.20*  --   --     Estimated Creatinine Clearance: 68 mL/min (by C-G formula based on Cr of 1.16).  Assessment: 49 YOF on heparin for ACS. Heparin level remains subtherapeutic on 1100 units/hr. CBC stable. No issues with line or bleeding reported per RN.  Goal of Therapy:  Heparin level 0.3-0.7 units/ml Monitor platelets by anticoagulation protocol: Yes   Plan:  Heparin 2000 units iv bolus x 1 Heparin drip to 1100 units / hr Follow up AM heparin level  Thank you Okey Regal, PharmD [phone]  02/12/2016, 3:59 AM

## 2016-02-13 ENCOUNTER — Inpatient Hospital Stay (HOSPITAL_COMMUNITY): Payer: BLUE CROSS/BLUE SHIELD

## 2016-02-13 DIAGNOSIS — R079 Chest pain, unspecified: Secondary | ICD-10-CM

## 2016-02-13 DIAGNOSIS — I110 Hypertensive heart disease with heart failure: Secondary | ICD-10-CM

## 2016-02-13 LAB — MAGNESIUM
MAGNESIUM: 1.8 mg/dL (ref 1.7–2.4)
MAGNESIUM: 1.8 mg/dL (ref 1.7–2.4)

## 2016-02-13 LAB — PHOSPHORUS
PHOSPHORUS: 3.1 mg/dL (ref 2.5–4.6)
Phosphorus: 2.4 mg/dL — ABNORMAL LOW (ref 2.5–4.6)

## 2016-02-13 LAB — COMPREHENSIVE METABOLIC PANEL
ALBUMIN: 3.1 g/dL — AB (ref 3.5–5.0)
ALT: 21 U/L (ref 14–54)
AST: 40 U/L (ref 15–41)
Alkaline Phosphatase: 68 U/L (ref 38–126)
Anion gap: 14 (ref 5–15)
BUN: 12 mg/dL (ref 6–20)
CHLORIDE: 94 mmol/L — AB (ref 101–111)
CO2: 28 mmol/L (ref 22–32)
CREATININE: 0.84 mg/dL (ref 0.44–1.00)
Calcium: 9.2 mg/dL (ref 8.9–10.3)
GFR calc Af Amer: >60 mL/min (ref >60)
GLUCOSE: 228 mg/dL — AB (ref 65–99)
POTASSIUM: 3.2 mmol/L — AB (ref 3.5–5.1)
Sodium: 136 mmol/L (ref 135–145)
Total Bilirubin: 1.5 mg/dL — ABNORMAL HIGH (ref 0.3–1.2)
Total Protein: 6.8 g/dL (ref 6.5–8.1)

## 2016-02-13 LAB — CULTURE, RESPIRATORY: SPECIAL REQUESTS: NORMAL

## 2016-02-13 LAB — CBC
HCT: 40 % (ref 36.0–46.0)
Hemoglobin: 12.1 g/dL (ref 12.0–15.0)
MCH: 23.9 pg — ABNORMAL LOW (ref 26.0–34.0)
MCHC: 30.3 g/dL (ref 30.0–36.0)
MCV: 78.9 fL (ref 78.0–100.0)
PLATELETS: 166 10*3/uL (ref 150–400)
RBC: 5.07 MIL/uL (ref 3.87–5.11)
RDW: 16.9 % — AB (ref 11.5–15.5)
WBC: 12.7 10*3/uL — AB (ref 4.0–10.5)

## 2016-02-13 LAB — GLUCOSE, CAPILLARY
GLUCOSE-CAPILLARY: 184 mg/dL — AB (ref 65–99)
GLUCOSE-CAPILLARY: 204 mg/dL — AB (ref 65–99)
GLUCOSE-CAPILLARY: 221 mg/dL — AB (ref 65–99)
GLUCOSE-CAPILLARY: 285 mg/dL — AB (ref 65–99)
Glucose-Capillary: 180 mg/dL — ABNORMAL HIGH (ref 65–99)
Glucose-Capillary: 359 mg/dL — ABNORMAL HIGH (ref 65–99)

## 2016-02-13 LAB — BASIC METABOLIC PANEL
Anion gap: 13 (ref 5–15)
BUN: 14 mg/dL (ref 6–20)
CALCIUM: 9.5 mg/dL (ref 8.9–10.3)
CHLORIDE: 94 mmol/L — AB (ref 101–111)
CO2: 29 mmol/L (ref 22–32)
CREATININE: 0.78 mg/dL (ref 0.44–1.00)
Glucose, Bld: 190 mg/dL — ABNORMAL HIGH (ref 65–99)
Potassium: 3.2 mmol/L — ABNORMAL LOW (ref 3.5–5.1)
SODIUM: 136 mmol/L (ref 135–145)

## 2016-02-13 LAB — HEPARIN LEVEL (UNFRACTIONATED)
HEPARIN UNFRACTIONATED: 0.24 [IU]/mL — AB (ref 0.30–0.70)
Heparin Unfractionated: 0.45 IU/mL (ref 0.30–0.70)
Heparin Unfractionated: 0.44 IU/mL (ref 0.30–0.70)

## 2016-02-13 LAB — TROPONIN I: TROPONIN I: 4.17 ng/mL — AB (ref <0.031)

## 2016-02-13 LAB — CULTURE, RESPIRATORY W GRAM STAIN: Culture: NORMAL

## 2016-02-13 LAB — ECHOCARDIOGRAM COMPLETE
Height: 61 in
WEIGHTICAEL: 3675.51 [oz_av]

## 2016-02-13 MED ORDER — HYDRALAZINE HCL 20 MG/ML IJ SOLN
INTRAMUSCULAR | Status: AC
  Filled 2016-02-13: qty 1

## 2016-02-13 MED ORDER — PERFLUTREN LIPID MICROSPHERE
1.0000 mL | INTRAVENOUS | Status: AC | PRN
  Administered 2016-02-13: 2 mL via INTRAVENOUS
  Filled 2016-02-13: qty 10

## 2016-02-13 MED ORDER — POTASSIUM CHLORIDE 20 MEQ PO PACK
40.0000 meq | PACK | ORAL | Status: DC
  Filled 2016-02-13: qty 2

## 2016-02-13 MED ORDER — CARVEDILOL 3.125 MG PO TABS
6.2500 mg | ORAL_TABLET | Freq: Two times a day (BID) | ORAL | Status: DC
  Administered 2016-02-13 – 2016-02-17 (×9): 6.25 mg via ORAL
  Filled 2016-02-13 (×2): qty 1
  Filled 2016-02-13: qty 2
  Filled 2016-02-13: qty 1
  Filled 2016-02-13: qty 2
  Filled 2016-02-13: qty 1
  Filled 2016-02-13: qty 2
  Filled 2016-02-13 (×2): qty 1
  Filled 2016-02-13: qty 2

## 2016-02-13 MED ORDER — POTASSIUM CHLORIDE CRYS ER 20 MEQ PO TBCR: 40.0000 meq | EXTENDED_RELEASE_TABLET | ORAL | Status: DC

## 2016-02-13 MED ORDER — HYDRALAZINE HCL 20 MG/ML IJ SOLN
10.0000 mg | Freq: Four times a day (QID) | INTRAMUSCULAR | Status: DC | PRN
  Administered 2016-02-13: 10 mg via INTRAVENOUS

## 2016-02-13 MED ORDER — LISINOPRIL 10 MG PO TABS
10.0000 mg | ORAL_TABLET | Freq: Every day | ORAL | Status: DC
  Administered 2016-02-13 – 2016-02-15 (×3): 10 mg via ORAL
  Filled 2016-02-13 (×3): qty 1

## 2016-02-13 MED ORDER — POTASSIUM CHLORIDE 20 MEQ/15ML (10%) PO SOLN
40.0000 meq | ORAL | Status: AC
  Administered 2016-02-13 (×2): 40 meq via ORAL
  Filled 2016-02-13 (×2): qty 30

## 2016-02-13 NOTE — Progress Notes (Signed)
ANTICOAGULATION CONSULT NOTE - Follow Up Consult  Pharmacy Consult for heparin Indication: NSTEMI  Labs:  Recent Labs  02/11/16 0607  02/12/16 0250 02/12/16 1242 02/12/16 2012 02/12/16 2352 02/13/16 0728  HGB 12.0  --  12.2  --   --  12.1  --   HCT 39.7  --  39.3  --   --  40.0  --   PLT 191  --  170  --   --  166  --   HEPARINUNFRC  --   < > 0.11* <0.10* 0.26* 0.24* 0.45  CREATININE 1.16*  --  1.00  --   --  0.84  --   TROPONINI 7.20*  --   --  5.20* 4.84* 4.17*  --   < > = values in this interval not displayed.   Assessment: 49 YOF on heparin for ACS/NSTEMI. HL therapeutic x 1. HL 0.45, hgb 12.1, plts 166.  Goal of Therapy:  Heparin level 0.3-0.7 units/ml   Plan:  Continue heparin gtt 2100 units/hr Confirmatory 6hr level Daily HL, CBC Monitor for S&S of bleed  Sandi Carne, PharmD Pharmacy Resident Pager: [phone]  02/13/2016,8:49 AM

## 2016-02-13 NOTE — Progress Notes (Signed)
Inpatient Diabetes Program Recommendations  AACE/ADA: New Consensus Statement on Inpatient Glycemic Control (2015)  Target Ranges:  Prepandial:   less than 140 mg/dL      Peak postprandial:   less than 180 mg/dL (1-2 hours)      Critically ill patients:  140 - 180 mg/dL   Review of Glycemic Control  Diabetes history: DM2 Outpatient Diabetes medications: metformin 500 bid Current orders for Inpatient glycemic control: Lantus 8 units QHS, Novolog sensitive Q4H  Results for KYRIA, SPADAFORA (MRN [id number]) as of 02/13/2016 12:30  Ref. Range 02/12/2016 20:01 02/12/2016 23:57 02/13/2016 04:17 02/13/2016 08:15 02/13/2016 11:46  Glucose-Capillary Latest Ref Range: 65-99 mg/dL 400 (H) 867 (H) 619 (H) 180 (H) 221 (H)    Inpatient Diabetes Program Recommendations:    HgbA1C to assess glycemic control prior to admission Increase Lantus to 10 units QHS Increase Novolog to moderate Q4H.  Will continue to follow. Thank you. Ailene Ards, RD, LDN, CDE Inpatient Diabetes Coordinator [phone]

## 2016-02-13 NOTE — Procedures (Signed)
Extubation Procedure Note  Patient Details:   Name: CALA CABERA DOB: 05/22/1966 MRN: [id number]   Airway Documentation:     Evaluation  O2 sats: stable throughout Complications: No apparent complications Patient did tolerate procedure well. Bilateral Breath Sounds: Clear   Yes   Pt. Was extubated to a 4L [place] without any complications, dyspnea or stridor noted. Pt. Was instructed on IS x 5, highest goal achieved was 1,065mL. Pt. Didn't have a cuff leak prior to extubation. CCM was made aware & ordered to continue with extubation.   Lakyn Mantione, Margaretmary Dys 02/13/2016, 12:19 PM

## 2016-02-13 NOTE — Progress Notes (Signed)
235 mls fentanyl drip wasted in sink. Verified by Noah Charon RN.

## 2016-02-13 NOTE — Progress Notes (Signed)
ANTICOAGULATION CONSULT NOTE - Follow Up Consult  Pharmacy Consult for heparin Indication: NSTEMI  Labs:  Recent Labs  02/10/16 1841  02/11/16 0607  02/12/16 0250 02/12/16 1242 02/12/16 2012 02/12/16 2352  HGB 18.0*  --  12.0  --  12.2  --   --  12.1  HCT 53.0*  --  39.7  --  39.3  --   --  40.0  PLT  --   --  191  --  170  --   --  166  HEPARINUNFRC  --   --   --   < > 0.11* <0.10* 0.26* 0.24*  CREATININE 1.10*  --  1.16*  --  1.00  --   --   --   TROPONINI  --   < > 7.20*  --   --  5.20* 4.84*  --   < > = values in this interval not displayed.   Assessment: 50yo female now with lower heparin level despite rate increase, lab drawn early but would expect level to increase.  Goal of Therapy:  Heparin level 0.3-0.7 units/ml   Plan:  Will increase heparin gtt by 2 units/kg/hr to 2100 units/hr and check level in am.  Vernard Gambles, PharmD, BCPS  02/13/2016,12:32 AM

## 2016-02-13 NOTE — Progress Notes (Signed)
ANTICOAGULATION CONSULT NOTE - Follow Up Consult  Pharmacy Consult for heparin Indication: NSTEMI  Labs:  Recent Labs  02/11/16 0607  02/12/16 0250 02/12/16 1242 02/12/16 2012 02/12/16 2352 02/13/16 0728 02/13/16 0740 02/13/16 1936  HGB 12.0  --  12.2  --   --  12.1  --   --   --   HCT 39.7  --  39.3  --   --  40.0  --   --   --   PLT 191  --  170  --   --  166  --   --   --   HEPARINUNFRC  --   < > 0.11* <0.10* 0.26* 0.24* 0.45  --  0.44  CREATININE 1.16*  --  1.00  --   --  0.84  --  0.78  --   TROPONINI 7.20*  --   --  5.20* 4.84* 4.17*  --   --   --   < > = values in this interval not displayed.  . heparin 2,100 Units/hr (02/13/16 2036)     Assessment: [name] on heparin for ACS/NSTEMI. HL therapeutic x 1. HL 0.45, hgb 12.1, plts 166.  PM follow-up: trouble obtaining afternoon heparin level, now therapeutic at 0.44.  Goal of Therapy:  Heparin level 0.3-0.7 units/ml   Plan:  Continue heparin gtt 2100 units/hr Daily HL, CBC Monitor for S&S of bleed  Tad Moore, BCPS  Clinical Pharmacist Pager [phone]  02/13/2016 9:35 PM

## 2016-02-13 NOTE — Progress Notes (Signed)
  Echocardiogram 2D Echocardiogram has been performed.  Sheralyn Boatman R 02/13/2016, 11:02 AM

## 2016-02-13 NOTE — Progress Notes (Signed)
PULMONARY / CRITICAL CARE MEDICINE   Name: [name] MRN: [id number] DOB: 07/23/1966    ADMISSION DATE:  02/10/2016 CONSULTATION DATE:  02/10/2016  REFERRING MD: EDP  CHIEF COMPLAINT:  Respiratory Distress  HISTORY OF PRESENT ILLNESS:   Ms. Pytlik is a 50 year old female with a past medical history of HFpEF, HTN, DM II, Obesity presenting with acute onset shortness of breath. Family reports the patient was doing well today with no complaints, went home tonight and around 6 PM she was found banging on the bathroom walls in respiratory distress. Reportedly patient was turning blue when she was found. Family reports she has had a viral illness for the past month with most of the family having the flu recently. Reports a history of non-compliance.   On arrival to the ED she was obtunded on CPAP from EMS. She was hypoxic and placed on BIPAP and started on a nitroglycerin infusion. Bedside ultrasound done in the ED showed no evidence of pericardial effuison and depressed left ventricular EF. IVC dynamics were consistent with volume overload. Patient had declining mental status and poor respiratory effort and required intubation. Her blood pressures were unable to tolerate both the propofol and nitroglycerin drip and was titrated off nitro ggt.  SUBJECTIVE:  Awake and alert. Mouthing words around tube. Denies pain, denies having chest pain previously or currently. She is requesting water.  VITAL SIGNS: BP 127/78 mmHg  Pulse 94  Temp(Src) 98 F (36.7 C) (Oral)  Resp 21  Ht 5\' 1"  (1.549 m)  Wt 229 lb 11.5 oz (104.2 kg)  BMI 43.43 kg/m2  SpO2 97%  LMP  (LMP Unknown)  HEMODYNAMICS:    VENTILATOR SETTINGS: Vent Mode:  [-] PSV;CPAP FiO2 (%):  [40 %] 40 % Set Rate:  [24 bmp] 24 bmp Vt Set:  [380 mL] 380 mL PEEP:  [5 cmH20] 5 cmH20 Pressure Support:  [10 cmH20-12 cmH20] 10 cmH20 Plateau Pressure:  [20 cmH20-23 cmH20] 23 cmH20  INTAKE / OUTPUT: I/O last 3 completed shifts: In: 2035  [I.V.:935; IV Piggyback:1100] Out: 6606 [YOKHT:9774; Emesis/NG output:50]  PHYSICAL EXAMINATION: General: Morbidly obese female. Awake, RASS- 0 Neuro: Intubated, following commands. Cardiovascular: RRR, no murmurs rubs or gallops Lungs: mechanical breath sounds, no added sounds appreciated ant, auscultation Abdomen: Soft, obese, BS+ Skin: warm, dry  LABS:  BMET  Recent Labs Lab 02/12/16 0250 02/12/16 2352 02/13/16 0740  NA 137 136 136  K 3.6 3.2* 3.2*  CL 95* 94* 94*  CO2 29 28 29   BUN 10 12 14   CREATININE 1.00 0.84 0.78  GLUCOSE 228* 228* 190*    Electrolytes  Recent Labs Lab 02/12/16 0250 02/12/16 2352 02/13/16 0740  CALCIUM 8.9 9.2 9.5  MG 1.7 1.8 1.8  PHOS 3.3 2.4* 3.1    CBC  Recent Labs Lab 02/11/16 0607 02/12/16 0250 02/12/16 2352  WBC 15.8* 15.6* 12.7*  HGB 12.0 12.2 12.1  HCT 39.7 39.3 40.0  PLT 191 170 166    Coag's No results for input(s): APTT, INR in the last 168 hours.  Sepsis Markers No results for input(s): LATICACIDVEN, PROCALCITON, O2SATVEN in the last 168 hours.  ABG  Recent Labs Lab 02/10/16 2032 02/11/16 0410  PHART 7.341* 7.418  PCO2ART 47.6* 44.4  PO2ART 96.0 94.0    Liver Enzymes  Recent Labs Lab 02/12/16 2352  AST 40  ALT 21  ALKPHOS 68  BILITOT 1.5*  ALBUMIN 3.1*    Cardiac Enzymes  Recent Labs Lab 02/12/16 1242 02/12/16 2012 02/12/16  2352  TROPONINI 5.20* 4.84* 4.17*    Glucose  Recent Labs Lab 02/12/16 1631 02/12/16 2001 02/12/16 2357 02/13/16 0417 02/13/16 0815 02/13/16 1146  GLUCAP 202* 173* 204* 184* 180* 221*    Imaging Dg Chest Port 1 View  02/13/2016  CLINICAL DATA:  Pulmonary edema. EXAM: PORTABLE CHEST 1 VIEW COMPARISON:  02/2016. FINDINGS: Endotracheal tube and NG tube in stable position. Cardiomegaly with pulmonary infiltrates with pulmonary edema. Interim partial clearing from prior exam. No significant pleural effusion. No pneumothorax. IMPRESSION: 1. Lines and tubes in  stable position. 2. Cardiomegaly with bilateral pulmonary infiltrates consistent with pulmonary edema. Partial clearing from prior exam. Electronically Signed   By: Maisie Fus  Register   On: 02/13/2016 07:02   STUDIES:  02/10/2016 CXR: Diffuse pulmonary edema bilaterally, cardiomegaly and rotation limit film quality 02/11/2016 ECHO- EF- 55-60%. No Regional Wall motion abnormality. Grade 2 DD, IVC normal size, PA pressure Normal.  CULTURES: 02/10/2016 BCx x2 >> Pending 02/10/2016 Trach aspirate >> Pending  ANTIBIOTICS: 02/10/2016 Ceftriaxone 2g daily >> 4/10 02/10/2016 Azithromycin 500 mg daily >>4/10  SIGNIFICANT EVENTS: 02/10/16 Intubated. 02/13/16 Extubated to Nasal Canula  LINES/TUBES: 02/10/2016 PIV left hand, right hand 02/10/2016 7.5 mm Endotracheal tube >>4/10 02/10/2016 OG Tube  02/10/2016 Foley   DISCUSSION: 50 year old female with a history of HTN, G2 diastolic HF with a history of non-compliance presents with acute onset respiratory distress requiring intubation likely secondary to flash pulmonary edema with possible underlying pneumonia. Pt now on CAP coverage, and is being diuresed, weight down- 229 today, from 242 4/8, with stable Cr so far. Tolerated wean- 4/10 then extubated. Also NSTEMI with trop peaked at 7. Cards consulted, possible Cath prior to discharge. ECHO with no regional wall motion abnormality.  ASSESSMENT / PLAN:  PULMONARY A: Acute Hypoxemic hypercapneic Respiratory Failure Bilateral Pulmonary Edema Possible CAP P:   Suspect flash pulmonary edema Tolerated wean, extubated to Nasal Canula. Diuresis cont Antibiotics d/c improved chest xray findings with diuresis Possibly transfer to Tele tomorrow if doing ok.  CARDIOVASCULAR A:  NSTEMI/demand ischemia, Trop - peaked at 7 Flash pulmonary edema due to HTN HFpEF - ECHO 02/11/2016 EF 55-60%, G2 Diastolic Dysfunction P:  Cont diuresis-IV lasix  BID ECHO repeated Pt with elevated troponin, cards consulted, appreciate  recs, probable cath before discharge Cont heparin infusion- 48hrs.- d/c 4/11 Not compliant with home meds- Restart home Bp meds- Coreg 6.25mg  BID, Lisinopril-  daily IV hydral  PRN for Bp >180  RENAL A:   Pulm edema Hypokalemia P:   Iv lasix  BID, Replete K- X2 doses Follow electrolytes, mag daily  GASTROINTESTINAL A:   GI Ulcer PPx P:   Pepcid PO Swallow eval, and then diet.  HEMATOLOGIC A:   DVT PPx leukocytosis P:  Heparin drip for acs d/c 4/11  INFECTIOUS A:   Leukocytosis Possible CAP P:   D/ c Ceftriaxone D/ c Azithromycin Follow up blood cultures and tracheal aspirate CBC, BET in am Flu PCR- neg  ENDOCRINE A:   DM II P:   Cont sensitive SSI Cont LAntus 10u daily  NEUROLOGIC A:   Intubated, RASS- 0 P:   D/c sedatives   FAMILY  Updated on admission.  - Inter-disciplinary family meet or Palliative Care meeting due by:  02/17/2016  Inocente Salles, MD. PGY-3, IMTS. 1:07 PM 02/13/2016

## 2016-02-13 NOTE — Progress Notes (Signed)
SUBJECTIVE:  Wide awake.  No SOB.  No chest pain   PHYSICAL EXAM Filed Vitals:   02/13/16 0800 02/13/16 0823 02/13/16 0925 02/13/16 1010  BP: 194/93  187/93 193/96  Pulse: 79  77 82  Temp:  98.5 F (36.9 C)    TempSrc:  Oral    Resp: 17  17 18   Height:      Weight:      SpO2: 100%  99% 99%   General:  No acute distress Lungs:  Clear Heart:  RRR Abdomen:  Positive bowel sounds, no rebound no guarding Extremities:  No edema Neuro:  Nonfocal  LABS: Lab Results  Component Value Date   TROPONINI 4.17* 02/12/2016   Results for orders placed or performed during the hospital encounter of 02/10/16 (from the past 24 hour(s))  Heparin level (unfractionated)     Status: Abnormal   Collection Time: 02/12/16 12:42 PM  Result Value Ref Range   Heparin Unfractionated <0.10 (L) 0.30 - 0.70 IU/mL  Troponin I (q 6hr x 3)     Status: Abnormal   Collection Time: 02/12/16 12:42 PM  Result Value Ref Range   Troponin I 5.20 (HH) <0.031 ng/mL  Glucose, capillary     Status: Abnormal   Collection Time: 02/12/16 12:43 PM  Result Value Ref Range   Glucose-Capillary 227 (H) 65 - 99 mg/dL  Glucose, capillary     Status: Abnormal   Collection Time: 02/12/16  4:31 PM  Result Value Ref Range   Glucose-Capillary 202 (H) 65 - 99 mg/dL  Glucose, capillary     Status: Abnormal   Collection Time: 02/12/16  8:01 PM  Result Value Ref Range   Glucose-Capillary 173 (H) 65 - 99 mg/dL   Comment 1 Notify RN   Troponin I (q 6hr x 3)     Status: Abnormal   Collection Time: 02/12/16  8:12 PM  Result Value Ref Range   Troponin I 4.84 (HH) <0.031 ng/mL  Heparin level (unfractionated)     Status: Abnormal   Collection Time: 02/12/16  8:12 PM  Result Value Ref Range   Heparin Unfractionated 0.26 (L) 0.30 - 0.70 IU/mL  Troponin I (q 6hr x 3)     Status: Abnormal   Collection Time: 02/12/16 11:52 PM  Result Value Ref Range   Troponin I 4.17 (HH) <0.031 ng/mL  Heparin level (unfractionated)     Status:  Abnormal   Collection Time: 02/12/16 11:52 PM  Result Value Ref Range   Heparin Unfractionated 0.24 (L) 0.30 - 0.70 IU/mL  CBC     Status: Abnormal   Collection Time: 02/12/16 11:52 PM  Result Value Ref Range   WBC 12.7 (H) 4.0 - 10.5 K/uL   RBC 5.07 3.87 - 5.11 MIL/uL   Hemoglobin 12.1 12.0 - 15.0 g/dL   HCT 16.1 09.6 - 04.5 %   MCV 78.9 78.0 - 100.0 fL   MCH 23.9 (L) 26.0 - 34.0 pg   MCHC 30.3 30.0 - 36.0 g/dL   RDW 40.9 (H) 81.1 - 91.4 %   Platelets 166 150 - 400 K/uL  Comprehensive metabolic panel     Status: Abnormal   Collection Time: 02/12/16 11:52 PM  Result Value Ref Range   Sodium 136 135 - 145 mmol/L   Potassium 3.2 (L) 3.5 - 5.1 mmol/L   Chloride 94 (L) 101 - 111 mmol/L   CO2 28 22 - 32 mmol/L   Glucose, Bld 228 (H) 65 - 99 mg/dL  BUN 12 6 - 20 mg/dL   Creatinine, Ser 4.33 0.44 - 1.00 mg/dL   Calcium 9.2 8.9 - 29.5 mg/dL   Total Protein 6.8 6.5 - 8.1 g/dL   Albumin 3.1 (L) 3.5 - 5.0 g/dL   AST 40 15 - 41 U/L   ALT 21 14 - 54 U/L   Alkaline Phosphatase 68 38 - 126 U/L   Total Bilirubin 1.5 (H) 0.3 - 1.2 mg/dL   GFR calc non Af Amer >60 >60 mL/min   GFR calc Af Amer >60 >60 mL/min   Anion gap 14 5 - 15  Magnesium     Status: None   Collection Time: 02/12/16 11:52 PM  Result Value Ref Range   Magnesium 1.8 1.7 - 2.4 mg/dL  Phosphorus     Status: Abnormal   Collection Time: 02/12/16 11:52 PM  Result Value Ref Range   Phosphorus 2.4 (L) 2.5 - 4.6 mg/dL  Glucose, capillary     Status: Abnormal   Collection Time: 02/12/16 11:57 PM  Result Value Ref Range   Glucose-Capillary 204 (H) 65 - 99 mg/dL   Comment 1 Notify RN   Glucose, capillary     Status: Abnormal   Collection Time: 02/13/16  4:17 AM  Result Value Ref Range   Glucose-Capillary 184 (H) 65 - 99 mg/dL   Comment 1 Notify RN   Heparin level (unfractionated)     Status: None   Collection Time: 02/13/16  7:28 AM  Result Value Ref Range   Heparin Unfractionated 0.45 0.30 - 0.70 IU/mL  Basic metabolic  panel     Status: Abnormal   Collection Time: 02/13/16  7:40 AM  Result Value Ref Range   Sodium 136 135 - 145 mmol/L   Potassium 3.2 (L) 3.5 - 5.1 mmol/L   Chloride 94 (L) 101 - 111 mmol/L   CO2 29 22 - 32 mmol/L   Glucose, Bld 190 (H) 65 - 99 mg/dL   BUN 14 6 - 20 mg/dL   Creatinine, Ser 1.88 0.44 - 1.00 mg/dL   Calcium 9.5 8.9 - 41.6 mg/dL   GFR calc non Af Amer >60 >60 mL/min   GFR calc Af Amer >60 >60 mL/min   Anion gap 13 5 - 15  Magnesium     Status: None   Collection Time: 02/13/16  7:40 AM  Result Value Ref Range   Magnesium 1.8 1.7 - 2.4 mg/dL  Phosphorus     Status: None   Collection Time: 02/13/16  7:40 AM  Result Value Ref Range   Phosphorus 3.1 2.5 - 4.6 mg/dL  Glucose, capillary     Status: Abnormal   Collection Time: 02/13/16  8:15 AM  Result Value Ref Range   Glucose-Capillary 180 (H) 65 - 99 mg/dL    Intake/Output Summary (Last 24 hours) at 02/13/16 1034 Last data filed at 02/13/16 0900  Gross per 24 hour  Intake    992 ml  Output   2800 ml  Net  -1808 ml     ASSESSMENT AND PLAN:  ACUTE ON CHRONIC DIASTOLIC HF:    Probably extubated today.  Good diuresis.   Echo is being performed.  The plan is dietary and med compliance and BP control.    Her EF appears to be moderately reduced.    NSTEMI:    Probable cath before discharge.    HTN:    BP is up and I will give IV hydralazine prior to extubation.  Restart Coreg and ACE  inhibitor after extubation when swallowing.    Fayrene Fearing [facility] 02/13/2016 10:34 AM

## 2016-02-14 LAB — BASIC METABOLIC PANEL
ANION GAP: 13 (ref 5–15)
BUN: 26 mg/dL — AB (ref 6–20)
CALCIUM: 9.5 mg/dL (ref 8.9–10.3)
CO2: 29 mmol/L (ref 22–32)
CREATININE: 1.48 mg/dL — AB (ref 0.44–1.00)
Chloride: 94 mmol/L — ABNORMAL LOW (ref 101–111)
GFR calc non Af Amer: 41 mL/min — ABNORMAL LOW (ref >60)
GFR, EST AFRICAN AMERICAN: 47 mL/min — AB (ref >60)
GLUCOSE: 192 mg/dL — AB (ref 65–99)
Potassium: 3.6 mmol/L (ref 3.5–5.1)
SODIUM: 136 mmol/L (ref 135–145)

## 2016-02-14 LAB — GLUCOSE, CAPILLARY
GLUCOSE-CAPILLARY: 223 mg/dL — AB (ref 65–99)
GLUCOSE-CAPILLARY: 171 mg/dL — AB (ref 65–99)
Glucose-Capillary: 162 mg/dL — ABNORMAL HIGH (ref 65–99)
Glucose-Capillary: 175 mg/dL — ABNORMAL HIGH (ref 65–99)
Glucose-Capillary: 208 mg/dL — ABNORMAL HIGH (ref 65–99)
Glucose-Capillary: 212 mg/dL — ABNORMAL HIGH (ref 65–99)

## 2016-02-14 LAB — HEPARIN LEVEL (UNFRACTIONATED): HEPARIN UNFRACTIONATED: 0.47 [IU]/mL (ref 0.30–0.70)

## 2016-02-14 LAB — CBC
HEMATOCRIT: 39.3 % (ref 36.0–46.0)
Hemoglobin: 11.9 g/dL — ABNORMAL LOW (ref 12.0–15.0)
MCH: 23.9 pg — ABNORMAL LOW (ref 26.0–34.0)
MCHC: 30.3 g/dL (ref 30.0–36.0)
MCV: 78.9 fL (ref 78.0–100.0)
PLATELETS: 231 10*3/uL (ref 150–400)
RBC: 4.98 MIL/uL (ref 3.87–5.11)
RDW: 17 % — AB (ref 11.5–15.5)
WBC: 15.4 10*3/uL — AB (ref 4.0–10.5)

## 2016-02-14 LAB — PATHOLOGIST SMEAR REVIEW

## 2016-02-14 MED ORDER — ACETAMINOPHEN 325 MG PO TABS
650.0000 mg | ORAL_TABLET | Freq: Four times a day (QID) | ORAL | Status: DC | PRN
  Administered 2016-02-14 – 2016-02-15 (×3): 650 mg via ORAL
  Filled 2016-02-14 (×3): qty 2

## 2016-02-14 MED ORDER — POTASSIUM CHLORIDE 20 MEQ PO PACK
40.0000 meq | PACK | Freq: Once | ORAL | Status: AC
  Administered 2016-02-14: 40 meq via ORAL
  Filled 2016-02-14: qty 2

## 2016-02-14 MED ORDER — ATORVASTATIN CALCIUM 40 MG PO TABS
40.0000 mg | ORAL_TABLET | Freq: Every day | ORAL | Status: DC
  Administered 2016-02-14 – 2016-02-16 (×3): 40 mg via ORAL
  Filled 2016-02-14 (×4): qty 1

## 2016-02-14 NOTE — Progress Notes (Signed)
PULMONARY / CRITICAL CARE MEDICINE   Name: [name] MRN: [id number] DOB: 08/20/1966    ADMISSION DATE:  02/10/2016 CONSULTATION DATE:  02/10/2016  REFERRING MD: EDP  CHIEF COMPLAINT:  Respiratory Distress  HISTORY OF PRESENT ILLNESS:   [name] is a 50 year old female with a past medical history of HFpEF, HTN, DM II, Obesity presenting with acute onset shortness of breath. Family reports the patient was doing well today with no complaints, went home tonight and around 6 PM she was found banging on the bathroom walls in respiratory distress. Reportedly patient was turning blue when she was found. Family reports she has had a viral illness for the past month with most of the family having the flu recently. Reports a history of non-compliance.   On arrival to the ED she was obtunded on CPAP from EMS. She was hypoxic and placed on BIPAP and started on a nitroglycerin infusion. Bedside ultrasound done in the ED showed no evidence of pericardial effuison and depressed left ventricular EF. IVC dynamics were consistent with volume overload. Patient had declining mental status and poor respiratory effort and required intubation. Her blood pressures were unable to tolerate both the propofol and nitroglycerin drip and was titrated off nitro ggt.  SUBJECTIVE:  Sleeping but arousable. No complaints today. No abdominal pain. Coughing up phlegm, ?colour. Breathing better. Almost back to her baseline.  VITAL SIGNS: BP 123/63 mmHg  Pulse 63  Temp(Src) 97.9 F (36.6 C) (Oral)  Resp 19  Ht  (1.549 m)  Wt 231 lb 14.8 oz (105.2 kg)  BMI 43.84 kg/m2  SpO2 93%  LMP  (LMP Unknown)  HEMODYNAMICS:    VENTILATOR SETTINGS: Vent Mode:  [-] PSV;CPAP FiO2 (%):  [40 %] 40 % PEEP:  [5 cmH20] 5 cmH20 Pressure Support:  [10 cmH20] 10 cmH20  INTAKE / OUTPUT: I/O last 3 completed shifts: In: 1856 [P.O.:360; I.V.:1096; IV Piggyback:400] Out: 2925 [Urine:2875; Emesis/NG output:50]  PHYSICAL  EXAMINATION: General: Morbidly obese female. Sleepy but arousable.  Neuro:On nasal canula, following commands. Cardiovascular: RRR, no murmurs rubs or gallops Lungs: Coarse breath sounds bilat, no added sounds appreciated ant auscultation Abdomen: Soft, obese, BS+, not tender. Skin: warm, dry, no rash  LABS:  BMET  Recent Labs Lab 02/12/16 2352 02/13/16 0740 02/14/16 0216  NA 136 136 136  K 3.2* 3.2* 3.6  CL 94* 94* 94*  CO2 BUN 12 14 26*  CREATININE 0.84 0.78 1.48*  GLUCOSE 228* 190* 192*    Electrolytes  Recent Labs Lab 02/12/16 0250 02/12/16 2352 02/13/16 0740 02/14/16 0216  CALCIUM 8.9 9.2 9.5 9.5  MG 1.7 1.8 1.8  --   PHOS 3.3 2.4* 3.1  --    CBC  Recent Labs Lab 02/12/16 0250 02/12/16 2352 02/14/16 0216  WBC 15.6* 12.7* 15.4*  HGB 12.2 12.1 11.9*  HCT 39.3 40.0 39.3  PLT 170 166 231   Coag's No results for input(s): APTT, INR in the last 168 hours.  Sepsis Markers No results for input(s): LATICACIDVEN, PROCALCITON, O2SATVEN in the last 168 hours.  ABG  Recent Labs Lab 02/10/16 2032 02/11/16 0410  PHART 7.341* 7.418  PCO2ART 47.6* 44.4  PO2ART 96.0 94.0    Liver Enzymes  Recent Labs Lab 02/12/16 2352  AST 40  ALT 21  ALKPHOS 68  BILITOT 1.5*  ALBUMIN 3.1*    Cardiac Enzymes  Recent Labs Lab 02/12/16 1242 02/12/16 2012 02/12/16 2352  TROPONINI 5.20* 4.84* 4.17*  Glucose  Recent Labs Lab 02/13/16 0815 02/13/16 1146 02/13/16 1526 02/13/16 2012 02/13/16 2333 02/14/16 0323  GLUCAP 180* 221* 359* 285* 212* 162*    Imaging No results found. STUDIES:  02/10/2016 CXR: Diffuse pulmonary edema bilaterally, cardiomegaly and rotation limit film quality 02/11/2016 ECHO- EF- 55-60%. No Regional Wall motion abnormality. Grade 2 DD, IVC normal size, PA pressure Normal. 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  CULTURES: 02/10/2016 BCx x2 >> Pending 02/10/2016 Trach aspirate >> Normal Oropharyngeal flora- Final.  ANTIBIOTICS: 02/10/2016 Ceftriaxone 2g daily >> 4/10 02/10/2016 Azithromycin 500 mg daily >>4/10  SIGNIFICANT EVENTS: 02/10/16 Intubated. 02/13/16 Extubated to Nasal Canula  LINES/TUBES: 02/10/2016 PIV left hand, right hand 02/10/2016 7.5 mm Endotracheal tube >>4/10 02/10/2016 OG Tube  02/10/2016 Foley   DISCUSSION: 50 year old female with a history of HTN, G2 diastolic HF with a history of non-compliance presents with acute onset respiratory distress requiring intubation likely secondary to flash pulmonary edema with possible underlying pneumonia. Pt now on CAP coverage, and is being diuresed, weight down- 229 today, from 242 4/8, with stable Cr so far. Tolerated wean- 4/10 then extubated. Also NSTEMI with trop peaked at 7. Cards consulted, possible Cath prior to discharge. ECHO with no regional wall motion abnormality.  ASSESSMENT / PLAN:  PULMONARY A: Acute Hypoxemic hypercapneic Respiratory Failure Bilateral Pulmonary Edema Possible CAP P:   Suspect flash pulmonary edema Tolerated wean, extubated to Nasal Canula. Diuresis cont Antibiotics d/c improved chest xray findings with diuresis Transfer to tele, [facility]  CARDIOVASCULAR A:  NSTEMI/demand ischemia, Trop - peaked at 7 Flash pulmonary edema due to HTN HFpEF - ECHO 02/11/2016 EF 55-60%, G2 Diastolic Dysfunction, EF- 4/10- reduced  Abnormal TTE, possible MV lesion / mass.  P:  D?C IV lasix 40mg  BID Cards recs appreciated recs, probable cath tomorrow, pending Cr. D/c heparin infusion- started 4/9 Cont home Bp meds- Coreg 6.25mg  BID, Lisinopril- 10mg  daily If worsening Cr tomorrow, consider d/c lisinopril am IV hydral 10mg  PRN for Bp >180 Will need TEE for MV mass- Cards eval- has difficult time with swallowing, higher risk for TEE, no plans for TEE at this  point.  RENAL A:   Pulm edema Hypokalemia AKI P:   D/c Iv lasix 40mg  BID, Consider holding lisinopril in future depending on trend Follow electrolytes, mag daily Replete k  GASTROINTESTINAL A:   GI Ulcer PPx P:   Pepcid PO Passed Swallow eval Advance diet  HEMATOLOGIC A:   DVT PPx leukocytosis P:  D/c Heparin drip for acs  INFECTIOUS A:   Leukocytosis Possible CAP on Chest xray P:   D/ c Ceftriaxone and Azithromycin Follow up blood cultures,  Trach aspirate- normal flora CBC, BET in am Flu PCR- neg If worsening leukocytosis/ or fevers consider restart CAP coverage  ENDOCRINE A:   DM II P:   Cont sensitive SSI Cont LAntus 10u daily  NEUROLOGIC A:   Intubated, RASS- 0 P:   D/c sedatives  TRANSFER TO TELE TODAY. [facility] TO TAKE OVER CARE- 02/15/2016.  FAMILY  Updated 4/10.  - Inter-disciplinary family meet or Palliative Care meeting due by:  02/17/2016  Inocente Salles, MD. PGY-3, IMTS. 7:30 AM 02/14/2016

## 2016-02-14 NOTE — Progress Notes (Signed)
1300 transferred in 3w via wheelchair . Fully awake alert and oriented x3 family in attendance orientation to room and  unit .done

## 2016-02-14 NOTE — Progress Notes (Signed)
ANTICOAGULATION CONSULT NOTE - Follow Up Consult  Pharmacy Consult for heparin Indication: NSTEMI  Labs:  Recent Labs  02/12/16 0250 02/12/16 1242 02/12/16 2012 02/12/16 2352 02/13/16 0728 02/13/16 0740 02/13/16 1936 02/14/16 0216  HGB 12.2  --   --  12.1  --   --   --  11.9*  HCT 39.3  --   --  40.0  --   --   --  39.3  PLT 170  --   --  166  --   --   --  231  HEPARINUNFRC 0.11* <0.10* 0.26* 0.24* 0.45  --  0.44 0.47  CREATININE 1.00  --   --  0.84  --  0.78  --  1.48*  TROPONINI  --  5.20* 4.84* 4.17*  --   --   --   --      Assessment: 49 YOF on heparin for ACS/NSTEMI. No note of bleed. HLs have been therapeutic. HL 0.47, hgb 11.9, plts 231.   Goal of Therapy:  Heparin level 0.3-0.7 units/ml   Plan:  Continue heparin gtt 2100 units/hr Daily HL, CBC Monitor for S&S of bleed  Sandi Carne, PharmD Pharmacy Resident Pager: [phone]  02/14/2016,7:07 AM

## 2016-02-14 NOTE — Progress Notes (Signed)
Patient requested documentation for daughter and son regarding work. CSW provided documentation to patient. No further needs were reported at this time.   Fernande Boyden, LCSWA Clinical Social Worker [facility] Ph: [phone]

## 2016-02-14 NOTE — Care Management Note (Signed)
Case Management Note  Patient Details  Name: BRITANY WALKUP MRN: [id number] Date of Birth: [birth year]  Subjective/Objective:       Independent prior to admission.  Extubated and ambulating around room without assistance.  For discharge needs call [facility] at [phone] ext [phone]             Action/Plan:   Expected Discharge Date:                  Expected Discharge Plan:  Home/Self Care  In-House Referral:     Discharge planning Services     Post Acute Care Choice:    Choice offered to:     DME Arranged:    DME Agency:     HH Arranged:    HH Agency:     Status of Service:  In process, will continue to follow  Medicare Important Message Given:    Date Medicare IM Given:    Medicare IM give by:    Date Additional Medicare IM Given:    Additional Medicare Important Message give by:     If discussed at Long Length of Stay Meetings, dates discussed:    Additional Comments:  Vangie Bicker, RN 02/14/2016, 2:28 PM

## 2016-02-14 NOTE — Progress Notes (Signed)
Inpatient Diabetes Program Recommendations  AACE/ADA: New Consensus Statement on Inpatient Glycemic Control (2015)  Target Ranges:  Prepandial:   less than 140 mg/dL      Peak postprandial:   less than 180 mg/dL (1-2 hours)      Critically ill patients:  140 - 180 mg/dL   Review of Glycemic Control Results for [name], [name] (MRN [id number]) as of 02/14/2016 13:16  Ref. Range 02/13/2016 20:12 02/13/2016 23:33 02/14/2016 03:23 02/14/2016 08:02 02/14/2016 11:57  Glucose-Capillary Latest Ref Range: 65-99 mg/dL 440 (H) 102 (H) 725 (H) 175 (H) 223 (H)   Diabetes history: DM2 Outpatient Diabetes medications: metformin 500 bid (Has not been taking for a while) Current orders for Inpatient glycemic control: Lantus 10 units QHS, Novolog sensitive Q4H  Inpatient Diabetes Program Recommendations:  Please consider A1c and increase in Lantus to 20 units q hs.  Thank you, Billy Fischer. Lajuan Kovaleski, RN, MSN, CDE Inpatient Glycemic Control Team Team Pager [phone] (8am-5pm) 02/14/2016 1:20 PM

## 2016-02-14 NOTE — Progress Notes (Signed)
SUBJECTIVE:  Wide awake.  No SOB.  No chest pain   PHYSICAL EXAM Filed Vitals:   02/14/16 0500 02/14/16 0600 02/14/16 0700 02/14/16 0800  BP: 113/56 123/63 113/65 122/70  Pulse: 77 63 61 57  Temp:      TempSrc:      Resp: 19 19 18 20   Height:      Weight:      SpO2: 94% 93% 98% 98%   General:  No acute distress Lungs:  Clear Heart:  RRR Abdomen:  Positive bowel sounds, no rebound no guarding Extremities:  No edema Neuro:  Nonfocal  LABS: Lab Results  Component Value Date   TROPONINI 4.17* 02/12/2016   Results for orders placed or performed during the hospital encounter of 02/10/16 (from the past 24 hour(s))  Glucose, capillary     Status: Abnormal   Collection Time: 02/13/16 11:46 AM  Result Value Ref Range   Glucose-Capillary 221 (H) 65 - 99 mg/dL  Glucose, capillary     Status: Abnormal   Collection Time: 02/13/16  3:26 PM  Result Value Ref Range   Glucose-Capillary 359 (H) 65 - 99 mg/dL  Heparin level (unfractionated)     Status: None   Collection Time: 02/13/16  7:36 PM  Result Value Ref Range   Heparin Unfractionated 0.44 0.30 - 0.70 IU/mL  Glucose, capillary     Status: Abnormal   Collection Time: 02/13/16  8:12 PM  Result Value Ref Range   Glucose-Capillary 285 (H) 65 - 99 mg/dL  Glucose, capillary     Status: Abnormal   Collection Time: 02/13/16 11:33 PM  Result Value Ref Range   Glucose-Capillary 212 (H) 65 - 99 mg/dL  Heparin level (unfractionated)     Status: None   Collection Time: 02/14/16  2:16 AM  Result Value Ref Range   Heparin Unfractionated 0.47 0.30 - 0.70 IU/mL  CBC     Status: Abnormal   Collection Time: 02/14/16  2:16 AM  Result Value Ref Range   WBC 15.4 (H) 4.0 - 10.5 K/uL   RBC 4.98 3.87 - 5.11 MIL/uL   Hemoglobin 11.9 (L) 12.0 - 15.0 g/dL   HCT 97.4 16.3 - 84.5 %   MCV 78.9 78.0 - 100.0 fL   MCH 23.9 (L) 26.0 - 34.0 pg   MCHC 30.3 30.0 - 36.0 g/dL   RDW 36.4 (H) 68.0 - 32.1 %   Platelets 231 150 - 400 K/uL  Basic  metabolic panel     Status: Abnormal   Collection Time: 02/14/16  2:16 AM  Result Value Ref Range   Sodium 136 135 - 145 mmol/L   Potassium 3.6 3.5 - 5.1 mmol/L   Chloride 94 (L) 101 - 111 mmol/L   CO2 29 22 - 32 mmol/L   Glucose, Bld 192 (H) 65 - 99 mg/dL   BUN 26 (H) 6 - 20 mg/dL   Creatinine, Ser 2.24 (H) 0.44 - 1.00 mg/dL   Calcium 9.5 8.9 - 82.5 mg/dL   GFR calc non Af Amer 41 (L) >60 mL/min   GFR calc Af Amer 47 (L) >60 mL/min   Anion gap 13 5 - 15  Glucose, capillary     Status: Abnormal   Collection Time: 02/14/16  3:23 AM  Result Value Ref Range   Glucose-Capillary 162 (H) 65 - 99 mg/dL  Glucose, capillary     Status: Abnormal   Collection Time: 02/14/16  8:02 AM  Result Value Ref Range   Glucose-Capillary  175 (H) 65 - 99 mg/dL    Intake/Output Summary (Last 24 hours) at 02/14/16 0956 Last data filed at 02/14/16 0900  Gross per 24 hour  Intake   1104 ml  Output   1575 ml  Net   -471 ml    ECHO - Compared to a recent echo on 02/10/2016, the LVEF is lower at  40-45% with apical and inferoapical akinesis. No mural thrombus  seen with Definity. There is a questionable prolapsing mass on  the mitral valve, but only seen in the 2 chamber view, trivial  associated AI - this may be artifact or possibly thrombus.  Recommend further evalution by TEE to exclude thrombus given  reduced LV function and apical wall motion abnormality.   ASSESSMENT AND PLAN:  ACUTE ON CHRONIC DIASTOLIC HF:    Extubated to nasal cannula.    NSTEMI:    Cath possibly tomorrow depending on the creat which is up with diuresis.  I will keep her NPO after MN and reassess in the AM.   HTN:    BP OK now.    CM:  EF is mildly reduced.  Question of thrombus or mass on the mitral.  However, she has a difficult time swallowing and might be higher risk for TEE.  No plans for TEE at this point.   Fayrene Fearing [facility] 02/14/2016 9:56 AM

## 2016-02-15 ENCOUNTER — Encounter (HOSPITAL_COMMUNITY)
Admission: EM | Disposition: A | Discharge status: Home / Self Care | Payer: Self-pay | Source: Home / Self Care | Attending: Internal Medicine

## 2016-02-15 ENCOUNTER — Encounter (HOSPITAL_COMMUNITY): Payer: Self-pay | Admitting: General Practice

## 2016-02-15 DIAGNOSIS — N189 Chronic kidney disease, unspecified: Secondary | ICD-10-CM

## 2016-02-15 DIAGNOSIS — I110 Hypertensive heart disease with heart failure: Secondary | ICD-10-CM | POA: Insufficient documentation

## 2016-02-15 DIAGNOSIS — I2510 Atherosclerotic heart disease of native coronary artery without angina pectoris: Secondary | ICD-10-CM

## 2016-02-15 HISTORY — PX: CARDIAC CATHETERIZATION: SHX172

## 2016-02-15 LAB — GLUCOSE, CAPILLARY
GLUCOSE-CAPILLARY: 208 mg/dL — AB (ref 65–99)
GLUCOSE-CAPILLARY: 188 mg/dL — AB (ref 65–99)
GLUCOSE-CAPILLARY: 172 mg/dL — AB (ref 65–99)
GLUCOSE-CAPILLARY: 150 mg/dL — AB (ref 65–99)
Glucose-Capillary: 186 mg/dL — ABNORMAL HIGH (ref 65–99)
Glucose-Capillary: 178 mg/dL — ABNORMAL HIGH (ref 65–99)

## 2016-02-15 LAB — COMPREHENSIVE METABOLIC PANEL
ALBUMIN: 2.8 g/dL — AB (ref 3.5–5.0)
ALK PHOS: 60 U/L (ref 38–126)
ALT: 20 U/L (ref 14–54)
ANION GAP: 12 (ref 5–15)
AST: 28 U/L (ref 15–41)
BILIRUBIN TOTAL: 0.7 mg/dL (ref 0.3–1.2)
BUN: 31 mg/dL — AB (ref 6–20)
CALCIUM: 9.8 mg/dL (ref 8.9–10.3)
CO2: 26 mmol/L (ref 22–32)
CREATININE: 1.02 mg/dL — AB (ref 0.44–1.00)
Chloride: 97 mmol/L — ABNORMAL LOW (ref 101–111)
GFR calc Af Amer: >60 mL/min (ref >60)
GFR calc non Af Amer: >60 mL/min (ref >60)
GLUCOSE: 172 mg/dL — AB (ref 65–99)
Potassium: 3.8 mmol/L (ref 3.5–5.1)
Sodium: 135 mmol/L (ref 135–145)
TOTAL PROTEIN: 6.1 g/dL — AB (ref 6.5–8.1)

## 2016-02-15 LAB — PREGNANCY, URINE: Preg Test, Ur: NEGATIVE

## 2016-02-15 LAB — CBC
HCT: 40 % (ref 36.0–46.0)
Hemoglobin: 12.4 g/dL (ref 12.0–15.0)
MCH: 24 pg — AB (ref 26.0–34.0)
MCHC: 31 g/dL (ref 30.0–36.0)
MCV: 77.4 fL — AB (ref 78.0–100.0)
PLATELETS: 195 10*3/uL (ref 150–400)
RBC: 5.17 MIL/uL — AB (ref 3.87–5.11)
RDW: 16.5 % — AB (ref 11.5–15.5)
WBC: 9 10*3/uL (ref 4.0–10.5)

## 2016-02-15 LAB — CREATININE, SERUM
CREATININE: 0.89 mg/dL (ref 0.44–1.00)
GFR calc Af Amer: >60 mL/min (ref >60)
GFR calc non Af Amer: >60 mL/min (ref >60)

## 2016-02-15 LAB — HEPARIN LEVEL (UNFRACTIONATED)

## 2016-02-15 LAB — POCT ACTIVATED CLOTTING TIME: Activated Clotting Time: 533 seconds

## 2016-02-15 LAB — PROTIME-INR
INR: 1.07 (ref 0.00–1.49)
PROTHROMBIN TIME: 14.1 s (ref 11.6–15.2)

## 2016-02-15 SURGERY — LEFT HEART CATH AND CORONARY ANGIOGRAPHY

## 2016-02-15 MED ORDER — HEART ATTACK BOUNCING BOOK
Freq: Once | Status: AC
  Administered 2016-02-15: 17:00:00
  Filled 2016-02-15: qty 1

## 2016-02-15 MED ORDER — ENOXAPARIN SODIUM 40 MG/0.4ML ~~LOC~~ SOLN
40.0000 mg | SUBCUTANEOUS | Status: DC
  Administered 2016-02-16 – 2016-02-17 (×2): 40 mg via SUBCUTANEOUS
  Filled 2016-02-15 (×2): qty 0.4

## 2016-02-15 MED ORDER — BIVALIRUDIN BOLUS VIA INFUSION - CUPID
INTRAVENOUS | Status: DC | PRN
  Administered 2016-02-15: 78.75 mg via INTRAVENOUS

## 2016-02-15 MED ORDER — HEPARIN (PORCINE) IN NACL 2-0.9 UNIT/ML-% IJ SOLN
INTRAMUSCULAR | Status: DC | PRN
  Administered 2016-02-15: 17:00:00

## 2016-02-15 MED ORDER — VERAPAMIL HCL 2.5 MG/ML IV SOLN
INTRAVENOUS | Status: DC | PRN
  Administered 2016-02-15: 10 mL via INTRA_ARTERIAL

## 2016-02-15 MED ORDER — TICAGRELOR 90 MG PO TABS
ORAL_TABLET | ORAL | Status: AC
  Filled 2016-02-15: qty 2

## 2016-02-15 MED ORDER — IOPAMIDOL (ISOVUE-370) INJECTION 76%
INTRAVENOUS | Status: DC | PRN
  Administered 2016-02-15: 205 mL

## 2016-02-15 MED ORDER — SODIUM CHLORIDE 0.9% FLUSH
3.0000 mL | Freq: Two times a day (BID) | INTRAVENOUS | Status: DC
  Administered 2016-02-15 – 2016-02-16 (×3): 3 mL via INTRAVENOUS

## 2016-02-15 MED ORDER — IOPAMIDOL (ISOVUE-370) INJECTION 76%
INTRAVENOUS | Status: AC
  Filled 2016-02-15: qty 100

## 2016-02-15 MED ORDER — TICAGRELOR 90 MG PO TABS
ORAL_TABLET | ORAL | Status: DC | PRN
  Administered 2016-02-15: 180 mg via ORAL

## 2016-02-15 MED ORDER — IOPAMIDOL (ISOVUE-370) INJECTION 76%
INTRAVENOUS | Status: AC
  Filled 2016-02-15: qty 125

## 2016-02-15 MED ORDER — VERAPAMIL HCL 2.5 MG/ML IV SOLN
INTRAVENOUS | Status: AC
Start: 2016-02-15 — End: 2016-02-15
  Filled 2016-02-15: qty 2

## 2016-02-15 MED ORDER — SODIUM CHLORIDE 0.9 % IV SOLN
250.0000 mg | INTRAVENOUS | Status: DC | PRN
  Administered 2016-02-15: 1.75 mg/kg/h via INTRAVENOUS

## 2016-02-15 MED ORDER — SODIUM CHLORIDE 0.9% FLUSH: 3.0000 mL | INTRAVENOUS | Status: DC | PRN

## 2016-02-15 MED ORDER — FENTANYL CITRATE (PF) 100 MCG/2ML IJ SOLN
INTRAMUSCULAR | Status: AC
  Filled 2016-02-15: qty 2

## 2016-02-15 MED ORDER — SODIUM CHLORIDE 0.9 % IV SOLN: INTRAVENOUS | Status: AC

## 2016-02-15 MED ORDER — ASPIRIN 81 MG PO CHEW
81.0000 mg | CHEWABLE_TABLET | Freq: Every day | ORAL | Status: DC
  Administered 2016-02-16 – 2016-02-17 (×2): 81 mg via ORAL
  Filled 2016-02-15 (×2): qty 1

## 2016-02-15 MED ORDER — MIDAZOLAM HCL 2 MG/2ML IJ SOLN
INTRAMUSCULAR | Status: AC
  Filled 2016-02-15: qty 2

## 2016-02-15 MED ORDER — LIVING BETTER WITH HEART FAILURE BOOK
Freq: Once | Status: AC
  Administered 2016-02-15: 17:00:00

## 2016-02-15 MED ORDER — SODIUM CHLORIDE 0.9 % IV SOLN: 250.0000 mL | INTRAVENOUS | Status: DC | PRN

## 2016-02-15 MED ORDER — LIDOCAINE HCL (PF) 1 % IJ SOLN
INTRAMUSCULAR | Status: AC
  Filled 2016-02-15: qty 30

## 2016-02-15 MED ORDER — NITROGLYCERIN 1 MG/10 ML FOR IR/CATH LAB
INTRA_ARTERIAL | Status: AC
  Filled 2016-02-15: qty 10

## 2016-02-15 MED ORDER — HEPARIN SODIUM (PORCINE) 1000 UNIT/ML IJ SOLN
INTRAMUSCULAR | Status: AC
  Filled 2016-02-15: qty 1

## 2016-02-15 MED ORDER — ANGIOPLASTY BOOK
Freq: Once | Status: AC
  Administered 2016-02-15: 17:00:00
  Filled 2016-02-15: qty 1

## 2016-02-15 MED ORDER — LIDOCAINE HCL (PF) 1 % IJ SOLN
INTRAMUSCULAR | Status: DC | PRN
  Administered 2016-02-15: 15 mL
  Administered 2016-02-15: 5 mL

## 2016-02-15 MED ORDER — ASPIRIN 81 MG PO CHEW: 81.0000 mg | CHEWABLE_TABLET | Freq: Every day | ORAL | Status: DC

## 2016-02-15 MED ORDER — LIVING WELL WITH DIABETES BOOK
Freq: Once | Status: AC
  Administered 2016-02-15: 17:00:00
  Filled 2016-02-15: qty 1

## 2016-02-15 MED ORDER — NITROGLYCERIN 1 MG/10 ML FOR IR/CATH LAB
INTRA_ARTERIAL | Status: DC | PRN
  Administered 2016-02-15: 200 ug via INTRACORONARY

## 2016-02-15 MED ORDER — BIVALIRUDIN 250 MG IV SOLR
INTRAVENOUS | Status: AC
  Filled 2016-02-15: qty 250

## 2016-02-15 MED ORDER — HEPARIN (PORCINE) IN NACL 2-0.9 UNIT/ML-% IJ SOLN
INTRAMUSCULAR | Status: AC
  Filled 2016-02-15: qty 1500

## 2016-02-15 MED ORDER — FENTANYL CITRATE (PF) 100 MCG/2ML IJ SOLN
INTRAMUSCULAR | Status: DC | PRN
Start: 2016-02-15 — End: 2016-02-15
  Administered 2016-02-15 (×2): 25 ug via INTRAVENOUS

## 2016-02-15 MED ORDER — MIDAZOLAM HCL 2 MG/2ML IJ SOLN
INTRAMUSCULAR | Status: DC | PRN
  Administered 2016-02-15 (×2): 1 mg via INTRAVENOUS

## 2016-02-15 MED ORDER — TICAGRELOR 90 MG PO TABS
90.0000 mg | ORAL_TABLET | Freq: Two times a day (BID) | ORAL | Status: DC
  Administered 2016-02-16: 90 mg via ORAL
  Filled 2016-02-15: qty 1

## 2016-02-15 SURGICAL SUPPLY — 25 items
BALLN EMERGE MR 2.0X12 (BALLOONS) ×3
BALLN EMERGE MR 2.5X12 (BALLOONS) ×3
BALLN ~~LOC~~ TREK RX 2.5X20 (BALLOONS) ×3
BALLOON EMERGE MR 2.0X12 (BALLOONS) IMPLANT
BALLOON EMERGE MR 2.5X12 (BALLOONS) IMPLANT
BALLOON ~~LOC~~ TREK RX 2.5X20 (BALLOONS) IMPLANT
CATH INFINITI 5 FR AR1 MOD (CATHETERS) ×2 IMPLANT
CATH INFINITI 5FR MULTPACK ANG (CATHETERS) ×2 IMPLANT
CATH OPTITORQUE JACKY 4.0 5F (CATHETERS) ×2 IMPLANT
CATH VISTA GUIDE 6FR XBLAD4 (CATHETERS) ×2 IMPLANT
DEVICE CONTINUOUS FLUSH (MISCELLANEOUS) ×2 IMPLANT
DEVICE RAD COMP TR BAND LRG (VASCULAR PRODUCTS) ×2 IMPLANT
GLIDESHEATH SLEND SS 6F .021 (SHEATH) ×2 IMPLANT
KIT ENCORE 26 ADVANTAGE (KITS) ×2 IMPLANT
KIT HEART LEFT (KITS) ×3 IMPLANT
PACK CARDIAC CATHETERIZATION (CUSTOM PROCEDURE TRAY) ×3 IMPLANT
SHEATH PINNACLE 5F 10CM (SHEATH) ×2 IMPLANT
SHEATH PINNACLE 6F 10CM (SHEATH) ×2 IMPLANT
STENT RESOLUTE INTEG 2.25X26 (Permanent Stent) ×2 IMPLANT
TRANSDUCER W/STOPCOCK (MISCELLANEOUS) ×3 IMPLANT
TUBING CIL FLEX 10 FLL-RA (TUBING) ×3 IMPLANT
WIRE EMERALD 3MM-J .035X150CM (WIRE) ×2 IMPLANT
WIRE HI TORQ VERSACORE-J 145CM (WIRE) ×2 IMPLANT
WIRE RUNTHROUGH .014X180CM (WIRE) ×2 IMPLANT
WIRE SAFE-T 1.5MM-J .035X260CM (WIRE) ×2 IMPLANT

## 2016-02-15 NOTE — Progress Notes (Signed)
Patient voids a little in bedpan upon arrival to floor at 1800. Stated that she had a foley cath in earlier. Has not voided post foley cath removal. Bladder scan performed. 800cc of urine residual noted in bladder post bladder scan. Suzzette Righter Np informed and gave order for I&O cath. Patient is eating at this time and states "I want to wait until after I finish eating for the cath." No s/s of distress noted or complaints voiced at this time. Call bell is in reach and bed is in lowest position. Family is at bedside.

## 2016-02-15 NOTE — Interval H&P Note (Signed)
Cath Lab Visit (complete for each Cath Lab visit)  Clinical Evaluation Leading to the Procedure:   ACS: Yes.    Non-ACS:    Anginal Classification: CCS IV  Anti-ischemic medical therapy: Minimal Therapy (1 class of medications)  Non-Invasive Test Results: No non-invasive testing performed  Prior CABG: No previous CABG      History and Physical Interval Note:  02/15/2016 2:33 PM  [name]  has presented today for surgery, with the diagnosis of cp  The various methods of treatment have been discussed with the patient and family. After consideration of risks, benefits and other options for treatment, the patient has consented to  Procedure(s): Left Heart Cath and Coronary Angiography (N/A) as a surgical intervention .  The patient's history has been reviewed, patient examined, no change in status, stable for surgery.  I have reviewed the patient's chart and labs.  Questions were answered to the patient's satisfaction.     Lorine Bears

## 2016-02-15 NOTE — H&P (View-Only) (Signed)
SUBJECTIVE:    No SOB.  No chest pain   PHYSICAL EXAM Filed Vitals:   02/14/16 1200 02/14/16 1314 02/14/16 2008 02/15/16 0530  BP: 134/87 122/59 123/71 146/77  Pulse: 70 72 78 62  Temp:  98.3 F (36.8 C) 98.9 F (37.2 C) 98.3 F (36.8 C)  TempSrc:  Oral Oral Oral  Resp: Height:   (1.6 m)    Weight:  229 lb 8 oz (104.101 kg)  231 lb 6.4 oz (104.962 kg)  SpO2: 100% 96% 95% 95%   General:  No acute distress Lungs:  Clear Heart:  RRR Abdomen:  Positive bowel sounds, no rebound no guarding Extremities:  No edema   LABS: Lab Results  Component Value Date   TROPONINI 4.17* 02/12/2016   Results for orders placed or performed during the hospital encounter of 02/10/16 (from the past 24 hour(s))  Glucose, capillary     Status: Abnormal   Collection Time: 02/14/16 11:57 AM  Result Value Ref Range   Glucose-Capillary 223 (H) 65 - 99 mg/dL  Glucose, capillary     Status: Abnormal   Collection Time: 02/14/16  4:10 PM  Result Value Ref Range   Glucose-Capillary 171 (H) 65 - 99 mg/dL   Comment 1 Notify RN   Glucose, capillary     Status: Abnormal   Collection Time: 02/14/16  9:01 PM  Result Value Ref Range   Glucose-Capillary 208 (H) 65 - 99 mg/dL   Comment 1 Notify RN    Comment 2 Document in Chart   Glucose, capillary     Status: Abnormal   Collection Time: 02/15/16  1:46 AM  Result Value Ref Range   Glucose-Capillary 208 (H) 65 - 99 mg/dL   Comment 1 Notify RN    Comment 2 Document in Chart   Glucose, capillary     Status: Abnormal   Collection Time: 02/15/16  5:28 AM  Result Value Ref Range   Glucose-Capillary 186 (H) 65 - 99 mg/dL  Glucose, capillary     Status: Abnormal   Collection Time: 02/15/16  7:56 AM  Result Value Ref Range   Glucose-Capillary 188 (H) 65 - 99 mg/dL   Comment 1 Notify RN    Comment 2 Document in Chart     Intake/Output Summary (Last 24 hours) at 02/15/16 0912 Last data filed at 02/15/16 0900  Gross per 24 hour    Intake  576.5 ml  Output    500 ml  Net   76.5 ml    ECHO - Compared to a recent echo on 02/10/2016, the LVEF is lower at  40-45% with apical and inferoapical akinesis. No mural thrombus  seen with Definity. There is a questionable prolapsing mass on  the mitral valve, but only seen in the 2 chamber view, trivial  associated AI - this may be artifact or possibly thrombus.  Recommend further evalution by TEE to exclude thrombus given  reduced LV function and apical wall motion abnormality.   ASSESSMENT AND PLAN:  ACUTE ON CHRONIC DIASTOLIC HF:    Breathing well.  Euvolemic  NSTEMI:    Cath today.  The patient understands that risks included but are not limited to stroke (1 in 1000), death (1 in 1000), kidney failure [usually temporary] (1 in 500), bleeding (1 in 200), allergic reaction [possibly serious] (1 in 200).  The patient understands and agrees to proceed.    HTN:    BP is high.  For now I will continue current meds but will consider further med prior to discharge.   CM:  EF is mildly reduced.  Question of thrombus or mass on the mitral.  However, she has a difficult time swallowing and might be higher risk for TEE.  No plans for TEE at this point.   CKD:  Creat is improved.    Fayrene Fearing [facility] 02/15/2016 9:12 AM

## 2016-02-15 NOTE — Progress Notes (Signed)
CM CONSULT FOR MEDICATION ASSISTANCE  Patient has private insurance with BCBS; she is for heart cath today. CM to talk to patient post cath to help problem solve issues with medication.  Member: [name], [name] [[id number]]  Plan: BCBS OTHER [[id number]] Payor: [facility]*    Coverage Information    Coverage information:     Subscriber: [id number] [name], [name]     Rel to sub: 01 - Self     Member ID: [id number]     Payor: [id number]     Benefit plan: [id number] OTHER Ph: [phone]     Group number: [id number]     Member effective dates: from 09/05/14

## 2016-02-15 NOTE — Progress Notes (Signed)
Initial Nutrition Assessment  DOCUMENTATION CODES:   Morbid obesity  INTERVENTION:  -Continue to monitor nutritional needs  NUTRITION DIAGNOSIS:   Inadequate oral intake related to poor appetite as evidenced by per patient/family report.  GOAL:   Patient will meet greater than or equal to 90% of their needs  MONITOR:   PO intake, Labs, Weight trends, Skin, I & O's  ASSESSMENT:   50 year old female with a past medical history of HFpEF, HTN, DM II, Obesity presenting with acute onset shortness of breath. Family reports the patient was doing well today with no complaints, went home tonight and around 6 PM she was found banging on the bathroom walls in respiratory distress. Reportedly patient was turning blue when she was found. Family reports she has had a viral illness for the past month with most of the family having the flu recently. Reports a history of non-compliance.   Pt seen for follow up. Pt BMI categorized as obese. Pt extubated 4/10. Pt placed on CL diet and then advanced to heart healthy diet on 4/11. Pt currently NPO for a heart cath. Pt expected to be advanced back to regular diet this afternoon.   Pt reports eating 25% of meals. Per chart review, pt ate 100% of lunch on 4/10 and dinner on 4/11. Pt reports a poor appetite but no N/V or abdominal discomfort. Pt refuses nutritional supplements. Based on follow up, pt does not need further nutritional interventions. Will continue to follow up and monitor intake. If intake decreases, will provide nutritional supplements.   Labs reviewed; Cl 97 mmol/L, BUN 31 mg/dl, creat 9.24 mg/dl, CBGs 268-341 mg/dl.   Meds reviewed;   Diet Order:  Diet NPO time specified  Skin:  Reviewed, no issues  Last BM:  4/11  Height:   Ht Readings from Last 1 Encounters:  02/14/16 5\' 3"  (1.6 m)    Weight:   Wt Readings from Last 1 Encounters:  02/15/16 231 lb 6.4 oz (104.962 kg)    Ideal Body Weight:  47.73 kg  BMI:  Body mass  index is 41 kg/(m^2).  Estimated Nutritional Needs:   Kcal:  1550-1750 (15-17 kcals/kg)  Protein:  80-90 g(0.8 g/kg)  Fluid:  Per MD  EDUCATION NEEDS:   No education needs identified at this time  Beryle Quant, MS [facility] Dietetic Intern Pager [phone]

## 2016-02-15 NOTE — Progress Notes (Signed)
  SUBJECTIVE:    No SOB.  No chest pain   PHYSICAL EXAM Filed Vitals:   02/14/16 1200 02/14/16 1314 02/14/16 2008 02/15/16 0530  BP: 134/87 122/59 123/71 146/77  Pulse: 70 72 78 62  Temp:  98.3 F (36.8 C) 98.9 F (37.2 C) 98.3 F (36.8 C)  TempSrc:  Oral Oral Oral  Resp: 16 18 18 18  Height:  5' 3" (1.6 m)    Weight:  229 lb 8 oz (104.101 kg)  231 lb 6.4 oz (104.962 kg)  SpO2: 100% 96% 95% 95%   General:  No acute distress Lungs:  Clear Heart:  RRR Abdomen:  Positive bowel sounds, no rebound no guarding Extremities:  No edema   LABS: Lab Results  Component Value Date   TROPONINI 4.17* 02/12/2016   Results for orders placed or performed during the hospital encounter of 02/10/16 (from the past 24 hour(s))  Glucose, capillary     Status: Abnormal   Collection Time: 02/14/16 11:57 AM  Result Value Ref Range   Glucose-Capillary 223 (H) 65 - 99 mg/dL  Glucose, capillary     Status: Abnormal   Collection Time: 02/14/16  4:10 PM  Result Value Ref Range   Glucose-Capillary 171 (H) 65 - 99 mg/dL   Comment 1 Notify RN   Glucose, capillary     Status: Abnormal   Collection Time: 02/14/16  9:01 PM  Result Value Ref Range   Glucose-Capillary 208 (H) 65 - 99 mg/dL   Comment 1 Notify RN    Comment 2 Document in Chart   Glucose, capillary     Status: Abnormal   Collection Time: 02/15/16  1:46 AM  Result Value Ref Range   Glucose-Capillary 208 (H) 65 - 99 mg/dL   Comment 1 Notify RN    Comment 2 Document in Chart   Glucose, capillary     Status: Abnormal   Collection Time: 02/15/16  5:28 AM  Result Value Ref Range   Glucose-Capillary 186 (H) 65 - 99 mg/dL  Glucose, capillary     Status: Abnormal   Collection Time: 02/15/16  7:56 AM  Result Value Ref Range   Glucose-Capillary 188 (H) 65 - 99 mg/dL   Comment 1 Notify RN    Comment 2 Document in Chart     Intake/Output Summary (Last 24 hours) at 02/15/16 0912 Last data filed at 02/15/16 0900  Gross per 24 hour    Intake  576.5 ml  Output    500 ml  Net   76.5 ml    ECHO - Compared to a recent echo on 02/10/2016, the LVEF is lower at  40-45% with apical and inferoapical akinesis. No mural thrombus  seen with Definity. There is a questionable prolapsing mass on  the mitral valve, but only seen in the 2 chamber view, trivial  associated AI - this may be artifact or possibly thrombus.  Recommend further evalution by TEE to exclude thrombus given  reduced LV function and apical wall motion abnormality.   ASSESSMENT AND PLAN:  ACUTE ON CHRONIC DIASTOLIC HF:    Breathing well.  Euvolemic  NSTEMI:    Cath today.  The patient understands that risks included but are not limited to stroke (1 in 1000), death (1 in 1000), kidney failure [usually temporary] (1 in 500), bleeding (1 in 200), allergic reaction [possibly serious] (1 in 200).  The patient understands and agrees to proceed.    HTN:    BP is high.     For now I will continue current meds but will consider further med prior to discharge.   CM:  EF is mildly reduced.  Question of thrombus or mass on the mitral.  However, she has a difficult time swallowing and might be higher risk for TEE.  No plans for TEE at this point.   CKD:  Creat is improved.    Fayrene Fearing [facility] 02/15/2016 9:12 AM

## 2016-02-15 NOTE — Progress Notes (Signed)
Site area: right groin  Site Prior to Removal:  Level 0  Pressure Applied For 30 MINUTES    Minutes Beginning at 1805  Manual:   Yes.    Patient Status During Pull:  Patient remained Alert and Oriented by four. Small hematoma formed, relieved quickly with applied pressure.   Post Pull Groin Site:  Level 0  Post Pull Instructions Given:  Yes.    Post Pull Pulses Present:  Yes.    Dressing Applied:  Yes.    Comments:  Pressure dressing applied. C/D/I. Patient tolerated well. Educated on post sheath removal and bedrest. No hematoma noted. No bleeding noted. Call bell is in reach and bed is in lowest position.

## 2016-02-15 NOTE — Progress Notes (Signed)
[facility] PROGRESS NOTE  [name] VWU:[id number] DOB: [birth year] DOA: 02/10/2016 PCP: Uvaldo Rising, MD  Length of stay: 5  [facility] TOOK OVER- 02/15/2016.    Assessment/Plan: Active Problems:   Required emergent intubation   Acute respiratory failure (HCC)   Flash pulmonary edema (HCC)   NSTEMI (non-ST elevated myocardial infarction) (HCC)   HISTORY OF PRESENT ILLNESS:  [name] is a 50 year old female with a past medical history of HFpEF, HTN, DM II, Obesity presenting with acute onset shortness of breath. Family reports the patient was doing well today with no complaints, went home tonight and around 6 PM she was found banging on the bathroom walls in respiratory distress. Reportedly patient was turning blue when she was found. Family reports she has had a viral illness for the past month with most of the family having the flu recently. Reports a history of non-compliance.   On arrival to the ED she was obtunded on CPAP from EMS. She was hypoxic and placed on BIPAP and started on a nitroglycerin infusion. Bedside ultrasound done in the ED showed no evidence of pericardial effuison and depressed left ventricular EF. IVC dynamics were consistent with volume overload. Patient had declining mental status and acute onset respiratory distress requiring intubation likely secondary to flash pulmonary edema with possible underlying pneumonia.. Tolerated wean- 4/10 then extubated. Also NSTEMI with trop peaked at 7. Cards consulted, possible Cath prior to discharge. ECHO with no regional wall motion abnormality.   Assessment and plan  Acute Hypoxemic hypercapneic Respiratory Failure Bilateral Pulmonary Edema Possible CAP Suspect flash pulmonary edema Tolerated wean, extubated to Nasal Canula on 4/10. Continue diuresis Antibiotics d/c improved chest xray findings with diuresis Continue telemetry.    NSTEMI/demand ischemia, Trop - peaked at 7 Flash pulmonary  edema due to HTN HFpEF - ECHO 02/11/2016 EF 55-60%, G2 Diastolic Dysfunction, EF- 4/10- reduced  Abnormal TTE, possible MV lesion / mass.   Discontinued IV lasix 40mg  BID in anticipation for cardiac cath, BMP pending this morning Cardiac cath today, pending Cr. D/c heparin infusion- started 4/9 Cont home Bp meds- Coreg 6.25mg  BID, Lisinopril- 10mg  daily If worsening Cr tomorrow, consider d/c lisinopril am IV hydral 10mg  PRN for Bp >180 Will need TEE for MV mass- Cards eval- has difficult time with swallowing, higher risk for TEE, no plans for TEE at this point. Question of thrombus or mass on the mitral. However, she has a difficult time swallowing and might be higher risk for TEE. No plans for TEE at this point.    Pulm edema Hypokalemia AKI D/c Iv lasix 40mg  BID, Follow renal function, may need to reinitiate Lasix post cath if renal function is stable    GI Ulcer PPx Pepcid PO Passed Swallow eval Advance diet     Leukocytosis Possible CAP on Chest xray D/ c Ceftriaxone and Azithromycin -4/7-4/10 Follow up blood cultures,  Trach aspirate- normal flora CBC, BET in am Flu PCR- neg If worsening leukocytosis/ or fevers consider restart CAP coverage      Diabetes mellitus- metformin on hold, check hemoglobin A1c Cont sensitive SSI Cont LAntus 10u daily      DVT prophylaxsis  Lovenox post cardiac cath  Code Status:      Code Status Orders        Start     Ordered   02/11/16 0000  Full code   Continuous     02/10/16 2359     Family  Communication: Discussed in detail with the patient's daughter and patient , all imaging results, lab results explained to the patient   Disposition Plan:  As per cardiology     Consultants:  Critical care  Cardiology    STUDIES:  02/10/2016 CXR: Diffuse pulmonary edema bilaterally, cardiomegaly and rotation limit film quality 02/11/2016 ECHO- EF- 55-60%. No Regional Wall motion abnormality. Grade 2 DD, IVC normal size,  PA pressure Normal. 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  CULTURES: 02/10/2016 BCx x2 >> Pending 02/10/2016 Trach aspirate >> Normal Oropharyngeal flora- Final.  ANTIBIOTICS: 02/10/2016 Ceftriaxone 2g daily >> 4/10 02/10/2016 Azithromycin 500 mg daily >>4/10  SIGNIFICANT EVENTS: 02/10/16 Intubated. 02/13/16 Extubated to Nasal Canula  LINES/TUBES: 02/10/2016 PIV left hand, right hand 02/10/2016 7.5 mm Endotracheal tube >>4/10 02/10/2016 OG Tube  02/10/2016 Foley   Antibiotics: Anti-infectives    Start     Dose/Rate Route Frequency Ordered Stop   02/10/16 2145  cefTRIAXone (ROCEPHIN) 2 g in dextrose 5 % 50 mL IVPB  Status:  Discontinued     2 g 100 mL/hr over 30 Minutes Intravenous Every 24 hours 02/10/16 2131 02/13/16 1122   02/10/16 2145  azithromycin (ZITHROMAX) 500 mg in dextrose 5 % 250 mL IVPB  Status:  Discontinued     500 mg 250 mL/hr over 60 Minutes Intravenous Every 24 hours 02/10/16 2131 02/13/16 1122         HPI/Subjective: No SOB. No chest pain  Objective: Filed Vitals:   02/14/16 1200 02/14/16 1314 02/14/16 2008 02/15/16 0530  BP: 134/87 122/59 123/71 146/77  Pulse: 70 72 78 62  Temp:  98.3 F (36.8 C) 98.9 F (37.2 C) 98.3 F (36.8 C)  TempSrc:  Oral Oral Oral  Resp: 16 18 18 18   Height:  5\' 3"  (1.6 m)    Weight:  104.101 kg (229 lb 8 oz)  104.962 kg (231 lb 6.4 oz)  SpO2: 100% 96% 95% 95%    Intake/Output Summary (Last 24 hours) at 02/15/16 2440 Last data filed at 02/14/16 2232  Gross per 24 hour  Intake  607.5 ml  Output    500 ml  Net  107.5 ml    Exam:  General: Morbidly obese female. Sleepy but arousable.  Neuro:On nasal canula, following commands. Cardiovascular: RRR, no murmurs rubs or gallops Lungs: Coarse breath sounds bilat, no added sounds appreciated ant auscultation Abdomen: Soft, obese, BS+, not tender. Skin: warm, dry, no  rash  Data Review   Micro Results Recent Results (from the past 240 hour(s))  Culture, respiratory (NON-Expectorated)     Status: None   Collection Time: 02/10/16 11:46 PM  Result Value Ref Range Status   Specimen Description NASOPHARYNGEAL  Final   Special Requests Normal  Final   Gram Stain   Final    FEW WBC PRESENT,BOTH PMN AND MONONUCLEAR NO SQUAMOUS EPITHELIAL CELLS SEEN FEW GRAM POSITIVE COCCI IN PAIRS Performed at [facility]    Culture   Final    NORMAL OROPHARYNGEAL FLORA Performed at [facility]    Report Status 02/13/2016 FINAL  Final  MRSA PCR Screening     Status: None   Collection Time: 02/10/16 11:46 PM  Result Value Ref Range Status   MRSA by PCR NEGATIVE NEGATIVE Final    Comment:        The GeneXpert MRSA Assay (FDA approved for NASAL specimens only), is one component of a  comprehensive MRSA colonization surveillance program. It is not intended to diagnose MRSA infection nor to guide or monitor treatment for MRSA infections.   Culture, blood (routine x 2)     Status: None (Preliminary result)   Collection Time: 02/10/16 11:55 PM  Result Value Ref Range Status   Specimen Description BLOOD RIGHT HAND  Final   Special Requests IN PEDIATRIC BOTTLE  Final   Culture NO GROWTH 3 DAYS  Final   Report Status PENDING  Incomplete  Culture, blood (routine x 2)     Status: None (Preliminary result)   Collection Time: 02/11/16 12:05 AM  Result Value Ref Range Status   Specimen Description BLOOD LEFT HAND  Final   Special Requests IN PEDIATRIC BOTTLE  Final   Culture NO GROWTH 3 DAYS  Final   Report Status PENDING  Incomplete    Radiology Reports Dg Chest Port 1 View  02/13/2016  CLINICAL DATA:  Pulmonary edema. EXAM: PORTABLE CHEST 1 VIEW COMPARISON:  02/2016. FINDINGS: Endotracheal tube and NG tube in stable position. Cardiomegaly with pulmonary infiltrates with pulmonary edema. Interim partial clearing from prior exam. No  significant pleural effusion. No pneumothorax. IMPRESSION: 1. Lines and tubes in stable position. 2. Cardiomegaly with bilateral pulmonary infiltrates consistent with pulmonary edema. Partial clearing from prior exam. Electronically Signed   By: Maisie Fus  Register   On: 02/13/2016 07:02   Dg Chest Portable 1 View  02/10/2016  CLINICAL DATA:  Respiratory failure and status post intubation. EXAM: PORTABLE CHEST 1 VIEW COMPARISON:  11/16/2013 FINDINGS: Endotracheal tube present with the tip approximately 3 cm above the carina. Lungs show diffuse pulmonary edema bilaterally. There may be additional component of right lung pneumonia. No pneumothorax. No significant pleural fluid identified. The heart is mildly enlarged. IMPRESSION: Endotracheal tube tip approximately 3 cm above the carina. Diffuse pulmonary edema present. Potential additional component of right lung pneumonia. Electronically Signed   By: Irish Lack M.D.   On: 02/10/2016 19:02     CBC  Recent Labs Lab 02/10/16 1839 02/10/16 1841 02/11/16 0607 02/12/16 0250 02/12/16 2352 02/14/16 0216  WBC 22.4*  --  15.8* 15.6* 12.7* 15.4*  HGB 14.3 18.0* 12.0 12.2 12.1 11.9*  HCT 47.2* 53.0* 39.7 39.3 40.0 39.3  PLT 297  --  191 170 166 231  MCV 81.2  --  79.1 79.7 78.9 78.9  MCH 24.6*  --  23.9* 24.7* 23.9* 23.9*  MCHC 30.3  --  30.2 31.0 30.3 30.3  RDW 16.8*  --  16.6* 16.9* 16.9* 17.0*  LYMPHSABS 6.6*  --   --   --   --   --   MONOABS 1.3*  --   --   --   --   --   EOSABS 0.3  --   --   --   --   --   BASOSABS 0.1  --   --   --   --   --     Chemistries   Recent Labs Lab 02/11/16 0607 02/11/16 1125 02/11/16 1758 02/12/16 0250 02/12/16 2352 02/13/16 0740 02/14/16 0216  NA 137  --   --  137 136 136 136  K 3.7  --  3.3* 3.6 3.2* 3.2* 3.6  CL 98*  --   --  95* 94* 94* 94*  CO2 27  --   --  29 28 29 29   GLUCOSE 258*  --   --  228* 228* 190* 192*  BUN 10  --   --  10 12 14  26*  CREATININE 1.16*  --   --  1.00 0.84 0.78 1.48*   CALCIUM 9.0  --   --  8.9 9.2 9.5 9.5  MG  --  1.9  --  1.7 1.8 1.8  --   AST  --   --   --   --  40  --   --   ALT  --   --   --   --  21  --   --   ALKPHOS  --   --   --   --  68  --   --   BILITOT  --   --   --   --  1.5*  --   --    ------------------------------------------------------------------------------------------------------------------ estimated creatinine clearance is 53.9 mL/min (by C-G formula based on Cr of 1.48). ------------------------------------------------------------------------------------------------------------------ No results for input(s): HGBA1C in the last 72 hours. ------------------------------------------------------------------------------------------------------------------ No results for input(s): CHOL, HDL, LDLCALC, TRIG, CHOLHDL, LDLDIRECT in the last 72 hours. ------------------------------------------------------------------------------------------------------------------ No results for input(s): TSH, T4TOTAL, T3FREE, THYROIDAB in the last 72 hours.  Invalid input(s): FREET3 ------------------------------------------------------------------------------------------------------------------ No results for input(s): VITAMINB12, FOLATE, FERRITIN, TIBC, IRON, RETICCTPCT in the last 72 hours.  Coagulation profile No results for input(s): INR, PROTIME in the last 168 hours.  No results for input(s): DDIMER in the last 72 hours.  Cardiac Enzymes  Recent Labs Lab 02/12/16 1242 02/12/16 2012 02/12/16 2352  TROPONINI 5.20* 4.84* 4.17*   ------------------------------------------------------------------------------------------------------------------ Invalid input(s): POCBNP   CBG:  Recent Labs Lab 02/14/16 1610 02/14/16 2101 02/15/16 0146 02/15/16 0528 02/15/16 0756  GLUCAP 171* 208* 208* 186* 188*       Studies: No results found.    Lab Results  Component Value Date   HGBA1C 7.6 04/27/2014   HGBA1C 7.5* 11/17/2013   HGBA1C  * 07/21/2010    8.8 (NOTE)                                                                       According to the ADA Clinical Practice Recommendations for 2011, when HbA1c is used as a screening test:   >=6.5%   Diagnostic of Diabetes Mellitus           (if abnormal result  is confirmed)  5.7-6.4%   Increased risk of developing Diabetes Mellitus  References:Diagnosis and Classification of Diabetes Mellitus,Diabetes Care,2011,34(Suppl 1):S62-S69 and Standards of Medical Care in         Diabetes - 2011,Diabetes Care,2011,34  (Suppl 1):S11-S61.   Lab Results  Component Value Date   LDLCALC 99 11/17/2013   CREATININE 1.48* 02/14/2016       Scheduled Meds: . aspirin  325 mg Oral Daily  . atorvastatin  40 mg Oral q1800  . carvedilol  6.25 mg Oral BID WC  . famotidine (PEPCID) IV  20 mg Intravenous Q12H  . insulin aspart  0-9 Units Subcutaneous 6 times per day  . insulin glargine  10 Units Subcutaneous QHS  . lisinopril  10 mg Oral Daily   Continuous Infusions:   Active Problems:   Required emergent intubation   Acute respiratory failure (HCC)   Flash pulmonary edema (HCC)   NSTEMI (non-ST elevated myocardial infarction) (HCC)    Time spent:  45 minutes   [facility]  [facility] Pager [phone]. If 7PM-7AM, please contact night-coverage at [email or web address], password [facility] 02/15/2016, 8:23 AM  LOS: 5 days

## 2016-02-16 ENCOUNTER — Encounter (HOSPITAL_COMMUNITY): Payer: Self-pay | Admitting: Cardiovascular Disease

## 2016-02-16 ENCOUNTER — Inpatient Hospital Stay (HOSPITAL_COMMUNITY): Payer: BLUE CROSS/BLUE SHIELD

## 2016-02-16 LAB — COMPREHENSIVE METABOLIC PANEL
ALT: 22 U/L (ref 14–54)
AST: 28 U/L (ref 15–41)
Albumin: 3 g/dL — ABNORMAL LOW (ref 3.5–5.0)
Alkaline Phosphatase: 57 U/L (ref 38–126)
Anion gap: 11 (ref 5–15)
BILIRUBIN TOTAL: 1 mg/dL (ref 0.3–1.2)
BUN: 22 mg/dL — ABNORMAL HIGH (ref 6–20)
CALCIUM: 9.4 mg/dL (ref 8.9–10.3)
CHLORIDE: 99 mmol/L — AB (ref 101–111)
CO2: 24 mmol/L (ref 22–32)
CREATININE: 0.85 mg/dL (ref 0.44–1.00)
GFR calc Af Amer: >60 mL/min (ref >60)
GLUCOSE: 217 mg/dL — AB (ref 65–99)
Potassium: 4.1 mmol/L (ref 3.5–5.1)
Sodium: 134 mmol/L — ABNORMAL LOW (ref 135–145)
Total Protein: 6.1 g/dL — ABNORMAL LOW (ref 6.5–8.1)

## 2016-02-16 LAB — CBC
HCT: 41.6 % (ref 36.0–46.0)
Hemoglobin: 12.3 g/dL (ref 12.0–15.0)
MCH: 23.5 pg — AB (ref 26.0–34.0)
MCHC: 29.6 g/dL — ABNORMAL LOW (ref 30.0–36.0)
MCV: 79.4 fL (ref 78.0–100.0)
PLATELETS: 228 10*3/uL (ref 150–400)
RBC: 5.24 MIL/uL — AB (ref 3.87–5.11)
RDW: 16.7 % — AB (ref 11.5–15.5)
WBC: 10.3 10*3/uL (ref 4.0–10.5)

## 2016-02-16 LAB — CULTURE, BLOOD (ROUTINE X 2)
Culture: NO GROWTH
Culture: NO GROWTH

## 2016-02-16 LAB — GLUCOSE, CAPILLARY
GLUCOSE-CAPILLARY: 192 mg/dL — AB (ref 65–99)
GLUCOSE-CAPILLARY: 158 mg/dL — AB (ref 65–99)
GLUCOSE-CAPILLARY: 195 mg/dL — AB (ref 65–99)
Glucose-Capillary: 166 mg/dL — ABNORMAL HIGH (ref 65–99)
Glucose-Capillary: 189 mg/dL — ABNORMAL HIGH (ref 65–99)
Glucose-Capillary: 208 mg/dL — ABNORMAL HIGH (ref 65–99)

## 2016-02-16 LAB — HEMOGLOBIN A1C
HEMOGLOBIN A1C: 9.2 % — AB (ref 4.8–5.6)
Mean Plasma Glucose: 217 mg/dL

## 2016-02-16 MED ORDER — GUAIFENESIN-DM 100-10 MG/5ML PO SYRP
5.0000 mL | ORAL_SOLUTION | Freq: Four times a day (QID) | ORAL | Status: DC
  Administered 2016-02-16 – 2016-02-17 (×4): 5 mL via ORAL
  Filled 2016-02-16 (×4): qty 5

## 2016-02-16 MED ORDER — LISINOPRIL 10 MG PO TABS
20.0000 mg | ORAL_TABLET | Freq: Every day | ORAL | Status: DC
  Administered 2016-02-16 – 2016-02-17 (×2): 20 mg via ORAL
  Filled 2016-02-16 (×3): qty 2

## 2016-02-16 MED ORDER — LIVING WELL WITH DIABETES BOOK
Freq: Once | Status: AC
  Administered 2016-02-16: 12:00:00
  Filled 2016-02-16: qty 1

## 2016-02-16 MED ORDER — TICAGRELOR 90 MG PO TABS
90.0000 mg | ORAL_TABLET | Freq: Two times a day (BID) | ORAL | Status: DC
  Administered 2016-02-16 – 2016-02-17 (×2): 90 mg via ORAL
  Filled 2016-02-16 (×2): qty 1

## 2016-02-16 MED ORDER — FAMOTIDINE 20 MG PO TABS
20.0000 mg | ORAL_TABLET | Freq: Two times a day (BID) | ORAL | Status: DC
  Administered 2016-02-16 – 2016-02-17 (×2): 20 mg via ORAL
  Filled 2016-02-16 (×2): qty 1

## 2016-02-16 MED ORDER — INSULIN ASPART 100 UNIT/ML ~~LOC~~ SOLN
0.0000 [IU] | Freq: Three times a day (TID) | SUBCUTANEOUS | Status: DC
  Administered 2016-02-17: 07:00:00 2 [IU] via SUBCUTANEOUS
  Administered 2016-02-17: 12:00:00 3 [IU] via SUBCUTANEOUS

## 2016-02-16 MED ORDER — GUAIFENESIN-DM 100-10 MG/5ML PO SYRP
15.0000 mL | ORAL_SOLUTION | ORAL | Status: DC | PRN
  Administered 2016-02-16: 15 mL via ORAL
  Filled 2016-02-16: qty 15

## 2016-02-16 NOTE — Progress Notes (Signed)
TR BAND REMOVAL  LOCATION:    right radial  DEFLATED PER PROTOCOL:    Yes.  by Elige Radon RN prior to myself arriving for duty.   TIME BAND OFF / DRESSING APPLIED:    1930      SITE UPON ARRIVAL:    Level 0 - per Celeisha RN report    SITE AFTER BAND REMOVAL:    Level 1  CIRCULATION SENSATION AND MOVEMENT:    Within Normal Limits   Yes.    COMMENTS:   Hematoma level 1, small bruise just left of puncture site. <3cm hematoma over insertion site; pressure held for 5 minutes on hematoma. No bleeding. Hematoma resolved. Pt tolerated well and education on site care

## 2016-02-16 NOTE — Progress Notes (Addendum)
CARDIAC REHAB PHASE I   PRE:  Rate/Rhythm: 75 SR  BP:  Supine:   Sitting: 126/97  Standing:    SaO2: 97%RA  MODE:  Ambulation: 300 ft   POST:  Rate/Rhythm: 93 SR  BP:  Supine:   Sitting: 156/58  Standing:    SaO2: 98%RA hall and room [phone] Pt walked 300 ft with rolling walker and minimal asst. Pt does not think she needs walker for home use. No CP. Sats good on RA. To recliner with call bell. Gave pt CHF booklet and reviewed when to call MD and importance of daily weights, fluid restriction and low sodium diet of 2000 mg. Gave MI booklet and discussed restrictions, use of NTG tablets, importance of brilinta with stent and carb counting. A1C at 9.2. Pt stated has not been taking metformin as she should if money is short. Told her she needs to really keep a watch on diabetes and take her meds every day and I discussed carb counting with her as she stated she did not know about it. Talked with case manager to see if she could get pt scales and to let her know pt has had some financial difficulty when it comes to meds. Encouraged pt to walk as tolerated but did not give ex ed as returning for staged PCI. Gave brochure for [place] so pt can check with her insurance and read about program. Will refer after staged PCI. Pt has Phase 2 referral per Dr Kirke Corin.   Luetta Nutting, RN BSN  02/16/2016 9:07 AM

## 2016-02-16 NOTE — Progress Notes (Signed)
SUBJECTIVE:    No chest pain.  She was SOB last night and had a cough.     PHYSICAL EXAM Filed Vitals:   02/15/16 2000 02/15/16 2100 02/15/16 2200 02/16/16 0135  BP: 144/83 145/53 152/67 130/52  Pulse: 70 68 86 90  Temp:    99.4 F (37.4 C)  TempSrc:    Oral  Resp: 14 17 9 21   Height:      Weight:    231 lb 4.2 oz (104.9 kg)  SpO2: 97% 100% 98% 99%   General:  No acute distress Lungs:  Clear Heart:  RRR Abdomen:  Positive bowel sounds, no rebound no guarding Extremities:  No edema, right groin without bleeding.  Slight bruise   LABS:  Results for orders placed or performed during the hospital encounter of 02/10/16 (from the past 24 hour(s))  Glucose, capillary     Status: Abnormal   Collection Time: 02/15/16  7:56 AM  Result Value Ref Range   Glucose-Capillary 188 (H) 65 - 99 mg/dL   Comment 1 Notify RN    Comment 2 Document in Chart   Heparin level (unfractionated)     Status: Abnormal   Collection Time: 02/15/16  8:12 AM  Result Value Ref Range   Heparin Unfractionated <0.10 (L) 0.30 - 0.70 IU/mL  Comprehensive metabolic panel     Status: Abnormal   Collection Time: 02/15/16  8:12 AM  Result Value Ref Range   Sodium 135 135 - 145 mmol/L   Potassium 3.8 3.5 - 5.1 mmol/L   Chloride 97 (L) 101 - 111 mmol/L   CO2 26 22 - 32 mmol/L   Glucose, Bld 172 (H) 65 - 99 mg/dL   BUN 31 (H) 6 - 20 mg/dL   Creatinine, Ser 9.17 (H) 0.44 - 1.00 mg/dL   Calcium 9.8 8.9 - 91.5 mg/dL   Total Protein 6.1 (L) 6.5 - 8.1 g/dL   Albumin 2.8 (L) 3.5 - 5.0 g/dL   AST 28 15 - 41 U/L   ALT 20 14 - 54 U/L   Alkaline Phosphatase 60 38 - 126 U/L   Total Bilirubin 0.7 0.3 - 1.2 mg/dL   GFR calc non Af Amer >60 >60 mL/min   GFR calc Af Amer >60 >60 mL/min   Anion gap 12 5 - 15  Hemoglobin A1c     Status: Abnormal   Collection Time: 02/15/16  8:12 AM  Result Value Ref Range   Hgb A1c MFr Bld 9.2 (H) 4.8 - 5.6 %   Mean Plasma Glucose 217 mg/dL  Protime-INR     Status: None   Collection Time: 02/15/16  8:12 AM  Result Value Ref Range   Prothrombin Time 14.1 11.6 - 15.2 seconds   INR 1.07 0.00 - 1.49  Glucose, capillary     Status: Abnormal   Collection Time: 02/15/16 12:32 PM  Result Value Ref Range   Glucose-Capillary 172 (H) 65 - 99 mg/dL   Comment 1 Notify RN    Comment 2 Document in Chart   Pregnancy, urine     Status: None   Collection Time: 02/15/16 12:40 PM  Result Value Ref Range   Preg Test, Ur NEGATIVE NEGATIVE  POCT Activated clotting time     Status: None   Collection Time: 02/15/16  3:58 PM  Result Value Ref Range   Activated Clotting Time 533 seconds  Glucose, capillary     Status: Abnormal   Collection Time: 02/15/16  5:01 PM  Result  Value Ref Range   Glucose-Capillary 150 (H) 65 - 99 mg/dL  CBC     Status: Abnormal   Collection Time: 02/15/16  5:13 PM  Result Value Ref Range   WBC 9.0 4.0 - 10.5 K/uL   RBC 5.17 (H) 3.87 - 5.11 MIL/uL   Hemoglobin 12.4 12.0 - 15.0 g/dL   HCT 65.7 84.6 - 96.2 %   MCV 77.4 (L) 78.0 - 100.0 fL   MCH 24.0 (L) 26.0 - 34.0 pg   MCHC 31.0 30.0 - 36.0 g/dL   RDW 95.2 (H) 84.1 - 32.4 %   Platelets 195 150 - 400 K/uL  Creatinine, serum     Status: None   Collection Time: 02/15/16  5:13 PM  Result Value Ref Range   Creatinine, Ser 0.89 0.44 - 1.00 mg/dL   GFR calc non Af Amer >60 >60 mL/min   GFR calc Af Amer >60 >60 mL/min  Glucose, capillary     Status: Abnormal   Collection Time: 02/15/16  7:48 PM  Result Value Ref Range   Glucose-Capillary 178 (H) 65 - 99 mg/dL   Comment 1 Notify RN    Comment 2 Document in Chart   Glucose, capillary     Status: Abnormal   Collection Time: 02/16/16 12:37 AM  Result Value Ref Range   Glucose-Capillary 166 (H) 65 - 99 mg/dL   Comment 1 Notify RN    Comment 2 Document in Chart   CBC     Status: Abnormal   Collection Time: 02/16/16  3:39 AM  Result Value Ref Range   WBC 10.3 4.0 - 10.5 K/uL   RBC 5.24 (H) 3.87 - 5.11 MIL/uL   Hemoglobin 12.3 12.0 - 15.0 g/dL    HCT 40.1 02.7 - 25.3 %   MCV 79.4 78.0 - 100.0 fL   MCH 23.5 (L) 26.0 - 34.0 pg   MCHC 29.6 (L) 30.0 - 36.0 g/dL   RDW 66.4 (H) 40.3 - 47.4 %   Platelets 228 150 - 400 K/uL  Comprehensive metabolic panel     Status: Abnormal   Collection Time: 02/16/16  3:39 AM  Result Value Ref Range   Sodium 134 (L) 135 - 145 mmol/L   Potassium 4.1 3.5 - 5.1 mmol/L   Chloride 99 (L) 101 - 111 mmol/L   CO2 24 22 - 32 mmol/L   Glucose, Bld 217 (H) 65 - 99 mg/dL   BUN 22 (H) 6 - 20 mg/dL   Creatinine, Ser 2.59 0.44 - 1.00 mg/dL   Calcium 9.4 8.9 - 56.3 mg/dL   Total Protein 6.1 (L) 6.5 - 8.1 g/dL   Albumin 3.0 (L) 3.5 - 5.0 g/dL   AST 28 15 - 41 U/L   ALT 22 14 - 54 U/L   Alkaline Phosphatase 57 38 - 126 U/L   Total Bilirubin 1.0 0.3 - 1.2 mg/dL   GFR calc non Af Amer >60 >60 mL/min   GFR calc Af Amer >60 >60 mL/min   Anion gap 11 5 - 15  Glucose, capillary     Status: Abnormal   Collection Time: 02/16/16  3:53 AM  Result Value Ref Range   Glucose-Capillary 189 (H) 65 - 99 mg/dL    Intake/Output Summary (Last 24 hours) at 02/16/16 0739 Last data filed at 02/16/16 0310  Gross per 24 hour  Intake 907.45 ml  Output   2100 ml  Net -1192.55 ml    CATH  Ost Cx to Prox Cx lesion, 20%  stenosed.  Ost LAD lesion, 50% stenosed.  2nd Mrg lesion, 40% stenosed.  Ost 3rd Mrg to 3rd Mrg lesion, 50% stenosed.  Ost 2nd Diag to 2nd Diag lesion, 70% stenosed.  Mid RCA lesion, 85% stenosed.  Dist RCA lesion, 30% stenosed.  Ost RPDA to RPDA lesion, 50% stenosed.  Mid LAD to Dist LAD lesion, 99% stenosed. Post intervention, there is a 0% residual stenosis.  Ost 1st Diag to 1st Diag lesion, 99% stenosed. Post intervention, there is a 40% residual stenosis.  1. Severe two-vessel coronary artery disease involving the mid to distal LAD, large first diagonal and mid right coronary artery. 2. Moderately elevated left ventricular end-diastolic pressure with an LVEDP of 22 mmHg. Left ventricular  angiography was not performed due to recent renal failure and known EF by echo. 3.Successful angioplasty and drug-eluting stent placement to the mid LAD and balloon angioplasty of the first diagonal.  Recommendations: Dual antiplatelet therapy for at least one year. Staged RCA PCI in 3-4 weeks.  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 Avoid the right radial artery catheterization due to dual radial system.   ASSESSMENT AND PLAN:  ACUTE ON CHRONIC DIASTOLIC HF:    Breathing is short but she seems to be euvolemic   NSTEMI:    Results as above.  Continue DAPT.  Repeat EKG in the AM to look at new LBBB   HTN:    Increase lisinopril to 20 mg daily.   CM:  EF is mildly reduced.  Question of thrombus or mass on the mitral.  However, she has a difficult time swallowing and might be higher risk for TEE.  No plans for TEE at this point.   CKD:  Creat is improved.    Rollene Rotunda 02/16/2016 7:39 AM

## 2016-02-16 NOTE — Progress Notes (Signed)
[facility] PROGRESS NOTE  [name] WGN:[id number] DOB: 24-Nov-1965 DOA: 02/10/2016 PCP: Uvaldo Rising, MD  Length of stay: 6  [facility] TOOK OVER- 02/15/2016.    Assessment/Plan: Active Problems:   Required emergent intubation   Acute respiratory failure (HCC)   Flash pulmonary edema (HCC)   NSTEMI (non-ST elevated myocardial infarction) (HCC)   Hypertensive heart disease with heart failure (HCC)   HISTORY OF PRESENT ILLNESS:  Ms. Hilyer is a 50 year old female with a past medical history of HFpEF, HTN, DM II, Obesity presenting with acute onset shortness of breath. Family reports the patient was doing well today with no complaints, went home tonight and around 6 PM she was found banging on the bathroom walls in respiratory distress. Reportedly patient was turning blue when she was found. Family reports she has had a viral illness for the past month with most of the family having the flu recently. Reports a history of non-compliance.   On arrival to the ED she was obtunded on CPAP from EMS. She was hypoxic and placed on BIPAP and started on a nitroglycerin infusion. Bedside ultrasound done in the ED showed no evidence of pericardial effuison and depressed left ventricular EF. IVC dynamics were consistent with volume overload. Patient had declining mental status and acute onset respiratory distress requiring intubation likely secondary to flash pulmonary edema with possible underlying pneumonia.. Tolerated wean- 4/10 then extubated. Also NSTEMI with trop peaked at 7. Cards consulted, possible Cath prior to discharge. ECHO with no regional wall motion abnormality.   Assessment and plan Acute Hypoxemic hypercapneic Respiratory Failure Bilateral Pulmonary Edema Possible CAP Initially admitted for flash pulmonary edema, resolved, appears euvolemic at this time Tolerated wean, extubated to Nasal Canula on 4/10. Continue diuresis Antibiotics d/c improved chest xray  findings with diuresis Continue telemetry. Repeat chest x-ray because of the cough, start patient on Robitussin-DM   NSTEMI/demand ischemia, Trop - peaked at 7 Flash pulmonary edema due to HTN HFpEF - ECHO 02/11/2016 EF 55-60%, G2 Diastolic Dysfunction, EF- 4/10- reduced  Abnormal TTE, possible MV lesion / mass.   Discontinued IV lasix 40mg  BID in anticipation for cardiac cath, BMP pending this morning Cardiac cath -Successful angioplasty and drug-eluting stent placement to the mid LAD and balloon angioplasty of the first diagonal Recommend Dual antiplatelet therapy for at least one year. Staged RCA PCI in 3-4 weeks Cont home Bp meds- Coreg 6.25mg  BID, Lisinopril- 10mg  daily If worsening Cr tomorrow, consider d/c lisinopril am IV hydral 10mg  PRN for Bp >180 Will need TEE for MV mass- Cards eval- has difficult time with swallowing, higher risk for TEE, no plans for TEE at this point. Question of thrombus or mass on the mitral. However, she has a difficult time swallowing and might be higher risk for TEE. No plans for TEE at this point.     Left Bundle branch block Repeat EKG in the AM to look at new LBBB  patient developed left bundle branch block after engaging the left ventricle per cardiology. This typically resolves within 24 hours  Pulm edema Hypokalemia AKI D/c Iv lasix 40mg  BID, Follow renal function, may need to reinitiate Lasix post cath if renal function is stable    GI Ulcer PPx Pepcid PO Passed Swallow eval Advance diet     Leukocytosis Possible CAP on Chest xray D/ c Ceftriaxone and Azithromycin -4/7-4/10 Follow up blood cultures,  Trach aspirate- normal flora CBC, BET in am Flu PCR- neg  If worsening leukocytosis/ or fevers consider restart CAP coverage      Diabetes mellitus- metformin on hold, check hemoglobin A1c Cont sensitive SSI Cont LAntus 10u daily      DVT prophylaxsis  Lovenox post cardiac cath  Code Status:      Code Status Orders         Start     Ordered   02/11/16 0000  Full code   Continuous     02/10/16 2359     Family Communication: Discussed in detail with the patient's daughter and patient , all imaging results, lab results explained to the patient   Disposition Plan:  As per cardiology, Likely in the morning     Consultants:  Critical care  Cardiology    STUDIES:  02/10/2016 CXR: Diffuse pulmonary edema bilaterally, cardiomegaly and rotation limit film quality 02/11/2016 ECHO- EF- 55-60%. No Regional Wall motion abnormality. Grade 2 DD, IVC normal size, PA pressure Normal. 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  CULTURES: 02/10/2016 BCx x2 >> Pending 02/10/2016 Trach aspirate >> Normal Oropharyngeal flora- Final.  ANTIBIOTICS: 02/10/2016 Ceftriaxone 2g daily >> 4/10 02/10/2016 Azithromycin 500 mg daily >>4/10  SIGNIFICANT EVENTS: 02/10/16 Intubated. 02/13/16 Extubated to Nasal Canula  LINES/TUBES: 02/10/2016 PIV left hand, right hand 02/10/2016 7.5 mm Endotracheal tube >>4/10 02/10/2016 OG Tube  02/10/2016 Foley   Antibiotics: Anti-infectives    Start     Dose/Rate Route Frequency Ordered Stop   02/10/16 2145  cefTRIAXone (ROCEPHIN) 2 g in dextrose 5 % 50 mL IVPB  Status:  Discontinued     2 g 100 mL/hr over 30 Minutes Intravenous Every 24 hours 02/10/16 2131 02/13/16 1122   02/10/16 2145  azithromycin (ZITHROMAX) 500 mg in dextrose 5 % 250 mL IVPB  Status:  Discontinued     500 mg 250 mL/hr over 60 Minutes Intravenous Every 24 hours 02/10/16 2131 02/13/16 1122         HPI/Subjective: Patient complaining of chest congestion and cough and requesting a cough syrup  Objective: Filed Vitals:   02/15/16 2100 02/15/16 2200 02/16/16 0135 02/16/16 0800  BP: 145/53 152/67 130/52 126/97  Pulse: 68 86 90 76  Temp:   99.4 F (37.4 C) 97.5 F (36.4 C)  TempSrc:   Oral Oral  Resp: 17 9  21 21   Height:      Weight:   104.9 kg (231 lb 4.2 oz)   SpO2: 100% 98% 99% 99%    Intake/Output Summary (Last 24 hours) at 02/16/16 0836 Last data filed at 02/16/16 1610  Gross per 24 hour  Intake 1027.45 ml  Output   2100 ml  Net -1072.55 ml    Exam:  General: Morbidly obese female. Sleepy but arousable.  Neuro:On nasal canula, following commands. Cardiovascular: RRR, no murmurs rubs or gallops Lungs: Coarse breath sounds bilat, no added sounds appreciated ant auscultation Abdomen: Soft, obese, BS+, not tender. Skin: warm, dry, no rash  Data Review   Micro Results Recent Results (from the past 240 hour(s))  Culture, respiratory (NON-Expectorated)     Status: None   Collection Time: 02/10/16 11:46 PM  Result Value Ref Range Status   Specimen Description NASOPHARYNGEAL  Final   Special Requests Normal  Final   Gram Stain   Final    FEW WBC PRESENT,BOTH PMN AND MONONUCLEAR NO SQUAMOUS EPITHELIAL CELLS SEEN FEW GRAM POSITIVE COCCI IN PAIRS Performed at [facility]  Culture   Final    NORMAL OROPHARYNGEAL FLORA Performed at [facility]    Report Status 02/13/2016 FINAL  Final  MRSA PCR Screening     Status: None   Collection Time: 02/10/16 11:46 PM  Result Value Ref Range Status   MRSA by PCR NEGATIVE NEGATIVE Final    Comment:        The GeneXpert MRSA Assay (FDA approved for NASAL specimens only), is one component of a comprehensive MRSA colonization surveillance program. It is not intended to diagnose MRSA infection nor to guide or monitor treatment for MRSA infections.   Culture, blood (routine x 2)     Status: None (Preliminary result)   Collection Time: 02/10/16 11:55 PM  Result Value Ref Range Status   Specimen Description BLOOD RIGHT HAND  Final   Special Requests IN PEDIATRIC BOTTLE  Final   Culture NO GROWTH 4 DAYS  Final   Report Status PENDING  Incomplete  Culture, blood (routine x 2)     Status: None (Preliminary  result)   Collection Time: 02/11/16 12:05 AM  Result Value Ref Range Status   Specimen Description BLOOD LEFT HAND  Final   Special Requests IN PEDIATRIC BOTTLE  Final   Culture NO GROWTH 4 DAYS  Final   Report Status PENDING  Incomplete    Radiology Reports Dg Chest Port 1 View  02/13/2016  CLINICAL DATA:  Pulmonary edema. EXAM: PORTABLE CHEST 1 VIEW COMPARISON:  02/2016. FINDINGS: Endotracheal tube and NG tube in stable position. Cardiomegaly with pulmonary infiltrates with pulmonary edema. Interim partial clearing from prior exam. No significant pleural effusion. No pneumothorax. IMPRESSION: 1. Lines and tubes in stable position. 2. Cardiomegaly with bilateral pulmonary infiltrates consistent with pulmonary edema. Partial clearing from prior exam. Electronically Signed   By: Maisie Fus  Register   On: 02/13/2016 07:02   Dg Chest Portable 1 View  02/10/2016  CLINICAL DATA:  Respiratory failure and status post intubation. EXAM: PORTABLE CHEST 1 VIEW COMPARISON:  11/16/2013 FINDINGS: Endotracheal tube present with the tip approximately 3 cm above the carina. Lungs show diffuse pulmonary edema bilaterally. There may be additional component of right lung pneumonia. No pneumothorax. No significant pleural fluid identified. The heart is mildly enlarged. IMPRESSION: Endotracheal tube tip approximately 3 cm above the carina. Diffuse pulmonary edema present. Potential additional component of right lung pneumonia. Electronically Signed   By: Irish Lack M.D.   On: 02/10/2016 19:02     CBC  Recent Labs Lab 02/10/16 1839  02/12/16 0250 02/12/16 2352 02/14/16 0216 02/15/16 1713 02/16/16 0339  WBC 22.4*  < > 15.6* 12.7* 15.4* 9.0 10.3  HGB 14.3  < > 12.2 12.1 11.9* 12.4 12.3  HCT 47.2*  < > 39.3 40.0 39.3 40.0 41.6  PLT 297  < > 170 166 231 195 228  MCV 81.2  < > 79.7 78.9 78.9 77.4* 79.4  MCH 24.6*  < > 24.7* 23.9* 23.9* 24.0* 23.5*  MCHC 30.3  < > 31.0 30.3 30.3 31.0 29.6*  RDW 16.8*  < >  16.9* 16.9* 17.0* 16.5* 16.7*  LYMPHSABS 6.6*  --   --   --   --   --   --   MONOABS 1.3*  --   --   --   --   --   --   EOSABS 0.3  --   --   --   --   --   --   BASOSABS 0.1  --   --   --   --   --   --   < > =  values in this interval not displayed.  Chemistries   Recent Labs Lab 02/11/16 1125  02/12/16 0250 02/12/16 2352 02/13/16 0740 02/14/16 0216 02/15/16 0812 02/15/16 1713 02/16/16 0339  NA  --   --  137 136 136 136 135  --  134*  K  --   < > 3.6 3.2* 3.2* 3.6 3.8  --  4.1  CL  --   --  95* 94* 94* 94* 97*  --  99*  CO2  --   --  29 28 29 29 26   --  24  GLUCOSE  --   --  228* 228* 190* 192* 172*  --  217*  BUN  --   --  10 12 14  26* 31*  --  22*  CREATININE  --   --  1.00 0.84 0.78 1.48* 1.02* 0.89 0.85  CALCIUM  --   --  8.9 9.2 9.5 9.5 9.8  --  9.4  MG 1.9  --  1.7 1.8 1.8  --   --   --   --   AST  --   --   --  40  --   --  28  --  28  ALT  --   --   --  21  --   --  20  --  22  ALKPHOS  --   --   --  68  --   --  60  --  57  BILITOT  --   --   --  1.5*  --   --  0.7  --  1.0  < > = values in this interval not displayed. ------------------------------------------------------------------------------------------------------------------ estimated creatinine clearance is 93.8 mL/min (by C-G formula based on Cr of 0.85). ------------------------------------------------------------------------------------------------------------------  Recent Labs  02/15/16 0812  HGBA1C 9.2*   ------------------------------------------------------------------------------------------------------------------ No results for input(s): CHOL, HDL, LDLCALC, TRIG, CHOLHDL, LDLDIRECT in the last 72 hours. ------------------------------------------------------------------------------------------------------------------ No results for input(s): TSH, T4TOTAL, T3FREE, THYROIDAB in the last 72 hours.  Invalid input(s):  FREET3 ------------------------------------------------------------------------------------------------------------------ No results for input(s): VITAMINB12, FOLATE, FERRITIN, TIBC, IRON, RETICCTPCT in the last 72 hours.  Coagulation profile  Recent Labs Lab 02/15/16 0812  INR 1.07    No results for input(s): DDIMER in the last 72 hours.  Cardiac Enzymes  Recent Labs Lab 02/12/16 1242 02/12/16 2012 02/12/16 2352  TROPONINI 5.20* 4.84* 4.17*   ------------------------------------------------------------------------------------------------------------------ Invalid input(s): POCBNP   CBG:  Recent Labs Lab 02/15/16 1701 02/15/16 1948 02/16/16 0037 02/16/16 0353 02/16/16 0804  GLUCAP 150* 178* 166* 189* 192*       Studies: No results found.    Lab Results  Component Value Date   HGBA1C 9.2* 02/15/2016   HGBA1C 7.6 04/27/2014   HGBA1C 7.5* 11/17/2013   Lab Results  Component Value Date   LDLCALC 99 11/17/2013   CREATININE 0.85 02/16/2016       Scheduled Meds: . aspirin  81 mg Oral Daily  . atorvastatin  40 mg Oral q1800  . carvedilol  6.25 mg Oral BID WC  . enoxaparin (LOVENOX) injection  40 mg Subcutaneous Q24H  . famotidine (PEPCID) IV  20 mg Intravenous Q12H  . insulin aspart  0-9 Units Subcutaneous 6 times per day  . insulin glargine  10 Units Subcutaneous QHS  . lisinopril  20 mg Oral Daily  . sodium chloride flush  3 mL Intravenous Q12H  . ticagrelor  90 mg Oral BID   Continuous Infusions:   Active Problems:   Required  emergent intubation   Acute respiratory failure (HCC)   Flash pulmonary edema (HCC)   NSTEMI (non-ST elevated myocardial infarction) (HCC)   Hypertensive heart disease with heart failure (HCC)    Time spent: 45 minutes   [facility]  [facility] Pager [phone]. If 7PM-7AM, please contact night-coverage at [email or web address], password [facility] 02/16/2016, 8:36 AM  LOS: 6 days

## 2016-02-16 NOTE — Care Management Note (Signed)
Case Management Note  Patient Details  Name: [name] MRN: [id number] Date of Birth: [birth year]  Subjective/Objective:  Patient is from home, on [employer] co pay is 117.92 for #60, NCM gave patient the 30 day savings card, she will go to [facility] outpatient pharmacy to get her 30 days free at discharge.  NCM will cont to follow for dc needs.                   Action/Plan:   Expected Discharge Date:                  Expected Discharge Plan:  Home/Self Care  In-House Referral:     Discharge planning Services     Post Acute Care Choice:    Choice offered to:     DME Arranged:    DME Agency:     HH Arranged:    HH Agency:     Status of Service:  In process, will continue to follow  Medicare Important Message Given:    Date Medicare IM Given:    Medicare IM give by:    Date Additional Medicare IM Given:    Additional Medicare Important Message give by:     If discussed at Long Length of Stay Meetings, dates discussed:    Additional Comments:  Leone Haven, RN 02/16/2016, 3:58 PM

## 2016-02-16 NOTE — Progress Notes (Signed)
Inpatient Diabetes Program Recommendations  AACE/ADA: New Consensus Statement on Inpatient Glycemic Control (2015)  Target Ranges:  Prepandial:   less than 140 mg/dL      Peak postprandial:   less than 180 mg/dL (1-2 hours)      Critically ill patients:  140 - 180 mg/dL   Review of Glycemic Control Results for EQUILLA, PYLAND (MRN [id number]) as of 02/16/2016 12:51  Ref. Range 02/15/2016 19:48 02/16/2016 00:37 02/16/2016 03:53 02/16/2016 08:04 02/16/2016 11:33  Glucose-Capillary Latest Ref Range: 65-99 mg/dL 333 (H) 832 (H) 919 (H) 192 (H) 158 (H)   Diabetes history: DM2 Outpatient Diabetes medications: metformin 500 bid (Has not been taking for a while) Current orders for Inpatient glycemic control: Lantus 10 units QHS, Novolog sensitive Q4H  Inpatient Diabetes Program Recommendations:  Spoke with patient and she states "I live from pay check to pay check". Patient plans to followup with PCP @ [facility]. Patient said she tried to make her medicine last longer by only taking part of the time. Reviewed with patient her A1c elevated and need to reduce blood sugars to decrease risks. Has Living Well with Diabetes book @ bedside.Patient states she plans to also attend outpatient cardiac rehab program. Discussed with nurse Victorino Dike regarding patient watching videos on diabetes management. Patient verbalized understanding of need to take medications as prescribed, check CBGs, and followup with PCP.  Thank you, Billy Fischer. Maximillian Habibi, RN, MSN, CDE Inpatient Glycemic Control Team Team Pager [phone] (8am-5pm) 02/16/2016 1:18 PM

## 2016-02-17 ENCOUNTER — Other Ambulatory Visit: Payer: Self-pay | Admitting: Family Medicine

## 2016-02-17 LAB — BASIC METABOLIC PANEL
ANION GAP: 10 (ref 5–15)
BUN: 20 mg/dL (ref 6–20)
CALCIUM: 9.8 mg/dL (ref 8.9–10.3)
CHLORIDE: 101 mmol/L (ref 101–111)
CO2: 25 mmol/L (ref 22–32)
Creatinine, Ser: 0.9 mg/dL (ref 0.44–1.00)
GFR calc Af Amer: >60 mL/min (ref >60)
GFR calc non Af Amer: >60 mL/min (ref >60)
Glucose, Bld: 179 mg/dL — ABNORMAL HIGH (ref 65–99)
Potassium: 3.9 mmol/L (ref 3.5–5.1)
Sodium: 136 mmol/L (ref 135–145)

## 2016-02-17 LAB — GLUCOSE, CAPILLARY
GLUCOSE-CAPILLARY: 185 mg/dL — AB (ref 65–99)
Glucose-Capillary: 204 mg/dL — ABNORMAL HIGH (ref 65–99)

## 2016-02-17 MED ORDER — ANGIOPLASTY BOOK
Freq: Once | Status: AC
  Administered 2016-02-17: 07:00:00
  Filled 2016-02-17: qty 1

## 2016-02-17 MED ORDER — LISINOPRIL 20 MG PO TABS: 20.0000 mg | ORAL_TABLET | Freq: Every day | ORAL | Status: DC

## 2016-02-17 MED ORDER — TICAGRELOR 90 MG PO TABS: 90.0000 mg | ORAL_TABLET | Freq: Two times a day (BID) | ORAL | Status: DC

## 2016-02-17 MED ORDER — BLOOD GLUCOSE MONITOR KIT: PACK | Status: DC

## 2016-02-17 MED ORDER — LIVING WELL WITH DIABETES BOOK
Freq: Once | Status: AC
  Administered 2016-02-17: 07:00:00
  Filled 2016-02-17: qty 1

## 2016-02-17 MED ORDER — INSULIN STARTER KIT- SYRINGES (ENGLISH)
1.0000 | Freq: Once | Status: DC
  Filled 2016-02-17: qty 1

## 2016-02-17 MED ORDER — INSULIN GLARGINE 100 UNIT/ML ~~LOC~~ SOLN: 10.0000 [IU] | Freq: Every day | SUBCUTANEOUS | Status: DC

## 2016-02-17 NOTE — Discharge Instructions (Signed)
Aspirin and Your Heart  Aspirin is a medicine that affects the way blood clots. Aspirin can be used to help reduce the risk of blood clots, heart attacks, and other heart-related problems.  SHOULD I TAKE ASPIRIN? Your health care provider will help you determine whether it is safe and beneficial for you to take aspirin daily. Taking aspirin daily may be beneficial if you:  Have had a heart attack or chest pain.  Have undergone open heart surgery such as coronary artery bypass surgery (CABG).  Have had coronary angioplasty.  Have experienced a stroke or transient ischemic attack (TIA).  Have peripheral vascular disease (PVD).  Have chronic heart rhythm problems such as atrial fibrillation. ARE THERE ANY RISKS OF TAKING ASPIRIN DAILY? Daily use of aspirin can increase your risk of side effects. Some of these include:  Bleeding. Bleeding problems can be minor or serious. An example of a minor problem is a cut that does not stop bleeding. An example of a more serious problem is stomach bleeding or bleeding into the brain. Your risk of bleeding is increased if you are also taking non-steroidal anti-inflammatory medicine (NSAIDs).  Increased bruising.  Upset stomach.  An allergic reaction. People who have nasal polyps have an increased risk of developing an aspirin allergy. WHAT ARE SOME GUIDELINES I SHOULD FOLLOW WHEN TAKING ASPIRIN?   Take aspirin only as directed by your health care provider. Make sure you understand how much you should take and what form you should take. The two forms of aspirin are:  Non-enteric-coated. This type of aspirin does not have a coating and is absorbed quickly. Non-enteric-coated aspirin is usually recommended for people with chest pain. This type of aspirin also comes in a chewable form.  Enteric-coated. This type of aspirin has a special coating that releases the medicine very slowly. Enteric-coated aspirin causes less stomach upset than non-enteric-coated  aspirin. This type of aspirin should not be chewed or crushed.  Drink alcohol in moderation. Drinking alcohol increases your risk of bleeding. WHEN SHOULD I SEEK MEDICAL CARE?   You have unusual bleeding or bruising.  You have stomach pain.  You have an allergic reaction. Symptoms of an allergic reaction include:  Hives.  Itchy skin.  Swelling of the lips, tongue, or face.  You have ringing in your ears. WHEN SHOULD I SEEK IMMEDIATE MEDICAL CARE?   Your bowel movements are bloody, dark red, or black in color.  You vomit or cough up blood.  You have blood in your urine.  You cough, wheeze, or feel short of breath. If you have any of the following symptoms, this is an emergency. Do not wait to see if the pain will go away. Get medical help at once. Call your local emergency services (911 in the [place] Do not drive yourself to the hospital.  You have severe chest pain, especially if the pain is crushing or pressure-like and spreads to the arms, back, neck, or jaw.  You have stroke-like symptoms, such as:   Loss of vision.   Difficulty talking.   Numbness or weakness on one side of your body.   Numbness or weakness in your arm or leg.   Not thinking clearly or feeling confused.    This information is not intended to replace advice given to you by your health care provider. Make sure you discuss any questions you have with your health care provider.   Document Released: 10/04/2008 Document Revised: 11/12/2014 Document Reviewed: 01/27/2014 Elsevier Interactive Patient Education 2016 Elsevier   Inc.  

## 2016-02-17 NOTE — Progress Notes (Signed)
SUBJECTIVE:    No chest pain.  Upper airway stridor.     PHYSICAL EXAM Filed Vitals:   02/16/16 1219 02/16/16 1610 02/16/16 1915 02/17/16 0339  BP: 134/49 140/69 148/76 116/48  Pulse: 80 66 91 63  Temp: 97.6 F (36.4 C) 98.3 F (36.8 C) 98.1 F (36.7 C) 97.4 F (36.3 C)  TempSrc: Oral Oral Oral Oral  Resp: Height:      Weight:    235 lb 14.3 oz (107 kg)  SpO2: 97% 97% 100% 100%   General:  No acute distress Lungs:  Clear, transmitted upper airway sounds.  Heart:  RRR Abdomen:  Positive bowel sounds, no rebound no guarding Extremities:  No edema, right groin without bleeding.  Slight bruise   LABS:  Results for orders placed or performed during the hospital encounter of 02/10/16 (from the past 24 hour(s))  Glucose, capillary     Status: Abnormal   Collection Time: 02/16/16 11:33 AM  Result Value Ref Range   Glucose-Capillary 158 (H) 65 - 99 mg/dL  Glucose, capillary     Status: Abnormal   Collection Time: 02/16/16  4:58 PM  Result Value Ref Range   Glucose-Capillary 195 (H) 65 - 99 mg/dL  Glucose, capillary     Status: Abnormal   Collection Time: 02/16/16  9:00 PM  Result Value Ref Range   Glucose-Capillary 208 (H) 65 - 99 mg/dL  Basic metabolic panel     Status: Abnormal   Collection Time: 02/17/16  3:25 AM  Result Value Ref Range   Sodium 136 135 - 145 mmol/L   Potassium 3.9 3.5 - 5.1 mmol/L   Chloride 101 101 - 111 mmol/L   CO2 25 22 - 32 mmol/L   Glucose, Bld 179 (H) 65 - 99 mg/dL   BUN 20 6 - 20 mg/dL   Creatinine, Ser 1.61 0.44 - 1.00 mg/dL   Calcium 9.8 8.9 - 09.6 mg/dL   GFR calc non Af Amer >60 >60 mL/min   GFR calc Af Amer >60 >60 mL/min   Anion gap 10 5 - 15  Glucose, capillary     Status: Abnormal   Collection Time: 02/17/16  6:09 AM  Result Value Ref Range   Glucose-Capillary 185 (H) 65 - 99 mg/dL    Intake/Output Summary (Last 24 hours) at 02/17/16 0817 Last data filed at 02/16/16 2000  Gross per 24 hour  Intake    512 ml   Output      0 ml  Net    512 ml    CATH  Ost Cx to Prox Cx lesion, 20% stenosed.  Ost LAD lesion, 50% stenosed.  2nd Mrg lesion, 40% stenosed.  Ost 3rd Mrg to 3rd Mrg lesion, 50% stenosed.  Ost 2nd Diag to 2nd Diag lesion, 70% stenosed.  Mid RCA lesion, 85% stenosed.  Dist RCA lesion, 30% stenosed.  Ost RPDA to RPDA lesion, 50% stenosed.  Mid LAD to Dist LAD lesion, 99% stenosed. Post intervention, there is a 0% residual stenosis.  Ost 1st Diag to 1st Diag lesion, 99% stenosed. Post intervention, there is a 40% residual stenosis.  1. Severe two-vessel coronary artery disease involving the mid to distal LAD, large first diagonal and mid right coronary artery. 2. Moderately elevated left ventricular end-diastolic pressure with an LVEDP of 22 mmHg. Left ventricular angiography was not performed due to recent renal failure and known EF by echo. 3.Successful angioplasty and drug-eluting stent placement to the mid  LAD and balloon angioplasty of the first diagonal.  Recommendations: Dual antiplatelet therapy for at least one year. Staged RCA PCI in 3-4 weeks.  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 Avoid the right radial artery catheterization due to dual radial system.   ASSESSMENT AND PLAN:  ACUTE ON CHRONIC DIASTOLIC HF:    Euvolemic.    NSTEMI:    Results as above.  Continue DAPT.    Would be OK to discharge from our standpoint.   HTN:    Increased lisinopril to 20 mg daily.   BP improved  CM:  EF is mildly reduced.  Question of thrombus or mass on the mitral.  However, she has a difficult time swallowing and might be higher risk for TEE.  No plans for TEE at this point.   CKD:  Creat is improved.    Fayrene Fearing [facility] 02/17/2016 8:17 AM

## 2016-02-17 NOTE — Discharge Summary (Addendum)
Physician Discharge Summary  [name] NTI:[id number] DOB: [birth year] DOA: 02/10/2016  PCP: Lupita Dawn, MD  Admit date: 02/10/2016 Discharge date: 02/17/2016  Recommendations for Outpatient Follow-up:  1. Pt will need to follow up with PCP in 2-3 weeks post discharge 2. Please obtain BMP to evaluate electrolytes and kidney function 3. Please also check CBC to evaluate Hg and Hct levels 4. Dual antiplatelet therapy for at least one year. Staged RCA PCI in 3-4 weeks.  5. Please note that cardiology team cleared for d/c, pt was unable to tolerate TEE.   Discharge Diagnoses:  Active Problems:   Required emergent intubation   Acute respiratory failure (HCC)   Flash pulmonary edema (HCC)   NSTEMI (non-ST elevated myocardial infarction) ([place])   Hypertensive heart disease with heart failure ([place])  Discharge Condition: Stable  Diet recommendation: Heart healthy diet discussed in details   History of present illness:   50 year old female with a past medical history of HFpEF, HTN, DM II, Obesity presenting with acute onset shortness of breath.   On arrival to the ED she was obtunded on CPAP from EMS. She was hypoxic and placed on BIPAP and started on a nitroglycerin infusion. Bedside ultrasound done in the ED showed no evidence of pericardial effuison and depressed left ventricular EF. IVC dynamics were consistent with volume overload. Patient had declining mental status and acute onset respiratory distress requiring intubation likely secondary to flash pulmonary edema with possible underlying pneumonia.. Tolerated wean- 4/10 then extubated. Also NSTEMI with trop peaked at 7. Cards consulted, ECHO with no regional wall motion abnormality.  Assessment and plan Acute Hypoxemic hypercapneic Respiratory Failure Bilateral Pulmonary Edema Possible CAP Initially admitted for flash pulmonary edema, resolved, appears euvolemic at this time Tolerated wean, extubated to Nasal Canula on  4/10. Antibiotics d/c improved chest xray findings with diuresis Cardiology cleared for d/c, pt to follow up in an outpatient setting as she was unable to tolerate TEE  NSTEMI/demand ischemia, Trop - peaked at 7 Flash pulmonary edema due to HTN HFpEF - ECHO 02/11/2016 EF 86-76%, G2 Diastolic Dysfunction, EF- 4/10- reduced  Abnormal TTE, possible MV lesion / mass.  Cardiac cath -Successful angioplasty and drug-eluting stent placement to the mid LAD and balloon angioplasty of the first diagonal Recommend Dual antiplatelet therapy for at least one year. Staged RCA PCI in 3-4 weeks Cont home Bp meds Unable to tolerate TEE  Left Bundle branch block patient developed left bundle branch block after engaging the left ventricle per cardiology. This typically resolves within 24 hours  Pulm edema Hypokalemia AKI D/c Iv lasix 60m BID,  K stable   GI Ulcer PPx Tolerating diet well    Leukocytosis Possible CAP on Chest xray D/ c Ceftriaxone and Azithromycin -4/7-4/10 Trach aspirate- normal flora  Diabetes mellitus with long term use of insulin  - continue home medical regimen   Code Status:   Start   Ordered   02/11/16 0000  Full code Continuous    02/10/16 2359    Family Communication: Discussed in detail with the patient's daughter and patient , all imaging results, lab results explained to the patient   Disposition Plan: As per cardiology,  Clear for d/c today        Procedures/Studies: Dg Chest 2 View  02/16/2016  CLINICAL DATA:  Cough and congestion for 1 week. EXAM: CHEST  2 VIEW COMPARISON:  02/13/2016 chest radiograph. FINDINGS: Partially visualized surgical hardware from ACDF overlying the lower cervical spine. Stable cardiomediastinal  silhouette with mild cardiomegaly. No pneumothorax. No pleural effusion. Lungs appear clear, with no acute consolidative airspace disease and no pulmonary edema. IMPRESSION: Stable mild cardiomegaly without  pulmonary edema. No active pulmonary disease. Electronically Signed   By: Ilona Sorrel M.D.   On: 02/16/2016 13:09   Dg Chest Port 1 View  02/13/2016  CLINICAL DATA:  Pulmonary edema. EXAM: PORTABLE CHEST 1 VIEW COMPARISON:  02/2016. FINDINGS: Endotracheal tube and NG tube in stable position. Cardiomegaly with pulmonary infiltrates with pulmonary edema. Interim partial clearing from prior exam. No significant pleural effusion. No pneumothorax. IMPRESSION: 1. Lines and tubes in stable position. 2. Cardiomegaly with bilateral pulmonary infiltrates consistent with pulmonary edema. Partial clearing from prior exam. Electronically Signed   By: Marcello Moores  Register   On: 02/13/2016 07:02   Dg Chest Portable 1 View  02/10/2016  CLINICAL DATA:  Respiratory failure and status post intubation. EXAM: PORTABLE CHEST 1 VIEW COMPARISON:  11/16/2013 FINDINGS: Endotracheal tube present with the tip approximately 3 cm above the carina. Lungs show diffuse pulmonary edema bilaterally. There may be additional component of right lung pneumonia. No pneumothorax. No significant pleural fluid identified. The heart is mildly enlarged. IMPRESSION: Endotracheal tube tip approximately 3 cm above the carina. Diffuse pulmonary edema present. Potential additional component of right lung pneumonia. Electronically Signed   By: Aletta Edouard M.D.   On: 02/10/2016 19:02    Discharge Exam: Filed Vitals:   02/17/16 0339 02/17/16 0910  BP: 116/48 150/62  Pulse: 63 91  Temp: 97.4 F (36.3 C) 97.7 F (36.5 C)  Resp: 17 18   Filed Vitals:   02/16/16 1610 02/16/16 1915 02/17/16 0339 02/17/16 0910  BP: 140/69 148/76 116/48 150/62  Pulse: 66 91 63 91  Temp: 98.3 F (36.8 C) 98.1 F (36.7 C) 97.4 F (36.3 C) 97.7 F (36.5 C)  TempSrc: Oral Oral Oral Oral  Resp: 22 14 17 18   Height:      Weight:   107 kg (235 lb 14.3 oz)   SpO2: 97% 100% 100% 100%    General: Pt is alert, follows commands appropriately, not in acute  distress Cardiovascular: Regular rate and rhythm, no rubs, no gallops Respiratory: mild exp wheezing, no accessory muscle use, diminished breath sounds at bases   Discharge Instructions  Discharge Instructions    AMB Referral to Cardiac Rehabilitation - Phase II    Complete by:  As directed   Diagnosis:   NSTEMI PTCA       Diet - low sodium heart healthy    Complete by:  As directed      Increase activity slowly    Complete by:  As directed             Medication List    STOP taking these medications        nitroGLYCERIN 0.4 MG SL tablet  Commonly known as:  NITROSTAT     pregabalin 50 MG capsule  Commonly known as:  LYRICA     ranitidine 150 MG tablet  Commonly known as:  ZANTAC      TAKE these medications        acetaminophen 325 MG tablet  Commonly known as:  TYLENOL  Take 2 tablets (650 mg total) by mouth every 6 (six) hours as needed for mild pain (or Fever >/= 101).     aspirin 81 MG EC tablet  Take 1 tablet (81 mg total) by mouth daily.     atorvastatin 40 MG tablet  Commonly  known as:  LIPITOR  Take 1 tablet (40 mg total) by mouth daily at 6 PM.     blood glucose meter kit and supplies Kit  Dispense based on patient and insurance preference. Use up to four times daily as directed. (FOR ICD-9 250.00, 250.01).     carvedilol 6.25 MG tablet  Commonly known as:  COREG  Take 1 tablet (6.25 mg total) by mouth 2 (two) times daily with a meal.     insulin glargine 100 UNIT/ML injection  Commonly known as:  LANTUS  Inject 0.1 mLs (10 Units total) into the skin at bedtime.     lisinopril 20 MG tablet  Commonly known as:  PRINIVIL,ZESTRIL  Take 1 tablet (20 mg total) by mouth daily.     metFORMIN 500 MG tablet  Commonly known as:  GLUCOPHAGE  Take 1 tablet (500 mg total) by mouth 2 (two) times daily with a meal.     ticagrelor 90 MG Tabs tablet  Commonly known as:  BRILINTA  Take 1 tablet (90 mg total) by mouth 2 (two) times daily.            Follow-up Information    Follow up with Lupita Dawn, MD.   Specialty:  Family Medicine   Contact information:   [place] [phone]       Call Faye Ramsay, MD.   Specialty:  Internal Medicine   Why:  As needed   Contact information:   [address] [place]  [address] [phone]        The results of significant diagnostics from this hospitalization (including imaging, microbiology, ancillary and laboratory) are listed below for reference.     Microbiology: Recent Results (from the past 240 hour(s))  Culture, respiratory (NON-Expectorated)     Status: None   Collection Time: 02/10/16 11:46 PM  Result Value Ref Range Status   Specimen Description NASOPHARYNGEAL  Final   Special Requests Normal  Final   Gram Stain   Final    FEW WBC PRESENT,BOTH PMN AND MONONUCLEAR NO SQUAMOUS EPITHELIAL CELLS SEEN FEW GRAM POSITIVE COCCI IN PAIRS Performed at [facility]    Culture   Final    NORMAL OROPHARYNGEAL FLORA Performed at [facility]    Report Status 02/13/2016 FINAL  Final  MRSA PCR Screening     Status: None   Collection Time: 02/10/16 11:46 PM  Result Value Ref Range Status   MRSA by PCR NEGATIVE NEGATIVE Final    Comment:        The GeneXpert MRSA Assay (FDA approved for NASAL specimens only), is one component of a comprehensive MRSA colonization surveillance program. It is not intended to diagnose MRSA infection nor to guide or monitor treatment for MRSA infections.   Culture, blood (routine x 2)     Status: None   Collection Time: 02/10/16 11:55 PM  Result Value Ref Range Status   Specimen Description BLOOD RIGHT HAND  Final   Special Requests IN PEDIATRIC BOTTLE 3ML  Final   Culture NO GROWTH 5 DAYS  Final   Report Status 02/16/2016 FINAL  Final  Culture, blood (routine x 2)     Status: None   Collection Time: 02/11/16 12:05 AM  Result Value Ref Range Status    Specimen Description BLOOD LEFT HAND  Final   Special Requests IN PEDIATRIC BOTTLE 3ML  Final   Culture NO GROWTH 5 DAYS  Final   Report  Status 02/16/2016 FINAL  Final     Labs: Basic Metabolic Panel:  Recent Labs Lab 02/11/16 1125  02/12/16 0250 02/12/16 2352 02/13/16 0740 02/14/16 0216 02/15/16 1103 02/15/16 1713 02/16/16 0339 02/17/16 0325  NA  --   --  137 136 136 136 135  --  134* 136  K  --   < > 3.6 3.2* 3.2* 3.6 3.8  --  4.1 3.9  CL  --   --  95* 94* 94* 94* 97*  --  99* 101  CO2  --   --  29 28 29 29 26   --  24 25  GLUCOSE  --   --  228* 228* 190* 192* 172*  --  217* 179*  BUN  --   --  10 12 14  26* 31*  --  22* 20  CREATININE  --   --  1.00 0.84 0.78 1.48* 1.02* 0.89 0.85 0.90  CALCIUM  --   --  8.9 9.2 9.5 9.5 9.8  --  9.4 9.8  MG 1.9  --  1.7 1.8 1.8  --   --   --   --   --   PHOS 3.7  --  3.3 2.4* 3.1  --   --   --   --   --   < > = values in this interval not displayed. Liver Function Tests:  Recent Labs Lab 02/12/16 2352 02/15/16 0812 02/16/16 0339  AST 40 28 28  ALT 21 20 22   ALKPHOS 68 60 57  BILITOT 1.5* 0.7 1.0  PROT 6.8 6.1* 6.1*  ALBUMIN 3.1* 2.8* 3.0*   No results for input(s): LIPASE, AMYLASE in the last 168 hours. No results for input(s): AMMONIA in the last 168 hours. CBC:  Recent Labs Lab 02/10/16 1839  02/12/16 0250 02/12/16 2352 02/14/16 0216 02/15/16 1713 02/16/16 0339  WBC 22.4*  < > 15.6* 12.7* 15.4* 9.0 10.3  NEUTROABS 14.2*  --   --   --   --   --   --   HGB 14.3  < > 12.2 12.1 11.9* 12.4 12.3  HCT 47.2*  < > 39.3 40.0 39.3 40.0 41.6  MCV 81.2  < > 79.7 78.9 78.9 77.4* 79.4  PLT 297  < > 170 166 231 195 228  < > = values in this interval not displayed. Cardiac Enzymes:  Recent Labs Lab 02/10/16 2355 02/11/16 0607 02/12/16 1242 02/12/16 2012 02/12/16 2352  TROPONINI 1.01* 7.20* 5.20* 4.84* 4.17*   BNP: BNP (last 3 results)  Recent Labs  02/10/16 1838  BNP 227.6*    ProBNP (last 3 results) No results  for input(s): PROBNP in the last 8760 hours.  CBG:  Recent Labs Lab 02/16/16 0804 02/16/16 1133 02/16/16 1658 02/16/16 2100 02/17/16 0609  GLUCAP 192* 158* 195* 208* 185*     SIGNED: Time coordinating discharge: Over 30 minutes  MAGICK-Erron Wengert, MD  [facility] 02/17/2016, 10:30 AM Pager [phone]  If 7PM-7AM, please contact night-coverage [email or web address] Password [email or web address]

## 2016-02-17 NOTE — Progress Notes (Signed)
CARDIAC REHAB PHASE I   PRE:  Rate/Rhythm: 74 SR  BP:  Supine:   Sitting: 153/72  Standing:    SaO2: 99%RA  MODE:  Ambulation: 300 ft   POST:  Rate/Rhythm: 97 SR  BP:  Supine:   Sitting: 150/62  Standing:    SaO2: 98%RA 0850-0920 Pt walked 300 ft on RA with rolling walker independently. Pt does not think she will need walker for home. No CP. Does have audible wheezing.  Pt remembered when to call MD with weight gain. Scales could not be provided but pt stated should be able to buy. No questions re ed done yesterday. Was watching diabetic videos.   Luetta Nutting, RN BSN  02/17/2016 9:15 AM

## 2016-02-19 ENCOUNTER — Inpatient Hospital Stay (HOSPITAL_COMMUNITY): Payer: BLUE CROSS/BLUE SHIELD

## 2016-02-19 ENCOUNTER — Encounter (HOSPITAL_COMMUNITY): Payer: Self-pay | Admitting: Emergency Medicine

## 2016-02-19 ENCOUNTER — Emergency Department (HOSPITAL_COMMUNITY): Payer: BLUE CROSS/BLUE SHIELD

## 2016-02-19 ENCOUNTER — Inpatient Hospital Stay (HOSPITAL_COMMUNITY)
Admission: EM | Admit: 2016-02-19 | Discharge: 2016-02-25 | DRG: 207 | Disposition: A | Discharge status: Home / Self Care | Payer: BLUE CROSS/BLUE SHIELD | Attending: Internal Medicine | Admitting: Internal Medicine

## 2016-02-19 DIAGNOSIS — T380X5A Adverse effect of glucocorticoids and synthetic analogues, initial encounter: Secondary | ICD-10-CM | POA: Diagnosis present

## 2016-02-19 DIAGNOSIS — G4733 Obstructive sleep apnea (adult) (pediatric): Secondary | ICD-10-CM | POA: Diagnosis not present

## 2016-02-19 DIAGNOSIS — I110 Hypertensive heart disease with heart failure: Secondary | ICD-10-CM | POA: Diagnosis present

## 2016-02-19 DIAGNOSIS — I214 Non-ST elevation (NSTEMI) myocardial infarction: Secondary | ICD-10-CM | POA: Diagnosis present

## 2016-02-19 DIAGNOSIS — Z6841 Body Mass Index (BMI) 40.0 and over, adult: Secondary | ICD-10-CM | POA: Diagnosis not present

## 2016-02-19 DIAGNOSIS — E872 Acidosis: Secondary | ICD-10-CM | POA: Diagnosis present

## 2016-02-19 DIAGNOSIS — E1165 Type 2 diabetes mellitus with hyperglycemia: Secondary | ICD-10-CM

## 2016-02-19 DIAGNOSIS — E785 Hyperlipidemia, unspecified: Secondary | ICD-10-CM | POA: Diagnosis present

## 2016-02-19 DIAGNOSIS — I5042 Chronic combined systolic (congestive) and diastolic (congestive) heart failure: Secondary | ICD-10-CM | POA: Diagnosis present

## 2016-02-19 DIAGNOSIS — I10 Essential (primary) hypertension: Secondary | ICD-10-CM | POA: Diagnosis not present

## 2016-02-19 DIAGNOSIS — R061 Stridor: Secondary | ICD-10-CM | POA: Diagnosis present

## 2016-02-19 DIAGNOSIS — Z794 Long term (current) use of insulin: Secondary | ICD-10-CM | POA: Diagnosis not present

## 2016-02-19 DIAGNOSIS — Z981 Arthrodesis status: Secondary | ICD-10-CM

## 2016-02-19 DIAGNOSIS — D72829 Elevated white blood cell count, unspecified: Secondary | ICD-10-CM | POA: Diagnosis present

## 2016-02-19 DIAGNOSIS — Z79899 Other long term (current) drug therapy: Secondary | ICD-10-CM | POA: Diagnosis not present

## 2016-02-19 DIAGNOSIS — Z955 Presence of coronary angioplasty implant and graft: Secondary | ICD-10-CM | POA: Diagnosis not present

## 2016-02-19 DIAGNOSIS — J386 Stenosis of larynx: Secondary | ICD-10-CM | POA: Diagnosis present

## 2016-02-19 DIAGNOSIS — I2510 Atherosclerotic heart disease of native coronary artery without angina pectoris: Secondary | ICD-10-CM

## 2016-02-19 DIAGNOSIS — E662 Morbid (severe) obesity with alveolar hypoventilation: Secondary | ICD-10-CM | POA: Diagnosis present

## 2016-02-19 DIAGNOSIS — J9601 Acute respiratory failure with hypoxia: Secondary | ICD-10-CM | POA: Diagnosis not present

## 2016-02-19 DIAGNOSIS — I059 Rheumatic mitral valve disease, unspecified: Secondary | ICD-10-CM | POA: Diagnosis not present

## 2016-02-19 DIAGNOSIS — I447 Left bundle-branch block, unspecified: Secondary | ICD-10-CM | POA: Diagnosis present

## 2016-02-19 DIAGNOSIS — N179 Acute kidney failure, unspecified: Secondary | ICD-10-CM | POA: Diagnosis present

## 2016-02-19 DIAGNOSIS — Z01818 Encounter for other preprocedural examination: Secondary | ICD-10-CM

## 2016-02-19 DIAGNOSIS — Z7982 Long term (current) use of aspirin: Secondary | ICD-10-CM

## 2016-02-19 DIAGNOSIS — Z4659 Encounter for fitting and adjustment of other gastrointestinal appliance and device: Secondary | ICD-10-CM | POA: Diagnosis not present

## 2016-02-19 DIAGNOSIS — F419 Anxiety disorder, unspecified: Secondary | ICD-10-CM | POA: Diagnosis present

## 2016-02-19 DIAGNOSIS — R0602 Shortness of breath: Secondary | ICD-10-CM | POA: Diagnosis present

## 2016-02-19 DIAGNOSIS — I058 Other rheumatic mitral valve diseases: Secondary | ICD-10-CM | POA: Insufficient documentation

## 2016-02-19 DIAGNOSIS — E118 Type 2 diabetes mellitus with unspecified complications: Secondary | ICD-10-CM | POA: Diagnosis not present

## 2016-02-19 DIAGNOSIS — Z7984 Long term (current) use of oral hypoglycemic drugs: Secondary | ICD-10-CM | POA: Diagnosis not present

## 2016-02-19 DIAGNOSIS — J9621 Acute and chronic respiratory failure with hypoxia: Secondary | ICD-10-CM | POA: Diagnosis present

## 2016-02-19 HISTORY — DX: Atherosclerotic heart disease of native coronary artery without angina pectoris: I25.10

## 2016-02-19 HISTORY — DX: Chronic combined systolic (congestive) and diastolic (congestive) heart failure: I50.42

## 2016-02-19 HISTORY — DX: Stridor: R06.1

## 2016-02-19 LAB — CBC
HCT: 43.1 % (ref 36.0–46.0)
HEMOGLOBIN: 13.3 g/dL (ref 12.0–15.0)
MCH: 24.4 pg — ABNORMAL LOW (ref 26.0–34.0)
MCHC: 30.9 g/dL (ref 30.0–36.0)
MCV: 79.1 fL (ref 78.0–100.0)
PLATELETS: 215 10*3/uL (ref 150–400)
RBC: 5.45 MIL/uL — ABNORMAL HIGH (ref 3.87–5.11)
RDW: 16.7 % — AB (ref 11.5–15.5)
WBC: 16.5 10*3/uL — ABNORMAL HIGH (ref 4.0–10.5)

## 2016-02-19 LAB — STREP PNEUMONIAE URINARY ANTIGEN: Strep Pneumo Urinary Antigen: NEGATIVE

## 2016-02-19 LAB — CBC WITH DIFFERENTIAL/PLATELET
BASOS ABS: 0 10*3/uL (ref 0.0–0.1)
Basophils Relative: 0 %
Eosinophils Absolute: 0.2 10*3/uL (ref 0.0–0.7)
Eosinophils Relative: 1 %
HEMATOCRIT: 43.2 % (ref 36.0–46.0)
Hemoglobin: 13.1 g/dL (ref 12.0–15.0)
LYMPHS ABS: 2.6 10*3/uL (ref 0.7–4.0)
LYMPHS PCT: 16 %
MCH: 23.9 pg — AB (ref 26.0–34.0)
MCHC: 30.3 g/dL (ref 30.0–36.0)
MCV: 79 fL (ref 78.0–100.0)
MONO ABS: 1.3 10*3/uL — AB (ref 0.1–1.0)
MONOS PCT: 7 %
NEUTROS ABS: 12.7 10*3/uL — AB (ref 1.7–7.7)
Neutrophils Relative %: 76 %
Platelets: 232 10*3/uL (ref 150–400)
RBC: 5.47 MIL/uL — ABNORMAL HIGH (ref 3.87–5.11)
RDW: 16.7 % — AB (ref 11.5–15.5)
WBC: 16.8 10*3/uL — ABNORMAL HIGH (ref 4.0–10.5)

## 2016-02-19 LAB — COMPREHENSIVE METABOLIC PANEL
ALT: 27 U/L (ref 14–54)
AST: 29 U/L (ref 15–41)
Albumin: 3.5 g/dL (ref 3.5–5.0)
Alkaline Phosphatase: 73 U/L (ref 38–126)
Anion gap: 13 (ref 5–15)
BUN: 20 mg/dL (ref 6–20)
CHLORIDE: 98 mmol/L — AB (ref 101–111)
CO2: 22 mmol/L (ref 22–32)
Calcium: 9.8 mg/dL (ref 8.9–10.3)
Creatinine, Ser: 0.93 mg/dL (ref 0.44–1.00)
GFR calc Af Amer: >60 mL/min (ref >60)
Glucose, Bld: 304 mg/dL — ABNORMAL HIGH (ref 65–99)
POTASSIUM: 4.9 mmol/L (ref 3.5–5.1)
Sodium: 133 mmol/L — ABNORMAL LOW (ref 135–145)
Total Bilirubin: 1.1 mg/dL (ref 0.3–1.2)
Total Protein: 7.3 g/dL (ref 6.5–8.1)

## 2016-02-19 LAB — RAPID URINE DRUG SCREEN, HOSP PERFORMED
AMPHETAMINES: NOT DETECTED
BENZODIAZEPINES: NOT DETECTED
Barbiturates: NOT DETECTED
COCAINE: NOT DETECTED
OPIATES: NOT DETECTED
TETRAHYDROCANNABINOL: POSITIVE — AB

## 2016-02-19 LAB — POCT I-STAT 3, ART BLOOD GAS (G3+)
ACID-BASE EXCESS: 3 mmol/L — AB (ref 0.0–2.0)
Bicarbonate: 30.8 mEq/L — ABNORMAL HIGH (ref 20.0–24.0)
O2 Saturation: 100 %
PO2 ART: 243 mmHg — AB (ref 80.0–100.0)
Patient temperature: 98
TCO2: 33 mmol/L (ref 0–100)
pCO2 arterial: 58.7 mmHg (ref 35.0–45.0)
pH, Arterial: 7.326 — ABNORMAL LOW (ref 7.350–7.450)

## 2016-02-19 LAB — LACTIC ACID, PLASMA: LACTIC ACID, VENOUS: 1.9 mmol/L (ref 0.5–2.0)

## 2016-02-19 LAB — I-STAT CHEM 8, ED
BUN: 22 mg/dL — AB (ref 6–20)
CALCIUM ION: 1.18 mmol/L (ref 1.12–1.23)
CHLORIDE: 98 mmol/L — AB (ref 101–111)
Creatinine, Ser: 0.8 mg/dL (ref 0.44–1.00)
GLUCOSE: 269 mg/dL — AB (ref 65–99)
HCT: 47 % — ABNORMAL HIGH (ref 36.0–46.0)
Hemoglobin: 16 g/dL — ABNORMAL HIGH (ref 12.0–15.0)
Potassium: 4.7 mmol/L (ref 3.5–5.1)
Sodium: 136 mmol/L (ref 135–145)
TCO2: 26 mmol/L (ref 0–100)

## 2016-02-19 LAB — I-STAT VENOUS BLOOD GAS, ED
ACID-BASE EXCESS: 10 mmol/L — AB (ref 0.0–2.0)
Bicarbonate: 34.4 mEq/L — ABNORMAL HIGH (ref 20.0–24.0)
O2 SAT: 97 %
TCO2: 36 mmol/L (ref 0–100)
pCO2, Ven: 45.5 mmHg (ref 45.0–50.0)
pH, Ven: 7.487 — ABNORMAL HIGH (ref 7.250–7.300)
pO2, Ven: 83 mmHg — ABNORMAL HIGH (ref 31.0–45.0)

## 2016-02-19 LAB — APTT: aPTT: 27 seconds (ref 24–37)

## 2016-02-19 LAB — PHOSPHORUS: PHOSPHORUS: 3.9 mg/dL (ref 2.5–4.6)

## 2016-02-19 LAB — BASIC METABOLIC PANEL
ANION GAP: 9 (ref 5–15)
BUN: 20 mg/dL (ref 6–20)
CHLORIDE: 97 mmol/L — AB (ref 101–111)
CO2: 26 mmol/L (ref 22–32)
Calcium: 9.7 mg/dL (ref 8.9–10.3)
Creatinine, Ser: 0.97 mg/dL (ref 0.44–1.00)
GFR calc Af Amer: >60 mL/min (ref >60)
GLUCOSE: 238 mg/dL — AB (ref 65–99)
Sodium: 132 mmol/L — ABNORMAL LOW (ref 135–145)

## 2016-02-19 LAB — MAGNESIUM: MAGNESIUM: 2.3 mg/dL (ref 1.7–2.4)

## 2016-02-19 LAB — GLUCOSE, CAPILLARY: GLUCOSE-CAPILLARY: 341 mg/dL — AB (ref 65–99)

## 2016-02-19 LAB — PROCALCITONIN

## 2016-02-19 LAB — PROTIME-INR
INR: 1.22 (ref 0.00–1.49)
PROTHROMBIN TIME: 15.5 s — AB (ref 11.6–15.2)

## 2016-02-19 LAB — TROPONIN I: Troponin I: 0.86 ng/mL (ref <0.031)

## 2016-02-19 LAB — CORTISOL: CORTISOL PLASMA: 12 ug/dL

## 2016-02-19 MED ORDER — FENTANYL CITRATE (PF) 100 MCG/2ML IJ SOLN: 100.0000 ug | INTRAMUSCULAR | Status: DC | PRN

## 2016-02-19 MED ORDER — ETOMIDATE 2 MG/ML IV SOLN
40.0000 mg | Freq: Once | INTRAVENOUS | Status: AC
  Administered 2016-02-19: 40 mg via INTRAVENOUS

## 2016-02-19 MED ORDER — INSULIN ASPART 100 UNIT/ML ~~LOC~~ SOLN: 2.0000 [IU] | SUBCUTANEOUS | Status: DC

## 2016-02-19 MED ORDER — PANTOPRAZOLE SODIUM 40 MG IV SOLR
40.0000 mg | Freq: Two times a day (BID) | INTRAVENOUS | Status: DC
  Administered 2016-02-19 (×2): 40 mg via INTRAVENOUS
  Filled 2016-02-19 (×2): qty 40

## 2016-02-19 MED ORDER — FENTANYL CITRATE (PF) 100 MCG/2ML IJ SOLN
25.0000 ug | INTRAMUSCULAR | Status: DC | PRN
  Administered 2016-02-19: 100 ug via INTRAVENOUS
  Filled 2016-02-19 (×2): qty 2

## 2016-02-19 MED ORDER — RACEPINEPHRINE HCL 2.25 % IN NEBU
INHALATION_SOLUTION | RESPIRATORY_TRACT | Status: AC
  Filled 2016-02-19: qty 0.5

## 2016-02-19 MED ORDER — MIDAZOLAM HCL 2 MG/2ML IJ SOLN: 2.0000 mg | INTRAMUSCULAR | Status: DC | PRN

## 2016-02-19 MED ORDER — HEPARIN SODIUM (PORCINE) 5000 UNIT/ML IJ SOLN
5000.0000 [IU] | Freq: Three times a day (TID) | INTRAMUSCULAR | Status: DC
  Administered 2016-02-19 – 2016-02-25 (×19): 5000 [IU] via SUBCUTANEOUS
  Filled 2016-02-19 (×19): qty 1

## 2016-02-19 MED ORDER — RACEPINEPHRINE HCL 2.25 % IN NEBU
0.5000 mL | INHALATION_SOLUTION | RESPIRATORY_TRACT | Status: DC | PRN
  Filled 2016-02-19 (×2): qty 0.5

## 2016-02-19 MED ORDER — ALPRAZOLAM 0.25 MG PO TABS: 0.2500 mg | ORAL_TABLET | Freq: Two times a day (BID) | ORAL | Status: DC | PRN

## 2016-02-19 MED ORDER — PROPOFOL 1000 MG/100ML IV EMUL
INTRAVENOUS | Status: AC
  Administered 2016-02-19: 100 mg
  Filled 2016-02-19: qty 100

## 2016-02-19 MED ORDER — FAMOTIDINE IN NACL 20-0.9 MG/50ML-% IV SOLN
20.0000 mg | Freq: Two times a day (BID) | INTRAVENOUS | Status: DC
  Administered 2016-02-19 – 2016-02-20 (×4): 20 mg via INTRAVENOUS
  Filled 2016-02-19 (×4): qty 50

## 2016-02-19 MED ORDER — MIDAZOLAM HCL 2 MG/2ML IJ SOLN
2.0000 mg | INTRAMUSCULAR | Status: DC | PRN
  Administered 2016-02-20 – 2016-02-22 (×2): 2 mg via INTRAVENOUS
  Filled 2016-02-19 (×2): qty 2

## 2016-02-19 MED ORDER — MIDAZOLAM HCL 2 MG/2ML IJ SOLN
INTRAMUSCULAR | Status: AC
Start: 2016-02-19 — End: 2016-02-19
  Administered 2016-02-19: 2 mg
  Filled 2016-02-19: qty 2

## 2016-02-19 MED ORDER — SODIUM CHLORIDE 0.9 % IV SOLN
25.0000 ug/h | INTRAVENOUS | Status: DC
  Administered 2016-02-19 – 2016-02-22 (×3): 100 ug/h via INTRAVENOUS
  Filled 2016-02-19 (×4): qty 50

## 2016-02-19 MED ORDER — MIDAZOLAM HCL 2 MG/2ML IJ SOLN
INTRAMUSCULAR | Status: AC
  Administered 2016-02-19: 2 mg
  Filled 2016-02-19: qty 2

## 2016-02-19 MED ORDER — CETYLPYRIDINIUM CHLORIDE 0.05 % MT LIQD
7.0000 mL | Freq: Two times a day (BID) | OROMUCOSAL | Status: DC
  Administered 2016-02-20 – 2016-02-22 (×4): 7 mL via OROMUCOSAL

## 2016-02-19 MED ORDER — FENTANYL CITRATE (PF) 100 MCG/2ML IJ SOLN
100.0000 ug | INTRAMUSCULAR | Status: DC | PRN
  Administered 2016-02-22: 50 ug via INTRAVENOUS
  Filled 2016-02-19: qty 2

## 2016-02-19 MED ORDER — ACETAMINOPHEN 325 MG PO TABS
650.0000 mg | ORAL_TABLET | ORAL | Status: DC | PRN
  Administered 2016-02-21: 650 mg via ORAL
  Filled 2016-02-19: qty 2

## 2016-02-19 MED ORDER — METHYLPREDNISOLONE SODIUM SUCC 40 MG IJ SOLR
40.0000 mg | Freq: Two times a day (BID) | INTRAMUSCULAR | Status: DC
  Administered 2016-02-19 – 2016-02-24 (×10): 40 mg via INTRAVENOUS
  Filled 2016-02-19 (×10): qty 1

## 2016-02-19 MED ORDER — NITROGLYCERIN 2 % TD OINT
1.0000 [in_us] | TOPICAL_OINTMENT | Freq: Once | TRANSDERMAL | Status: AC
  Administered 2016-02-19: 1 [in_us] via TOPICAL
  Filled 2016-02-19: qty 1

## 2016-02-19 MED ORDER — TICAGRELOR 90 MG PO TABS
90.0000 mg | ORAL_TABLET | Freq: Two times a day (BID) | ORAL | Status: DC
  Administered 2016-02-19 – 2016-02-25 (×12): 90 mg via ORAL
  Filled 2016-02-19 (×12): qty 1

## 2016-02-19 MED ORDER — ANTISEPTIC ORAL RINSE SOLUTION (CORINZ)
7.0000 mL | Freq: Four times a day (QID) | OROMUCOSAL | Status: DC
  Administered 2016-02-20 – 2016-02-23 (×15): 7 mL via OROMUCOSAL

## 2016-02-19 MED ORDER — RACEPINEPHRINE HCL 2.25 % IN NEBU
0.5000 mL | INHALATION_SOLUTION | Freq: Once | RESPIRATORY_TRACT | Status: AC
  Administered 2016-02-19: 0.5 mL via RESPIRATORY_TRACT

## 2016-02-19 MED ORDER — CHLORHEXIDINE GLUCONATE 0.12% ORAL RINSE (MEDLINE KIT)
15.0000 mL | Freq: Two times a day (BID) | OROMUCOSAL | Status: DC
  Administered 2016-02-19 – 2016-02-23 (×8): 15 mL via OROMUCOSAL

## 2016-02-19 MED ORDER — SODIUM CHLORIDE 0.9 % IV SOLN
INTRAVENOUS | Status: DC
  Administered 2016-02-20: 2.4 [IU]/h via INTRAVENOUS
  Filled 2016-02-19: qty 2.5

## 2016-02-19 MED ORDER — IOPAMIDOL (ISOVUE-300) INJECTION 61%
INTRAVENOUS | Status: AC
  Administered 2016-02-19: 75 mL
  Filled 2016-02-19: qty 75

## 2016-02-19 NOTE — Progress Notes (Signed)
eLink Physician-Brief Progress Note Patient Name: [name] DOB: 1966-01-28 MRN: [id number]   Date of Service  02/19/2016  HPI/Events of Note  Asked to review CXR and KUB for ETT and OGT placement. ETT is at the superior aspect of the clavicles and needs to be advanced 2 cm. OGT position is satisfactory in the distal stomach.  eICU Interventions  Will order: 1. Advance ETT 2 cm and re-secure.  2. Repeat KUB post reposition of ETT.      Intervention Category Intermediate Interventions: Diagnostic test evaluation  Lenell Antu 02/19/2016, 10:26 PM

## 2016-02-19 NOTE — Procedures (Signed)
Intubation Procedure Note [name] [id number] [birth year]  Procedure: Intubation Indications: Respiratory insufficiency  Procedure Details Consent: Risks of procedure as well as the alternatives and risks of each were explained to the (patient/caregiver).  Consent for procedure obtained. Time Out: Verified patient identification, verified procedure, site/side was marked, verified correct patient position, special equipment/implants available, medications/allergies/relevent history reviewed, required imaging and test results available.  Performed  Maximum sterile technique was used including gloves and mask.  Glidescope 4  Patient sedated with fentanyl, versed, then etomidate. Easy bag.  On first attempt, airway noted to be anterior, no swelling noted, difficulty feeding ETT with glidescope stylet, so Bougie advanced through the cords. As tube was being fed, Bougie was displaced. Attempt aborted.  Additional medication with 40mg  propofol and additional 20mg  etomidate.  2nd look again with good visualization, easy passage of Bougie, then easy passage of 7.0 ETT through the cords to a depth of 23 cm at the teeth. Bougie removed. + chest rise, good color change. +bilateral breath sounds. No sounds over the epigastrium.   Evaluation Hemodynamic Status: BP stable throughout; O2 sats: stable throughout Patient's Current Condition: stable Complications: No apparent complications; small lip laceration from patient biting down w/ very poor dentition.  Patient did tolerate procedure well. Chest X-ray ordered to verify placement.  CXR: pending.  Nita Sickle, MD Pulmonary and Critical Care 02/19/2016 9:06 PM

## 2016-02-19 NOTE — ED Notes (Signed)
RN transported pt to CT 

## 2016-02-19 NOTE — ED Notes (Signed)
Pt had stent and balloon placed this past Wednesday. Pt had sudden onset SOB. EMS gave 2mg  epi nebulizer and atrovent. Pt audibly wheezing. EMS gave 125mg  solu medrol and mag. Pt has LBBB on EKG. BP 214/108, HR 100, resp 20, 99% on neb. Denies pain

## 2016-02-19 NOTE — Progress Notes (Signed)
ABG done per order post intubation. RN to report results.

## 2016-02-19 NOTE — ED Notes (Signed)
Pt transported to CT with RN

## 2016-02-19 NOTE — Consult Note (Signed)
Reason for Consult: Stridor Referring Physician: Quintella Reichert, MD  [name] is an 50 y.o. female.  HPI: Patient was discharged from hospital couple days ago after being intubated for about 3 or 4 days for an acute cardiopulmonary event. She started noticing note noisy breathing the day before she was discharged. She never had problems prior to this hospitalization. Her problems got worse today and today she is finding it hard to breathe. She has been treated with breathing treatments and other medical treatment since arriving today and is doing a little bit better.  Past Medical History  Diagnosis Date  . Diabetes mellitus   . Hypertension     Does not see a cardiologist  . Sleep apnea     Uses CPAP  . Flash pulmonary edema ([place]) 02/10/2016    requiring intubation  . NSTEMI (non-ST elevation myocardial infarction) ([place]) 02/10/2016  . ARF (acute respiratory failure) ([place]) 02/10/2016    Past Surgical History  Procedure Laterality Date  . Breast surgery      cyst right breast  . Anterior cervical decomp/discectomy fusion  08/28/2012    Procedure: ANTERIOR CERVICAL DECOMPRESSION/DISCECTOMY FUSION 2 LEVELS;  Surgeon: Otilio Connors, MD;  Location: [facility];  Service: Neurosurgery;  Laterality: N/A;  Cervical four-five, cervical six-seven Anterior cervical decompression/diskectomy/fusion/LifeNet Bone/Trestle plate  . Cardiac catheterization N/A 02/15/2016    Procedure: Left Heart Cath and Coronary Angiography;  Surgeon: Wellington Hampshire, MD;  Location: [facility];  Service: Cardiovascular;  Laterality: N/A;  . Cardiac catheterization N/A 02/15/2016    Procedure: Coronary Stent Intervention;  Surgeon: Wellington Hampshire, MD;  Location: [facility];  Service: Cardiovascular;  Laterality: N/A;  . Cardiac catheterization N/A 02/15/2016    Procedure: Coronary Balloon Angioplasty;  Surgeon: Wellington Hampshire, MD;  Location: [facility];  Service: Cardiovascular;   Laterality: N/A;  . Coronary angioplasty with stent placement      Family History  Problem Relation Age of Onset  . Heart disease Paternal Grandmother 26  . Diabetes Paternal Grandmother   . Hypertension Paternal Grandmother   . Heart disease Paternal Aunt   . Cancer Neg Hx   . Stroke Neg Hx   . Alcohol abuse Neg Hx   . Heart disease Father 20    MI    Social History:  reports that she has never smoked. She has never used smokeless tobacco. She reports that she drinks alcohol. She reports that she uses illicit drugs (Marijuana).  Allergies: No Known Allergies  Medications: Reviewed  Results for orders placed or performed during the hospital encounter of 02/19/16 (from the past 48 hour(s))  Basic metabolic panel     Status: Abnormal   Collection Time: 02/19/16 11:26 AM  Result Value Ref Range   Sodium 132 (L) 135 - 145 mmol/L   Potassium >7.5 (HH) 3.5 - 5.1 mmol/L    Comment: HEMOLYSIS AT THIS LEVEL MAY AFFECT RESULT CRITICAL RESULT CALLED TO, READ BACK BY AND VERIFIED WITH: DOOLEYHRN [id number] MCCAULEG    Chloride 97 (L) 101 - 111 mmol/L   CO2 26 22 - 32 mmol/L   Glucose, Bld 238 (H) 65 - 99 mg/dL   BUN 20 6 - 20 mg/dL   Creatinine, Ser 0.97 0.44 - 1.00 mg/dL   Calcium 9.7 8.9 - 10.3 mg/dL   GFR calc non Af Amer >60 >60 mL/min   GFR calc Af Amer >60 >60 mL/min    Comment: (NOTE) The  eGFR has been calculated using the CKD EPI equation. This calculation has not been validated in all clinical situations. eGFR's persistently <60 mL/min signify possible Chronic Kidney Disease.    Anion gap 9 5 - 15  CBC with Differential     Status: Abnormal   Collection Time: 02/19/16 11:26 AM  Result Value Ref Range   WBC 16.8 (H) 4.0 - 10.5 K/uL   RBC 5.47 (H) 3.87 - 5.11 MIL/uL   Hemoglobin 13.1 12.0 - 15.0 g/dL   HCT 43.2 36.0 - 46.0 %   MCV 79.0 78.0 - 100.0 fL   MCH 23.9 (L) 26.0 - 34.0 pg   MCHC 30.3 30.0 - 36.0 g/dL   RDW 16.7 (H) 11.5 - 15.5 %   Platelets 232 150 -  400 K/uL   Neutrophils Relative % 76 %   Neutro Abs 12.7 (H) 1.7 - 7.7 K/uL   Lymphocytes Relative 16 %   Lymphs Abs 2.6 0.7 - 4.0 K/uL   Monocytes Relative 7 %   Monocytes Absolute 1.3 (H) 0.1 - 1.0 K/uL   Eosinophils Relative 1 %   Eosinophils Absolute 0.2 0.0 - 0.7 K/uL   Basophils Relative 0 %   Basophils Absolute 0.0 0.0 - 0.1 K/uL  Troponin I     Status: Abnormal   Collection Time: 02/19/16 11:26 AM  Result Value Ref Range   Troponin I 0.86 (HH) <0.031 ng/mL    Comment: CRITICAL RESULT CALLED TO, READ BACK BY AND VERIFIED WITH: DOOLEYHRN [id number] MCCAULEG        POSSIBLE MYOCARDIAL ISCHEMIA. SERIAL TESTING RECOMMENDED.   I-Stat venous blood gas, ED     Status: Abnormal   Collection Time: 02/19/16 11:39 AM  Result Value Ref Range   pH, Ven 7.487 (H) 7.250 - 7.300   pCO2, Ven 45.5 45.0 - 50.0 mmHg   pO2, Ven 83.0 (H) 31.0 - 45.0 mmHg   Bicarbonate 34.4 (H) 20.0 - 24.0 mEq/L   TCO2 36 0 - 100 mmol/L   O2 Saturation 97.0 %   Acid-Base Excess 10.0 (H) 0.0 - 2.0 mmol/L   Patient temperature HIDE    Sample type VENOUS     Dg Chest Port 1 View  02/19/2016  CLINICAL DATA:  Shortness of breath.  Wheezing.  Ex-smoker. EXAM: PORTABLE CHEST 1 VIEW COMPARISON:  02/16/2016. FINDINGS: Stable enlarged cardiac silhouette. Clear lungs. Thoracic spine degenerative changes. IMPRESSION: No acute abnormality.  Stable cardiomegaly. Electronically Signed   By: Claudie Revering M.D.   On: 02/19/2016 11:44    OKH:[id number] except as listed in admit H&P  Blood pressure 146/75, pulse 84, temperature 97.8 F (36.6 C), temperature source Axillary, resp. rate 17, last menstrual period 01/19/2016, SpO2 99 %.  PHYSICAL EXAM: Overall appearance:  Obese woman, sitting upright, with mild biphasic stridor. Head:  Normocephalic, atraumatic. Ears: External ears look clear. Nose: External nose is healthy in appearance. Internal nasal exam free of any lesions or obstruction. Oral Cavity/Pharynx:  There  are no mucosal lesions or masses identified. Larynx/Hypopharynx: Normal cord anatomy and mobility, supraglottis normal except for arytenoid edema. Subglottis not well visualized. Neuro:  No identifiable neurologic deficits. Neck: No palpable neck masses.  Studies Reviewed: none  Procedures: Flexible fiberoptic laryngoscopy. Topical Neo-Synephrine/Xylocaine was applied to the nasal cavities in spray form. The flexible fiberoptic scope was passed down the left nasal cavity and used to inspect the nasopharynx, oropharynx, hypopharynx and larynx. Findings are as noted above.   Assessment/Plan: Probable subglottic edema, possibly early stenosis  following recent intubation. She seems to be responding to medical therapy. CT will be performed today to further evaluate the subglottic airway. The upper airway down to the level of the glottis is clear. Observation in the intensive care would be an appropriate step. Airway intervention may include tracheostomy if she gets worse.  Sumner Kirchman 02/19/2016, 1:00 PM

## 2016-02-19 NOTE — H&P (Signed)
PULMONARY / CRITICAL CARE MEDICINE   Name: [name] MRN: [id number] DOB: December 15, 1965    ADMISSION DATE:  02/19/2016   REFERRING MD:EDP  CHIEF COMPLAINT:  STRIDOR  HISTORY OF PRESENT ILLNESS:   50 yo obese female just discharged  4/14 after being intubated for MI from LAD/diagonal blockage required stent and balloon angioplasty. She was extubated and on day of discharge she noted noisy breathing (documented in cards note) but was discharged to home. She reports her "noisy breathing became unbearable and she presents to ER\ with stridor. ENT Scoped her without obvious injury. Please note she has documented OSA with broken cpap machine x 3 years. Recent intubation, untreated OSA and ACE-I may be contributing factors to stridor. We will check CT neck, PPI bid, steroids and + or - abx. She may need intubation for airway protection.  PAST MEDICAL HISTORY :  She  has a past medical history of Diabetes mellitus; Hypertension; Sleep apnea; Flash pulmonary edema (HCC) (02/10/2016); NSTEMI (non-ST elevation myocardial infarction) ([place]) (02/10/2016); and ARF (acute respiratory failure) ([place]) (02/10/2016).  PAST SURGICAL HISTORY: She  has past surgical history that includes Breast surgery; Anterior cervical decomp/discectomy fusion (08/28/2012); Cardiac catheterization (N/A, 02/15/2016); Cardiac catheterization (N/A, 02/15/2016); Cardiac catheterization (N/A, 02/15/2016); and Coronary angioplasty with stent.  No Known Allergies  No current facility-administered medications on file prior to encounter.   Current Outpatient Prescriptions on File Prior to Encounter  Medication Sig  . acetaminophen (TYLENOL) 325 MG tablet Take 2 tablets (650 mg total) by mouth every 6 (six) hours as needed for mild pain (or Fever >/= 101).  Marland Kitchen aspirin EC 81 MG EC tablet Take 1 tablet (81 mg total) by mouth daily. (Patient not taking: Reported on 02/12/2016)  . atorvastatin (LIPITOR) 40 MG tablet Take 1 tablet (40 mg total) by  mouth daily at 6 PM.  . blood glucose meter kit and supplies KIT Dispense based on patient and insurance preference. Use up to four times daily as directed. (FOR ICD-9 250.00, 250.01).  . carvedilol (COREG) 6.25 MG tablet Take 1 tablet (6.25 mg total) by mouth 2 (two) times daily with a meal. (Patient not taking: Reported on 02/12/2016)  . insulin glargine (LANTUS) 100 UNIT/ML injection Inject 0.1 mLs (10 Units total) into the skin at bedtime.  Marland Kitchen lisinopril (PRINIVIL,ZESTRIL) 20 MG tablet Take 1 tablet (20 mg total) by mouth daily.  . metFORMIN (GLUCOPHAGE) 500 MG tablet Take 1 tablet (500 mg total) by mouth 2 (two) times daily with a meal.  . ticagrelor (BRILINTA) 90 MG TABS tablet Take 1 tablet (90 mg total) by mouth 2 (two) times daily.    FAMILY HISTORY:  Her indicated that her mother is alive. She indicated that her father is alive. She indicated that her paternal uncle is alive.   SOCIAL HISTORY: She  reports that she has never smoked. She has never used smokeless tobacco. She reports that she drinks alcohol. She reports that she uses illicit drugs (Marijuana).  REVIEW OF SYSTEMS:   10 point review of system taken, please see HPI for positives and negatives.   SUBJECTIVE:  Moderate distress  VITAL SIGNS: BP 133/70 mmHg  Pulse 84  Temp(Src) 97.8 F (36.6 C) (Axillary)  Resp 18  SpO2 97%  LMP 01/19/2016  HEMODYNAMICS:    VENTILATOR SETTINGS:    INTAKE / OUTPUT:    PHYSICAL EXAMINATION: General:  MO WF in moderate resp distress, anxious Neuro: Intact HEENT:  No JVD Cardiovascular: hsr Lungs:  Exp wheeze  Abdomen:  *obese + bs Musculoskeletal:  intact Skin:  warm  LABS:  BMET  Recent Labs Lab 02/16/16 0339 02/17/16 0325 02/19/16 1126 02/19/16 1325  NA 134* 136 132* 136  K 4.1 3.9 >7.5* 4.7  CL 99* 101 97* 98*  CO2 _0 --   BUN 22* 20 20 22*  CREATININE 0.85 0.90 0.97 0.80  GLUCOSE 217* 179* 238* 269*    Electrolytes  Recent Labs Lab  02/12/16 2352 02/13/16 0740  02/16/16 0339 02/17/16 0325 02/19/16 1126  CALCIUM 9.2 9.5  < > 9.4 9.8 9.7  MG 1.8 1.8  --   --   --   --   PHOS 2.4* 3.1  --   --   --   --   < > = values in this interval not displayed.  CBC  Recent Labs Lab 02/15/16 1713 02/16/16 0339 02/19/16 1126 02/19/16 1325  WBC 9.0 10.3 16.8*  --   HGB 12.4 12.3 13.1 16.0*  HCT 40.0 41.6 43.2 47.0*  PLT 195 228 232  --     Coag's  Recent Labs Lab 02/15/16 0812  INR 1.07    Sepsis Markers No results for input(s): LATICACIDVEN, PROCALCITON, O2SATVEN in the last 168 hours.  ABG No results for input(s): PHART, PCO2ART, PO2ART in the last 168 hours.  Liver Enzymes  Recent Labs Lab 02/12/16 2352 02/15/16 0812 02/16/16 0339  AST 40 28 28  ALT _1 ALKPHOS 68 60 57  BILITOT 1.5* 0.7 1.0  ALBUMIN 3.1* 2.8* 3.0*    Cardiac Enzymes  Recent Labs Lab 02/12/16 2012 02/12/16 2352 02/19/16 1126  TROPONINI 4.84* 4.17* 0.86*    Glucose  Recent Labs Lab 02/16/16 0804 02/16/16 1133 02/16/16 1658 02/16/16 2100 02/17/16 0609 02/17/16 1209  GLUCAP 192* 158* 195* 208* 185* 204*    Imaging Dg Chest Port 1 View  02/19/2016  CLINICAL DATA:  Shortness of breath.  Wheezing.  Ex-smoker. EXAM: PORTABLE CHEST 1 VIEW COMPARISON:  02/16/2016. FINDINGS: Stable enlarged cardiac silhouette. Clear lungs. Thoracic spine degenerative changes. IMPRESSION: No acute abnormality.  Stable cardiomegaly. Electronically Signed   By: Claudie Revering M.D.   On: 02/19/2016 11:44     STUDIES:  4/16 ct neck 4/16 ENT larynx exam  CULTURES: 4/16 bc>> 4/16 ujc>>  ANTIBIOTICS: None  SIGNIFICANT EVENTS: 4/16 stridor  LINES/TUBES:   DISCUSSION: 50 yo female with recent MI an LAD stent  ASSESSMENT / PLAN:  PULMONARY A: Stridor VCD Suspected card injury from intubation Untreated OSA with no cpap x 3 years P:   Admit to ICU ENT has evaluated May need intubation' Steroids Stop ACE-I Cpap  nocturnal and PRN Anxiety control '  CARDIOVASCULAR A:  Recent STEMI with CC stent, 2 v dz Stent LAD balloon diagonal  P:  Stop ACE-I Cards consult  RENAL Lab Results  Component Value Date   CREATININE 0.80 02/19/2016   CREATININE 0.97 02/19/2016   CREATININE 0.90 02/17/2016   CREATININE 0.96 12/18/2013    A:   No acute issue P:   Follow creatine  GASTROINTESTINAL A:   GI protection P:   PPI BID  HEMATOLOGIC A:   DVT protection P:  Heparin  INFECTIOUS A:   No acute infection but WBC 17 P:   Low threshold for antibiotics   ENDOCRINE A:   DM P:   SSI  NEUROLOGIC A:   Anxiety P:   RASS goal: 0 Anxiolytics as needed   FAMILY  - Updates:  Updated at bedside. Explain ed need for ICU till airway issue cleared  - Inter-disciplinary family meet or Palliative Care meeting due by:  day 7   [facility] Minor ACNP Maryanna Shape PCCM Pager [phone] till 3 pm If no answer page [phone] 02/19/2016, 2:08 PM  Attending Note:  I have examined patient, reviewed labs, studies and notes. I have discussed the case with S Minor , and I agree with the data and plans as amended above. 50 yo woman, hx untreated OSA, CAD, HTN. I saw her last week when she was intubated for diastolic CHF and VDRF. She was extubated on 02/13/16. Her subsequent w/u included L cath and PTCI to mid LAD and the 1st diagonal. She was able to be discharged but notes that she was having some stridor then. This has worsened and finally prompted her to return today. She underwent lary by dr Constance Holster that was reassuring. On my eval she is completely comfortable, stable on RA but she does have significant inspiratory stridor. We will plan to treat empirically with short term steroids, check a CT scan of her neck. We will watch her in ICU overnight to insure stability. I suspect that she will need a bronchoscopy to visualize her airways past the cords.  Independent critical care time is 40 minutes.   Baltazar Apo, MD,  PhD 02/19/2016, 3:59 PM [facility] [phone] or if no answer [phone]

## 2016-02-19 NOTE — ED Provider Notes (Signed)
CSN: [id number]     Arrival date & time 02/19/16  1102 History   First MD Initiated Contact with Patient 02/19/16 1111     Chief Complaint  Patient presents with  . Shortness of Breath     Patient is a 50 y.o. female presenting with shortness of breath. The history is provided by the patient. No language interpreter was used.  Shortness of Breath  [place] is a 50 y.o. female who presents to the Emergency Department complaining of SOB.  She was discharged from the hospital 2 days ago following 2 week admission for flash pulmonary edema with respiratory failure and intubation as well as non-ST elevation MI with stent placement in the LAD. She was feeling improved at the time of discharge but did have some shortness of breath and congestion. Her symptoms significantly worsened yesterday and she feels like she has significant throat swelling and difficulty breathing. She also feels like her nose is blocked up. She denies any chest pain, leg swelling. Symptoms are severe, constant, worsening. When EMS presented to her house she was unable to speak and she had significant respiratory distress. The gave her nebulized epinephrine followed by Solu-Medrol, mag, albuterol. She was feeling significantly improved transiently but when transferred from the EMS stretcher to the ED stretcher she develops recurrent worsening shortness of breath.  Past Medical History  Diagnosis Date  . Diabetes mellitus   . Hypertension     Does not see a cardiologist  . Sleep apnea     Uses CPAP  . Flash pulmonary edema ([place]) 02/10/2016    requiring intubation  . NSTEMI (non-ST elevation myocardial infarction) ([place]) 02/10/2016  . ARF (acute respiratory failure) ([place]) 02/10/2016   Past Surgical History  Procedure Laterality Date  . Breast surgery      cyst right breast  . Anterior cervical decomp/discectomy fusion  08/28/2012    Procedure: ANTERIOR CERVICAL DECOMPRESSION/DISCECTOMY FUSION 2 LEVELS;  Surgeon: Otilio Connors, MD;  Location: [place] [facility];  Service: Neurosurgery;  Laterality: N/A;  Cervical four-five, cervical six-seven Anterior cervical decompression/diskectomy/fusion/LifeNet Bone/Trestle plate  . Cardiac catheterization N/A 02/15/2016    Procedure: Left Heart Cath and Coronary Angiography;  Surgeon: Wellington Hampshire, MD;  Location: [place] [facility];  Service: Cardiovascular;  Laterality: N/A;  . Cardiac catheterization N/A 02/15/2016    Procedure: Coronary Stent Intervention;  Surgeon: Wellington Hampshire, MD;  Location: [place] [facility];  Service: Cardiovascular;  Laterality: N/A;  . Cardiac catheterization N/A 02/15/2016    Procedure: Coronary Balloon Angioplasty;  Surgeon: Wellington Hampshire, MD;  Location: [place] [facility];  Service: Cardiovascular;  Laterality: N/A;  . Coronary angioplasty with stent placement     Family History  Problem Relation Age of Onset  . Heart disease Paternal Grandmother 48  . Diabetes Paternal Grandmother   . Hypertension Paternal Grandmother   . Heart disease Paternal Aunt   . Cancer Neg Hx   . Stroke Neg Hx   . Alcohol abuse Neg Hx   . Heart disease Father 68    MI   Social History  Substance Use Topics  . Smoking status: Never Smoker   . Smokeless tobacco: Never Used  . Alcohol Use: Yes     Comment: socially on holidays   OB History    No data available     Review of Systems  Respiratory: Positive for shortness of breath.   All other systems reviewed and are negative.  Allergies  Review of patient's allergies indicates no known allergies.  Home Medications   Prior to Admission medications   Medication Sig Start Date End Date Taking? Authorizing Provider  acetaminophen (TYLENOL) 325 MG tablet Take 2 tablets (650 mg total) by mouth every 6 (six) hours as needed for mild pain (or Fever >/= 101). 11/19/13  Yes Olam Idler, MD  blood glucose meter kit and supplies KIT Dispense based on patient and insurance preference. Use up to four  times daily as directed. (FOR ICD-9 250.00, 250.01). 02/17/16  Yes Theodis Blaze, MD  insulin glargine (LANTUS) 100 UNIT/ML injection Inject 0.1 mLs (10 Units total) into the skin at bedtime. 02/17/16  Yes Theodis Blaze, MD  lisinopril (PRINIVIL,ZESTRIL) 20 MG tablet Take 1 tablet (20 mg total) by mouth daily. 02/17/16  Yes Theodis Blaze, MD  ticagrelor (BRILINTA) 90 MG TABS tablet Take 1 tablet (90 mg total) by mouth 2 (two) times daily. 02/17/16  Yes Theodis Blaze, MD  aspirin EC 81 MG EC tablet Take 1 tablet (81 mg total) by mouth daily. Patient not taking: Reported on 02/12/2016 11/19/13   Olam Idler, MD  atorvastatin (LIPITOR) 40 MG tablet Take 1 tablet (40 mg total) by mouth daily at 6 PM. 12/18/13   Boykin Nearing, MD  carvedilol (COREG) 6.25 MG tablet Take 1 tablet (6.25 mg total) by mouth 2 (two) times daily with a meal. Patient not taking: Reported on 02/12/2016 12/18/13   Boykin Nearing, MD  metFORMIN (GLUCOPHAGE) 500 MG tablet Take 1 tablet (500 mg total) by mouth 2 (two) times daily with a meal. 12/18/13   Josalyn Funches, MD   BP 189/78 mmHg  Pulse 89  Temp(Src) 97.8 F (36.6 C) (Axillary)  Resp 19  SpO2 95%  LMP 01/19/2016 Physical Exam  Constitutional: She is oriented to person, place, and time. She appears well-developed and well-nourished. She appears distressed.  Obese  HENT:  Head: Normocephalic and atraumatic.  No significant swelling in the posterior oropharynx  Neck:  Large, full neck.   Cardiovascular: Normal rate and regular rhythm.   No murmur heard. Pulmonary/Chest: She is in respiratory distress.  Tachypnea, stridor, decreased air movement bilaterally  Abdominal: Soft. There is no tenderness. There is no rebound and no guarding.  Musculoskeletal: She exhibits no edema or tenderness.  Neurological: She is alert and oriented to person, place, and time.  Skin: Skin is warm and dry.  Psychiatric:  Anxious  Nursing note and vitals reviewed.   ED Course   Procedures   CRITICAL CARE Performed by: Quintella Reichert   Total critical care time: 30 minutes  Critical care time was exclusive of separately billable procedures and treating other patients.  Critical care was necessary to treat or prevent imminent or life-threatening deterioration.  Critical care was time spent personally by me on the following activities: development of treatment plan with patient and/or surrogate as well as nursing, discussions with consultants, evaluation of patient's response to treatment, examination of patient, obtaining history from patient or surrogate, ordering and performing treatments and interventions, ordering and review of laboratory studies, ordering and review of radiographic studies, pulse oximetry and re-evaluation of patient's condition.   Labs Review Labs Reviewed  BASIC METABOLIC PANEL - Abnormal; Notable for the following:    Sodium 132 (*)    Potassium >7.5 (*)    Chloride 97 (*)    Glucose, Bld 238 (*)    All other components within normal limits  CBC WITH  DIFFERENTIAL/PLATELET - Abnormal; Notable for the following:    WBC 16.8 (*)    RBC 5.47 (*)    MCH 23.9 (*)    RDW 16.7 (*)    Neutro Abs 12.7 (*)    Monocytes Absolute 1.3 (*)    All other components within normal limits  TROPONIN I - Abnormal; Notable for the following:    Troponin I 0.86 (*)    All other components within normal limits  COMPREHENSIVE METABOLIC PANEL - Abnormal; Notable for the following:    Sodium 133 (*)    Chloride 98 (*)    Glucose, Bld 304 (*)    All other components within normal limits  CBC - Abnormal; Notable for the following:    WBC 16.5 (*)    RBC 5.45 (*)    MCH 24.4 (*)    RDW 16.7 (*)    All other components within normal limits  PROTIME-INR - Abnormal; Notable for the following:    Prothrombin Time 15.5 (*)    All other components within normal limits  I-STAT VENOUS BLOOD GAS, ED - Abnormal; Notable for the following:    pH, Ven 7.487  (*)    pO2, Ven 83.0 (*)    Bicarbonate 34.4 (*)    Acid-Base Excess 10.0 (*)    All other components within normal limits  I-STAT CHEM 8, ED - Abnormal; Notable for the following:    Chloride 98 (*)    BUN 22 (*)    Glucose, Bld 269 (*)    Hemoglobin 16.0 (*)    HCT 47.0 (*)    All other components within normal limits  CULTURE, BLOOD (ROUTINE X 2)  CULTURE, BLOOD (ROUTINE X 2)  URINE CULTURE  MAGNESIUM  PHOSPHORUS  LACTIC ACID, PLASMA  APTT  URINE RAPID DRUG SCREEN, HOSP PERFORMED  PROCALCITONIN  CORTISOL  STREP PNEUMONIAE URINARY ANTIGEN  CBC  BASIC METABOLIC PANEL  BLOOD GAS, ARTERIAL    Imaging Review Ct Soft Tissue Neck W Contrast  02/19/2016  CLINICAL DATA:  Patient developed stridor after recent intubation. Some improvement after recent breathing treatments. EXAM: CT NECK WITH CONTRAST TECHNIQUE: Multidetector CT imaging of the neck was performed using the standard protocol following the bolus administration of intravenous contrast. CONTRAST:  23mL ISOVUE-300 IOPAMIDOL (ISOVUE-300) INJECTION 61% COMPARISON:  Chest radiograph early today. FINDINGS: Pharynx and larynx: Mild edema of the larynx including the arytenoids, false, and true cords. No pharyngeal lesion of significance. Parapharyngeal fat preserved. Diffuse subglottic tracheal edema extending over 3 cm segment, beginning 3 cm below the true cords, with circumferential wall narrowing, and fibrinous type webs. Estimated luminal narrowing could be up to 50%. Salivary glands: Negative Thyroid: Negative Lymph nodes: Negative Vascular: Negative Limited intracranial: Negative Visualized orbits: Negative Mastoids and visualized paranasal sinuses: Neck pain Skeleton: Status post C4-5 and C6-7 ACDF. These appear unremarkable. Upper chest: Not included. IMPRESSION: Subglottic tracheal edema extending over a 3 cm segment, beginning 3 cm below the true cords, with luminal narrowing up to 50%. This is likely related to recent  intubation. No intrinsic lesion of the larynx, but mild edema of the arytenoids as well as the true and false cords is observed. No pharyngeal or epiglottic abnormalities are detected. Electronically Signed   By: Elsie Stain M.D.   On: 02/19/2016 15:10   Dg Chest Port 1 View  02/19/2016  CLINICAL DATA:  Shortness of breath.  Wheezing.  Ex-smoker. EXAM: PORTABLE CHEST 1 VIEW COMPARISON:  02/16/2016. FINDINGS: Stable enlarged cardiac  silhouette. Clear lungs. Thoracic spine degenerative changes. IMPRESSION: No acute abnormality.  Stable cardiomegaly. Electronically Signed   By: Claudie Revering M.D.   On: 02/19/2016 11:44   I have personally reviewed and evaluated these images and lab results as part of my medical decision-making.   EKG Interpretation   Date/Time:  Sunday February 19 2016 11:14:38 EDT Ventricular Rate:  88 PR Interval:  165 QRS Duration: 186 QT Interval:  454 QTC Calculation: 549 R Axis:   -26 Text Interpretation:  Sinus rhythm Left bundle branch block Confirmed by  Hazle Coca [phone]) on 02/19/2016 11:20:20 AM      MDM   Final diagnoses:  Stridor    Pt here with increased SOB since yesterday, recently discharged from the hospital following intubation for pulmonary edema, NSTEMI.  Pt with significant distress on ED arrival.  She was given racemic epi with improvement in her sxs  - distress and accessory muscle use resolved but her stridor persisted.  Dr. Constance Holster with ENT consulted and evaluated the patient in the department - recommends observation in the ICU for subglottic edema.  PCCM consulted for further mgmt.  Pt updated of findings of studies and recommendation for admission.      Quintella Reichert, MD 02/19/16 1728

## 2016-02-19 NOTE — Progress Notes (Signed)
eLink Physician-Brief Progress Note Patient Name: [name] DOB: 10-Jun-1966 MRN: [id number]   Date of Service  02/19/2016  HPI/Events of Note  Stridor - patient is sitting up at side of the bed and obviously working to breath. Already on Solumedrol and Pepcid IV.   eICU Interventions  Will order: 1. Racemic Epinephrine 0.5 mg via neb Q 1 hour PRN stridor.  2. Will ask on-call Fellow, Dr. Zonia Kief, to evaluate the patient at bedside for possible intubation.      Intervention Category Major Interventions: Other:  Lenell Antu 02/19/2016, 7:37 PM

## 2016-02-19 NOTE — ED Notes (Signed)
CRITICAL VALUE ALERT  Critical value received:  0.86 troponin, >7.5 Potassium  Date of notification:  02/19/2016   Time of notification:  1234  Critical value read back:Yes.    Nurse who received alert:  Heide Guile  MD notified (1st page):  Dr. Madilyn Hook  Time of first page:  1235  Redraw lab

## 2016-02-20 ENCOUNTER — Encounter (HOSPITAL_COMMUNITY): Payer: Self-pay | Admitting: Student

## 2016-02-20 DIAGNOSIS — I2510 Atherosclerotic heart disease of native coronary artery without angina pectoris: Secondary | ICD-10-CM

## 2016-02-20 DIAGNOSIS — J9601 Acute respiratory failure with hypoxia: Secondary | ICD-10-CM

## 2016-02-20 DIAGNOSIS — Z4659 Encounter for fitting and adjustment of other gastrointestinal appliance and device: Secondary | ICD-10-CM | POA: Insufficient documentation

## 2016-02-20 LAB — POCT I-STAT 3, ART BLOOD GAS (G3+)
Acid-Base Excess: 3 mmol/L — ABNORMAL HIGH (ref 0.0–2.0)
BICARBONATE: 28 meq/L — AB (ref 20.0–24.0)
O2 Saturation: 99 %
PO2 ART: 114 mmHg — AB (ref 80.0–100.0)
TCO2: 29 mmol/L (ref 0–100)
pCO2 arterial: 41.7 mmHg (ref 35.0–45.0)
pH, Arterial: 7.433 (ref 7.350–7.450)

## 2016-02-20 LAB — GLUCOSE, CAPILLARY
GLUCOSE-CAPILLARY: 269 mg/dL — AB (ref 65–99)
GLUCOSE-CAPILLARY: 287 mg/dL — AB (ref 65–99)
GLUCOSE-CAPILLARY: 235 mg/dL — AB (ref 65–99)
GLUCOSE-CAPILLARY: 194 mg/dL — AB (ref 65–99)
GLUCOSE-CAPILLARY: 176 mg/dL — AB (ref 65–99)
GLUCOSE-CAPILLARY: 155 mg/dL — AB (ref 65–99)
GLUCOSE-CAPILLARY: 153 mg/dL — AB (ref 65–99)
GLUCOSE-CAPILLARY: 250 mg/dL — AB (ref 65–99)
Glucose-Capillary: 304 mg/dL — ABNORMAL HIGH (ref 65–99)
Glucose-Capillary: 181 mg/dL — ABNORMAL HIGH (ref 65–99)
Glucose-Capillary: 205 mg/dL — ABNORMAL HIGH (ref 65–99)
Glucose-Capillary: 164 mg/dL — ABNORMAL HIGH (ref 65–99)
Glucose-Capillary: 218 mg/dL — ABNORMAL HIGH (ref 65–99)

## 2016-02-20 LAB — CBC
HEMATOCRIT: 42.3 % (ref 36.0–46.0)
HEMOGLOBIN: 12.6 g/dL (ref 12.0–15.0)
MCH: 23.5 pg — ABNORMAL LOW (ref 26.0–34.0)
MCHC: 29.8 g/dL — AB (ref 30.0–36.0)
MCV: 78.9 fL (ref 78.0–100.0)
PLATELETS: 247 10*3/uL (ref 150–400)
RBC: 5.36 MIL/uL — ABNORMAL HIGH (ref 3.87–5.11)
RDW: 16.6 % — AB (ref 11.5–15.5)
WBC: 20.4 10*3/uL — AB (ref 4.0–10.5)

## 2016-02-20 LAB — BASIC METABOLIC PANEL
Anion gap: 10 (ref 5–15)
BUN: 26 mg/dL — AB (ref 6–20)
CALCIUM: 9.8 mg/dL (ref 8.9–10.3)
CO2: 26 mmol/L (ref 22–32)
CREATININE: 1.27 mg/dL — AB (ref 0.44–1.00)
Chloride: 100 mmol/L — ABNORMAL LOW (ref 101–111)
GFR calc Af Amer: 57 mL/min — ABNORMAL LOW (ref >60)
GFR, EST NON AFRICAN AMERICAN: 49 mL/min — AB (ref >60)
GLUCOSE: 292 mg/dL — AB (ref 65–99)
Potassium: 4.9 mmol/L (ref 3.5–5.1)
SODIUM: 136 mmol/L (ref 135–145)

## 2016-02-20 LAB — TROPONIN I: Troponin I: 0.69 ng/mL (ref <0.031)

## 2016-02-20 MED ORDER — PRO-STAT SUGAR FREE PO LIQD
30.0000 mL | Freq: Three times a day (TID) | ORAL | Status: DC
  Administered 2016-02-20 – 2016-02-22 (×6): 30 mL
  Filled 2016-02-20 (×8): qty 30

## 2016-02-20 MED ORDER — CARVEDILOL 6.25 MG PO TABS
6.2500 mg | ORAL_TABLET | Freq: Two times a day (BID) | ORAL | Status: DC
  Administered 2016-02-20 – 2016-02-23 (×6): 6.25 mg via ORAL
  Filled 2016-02-20 (×6): qty 1

## 2016-02-20 MED ORDER — SODIUM CHLORIDE 0.9 % IV SOLN
INTRAVENOUS | Status: DC
  Administered 2016-02-20: 11:00:00 via INTRAVENOUS

## 2016-02-20 MED ORDER — ASPIRIN 81 MG PO CHEW
81.0000 mg | CHEWABLE_TABLET | Freq: Every day | ORAL | Status: DC
  Administered 2016-02-20 – 2016-02-23 (×4): 81 mg via NASOGASTRIC
  Filled 2016-02-20 (×5): qty 1

## 2016-02-20 MED ORDER — ADULT MULTIVITAMIN W/MINERALS CH
1.0000 | ORAL_TABLET | Freq: Every day | ORAL | Status: DC
  Administered 2016-02-20 – 2016-02-23 (×4): 1
  Filled 2016-02-20 (×5): qty 1

## 2016-02-20 MED ORDER — ATORVASTATIN CALCIUM 40 MG PO TABS
40.0000 mg | ORAL_TABLET | Freq: Every day | ORAL | Status: DC
  Administered 2016-02-20 – 2016-02-24 (×5): 40 mg via ORAL
  Filled 2016-02-20 (×5): qty 1

## 2016-02-20 MED ORDER — VITAL HIGH PROTEIN PO LIQD
1000.0000 mL | ORAL | Status: DC
  Administered 2016-02-20 (×2): 1000 mL
  Administered 2016-02-20 – 2016-02-21 (×2)
  Administered 2016-02-21 – 2016-02-22 (×2): 1000 mL
  Administered 2016-02-23: 09:00:00
  Filled 2016-02-20 (×2): qty 1000

## 2016-02-20 MED ORDER — INSULIN ASPART 100 UNIT/ML ~~LOC~~ SOLN
0.0000 [IU] | SUBCUTANEOUS | Status: DC
  Administered 2016-02-20: 5 [IU] via SUBCUTANEOUS
  Administered 2016-02-20: 3 [IU] via SUBCUTANEOUS
  Administered 2016-02-20 – 2016-02-21 (×2): 5 [IU] via SUBCUTANEOUS
  Administered 2016-02-21: 3 [IU] via SUBCUTANEOUS
  Administered 2016-02-21 (×2): 5 [IU] via SUBCUTANEOUS
  Administered 2016-02-21: 3 [IU] via SUBCUTANEOUS
  Administered 2016-02-21 – 2016-02-22 (×3): 5 [IU] via SUBCUTANEOUS
  Administered 2016-02-22 (×2): 3 [IU] via SUBCUTANEOUS
  Administered 2016-02-22: 5 [IU] via SUBCUTANEOUS
  Administered 2016-02-22: 3 [IU] via SUBCUTANEOUS
  Administered 2016-02-23 (×2): 5 [IU] via SUBCUTANEOUS
  Administered 2016-02-23 (×3): 3 [IU] via SUBCUTANEOUS
  Administered 2016-02-23: 2 [IU] via SUBCUTANEOUS
  Administered 2016-02-24: 3 [IU] via SUBCUTANEOUS
  Administered 2016-02-24: 5 [IU] via SUBCUTANEOUS
  Administered 2016-02-24 (×2): 3 [IU] via SUBCUTANEOUS
  Administered 2016-02-24: 11 [IU] via SUBCUTANEOUS
  Administered 2016-02-25: 3 [IU] via SUBCUTANEOUS
  Administered 2016-02-25: 5 [IU] via SUBCUTANEOUS
  Administered 2016-02-25 (×2): 3 [IU] via SUBCUTANEOUS

## 2016-02-20 MED ORDER — INSULIN GLARGINE 100 UNIT/ML ~~LOC~~ SOLN
15.0000 [IU] | Freq: Every day | SUBCUTANEOUS | Status: DC
  Administered 2016-02-20: 15 [IU] via SUBCUTANEOUS
  Filled 2016-02-20 (×2): qty 0.15

## 2016-02-20 NOTE — Progress Notes (Signed)
Initial Nutrition Assessment  DOCUMENTATION CODES:   Morbid obesity  INTERVENTION:   Initiate Vital High Protein @ 20 ml/hr via OG tube and increase by 10 ml every 4 hours to goal rate of 40 ml/hr.   30 ml Prostat TID.    MVI daily  Tube feeding regimen provides 1260 kcal, 129 grams of protein, and 802 ml of H2O.    NUTRITION DIAGNOSIS:   Inadequate oral intake related to inability to eat as evidenced by NPO status.  GOAL:   Provide needs based on ASPEN/SCCM guidelines  MONITOR:   Vent status, Labs, TF tolerance  REASON FOR ASSESSMENT:   Consult Enteral/tube feeding initiation and management  ASSESSMENT:   50 yo female with recent MI an LAD stent. With recent admission for Flash pulm edema, intubated, also had NSTEMI with PCI. Pt presented with worsening stridor and difficulty breathing.  Patient is currently intubated on ventilator support MV: 7.9 L/min Temp (24hrs), Avg:97.8 F (36.6 C), Min:96.4 F (35.8 C), Max:98.2 F (36.8 C) Pt awake and alert, denies any recent weight changes.  Nutrition-Focused physical exam completed. Findings are no fat depletion, no muscle depletion, and mild edema.  CBG's: 155-194   Diet Order:  Diet NPO time specified  Skin:  Reviewed, no issues  Last BM:  unknown  Height:   Ht Readings from Last 1 Encounters:  02/19/16 5\' 2"  (1.575 m)    Weight:   Wt Readings from Last 1 Encounters:  02/20/16 226 lb 10.1 oz (102.8 kg)    Ideal Body Weight:  50 kg  BMI:  Body mass index is 41.44 kg/(m^2).  Estimated Nutritional Needs:   Kcal:  8657-8469  Protein:  >/= 125 grams  Fluid:  > 1.5 L/day  EDUCATION NEEDS:   No education needs identified at this time  Nalee Klapp RD, LDN, CNSC [phone] Pager [phone] After Hours Pager

## 2016-02-20 NOTE — Care Management Note (Signed)
Case Management Note  Patient Details  Name: [name] MRN: [id number] Date of Birth: June 09, 1966  Subjective/Objective:       Adm w stridor,vent             Action/Plan:lives w fam, pcp dr Randolm Idol   Expected Discharge Date:                Expected Discharge Plan:  Home w Home Health Services  In-House Referral:     Discharge planning Services     Post Acute Care Choice:    Choice offered to:     DME Arranged:    DME Agency:     HH Arranged:    HH Agency:     Status of Service:     Medicare Important Message Given:    Date Medicare IM Given:    Medicare IM give by:    Date Additional Medicare IM Given:    Additional Medicare Important Message give by:     If discussed at Long Length of Stay Meetings, dates discussed:    Additional Comments: ur review done  Hanley Hays, RN 02/20/2016, 10:09 AM

## 2016-02-20 NOTE — Consult Note (Signed)
Cardiology Consult    Patient ID: AZYAH FLETT MRN: [id number], DOB/AGE: 1966/08/03   Admit date: 02/19/2016 Date of Consult: 02/20/2016  Primary Physician: Lupita Dawn, MD Reason for Consult: NSTEMI; Mitral Valve Mass Primary Cardiologist: Dr. Burt Knack Requesting Provider: Dr. Lamonte Sakai  History of Present Illness    [name] is a 50 y.o. female with past medical history of CAD, HTN, chronic combined systolic and diastolic CHF (EF 48-27% by echo in 02/2016), DM, OSA (on CPAP), and Type 2 DM who presented to [facility] ED on 02/19/2016 for new onset shortness of breath.   She was recently admitted from 02/10/2016 - 02/17/2016 for acute respiratory failure and requiring emergent intubation in the ED. She was later weaned and extubated on 02/13/2016. She had an NSTEMI with a peak troponin of 7.20 on 02/12/2016. A cardiac catheterization was performed on 02/15/2016 showing 99% stenosis in the mid-LAD and 1st Diag. PCI was performed with a DES to the mid-LAD and balloon angioplasty alone of the 1st Diag. She was started on DAPT with ASA and Brilinta  and staged PCI of the 85% stenosis of the RCA was recommended in the next 3-4 weeks. Her weight at the time of discharge was 235 lbs.  Since going home, she reported worsening shortness of breath for the past 2 days. While in the ED, she was noted to have stridor and was evaluated by ENT who thought her dyspnea was secondary to probably subglottic edema. Overnight, she required intubation. A CT of the neck showed subglottic tracheal edema with luminal narrowing up to 50%, thought to be related to recent intubation.   Labs this admission show a troponin of 0.86. WBC 20.4. Hgb 12.6. Platelets 247.  K+ 7.5 on admission (improved to 4.9 today). Creatinine 1.27. CXR shows low lung volumes with bilateral airspace opacities. EKG shows NSR, HR 88 with LBBB, Up-sloping ST in anterior leads, similar to previous tracings.   At the current time, the patient is  intubated. She denies any chest pain or palpitations at this current time. Her daughter and husband are at the bedside and say that yesterday morning she just kept telling them she could not catch her breath, so they called EMS.  Past Medical History   Past Medical History  Diagnosis Date  . Diabetes mellitus   . Hypertension     Does not see a cardiologist  . Sleep apnea     Uses CPAP  . Flash pulmonary edema ([place]) 02/10/2016    requiring intubation  . CAD (coronary artery disease) 02/10/2016    a. NSTEMI 02/2016: DES to mid-LAD, POBA to 1st Diag, 85% stenosis of RCA noted with staged PCI recommended  . ARF (acute respiratory failure) ([place]) 02/10/2016    Past Surgical History  Procedure Laterality Date  . Breast surgery      cyst right breast  . Anterior cervical decomp/discectomy fusion  08/28/2012    Procedure: ANTERIOR CERVICAL DECOMPRESSION/DISCECTOMY FUSION 2 LEVELS;  Surgeon: Otilio Connors, MD;  Location: [place] [facility];  Service: Neurosurgery;  Laterality: N/A;  Cervical four-five, cervical six-seven Anterior cervical decompression/diskectomy/fusion/LifeNet Bone/Trestle plate  . Cardiac catheterization N/A 02/15/2016    Procedure: Left Heart Cath and Coronary Angiography;  Surgeon: Wellington Hampshire, MD;  Location: [facility];  Service: Cardiovascular;  Laterality: N/A;  . Cardiac catheterization N/A 02/15/2016    Procedure: Coronary Stent Intervention;  Surgeon: Wellington Hampshire, MD;  Location: [place] [facility];  Service: Cardiovascular;  Laterality: N/A;  . Cardiac catheterization N/A 02/15/2016    Procedure: Coronary Balloon Angioplasty;  Surgeon: Wellington Hampshire, MD;  Location: [facility];  Service: Cardiovascular;  Laterality: N/A;  . Coronary angioplasty with stent placement       Allergies  No Known Allergies  Inpatient Medications    . antiseptic oral rinse  7 mL Mouth Rinse BID  . antiseptic oral rinse  7 mL Mouth Rinse QID  . aspirin  81 mg Per NG  tube Daily  . atorvastatin  40 mg Oral q1800  . carvedilol  6.25 mg Oral BID WC  . chlorhexidine gluconate (SAGE KIT)  15 mL Mouth Rinse BID  . famotidine (PEPCID) IV  20 mg Intravenous Q12H  . feeding supplement (VITAL HIGH PROTEIN)  1,000 mL Per Tube Q24H  . heparin  5,000 Units Subcutaneous 3 times per day  . insulin aspart  0-15 Units Subcutaneous 6 times per day  . insulin glargine  15 Units Subcutaneous Daily  . methylPREDNISolone (SOLU-MEDROL) injection  40 mg Intravenous Q12H  . ticagrelor  90 mg Oral BID    Family History    Family History  Problem Relation Age of Onset  . Heart disease Paternal Grandmother 103  . Diabetes Paternal Grandmother   . Hypertension Paternal Grandmother   . Heart disease Paternal Aunt   . Cancer Neg Hx   . Stroke Neg Hx   . Alcohol abuse Neg Hx   . Heart disease Father 76    MI    Social History    Social History   Social History  . Marital Status: Single    Spouse Name: N/A  . Number of Children: N/A  . Years of Education: N/A   Occupational History  . Not on file.   Social History Main Topics  . Smoking status: Never Smoker   . Smokeless tobacco: Never Used  . Alcohol Use: Yes     Comment: socially on holidays  . Drug Use: Yes    Special: Marijuana     Comment: Last used yesterday  . Sexual Activity: Yes    Birth Control/ Protection: None   Other Topics Concern  . Not on file   Social History Narrative   Lives with husband.  Has one daughter.  Work full time at [employer].   Some high school education.        Mother's history is unknown patient raised by paternal grandmother      Review of Systems    General:  No chills, fever, night sweats or weight changes.  Cardiovascular:  No chest pain, edema, orthopnea, palpitations, paroxysmal nocturnal dyspnea. Positive for dyspnea.  Dermatological: No rash, lesions/masses Respiratory: No cough, Positive for dyspnea. Urologic: No hematuria, dysuria Abdominal:   No  nausea, vomiting, diarrhea, bright red blood per rectum, melena, or hematemesis Neurologic:  No visual changes, wkns, changes in mental status. All other systems reviewed and are otherwise negative except as noted above.  Physical Exam    Blood pressure 149/69, pulse 70, temperature 98 F (36.7 C), temperature source Oral, resp. rate 18, height 5' 2"  (1.575 m), weight 226 lb 10.1 oz (102.8 kg), last menstrual period 01/19/2016, SpO2 97 %.  General: Pleasant, Caucasian female, currently intubated but arousable.  Psych: Normal affect. Neuro: Moves all extremities spontaneously. Opens her eyes. Able to nod yes/no to questions. HEENT: Normal  Neck: Supple without bruits or JVD. Lungs:  Resp regular and unlabored, CTA without wheezing or  rales. Heart: RRR no s3, s4, or murmurs. Abdomen: Soft, non-tender, non-distended, BS + x 4.  Extremities: No clubbing, cyanosis or edema. DP/PT/Radials 2+ and equal bilaterally.  Labs    Troponin (Point of Care Test) No results for input(s): TROPIPOC in the last 72 hours.  Recent Labs  02/19/16 1126  TROPONINI 0.86*   Lab Results  Component Value Date   WBC 20.4* 02/20/2016   HGB 12.6 02/20/2016   HCT 42.3 02/20/2016   MCV 78.9 02/20/2016   PLT 247 02/20/2016     Recent Labs Lab 02/19/16 1615 02/20/16 0222  NA 133* 136  K 4.9 4.9  CL 98* 100*  CO2 22 26  BUN 20 26*  CREATININE 0.93 1.27*  CALCIUM 9.8 9.8  PROT 7.3  --   BILITOT 1.1  --   ALKPHOS 73  --   ALT 27  --   AST 29  --   GLUCOSE 304* 292*   Lab Results  Component Value Date   CHOL 169 11/17/2013   HDL 48 11/17/2013   LDLCALC 99 11/17/2013   TRIG 749* 02/10/2016   No results found for: [facility]   Radiology Studies    Dg Chest 2 View: 02/16/2016  CLINICAL DATA:  Cough and congestion for 1 week. EXAM: CHEST  2 VIEW COMPARISON:  02/13/2016 chest radiograph. FINDINGS: Partially visualized surgical hardware from ACDF overlying the lower cervical spine. Stable  cardiomediastinal silhouette with mild cardiomegaly. No pneumothorax. No pleural effusion. Lungs appear clear, with no acute consolidative airspace disease and no pulmonary edema. IMPRESSION: Stable mild cardiomegaly without pulmonary edema. No active pulmonary disease. Electronically Signed   By: Ilona Sorrel M.D.   On: 02/16/2016 13:09   Ct Soft Tissue Neck W Contrast: 02/19/2016  CLINICAL DATA:  Patient developed stridor after recent intubation. Some improvement after recent breathing treatments. EXAM: CT NECK WITH CONTRAST TECHNIQUE: Multidetector CT imaging of the neck was performed using the standard protocol following the bolus administration of intravenous contrast. CONTRAST:  57m ISOVUE-300 IOPAMIDOL (ISOVUE-300) INJECTION 61% COMPARISON:  Chest radiograph early today. FINDINGS: Pharynx and larynx: Mild edema of the larynx including the arytenoids, false, and true cords. No pharyngeal lesion of significance. Parapharyngeal fat preserved. Diffuse subglottic tracheal edema extending over 3 cm segment, beginning 3 cm below the true cords, with circumferential wall narrowing, and fibrinous type webs. Estimated luminal narrowing could be up to 50%. Salivary glands: Negative Thyroid: Negative Lymph nodes: Negative Vascular: Negative Limited intracranial: Negative Visualized orbits: Negative Mastoids and visualized paranasal sinuses: Neck pain Skeleton: Status post C4-5 and C6-7 ACDF. These appear unremarkable. Upper chest: Not included. IMPRESSION: Subglottic tracheal edema extending over a 3 cm segment, beginning 3 cm below the true cords, with luminal narrowing up to 50%. This is likely related to recent intubation. No intrinsic lesion of the larynx, but mild edema of the arytenoids as well as the true and false cords is observed. No pharyngeal or epiglottic abnormalities are detected. Electronically Signed   By: JStaci RighterM.D.   On: 02/19/2016 15:10   Dg Chest Port 1 View: 02/20/2016  CLINICAL DATA:   Intubation. EXAM: PORTABLE CHEST 1 VIEW COMPARISON:  Earlier this day at 2116 hour FINDINGS: Endotracheal tube 2.1 cm from the carina. Enteric tube in place, tip below the diaphragm not included in the field of view. Cardiomediastinal contours are unchanged. Left greater than right airspace opacities primarily in the perihilar regions, with minimal interval improvement. No large pleural effusion or pneumothorax. Low lung volumes  persist. IMPRESSION: 1. Endotracheal tube 2.1 cm from the carina.  Enteric tube in place. 2. Low lung volumes with bilateral airspace opacities, mild improvement from prior exam. Electronically Signed   By: Jeb Levering M.D.   On: 02/20/2016 00:03   Dg Chest Port 1 View: 02/19/2016  CLINICAL DATA:  Patient status post ET tube placement. EXAM: PORTABLE CHEST 1 VIEW COMPARISON:  Chest radiograph 02/19/2016. FINDINGS: ET tube terminates within the proximal trachea. Enteric tube courses inferior to the diaphragm. Stable cardiomegaly. Interval development of diffuse consolidation throughout the left lung with patchy areas of consolidation within the right lung. No definite pleural effusion or pneumothorax. IMPRESSION: ET tube terminates in the proximal trachea. Enteric tube courses inferior to the diaphragm. Interval development of scattered consolidation throughout the left lung and patchy opacities within the right mid lung. Findings are nonspecific and may represent aspiration, asymmetric edema, atelectasis or infection. Electronically Signed   By: Lovey Newcomer M.D.   On: 02/19/2016 21:47    EKG & Cardiac Imaging    EKG:  NSR, HR 88 with LBBB, Up-sloping ST in anterior leads, similar to previous tracings  Echocardiogram: 02/13/2016 - Left ventricle: The cavity size was normal. Wall thickness was  increased in a pattern of severe LVH. Systolic function was  mildly to moderately reduced. The estimated ejection fraction was  in the range of 40% to 45%. Apical and inferoapical  akinesis. No  mural thrombus with Definity. Doppler parameters are consistent  with pseudonormal left ventricular relaxation (grade 2 diastolic  dysfunction). The E/e&' ratio is >20, suggesting markedly elevated  LV filling pressure. - Mitral valve: Calcified annulus. There appears to be a mobile  mass on the atrial side of the mitral valve which prolapses,  measuring 3.0 x 1.5 cm, seen in the 2 chamber view, but not well  in other view. This could represent thrombus or vegetation, or  could be artifactual. There was trivial regurgitation. - Left atrium: The atrium was mildly dilated. - Right ventricle: The cavity size was normal. Wall thickness was  normal. Systolic function was normal. - Right atrium: The atrium was mildly dilated. - Atrial septum: No defect or patent foramen ovale was identified. - Inferior vena cava: The vessel was normal in size. The  respirophasic diameter changes were in the normal range (= 50%),  consistent with normal central venous pressure.  Impressions:  - Compared to a recent echo on 02/10/2016, the LVEF is lower at  40-45% with apical and inferoapical akinesis. No mural thrombus  seen with Definity. There is a questionable prolapsing mass on  the mitral valve, but only seen in the 2 chamber view, trivial  associated AI - this may be artifact or possibly thrombus.  Recommend further evalution by TEE to exclude thrombus given  reduced LV function and apical wall motion abnormality.  Cardiac Catheterization: 02/15/2016  Ost Cx to Prox Cx lesion, 20% stenosed.  Ost LAD lesion, 50% stenosed.  2nd Mrg lesion, 40% stenosed.  Ost 3rd Mrg to 3rd Mrg lesion, 50% stenosed.  Ost 2nd Diag to 2nd Diag lesion, 70% stenosed.  Mid RCA lesion, 85% stenosed.  Dist RCA lesion, 30% stenosed.  Ost RPDA to RPDA lesion, 50% stenosed.  Mid LAD to Dist LAD lesion, 99% stenosed. Post intervention, there is a 0% residual stenosis.  Ost 1st Diag to  1st Diag lesion, 99% stenosed. Post intervention, there is a 40% residual stenosis.  1. Severe two-vessel coronary artery disease involving the mid to distal LAD,  large first diagonal and mid right coronary artery. 2. Moderately elevated left ventricular end-diastolic pressure with an LVEDP of 22 mmHg. Left ventricular angiography was not performed due to recent renal failure and known EF by echo. 3.Successful angioplasty and drug-eluting stent placement to the mid LAD and balloon angioplasty of the first diagonal.  Recommendations: Dual antiplatelet therapy for at least one year. Staged RCA PCI in 3-4 weeks.  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 Avoid the right radial artery catheterization due to dual radial system.   Assessment & Plan    1. Elevated Troponin/ History of CAD - NSTEMI during recent admission with a peak troponin of 7.20 on 02/12/2016. Cardiac cath on 02/15/2016 showed 99% stenosis in the mid-LAD and 1st Diag. PCI was performed w/ DES to the mid-LAD and balloon angioplasty alone of the 1st Diag. Started on DAPT with ASA and Brilinta  and staged PCI of the 85% stenosis of the RCA was recommended in the next 3-4 weeks. - troponin this admission is 0.86, consistent with trending down from her recent NSTEMI. Does not represent an acute event. Would continue to cycle, but unless it becomes significantly elevated, would not pursue further ischemic evaluation at this time until staged PCI in the next few weeks and when her respiratory status improves. There is mention of a potential trach.  - continue ASA, statin, BB, and Brilinta.  2. Chronic Combined Systolic and Diastolic CHF - echo last admission showed an EF of 40-45% with Grade 2 DD - appears evolemic. Weight at time of discharge was 235 lbs. Currently at 226 lbs.   - Continue BB. ACE-I stopped due to subglottic swelling.   3. Mitral Valve  Mass - noted on echo last admission, measuring 3.0 x 1.5 cm. - no TEE was performed at that time due to her being high-risk secondary to difficulty swallowing. Her airway is now protected. Could consider bedside TEE at this time now that she is intubated.  4. Subglottic Edema - per admitting team   Signed, Erma Heritage, PA-C 02/20/2016, 11:29 AM Pager: [phone]  Patient seen, examined. Available data reviewed. Agree with findings, assessment, and plan as outlined by Bernerd Pho, PA-C. The patient's history is reviewed. She has undergone recent cath and PCI with plans for staged PCI in 2-3 weeks. She has required emergent intubation for subglottic edema/stenosis. On exam, she is awake and alert, on the ventilator. Lungs are CTA, heart RRR without murmur. Extremities with mild edema, abdomen soft and NT.   Echo done last week showed possible mitral valve mass. This is reviewed. TEE was recommended but not done because of concerns over airway. Seems most prudent approach now is to do TEE while patient is intubated. Will discuss with CCM team. Otherwise would continue current management with cardiac medications (ASA and Brilinta with recent stent placement).  Sherren Mocha, M.D. 02/20/2016 1:13 PM

## 2016-02-20 NOTE — Progress Notes (Signed)
Patient ID: [name], female   DOB: Mar 22, 1966, 50 y.o.   MRN: [id number] Intubated last night. Intubated with 7.o ETT without any difficulty. Would recommend treating for reflux aggressively, stopping ACE-I and attempt extubation when appropriate. If fails, then trach will be next step for airway control.

## 2016-02-20 NOTE — Progress Notes (Signed)
Inpatient Diabetes Program Recommendations  AACE/ADA: New Consensus Statement on Inpatient Glycemic Control (2015)  Target Ranges:  Prepandial:   less than 140 mg/dL      Peak postprandial:   less than 180 mg/dL (1-2 hours)      Critically ill patients:  140 - 180 mg/dL   Review of Glycemic Control Results for ZENITA, SYFERT (MRN [id number]) as of 02/20/2016 09:05  Ref. Range 02/20/2016 04:46 02/20/2016 06:00 02/20/2016 06:55 02/20/2016 07:58 02/20/2016 09:00  Glucose-Capillary Latest Ref Range: 65-99 mg/dL 381 (H) 017 (H) 510 (H) 176 (H) 155 (H)   Diabetes history: DM Type 2 Outpatient Diabetes medications: Lantus insulin 10 units q hs + Metformin 500 mg bid Current orders for Inpatient glycemic control: Lantus 15 units daily + Novolog moderate correction scale 0-15  Inpatient Diabetes Program Recommendations:  Noted patient transitioned from IV insulin drip. Will follow.  Thank you, Billy Fischer. Marshell Dilauro, RN, MSN, CDE Inpatient Glycemic Control Team Team Pager [phone] (8am-5pm) 02/20/2016 9:07 AM

## 2016-02-20 NOTE — Progress Notes (Signed)
PULMONARY / CRITICAL CARE MEDICINE   Name: [name] MRN: [id number] DOB: 02/19/1966    ADMISSION DATE:  02/19/2016  Consult Date- 02/19/2016  REFERRING MD:EDP  CHIEF COMPLAINT:  STRIDOR  HISTORY OF PRESENT ILLNESS:   50 yo obese female just discharged  4/14 after being intubated for MI from LAD/diagonal blockage required stent and balloon angioplasty. She was extubated and on day of discharge she noted noisy breathing (documented in cards note) but was discharged to home. She reports her "noisy breathing became unbearable and she presents to ER\ with stridor. ENT Scoped her without obvious injury. Please note she has documented OSA with broken cpap machine x 3 years. Recent intubation, untreated OSA and ACE-I may be contributing factors to stridor. We will check CT neck, PPI bid, steroids and + or - abx. She may need intubation for airway protection.  PAST MEDICAL HISTORY :  She  has a past medical history of Diabetes mellitus; Hypertension; Sleep apnea; Flash pulmonary edema (HCC) (02/10/2016); NSTEMI (non-ST elevation myocardial infarction) (HCC) (02/10/2016); and ARF (acute respiratory failure) (HCC) (02/10/2016).  SUBJECTIVE:  Overnight had increased difficulty breathing. ABG with mild acidosis with hypercapnea. Pt intubated. Intubated, sleeping, but easily arousable. No complaints today.   VITAL SIGNS: BP 126/71 mmHg  Pulse 62  Temp(Src) 98 F (36.7 C) (Oral)  Resp 11  Ht 5\' 2"  (1.575 m)  Wt 226 lb 10.1 oz (102.8 kg)  BMI 41.44 kg/m2  SpO2 99%  LMP 01/19/2016  HEMODYNAMICS:   VENTILATOR SETTINGS: Vent Mode:  [-] PRVC FiO2 (%):  [60 %-100 %] 60 % Set Rate:  [18 bmp] 18 bmp Vt Set:  [420 mL] 420 mL PEEP:  [5 cmH20] 5 cmH20 Plateau Pressure:  [22 cmH20-26 cmH20] 22 cmH20  INTAKE / OUTPUT: I/O last 3 completed shifts: In: 218.7 [I.V.:168.7; IV Piggyback:50] Out: 875 [Urine:875]  PHYSICAL EXAMINATION: General:  Intubated, not in any distress, sleepy but  arousable. Neuro: Intact, obeys commands HEENT:  OG tube, ETT. Pupils equal round Cardiovascular: Regular, no murmurs Lungs:  No wheeze, no crackles. Abdomen:  *obese, not tender, bs Musculoskeletal:  intact Skin:  warm  LABS:  BMET  Recent Labs Lab 02/19/16 1126 02/19/16 1325 02/19/16 1615 02/20/16 0222  NA 132* 136 133* 136  K >7.5* 4.7 4.9 4.9  CL 97* 98* 98* 100*  CO2 26  --  22 26  BUN 20 22* 20 26*  CREATININE 0.97 0.80 0.93 1.27*  GLUCOSE 238* 269* 304* 292*    Electrolytes  Recent Labs Lab 02/19/16 1126 02/19/16 1615 02/20/16 0222  CALCIUM 9.7 9.8 9.8  MG  --  2.3  --   PHOS  --  3.9  --     CBC  Recent Labs Lab 02/19/16 1126 02/19/16 1325 02/19/16 1615 02/20/16 0222  WBC 16.8*  --  16.5* 20.4*  HGB 13.1 16.0* 13.3 12.6  HCT 43.2 47.0* 43.1 42.3  PLT 232  --  215 247    Coag's  Recent Labs Lab 02/15/16 0812 02/19/16 1615  APTT  --  27  INR 1.07 1.22    Sepsis Markers  Recent Labs Lab 02/19/16 1615  LATICACIDVEN 1.9  PROCALCITON <0.10    ABG  Recent Labs Lab 02/19/16 2207 02/20/16 0244  PHART 7.326* 7.433  PCO2ART 58.7* 41.7  PO2ART 243.0* 114.0*    Liver Enzymes  Recent Labs Lab 02/15/16 0812 02/16/16 0339 02/19/16 1615  AST 28 28 29   ALT 20 22 27   ALKPHOS 60 57 73  BILITOT 0.7 1.0 1.1  ALBUMIN 2.8* 3.0* 3.5    Cardiac Enzymes  Recent Labs Lab 02/19/16 1126  TROPONINI 0.86*    Glucose  Recent Labs Lab 02/20/16 0234 02/20/16 0343 02/20/16 0446 02/20/16 0600 02/20/16 0655 02/20/16 0758  GLUCAP 287* 235* 205* 181* 194* 176*    Imaging Ct Soft Tissue Neck W Contrast  02/19/2016  CLINICAL DATA:  Patient developed stridor after recent intubation. Some improvement after recent breathing treatments. EXAM: CT NECK WITH CONTRAST TECHNIQUE: Multidetector CT imaging of the neck was performed using the standard protocol following the bolus administration of intravenous contrast. CONTRAST:  75mL  ISOVUE-300 IOPAMIDOL (ISOVUE-300) INJECTION 61% COMPARISON:  Chest radiograph early today. FINDINGS: Pharynx and larynx: Mild edema of the larynx including the arytenoids, false, and true cords. No pharyngeal lesion of significance. Parapharyngeal fat preserved. Diffuse subglottic tracheal edema extending over 3 cm segment, beginning 3 cm below the true cords, with circumferential wall narrowing, and fibrinous type webs. Estimated luminal narrowing could be up to 50%. Salivary glands: Negative Thyroid: Negative Lymph nodes: Negative Vascular: Negative Limited intracranial: Negative Visualized orbits: Negative Mastoids and visualized paranasal sinuses: Neck pain Skeleton: Status post C4-5 and C6-7 ACDF. These appear unremarkable. Upper chest: Not included. IMPRESSION: Subglottic tracheal edema extending over a 3 cm segment, beginning 3 cm below the true cords, with luminal narrowing up to 50%. This is likely related to recent intubation. No intrinsic lesion of the larynx, but mild edema of the arytenoids as well as the true and false cords is observed. No pharyngeal or epiglottic abnormalities are detected. Electronically Signed   By: Elsie Stain M.D.   On: 02/19/2016 15:10   Dg Chest Port 1 View  02/20/2016  CLINICAL DATA:  Intubation. EXAM: PORTABLE CHEST 1 VIEW COMPARISON:  Earlier this day at 2116 hour FINDINGS: Endotracheal tube 2.1 cm from the carina. Enteric tube in place, tip below the diaphragm not included in the field of view. Cardiomediastinal contours are unchanged. Left greater than right airspace opacities primarily in the perihilar regions, with minimal interval improvement. No large pleural effusion or pneumothorax. Low lung volumes persist. IMPRESSION: 1. Endotracheal tube 2.1 cm from the carina.  Enteric tube in place. 2. Low lung volumes with bilateral airspace opacities, mild improvement from prior exam. Electronically Signed   By: Rubye Oaks M.D.   On: 02/20/2016 00:03   Dg Chest  Port 1 View  02/19/2016  CLINICAL DATA:  Patient status post ET tube placement. EXAM: PORTABLE CHEST 1 VIEW COMPARISON:  Chest radiograph 02/19/2016. FINDINGS: ET tube terminates within the proximal trachea. Enteric tube courses inferior to the diaphragm. Stable cardiomegaly. Interval development of diffuse consolidation throughout the left lung with patchy areas of consolidation within the right lung. No definite pleural effusion or pneumothorax. IMPRESSION: ET tube terminates in the proximal trachea. Enteric tube courses inferior to the diaphragm. Interval development of scattered consolidation throughout the left lung and patchy opacities within the right mid lung. Findings are nonspecific and may represent aspiration, asymmetric edema, atelectasis or infection. Electronically Signed   By: Annia Belt M.D.   On: 02/19/2016 21:47   Dg Chest Port 1 View  02/19/2016  CLINICAL DATA:  Shortness of breath.  Wheezing.  Ex-smoker. EXAM: PORTABLE CHEST 1 VIEW COMPARISON:  02/16/2016. FINDINGS: Stable enlarged cardiac silhouette. Clear lungs. Thoracic spine degenerative changes. IMPRESSION: No acute abnormality.  Stable cardiomegaly. Electronically Signed   By: Beckie Salts M.D.   On: 02/19/2016 11:44   Dg Abd Portable 1v  02/19/2016  CLINICAL DATA:  OG tube placement. EXAM: PORTABLE ABDOMEN - 1 VIEW COMPARISON:  Concurrently performed chest radiograph. FINDINGS: Tip and side port of the enteric tube below the diaphragm in the stomach. No dilated bowel loops to suggest obstruction. Small volume of colonic stool. IMPRESSION: Tip and side port of the enteric tube below the diaphragm in the stomach. Electronically Signed   By: Rubye Oaks M.D.   On: 02/19/2016 21:50   STUDIES:  4/16 ct neck 4/16 ENT larynx exam  CULTURES: 4/16 bc>> 4/16 ujc>>  ANTIBIOTICS: None  SIGNIFICANT EVENTS: 4/16 stridor  LINES/TUBES: OGT- 4/16 >> ETT- 4/16 >>  DISCUSSION: 50 yo female with recent MI an LAD stent. With  recent admission for Flash pulm edema, intubated, also had NSTEMI with PCI. Pt presented with worsening stridor and difficulty breathing. Flexible laryhgoscopy in the ED by ENT- unrmarkable. CT head and neck- subglottic tracheal edema.   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 Restart Home coreg Restart statin Consulted cards for possible TEE for ?mitral valve mass and NSTEMi with 2 vessel dx.  RENAL Lab Results  Component Value Date   CREATININE 1.27* 02/20/2016   CREATININE 0.93 02/19/2016   CREATININE 0.80 02/19/2016   CREATININE 0.96 12/18/2013   A:   New mild AKI P:   Follow creatine Gentle fluids  GASTROINTESTINAL A:   GI protection P:   PPI BID D/c ppi Can start tube feeds  HEMATOLOGIC A:   DVT protection P:  Heparin  INFECTIOUS A:   No acute infection but WBC 17- likely due to steroids P:   Low threshold for antibiotics  Trend WBC  ENDOCRINE A:   DM Hyperglycemia- Dm and Steroids P:   Insulin drip- d/c Start SSI with LAntus- 15 Steroids- solumedrol  NEUROLOGIC A:   Anxiety P:   RASS goal: 0 Anxiolytics as needed Fentanyl gtt and PRN Versed PRN.  FAMILY  - Updates: Updated at bedside. Explained need for ICU till airway issue cleared 4/16  - Inter-disciplinary family meet or Palliative Care meeting due by:  day 7  Inocente Salles, MD. Danise Edge 02/20/2016

## 2016-02-21 ENCOUNTER — Inpatient Hospital Stay (HOSPITAL_COMMUNITY): Payer: BLUE CROSS/BLUE SHIELD

## 2016-02-21 DIAGNOSIS — I059 Rheumatic mitral valve disease, unspecified: Secondary | ICD-10-CM

## 2016-02-21 DIAGNOSIS — I058 Other rheumatic mitral valve diseases: Secondary | ICD-10-CM | POA: Insufficient documentation

## 2016-02-21 DIAGNOSIS — J9601 Acute respiratory failure with hypoxia: Secondary | ICD-10-CM | POA: Insufficient documentation

## 2016-02-21 LAB — GLUCOSE, CAPILLARY
GLUCOSE-CAPILLARY: 181 mg/dL — AB (ref 65–99)
GLUCOSE-CAPILLARY: 234 mg/dL — AB (ref 65–99)
GLUCOSE-CAPILLARY: 248 mg/dL — AB (ref 65–99)
Glucose-Capillary: 186 mg/dL — ABNORMAL HIGH (ref 65–99)
Glucose-Capillary: 208 mg/dL — ABNORMAL HIGH (ref 65–99)
Glucose-Capillary: 234 mg/dL — ABNORMAL HIGH (ref 65–99)

## 2016-02-21 LAB — URINE CULTURE

## 2016-02-21 LAB — BASIC METABOLIC PANEL
Anion gap: 9 (ref 5–15)
BUN: 28 mg/dL — AB (ref 6–20)
CHLORIDE: 103 mmol/L (ref 101–111)
CO2: 27 mmol/L (ref 22–32)
Calcium: 9.7 mg/dL (ref 8.9–10.3)
Creatinine, Ser: 0.93 mg/dL (ref 0.44–1.00)
GFR calc Af Amer: >60 mL/min (ref >60)
GFR calc non Af Amer: >60 mL/min (ref >60)
GLUCOSE: 255 mg/dL — AB (ref 65–99)
POTASSIUM: 5 mmol/L (ref 3.5–5.1)
Sodium: 139 mmol/L (ref 135–145)

## 2016-02-21 LAB — CBC
HCT: 38.6 % (ref 36.0–46.0)
HEMOGLOBIN: 11.4 g/dL — AB (ref 12.0–15.0)
MCH: 23.9 pg — AB (ref 26.0–34.0)
MCHC: 29.5 g/dL — ABNORMAL LOW (ref 30.0–36.0)
MCV: 80.9 fL (ref 78.0–100.0)
Platelets: 204 10*3/uL (ref 150–400)
RBC: 4.77 MIL/uL (ref 3.87–5.11)
RDW: 16.7 % — ABNORMAL HIGH (ref 11.5–15.5)
WBC: 20.7 10*3/uL — ABNORMAL HIGH (ref 4.0–10.5)

## 2016-02-21 MED ORDER — FAMOTIDINE 40 MG/5ML PO SUSR
20.0000 mg | Freq: Two times a day (BID) | ORAL | Status: DC
  Administered 2016-02-21 – 2016-02-23 (×6): 20 mg via ORAL
  Filled 2016-02-21 (×6): qty 2.5

## 2016-02-21 MED ORDER — HYDRALAZINE HCL 20 MG/ML IJ SOLN
5.0000 mg | Freq: Four times a day (QID) | INTRAMUSCULAR | Status: DC | PRN
  Administered 2016-02-21: 5 mg via INTRAVENOUS
  Filled 2016-02-21 (×2): qty 1

## 2016-02-21 MED ORDER — HYDRALAZINE HCL 20 MG/ML IJ SOLN: 5.0000 mg | Freq: Once | INTRAMUSCULAR | Status: DC

## 2016-02-21 MED ORDER — INSULIN GLARGINE 100 UNIT/ML ~~LOC~~ SOLN
20.0000 [IU] | Freq: Every day | SUBCUTANEOUS | Status: DC
  Administered 2016-02-21 – 2016-02-25 (×5): 20 [IU] via SUBCUTANEOUS
  Filled 2016-02-21 (×6): qty 0.2

## 2016-02-21 MED ORDER — HYDRALAZINE HCL 20 MG/ML IJ SOLN
10.0000 mg | Freq: Four times a day (QID) | INTRAMUSCULAR | Status: DC | PRN
  Administered 2016-02-22 – 2016-02-23 (×3): 10 mg via INTRAVENOUS
  Filled 2016-02-21 (×2): qty 1

## 2016-02-21 NOTE — Care Management Important Message (Signed)
Important Message  Patient Details  Name: [name] MRN: [id number] Date of Birth: Sep 10, 1966   Medicare Important Message Given:  Yes    [name] P Talana Slatten 02/21/2016, 12:17 PM

## 2016-02-21 NOTE — Progress Notes (Signed)
    Subjective:  Pt on ventilator but awake and responsive. No pain.   Objective:  Vital Signs in the last 24 hours: Temp:  [96.6 F (35.9 C)-97.6 F (36.4 C)] 96.6 F (35.9 C) (04/18 1131) Pulse Rate:  [51-83] 66 (04/18 1200) Resp:  [7-23] 17 (04/18 1200) BP: (128-200)/(67-122) 159/71 mmHg (04/18 1200) SpO2:  [97 %-100 %] 99 % (04/18 1200) FiO2 (%):  [40 %] 40 % (04/18 1200) Weight:  [230 lb 6.1 oz (104.5 kg)] 230 lb 6.1 oz (104.5 kg) (04/18 0500)  Intake/Output from previous day: 04/17 0701 - 04/18 0700 In: 2131.9 [I.V.:1665.9; NG/GT:366; IV Piggyback:100] Out: 1210 [Urine:1210]  Physical Exam: Pt is alert on ventilator, NAD HEENT: normal Neck: JVP - normal Lungs: CTA bilaterally CV: RRR without murmur Abd: soft, NT Ext: no edema Skin: warm/dry no rash   Lab Results:  Recent Labs  02/20/16 0222 02/21/16 0207  WBC 20.4* 20.7*  HGB 12.6 11.4*  PLT 247 204    Recent Labs  02/20/16 0222 02/21/16 0207  NA 136 139  K 4.9 5.0  CL 100* 103  CO2 26 27  GLUCOSE 292* 255*  BUN 26* 28*  CREATININE 1.27* 0.93    Recent Labs  02/19/16 1126 02/20/16 1102  TROPONINI 0.86* 0.69*   Tele: Sinus rhythm  Assessment/Plan:  1. Acute on chronic respiratory failure: management per CCM, considering trach for tracheal edema 2. Mitral valve mass: TEE scheduled for noon tomorrow (first availability on schedule) 3. Chronic combined systolic and diastolic CHF: euvolemic. Continue current Rx 4. CAD with recent PCI: continue ASA and brilinta, no active ischemia.  Tonny Bollman, M.D. 02/21/2016, 1:17 PM

## 2016-02-21 NOTE — Progress Notes (Signed)
Inpatient Diabetes Program Recommendations  AACE/ADA: New Consensus Statement on Inpatient Glycemic Control (2015)  Target Ranges:  Prepandial:   less than 140 mg/dL      Peak postprandial:   less than 180 mg/dL (1-2 hours)      Critically ill patients:  140 - 180 mg/dL   Review of Glycemic Control Results for [name], [name] (MRN [id number]) as of 02/21/2016 08:02  Ref. Range 02/20/2016 11:43 02/20/2016 16:28 02/20/2016 20:28 02/21/2016 00:18 02/21/2016 04:25  Glucose-Capillary Latest Ref Range: 65-99 mg/dL 088 (H) 110 (H) 315 (H) 248 (H) 186 (H)   Diabetes history: DM Type 2 Outpatient Diabetes medications: Lantus insulin 10 units q hs + Metformin 500 mg bid Current orders for Inpatient glycemic control: Lantus 15 units daily + Novolog moderate correction scale 0-15  Inpatient Diabetes Program Recommendations: Noted that basal insulin increased from 15 to 20 units. Please use caution when adjusting basal insulin and note if basal insulin is used to cover carbohydrates and tubefeedings stopped, patient is at risk for hypoglycemia. When tubefeedings are resumed, please consider tube feeding coverage Novolog 3 units q 4 hrs (do not give if tubefeedings held or discontinued.  Thank you, Billy Fischer. Joycelin Radloff, RN, MSN, CDE Inpatient Glycemic Control Team Team Pager [phone] (8am-5pm) 02/21/2016 8:11 AM

## 2016-02-21 NOTE — Progress Notes (Signed)
PULMONARY / CRITICAL CARE MEDICINE   Name: [name] MRN: [id number] DOB: 1966-03-28    ADMISSION DATE:  02/19/2016  Consult Date- 02/19/2016  REFERRING MD:EDP  CHIEF COMPLAINT:  STRIDOR  HISTORY OF PRESENT ILLNESS:   50 yo obese female just discharged  4/14 after being intubated for MI from LAD/diagonal blockage required stent and balloon angioplasty. She was extubated and on day of discharge she noted noisy breathing (documented in cards note) but was discharged to home. She reports her "noisy breathing became unbearable and she presents to ER\ with stridor. ENT Scoped her without obvious injury. Please note she has documented OSA with broken cpap machine x 3 years. Recent intubation, untreated OSA and ACE-I may be contributing factors to stridor. We will check CT neck, PPI bid, steroids and + or - abx. She may need intubation for airway protection.  PAST MEDICAL HISTORY :  She  has a past medical history of Diabetes mellitus; Hypertension; Sleep apnea; Flash pulmonary edema (HCC) (02/10/2016); CAD (coronary artery disease) (02/10/2016); ARF (acute respiratory failure) (HCC) (02/10/2016); and Chronic combined systolic (congestive) and diastolic (congestive) heart failure (HCC).  SUBJECTIVE:  No complaints today. Events overnight, had a mild hypothermia- 96.4 that improved. She was also seen by ENT and Cardiology. She feels ok today.   VITAL SIGNS: BP 168/85 mmHg  Pulse 51  Temp(Src) 97.6 F (36.4 C) (Oral)  Resp 18  Ht 5\' 2"  (1.575 m)  Wt 230 lb 6.1 oz (104.5 kg)  BMI 42.13 kg/m2  SpO2 100%  LMP 01/19/2016  HEMODYNAMICS:   VENTILATOR SETTINGS: Vent Mode:  [-] PRVC FiO2 (%):  [40 %] 40 % Set Rate:  [18 bmp] 18 bmp Vt Set:  [420 mL] 420 mL PEEP:  [5 cmH20] 5 cmH20 Plateau Pressure:  [17 cmH20-28 cmH20] 19 cmH20  INTAKE / OUTPUT: I/O last 3 completed shifts: In: 2350.6 [I.V.:1834.6; NG/GT:366; IV Piggyback:150] Out: 1535 [Urine:1535]  PHYSICAL EXAMINATION: General:   Intubated, not in any distress, awake and alert. Neuro: Moving all extremities HEENT:  OG tube, ETT. Pupils equal round Cardiovascular: Regular, no murmurs Lungs:  Whistling sound present on inspiration- left upper lung region. Abdomen:  obese, not tender, bs present Musculoskeletal:  intact Skin:  warm  LABS:  BMET  Recent Labs Lab 02/19/16 1615 02/20/16 0222 02/21/16 0207  NA 133* 136 139  K 4.9 4.9 5.0  CL 98* 100* 103  CO2 22 26 27   BUN 20 26* 28*  CREATININE 0.93 1.27* 0.93  GLUCOSE 304* 292* 255*    Electrolytes  Recent Labs Lab 02/19/16 1615 02/20/16 0222 02/21/16 0207  CALCIUM 9.8 9.8 9.7  MG 2.3  --   --   PHOS 3.9  --   --     CBC  Recent Labs Lab 02/19/16 1615 02/20/16 0222 02/21/16 0207  WBC 16.5* 20.4* 20.7*  HGB 13.3 12.6 11.4*  HCT 43.1 42.3 38.6  PLT 215 247 204    Coag's  Recent Labs Lab 02/15/16 0812 02/19/16 1615  APTT  --  27  INR 1.07 1.22    Sepsis Markers  Recent Labs Lab 02/19/16 1615  LATICACIDVEN 1.9  PROCALCITON <0.10    ABG  Recent Labs Lab 02/19/16 2207 02/20/16 0244  PHART 7.326* 7.433  PCO2ART 58.7* 41.7  PO2ART 243.0* 114.0*    Liver Enzymes  Recent Labs Lab 02/15/16 0812 02/16/16 0339 02/19/16 1615  AST 28 28 29   ALT 20 22 27   ALKPHOS 60 57 73  BILITOT 0.7 1.0  1.1  ALBUMIN 2.8* 3.0* 3.5    Cardiac Enzymes  Recent Labs Lab 02/19/16 1126 02/20/16 1102  TROPONINI 0.86* 0.69*    Glucose  Recent Labs Lab 02/20/16 1001 02/20/16 1143 02/20/16 1628 02/20/16 2028 02/21/16 0018 02/21/16 0425  GLUCAP 164* 153* 218* 250* 248* 186*    Imaging Dg Chest Port 1 View  02/21/2016  CLINICAL DATA:  Stridor, intubated patient, coronary artery disease, CHF, acute renal failure. EXAM: PORTABLE CHEST 1 VIEW COMPARISON:  Portable chest x-ray of February 19, 2016 FINDINGS: The lungs are well-expanded. Improvement it new in the patchy interstitial densities at the lung bases has occurred. There  is no pleural effusion or pneumothorax. The cardiac silhouette remains enlarged. The pulmonary vascularity is less engorged. The endotracheal tube tip lies 5.5 cm above the carina. The esophagogastric tube projects below the inferior margin of the image. IMPRESSION: Some interval improvement in subsegmental atelectasis or interstitial pneumonia in both lungs. Stable cardiomegaly with mild central pulmonary vascular congestion. The support tubes are in reasonable position. Electronically Signed   By: David  [place] M.D.   On: 02/21/2016 07:22   STUDIES:  4/16 ct neck 4/16 ENT larynx exam  CULTURES: 4/16 bc>> NGTD 4/16 Urine cultures>> NGTD  ANTIBIOTICS: None  SIGNIFICANT EVENTS: 4/16 admitted for stridor, No improvement with Racemic epi and Steroids 4/16 Intubated  LINES/TUBES: OGT- 4/16 >> ETT- 4/16 >>  DISCUSSION: 50 yo female with recent MI an LAD stent. With recent admission for Flash pulm edema, intubated, also had NSTEMI with PCI. Pt presented with worsening stridor and difficulty breathing. Flexible laryhgoscopy in the ED by ENT- unrmarkable. CT head and neck- subglottic tracheal edema. Evaluated by ENT- for possible Trach and Cardiology- for Possible TEE for mobile mass seen on last admission on Echo.  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 BP still elevated- PRN hydralazine  Consider increasing Home  coreg, but pt intermittently bradycardic Cont statin   RENAL Lab Results  Component Value Date   CREATININE 0.93 02/21/2016   CREATININE 1.27* 02/20/2016   CREATININE 0.93 02/19/2016   CREATININE 0.96 12/18/2013   A:   New mild AKI- Resolved P:   Follow creatine D/c Gentle fluids Watch K  GASTROINTESTINAL A:   GI protection P:   Pepcid BID D/c ppi Can start tube feeds  HEMATOLOGIC A:   DVT protection P:  Heparin  INFECTIOUS A:   No acute infection but WBC 17- likely due to steroids Chest xray infiltrates - improved compared to prior P:   Low threshold for antibiotics  Trend WBC  ENDOCRINE A:   DM Hyperglycemia- Dm and Steroids P:   SSI Increase Lantus to 20u Steroids- solumedrol CBG Q4H  NEUROLOGIC A:   Anxiety P:   RASS goal: 0 Anxiolytics as needed- xanax PRN Fentanyl gtt and PRN Versed PRN.  FAMILY  - Updates: Updated at bedside. Explained need for ICU till airway issue cleared 4/16  -  Inter-disciplinary family meet or Palliative Care meeting due by:  day 7  Inocente Salles, MD. Danise Edge 02/21/2016

## 2016-02-22 ENCOUNTER — Inpatient Hospital Stay (HOSPITAL_COMMUNITY): Payer: BLUE CROSS/BLUE SHIELD

## 2016-02-22 DIAGNOSIS — I059 Rheumatic mitral valve disease, unspecified: Secondary | ICD-10-CM

## 2016-02-22 DIAGNOSIS — I2510 Atherosclerotic heart disease of native coronary artery without angina pectoris: Secondary | ICD-10-CM

## 2016-02-22 LAB — GLUCOSE, CAPILLARY
GLUCOSE-CAPILLARY: 182 mg/dL — AB (ref 65–99)
GLUCOSE-CAPILLARY: 248 mg/dL — AB (ref 65–99)
Glucose-Capillary: 194 mg/dL — ABNORMAL HIGH (ref 65–99)
Glucose-Capillary: 218 mg/dL — ABNORMAL HIGH (ref 65–99)
Glucose-Capillary: 232 mg/dL — ABNORMAL HIGH (ref 65–99)
Glucose-Capillary: 178 mg/dL — ABNORMAL HIGH (ref 65–99)

## 2016-02-22 LAB — CBC WITH DIFFERENTIAL/PLATELET
BASOS ABS: 0 10*3/uL (ref 0.0–0.1)
Basophils Relative: 0 %
Eosinophils Absolute: 0 10*3/uL (ref 0.0–0.7)
Eosinophils Relative: 0 %
HCT: 40.9 % (ref 36.0–46.0)
Hemoglobin: 12.1 g/dL (ref 12.0–15.0)
LYMPHS ABS: 1.3 10*3/uL (ref 0.7–4.0)
LYMPHS PCT: 9 %
MCH: 23.9 pg — AB (ref 26.0–34.0)
MCHC: 29.6 g/dL — ABNORMAL LOW (ref 30.0–36.0)
MCV: 80.7 fL (ref 78.0–100.0)
Monocytes Absolute: 0.6 10*3/uL (ref 0.1–1.0)
Monocytes Relative: 4 %
NEUTROS ABS: 12.4 10*3/uL — AB (ref 1.7–7.7)
NEUTROS PCT: 86 %
PLATELETS: 187 10*3/uL (ref 150–400)
RBC: 5.07 MIL/uL (ref 3.87–5.11)
RDW: 16.7 % — ABNORMAL HIGH (ref 11.5–15.5)
WBC: 14.4 10*3/uL — AB (ref 4.0–10.5)

## 2016-02-22 LAB — BASIC METABOLIC PANEL
ANION GAP: 10 (ref 5–15)
BUN: 31 mg/dL — ABNORMAL HIGH (ref 6–20)
CHLORIDE: 102 mmol/L (ref 101–111)
CO2: 28 mmol/L (ref 22–32)
Calcium: 10.2 mg/dL (ref 8.9–10.3)
Creatinine, Ser: 0.74 mg/dL (ref 0.44–1.00)
GFR calc Af Amer: >60 mL/min (ref >60)
GLUCOSE: 230 mg/dL — AB (ref 65–99)
POTASSIUM: 4.5 mmol/L (ref 3.5–5.1)
Sodium: 140 mmol/L (ref 135–145)

## 2016-02-22 LAB — PHOSPHORUS: Phosphorus: 3 mg/dL (ref 2.5–4.6)

## 2016-02-22 LAB — MAGNESIUM: Magnesium: 2.2 mg/dL (ref 1.7–2.4)

## 2016-02-22 MED ORDER — SODIUM CHLORIDE 0.9 % IV SOLN
INTRAVENOUS | Status: DC
  Administered 2016-02-22: 10:00:00 via INTRAVENOUS

## 2016-02-22 MED ORDER — MIDAZOLAM HCL 2 MG/2ML IJ SOLN
INTRAMUSCULAR | Status: AC
  Filled 2016-02-22: qty 6

## 2016-02-22 MED ORDER — MIDAZOLAM HCL 2 MG/2ML IJ SOLN
3.0000 mg | Freq: Once | INTRAMUSCULAR | Status: AC
  Administered 2016-02-22: 3 mg via INTRAVENOUS

## 2016-02-22 MED ORDER — HYDRALAZINE HCL 20 MG/ML IJ SOLN
5.0000 mg | Freq: Three times a day (TID) | INTRAMUSCULAR | Status: DC
  Administered 2016-02-22 – 2016-02-24 (×7): 5 mg via INTRAVENOUS
  Filled 2016-02-22 (×7): qty 1

## 2016-02-22 NOTE — Progress Notes (Signed)
Placed pt back on 40% post procedure. Stable sats.  Pt remains on full support

## 2016-02-22 NOTE — Progress Notes (Signed)
Pre TEE I was given verbal order to pull 5mg  of versed and 100 mg of fentanyl. Wasted 1 mg of versed in pyxsis with Engineer, mining. During procedure MD placed a verbal order to give the full 6 mg of versed so that the patient would be more comfortable for the procedure. See MAR for charting of 3 mg versed at 1204 and 3 mg versed at 1208. Will continue to assess and monitor the pt closely.

## 2016-02-22 NOTE — Progress Notes (Signed)
PULMONARY / CRITICAL CARE MEDICINE   Name: [name] MRN: [id number] DOB: 10-20-1966    ADMISSION DATE:  02/19/2016  Consult Date- 02/19/2016  REFERRING MD:EDP  CHIEF COMPLAINT:  STRIDOR  HISTORY OF PRESENT ILLNESS:   50 yo obese female just discharged  4/14 after being intubated for MI from LAD/diagonal blockage required stent and balloon angioplasty. She was extubated and on day of discharge she noted noisy breathing (documented in cards note) but was discharged to home. She reports her "noisy breathing became unbearable and she presents to ER\ with stridor. ENT Scoped her without obvious injury. Please note she has documented OSA with broken cpap machine x 3 years. Recent intubation, untreated OSA and ACE-I may be contributing factors to stridor. We will check CT neck, PPI bid, steroids and + or - abx. She may need intubation for airway protection.  SUBJECTIVE:  No complaints. Waiting for her TEE today. Family  Present. Comfortable.   VITAL SIGNS: BP 166/87 mmHg  Pulse 56  Temp(Src) 97.8 F (36.6 C) (Oral)  Resp 18  Ht 5\' 2"  (1.575 m)  Wt 224 lb 3.3 oz (101.7 kg)  BMI 41.00 kg/m2  SpO2 100%  LMP 01/19/2016  HEMODYNAMICS:   VENTILATOR SETTINGS: Vent Mode:  [-] PRVC FiO2 (%):  [40 %] 40 % Set Rate:  [18 bmp] 18 bmp Vt Set:  [420 mL] 420 mL PEEP:  [5 cmH20] 5 cmH20 Pressure Support:  [10 cmH20] 10 cmH20 Plateau Pressure:  [20 cmH20-26 cmH20] 22 cmH20  INTAKE / OUTPUT: I/O last 3 completed shifts: In: 1926.3 [I.V.:1306.9; NG/GT:569.3; IV Piggyback:50] Out: 2225 [Urine:2225]  PHYSICAL EXAMINATION: General:  Intubated, not in any distress, awake and alert, able to write questions on her board. Neuro: Moving all extremities HEENT: ETT, Cardiovascular: Regular, no murmurs Lungs:  Clear, on vent Abdomen:  obese, not tender, bs present Musculoskeletal:  Intact, no pedal edema. Skin:  warm  LABS:  BMET  Recent Labs Lab 02/20/16 0222 02/21/16 0207  02/22/16 0233  NA 136 139 140  K 4.9 5.0 4.5  CL 100* 103 102  CO2 26 27 28   BUN 26* 28* 31*  CREATININE 1.27* 0.93 0.74  GLUCOSE 292* 255* 230*    Electrolytes  Recent Labs Lab 02/19/16 1615 02/20/16 0222 02/21/16 0207 02/22/16 0233  CALCIUM 9.8 9.8 9.7 10.2  MG 2.3  --   --  2.2  PHOS 3.9  --   --  3.0    CBC  Recent Labs Lab 02/20/16 0222 02/21/16 0207 02/22/16 0233  WBC 20.4* 20.7* 14.4*  HGB 12.6 11.4* 12.1  HCT 42.3 38.6 40.9  PLT 247 204 187    Coag's  Recent Labs Lab 02/15/16 0812 02/19/16 1615  APTT  --  27  INR 1.07 1.22    Sepsis Markers  Recent Labs Lab 02/19/16 1615  LATICACIDVEN 1.9  PROCALCITON <0.10    ABG  Recent Labs Lab 02/19/16 2207 02/20/16 0244  PHART 7.326* 7.433  PCO2ART 58.7* 41.7  PO2ART 243.0* 114.0*    Liver Enzymes  Recent Labs Lab 02/15/16 0812 02/16/16 0339 02/19/16 1615  AST 28 28 29   ALT 20 22 27   ALKPHOS 60 57 73  BILITOT 0.7 1.0 1.1  ALBUMIN 2.8* 3.0* 3.5    Cardiac Enzymes  Recent Labs Lab 02/19/16 1126 02/20/16 1102  TROPONINI 0.86* 0.69*    Glucose  Recent Labs Lab 02/21/16 0729 02/21/16 1128 02/21/16 1558 02/21/16 2044 02/22/16 0032 02/22/16 0410  GLUCAP 208* 234* 181* 234*  248* 194*   Imaging Dg Chest Port 1 View  02/22/2016  CLINICAL DATA:  Stridor. EXAM: PORTABLE CHEST 1 VIEW COMPARISON:  February 21, 2016. FINDINGS: Stable cardiomegaly. Endotracheal and nasogastric tubes are unchanged in position. No pneumothorax is noted. Mildly increased bibasilar opacities are noted most consistent with subsegmental atelectasis. No pleural effusion is noted. Bony thorax is unremarkable. IMPRESSION: Mildly increased bibasilar subsegmental atelectasis. Stable support apparatus. Electronically Signed   By: Lupita Raider, M.D.   On: 02/22/2016 07:11   STUDIES:  4/16 ct neck 4/16 ENT larynx exam  CULTURES: 4/16 bc>> NGTD 4/16 Urine cultures>> NG -  Final  ANTIBIOTICS: None  SIGNIFICANT EVENTS: 4/16 admitted for stridor, No improvement with Racemic epi and Steroids 4/16 Intubated  LINES/TUBES: OGT- 4/16 >> ETT- 4/16 >>  DISCUSSION: 50 yo female with recent MI an LAD stent. With recent admission for Flash pulm edema, intubated, also had NSTEMI with PCI. Pt presented with worsening stridor and difficulty breathing. Flexible laryhgoscopy in the ED by ENT- unremarkable. CT head and neck- subglottic tracheal edema. Evaluated by ENT- for possible Trach and Cardiology- for Possible TEE for mobile mass seen on last admission on Echo.  ASSESSMENT / PLAN:  PULMONARY A: Stridor VCD Tracheal Stenosis Untreated OSA with no cpap x 3 years Acute respiratory acidosis with resp distress due to stenosis Chest xray infiltrates P:   Steroids Doubt ACE- contribution, but holding for now SBT daily Anxiety control with clonazepam Trial of cuff leak today to evaluate possible airway edema improvement on steroids If failed cuff leak then Tracheostomy >> pt with no leak today. Check in am, if still no leak, plan for tracheostomy.  On DAPT, will need to hold for tracheostomy  CARDIOVASCULAR A:  Recent NSTEMI with CC stent, 2 v dz Stent LAD balloon diagonal  HTN Elevated Trop- from prior NSTEMI, down trending P:   Cont DAPT Needs Cards follow up in 3-4 weeks for RCA intervention Mitral valve mass- Cards recs appreciated, TEE today while intubated. BP still elevated- Scheduled hydralazine 5mg  Q8H , and cont PRN hydralazine 10mg  Q6H Cont statin  RENAL Lab Results  Component Value Date   CREATININE 0.74 02/22/2016   CREATININE 0.93 02/21/2016   CREATININE 1.27* 02/20/2016   CREATININE 0.96 12/18/2013   A:   New mild AKI- Resolved P:   Follow creatine D/c Gentle fluids Watch K  GASTROINTESTINAL A:   GI protection P:   Pepcid BID D/c ppi Can start tube feeds  HEMATOLOGIC A:   DVT protection P:  Heparin  INFECTIOUS A:    No acute infection but WBC 17- likely due to steroids Chest xray infiltrates - improved compared to prior Leukocytosis while on steroids P:   Low threshold for antibiotics  Trend WBC  ENDOCRINE A:   DM Hyperglycemia- Dm and Steroids P:   SSI Cont Lantus to 20u daily Steroids- solumedrol CBG Q4H   NEUROLOGIC A:   Anxiety P:   RASS goal: 0 Anxiolytics as needed- xanax PRN Fentanyl gtt and PRN Versed PRN.  FAMILY  - Updates: Updated at bedside. Explained need for ICU till airway issue cleared 4/16  - Inter-disciplinary family meet or Palliative Care meeting due by:  day 7  Inocente Salles, MD. Danise Edge 02/22/2016   I discussed the plan with resident. Agree with her history, PE, assessment and plan. Note above has been modified by me. Check cuff leak in am > if (-), plan for trache. TEE today.  Critical care time with [facility]  pt today : 35 minutes.  Pollie Meyer, MD 02/22/2016, 11:48 AM [facility] Pager [phone] After 3 pm or if no answer, call [phone]

## 2016-02-22 NOTE — Progress Notes (Signed)
Inpatient Diabetes Program Recommendations  AACE/ADA: New Consensus Statement on Inpatient Glycemic Control (2015)  Target Ranges:  Prepandial:   less than 140 mg/dL      Peak postprandial:   less than 180 mg/dL (1-2 hours)      Critically ill patients:  140 - 180 mg/dL   Review of Glycemic Control Results for [name], CARDIEL (MRN [id number]) as of 02/22/2016 10:51  Ref. Range 02/21/2016 11:28 02/21/2016 15:58 02/21/2016 20:44 02/22/2016 00:32 02/22/2016 04:10  Glucose-Capillary Latest Ref Range: 65-99 mg/dL 852 (H) 778 (H) 242 (H) 248 (H) 194 (H)   Diabetes history: DM Type 2 Outpatient Diabetes medications: Lantus insulin 10 units q hs + Metformin 500 mg bid Current orders for Inpatient glycemic control: Lantus 15 units daily + Novolog moderate correction scale 0-15  Inpatient Diabetes Program Recommendations:   Please consider tube feeding coverage Novolog 3 units q 4 hrs (do not give if tubefeedings held or discontinued).   Thank you, Billy Fischer. Regenia Erck, RN, MSN, CDE Inpatient Glycemic Control Team Team Pager [phone] (8am-5pm) 02/22/2016 10:51 AM

## 2016-02-22 NOTE — Progress Notes (Signed)
    Subjective:  No CP or dyspnea.  Objective:  Vital Signs in the last 24 hours: Temp:  [96.6 F (35.9 C)-98.1 F (36.7 C)] 97.8 F (36.6 C) (04/19 0800) Pulse Rate:  [51-94] 62 (04/19 0857) Resp:  [13-26] 18 (04/19 0800) BP: (135-194)/(66-97) 166/87 mmHg (04/19 0857) SpO2:  [99 %-100 %] 100 % (04/19 0857) FiO2 (%):  [40 %] 40 % (04/19 0857) Weight:  [224 lb 3.3 oz (101.7 kg)] 224 lb 3.3 oz (101.7 kg) (04/19 0500)  Intake/Output from previous day: 04/18 0701 - 04/19 0700 In: 747.3 [I.V.:371.9; NG/GT:375.3] Out: 1675 [Urine:1675]  Physical Exam: Pt is alert and oriented, on ventilator, in NAD HEENT: normal Lungs: CTA bilaterally CV: RRR without murmur or gallop Abd: soft, NT Ext: no C/C/E  Lab Results:  Recent Labs  02/21/16 0207 02/22/16 0233  WBC 20.7* 14.4*  HGB 11.4* 12.1  PLT 204 187    Recent Labs  02/21/16 0207 02/22/16 0233  NA 139 140  K 5.0 4.5  CL 103 102  CO2 27 28  GLUCOSE 255* 230*  BUN 28* 31*  CREATININE 0.93 0.74    Recent Labs  02/19/16 1126 02/20/16 1102  TROPONINI 0.86* 0.69*    Tele: Sinus rhythm  Assessment/Plan:  1. Acute on chronic respiratory failure: management per CCM, considering extubation after TEE today 2. 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 No clinical evidence of endocarditis 3. Chronic combined systolic and diastolic CHF: euvolemic. Continue current Rx 4. CAD with recent PCI: continue ASA and brilinta, no active ischemia.  Tonny Bollman, M.D. 02/22/2016, 9:15 AM

## 2016-02-22 NOTE — Progress Notes (Signed)
Increased FIO2 to 100% and placed pt on full support for procedure. ETT secure. No breakdown noted

## 2016-02-22 NOTE — Brief Op Note (Signed)
Bedside TEE:  During this procedure the patient is administered a total of Versed 5 mg and Fentanyl 50 mg to achieve and maintain moderate conscious sedation.  The patient's heart rate, blood pressure, and oxygen saturation are monitored continuously during the procedure. The period of conscious sedation is 30 minutes, of which I was present face-to-face 100% of this time. Patient in CCU on vent:  Normal MV no mass Normal AV Normal TV mild TR Normal PV EF 60% No ASD/PFO No LAA thrombus No aortic debris Normal RV NO effusion  [name]

## 2016-02-22 NOTE — Progress Notes (Signed)
Per MD at bedside for RT to do cuff leak. Deep oral sxn done. Deflated cuff. Pt has negative cuff leak. Inflated cuff. Cuff pressure 26. Pt tol well. RT report results to MD

## 2016-02-22 NOTE — Progress Notes (Signed)
ETT secure. No breakdown noted. Placed pt on cpap/ps with no complications. Pt tol well.

## 2016-02-23 ENCOUNTER — Inpatient Hospital Stay (HOSPITAL_COMMUNITY): Payer: BLUE CROSS/BLUE SHIELD

## 2016-02-23 DIAGNOSIS — I10 Essential (primary) hypertension: Secondary | ICD-10-CM

## 2016-02-23 LAB — GLUCOSE, CAPILLARY
GLUCOSE-CAPILLARY: 173 mg/dL — AB (ref 65–99)
GLUCOSE-CAPILLARY: 178 mg/dL — AB (ref 65–99)
GLUCOSE-CAPILLARY: 198 mg/dL — AB (ref 65–99)
GLUCOSE-CAPILLARY: 211 mg/dL — AB (ref 65–99)
Glucose-Capillary: 223 mg/dL — ABNORMAL HIGH (ref 65–99)
Glucose-Capillary: 139 mg/dL — ABNORMAL HIGH (ref 65–99)

## 2016-02-23 LAB — CBC
HEMATOCRIT: 41.1 % (ref 36.0–46.0)
HEMOGLOBIN: 12 g/dL (ref 12.0–15.0)
MCH: 23.5 pg — ABNORMAL LOW (ref 26.0–34.0)
MCHC: 29.2 g/dL — ABNORMAL LOW (ref 30.0–36.0)
MCV: 80.4 fL (ref 78.0–100.0)
Platelets: 200 10*3/uL (ref 150–400)
RBC: 5.11 MIL/uL (ref 3.87–5.11)
RDW: 16.9 % — AB (ref 11.5–15.5)
WBC: 13.7 10*3/uL — AB (ref 4.0–10.5)

## 2016-02-23 LAB — BASIC METABOLIC PANEL
ANION GAP: 10 (ref 5–15)
BUN: 29 mg/dL — AB (ref 6–20)
CHLORIDE: 99 mmol/L — AB (ref 101–111)
CO2: 29 mmol/L (ref 22–32)
Calcium: 10.2 mg/dL (ref 8.9–10.3)
Creatinine, Ser: 0.75 mg/dL (ref 0.44–1.00)
GFR calc Af Amer: >60 mL/min (ref >60)
GLUCOSE: 208 mg/dL — AB (ref 65–99)
POTASSIUM: 4.3 mmol/L (ref 3.5–5.1)
SODIUM: 138 mmol/L (ref 135–145)

## 2016-02-23 LAB — PHOSPHORUS: PHOSPHORUS: 3.7 mg/dL (ref 2.5–4.6)

## 2016-02-23 LAB — MAGNESIUM: Magnesium: 2.3 mg/dL (ref 1.7–2.4)

## 2016-02-23 MED ORDER — CARVEDILOL 6.25 MG PO TABS
6.2500 mg | ORAL_TABLET | Freq: Once | ORAL | Status: AC
  Administered 2016-02-23: 6.25 mg via ORAL
  Filled 2016-02-23: qty 1

## 2016-02-23 MED ORDER — CETYLPYRIDINIUM CHLORIDE 0.05 % MT LIQD
7.0000 mL | Freq: Two times a day (BID) | OROMUCOSAL | Status: DC
Start: 2016-02-23 — End: 2016-02-25
  Administered 2016-02-24 – 2016-02-25 (×2): 7 mL via OROMUCOSAL

## 2016-02-23 MED ORDER — CARVEDILOL 12.5 MG PO TABS
12.5000 mg | ORAL_TABLET | Freq: Two times a day (BID) | ORAL | Status: DC
  Administered 2016-02-23 – 2016-02-25 (×4): 12.5 mg via ORAL
  Filled 2016-02-23 (×4): qty 1

## 2016-02-23 NOTE — Progress Notes (Signed)
PULMONARY / CRITICAL CARE MEDICINE   Name: [name] MRN: [id number] DOB: 1966-01-02    ADMISSION DATE:  02/19/2016  Consult Date- 02/19/2016  REFERRING MD:EDP  CHIEF COMPLAINT:  STRIDOR  HISTORY OF PRESENT ILLNESS:   50 yo obese female just discharged  4/14 after being intubated for MI from LAD/diagonal blockage required stent and balloon angioplasty. She was extubated and on day of discharge she noted noisy breathing (documented in cards note) but was discharged to home. She reports her "noisy breathing became unbearable and she presents to ER\ with stridor. ENT Scoped her without obvious injury. Please note she has documented OSA with broken cpap machine x 3 years. Recent intubation, untreated OSA and ACE-I may be contributing factors to stridor. We will check CT neck, PPI bid, steroids and + or - abx. She may need intubation for airway protection.  SUBJECTIVE:  Pt with a cuff leak this am. Doing well on PST. Comfortable.   VITAL SIGNS: BP 180/82 mmHg  Pulse 63  Temp(Src) 97.6 F (36.4 C) (Oral)  Resp 13  Ht 5\' 2"  (1.575 m)  Wt 222 lb 3.6 oz (100.8 kg)  BMI 40.63 kg/m2  SpO2 99%  LMP 01/19/2016  HEMODYNAMICS:   VENTILATOR SETTINGS: Vent Mode:  [-] PSV;CPAP FiO2 (%):  [40 %-100 %] 40 % Set Rate:  [18 bmp] 18 bmp Vt Set:  [420 mL] 420 mL PEEP:  [5 cmH20] 5 cmH20 Pressure Support:  [5 cmH20] 5 cmH20 Plateau Pressure:  [18 cmH20-26 cmH20] 26 cmH20  INTAKE / OUTPUT: I/O last 3 completed shifts: In: 1437 [I.V.:767; Other:30; NG/GT:640] Out: 3300 [Urine:3300]  PHYSICAL EXAMINATION: General:  Intubated, not in any distress, awake and alert, able to write questions on her board. Neuro: Moving all extremities HEENT: ETT, Cardiovascular: Regular, no murmurs Lungs:  Clear, on vent Abdomen:  obese, not tender, bs present Musculoskeletal:  Intact, no pedal edema. Skin:  warm  LABS:  BMET  Recent Labs Lab 02/21/16 0207 02/22/16 0233 02/23/16 0213  NA 139 140 138   K 5.0 4.5 4.3  CL 103 102 99*  CO2 27 28 29   BUN 28* 31* 29*  CREATININE 0.93 0.74 0.75  GLUCOSE 255* 230* 208*    Electrolytes  Recent Labs Lab 02/19/16 1615  02/21/16 0207 02/22/16 0233 02/23/16 0213  CALCIUM 9.8  < > 9.7 10.2 10.2  MG 2.3  --   --  2.2 2.3  PHOS 3.9  --   --  3.0 3.7  < > = values in this interval not displayed.  CBC  Recent Labs Lab 02/21/16 0207 02/22/16 0233 02/23/16 0213  WBC 20.7* 14.4* 13.7*  HGB 11.4* 12.1 12.0  HCT 38.6 40.9 41.1  PLT 204 187 200    Coag's  Recent Labs Lab 02/19/16 1615  APTT 27  INR 1.22    Sepsis Markers  Recent Labs Lab 02/19/16 1615  LATICACIDVEN 1.9  PROCALCITON <0.10    ABG  Recent Labs Lab 02/19/16 2207 02/20/16 0244  PHART 7.326* 7.433  PCO2ART 58.7* 41.7  PO2ART 243.0* 114.0*    Liver Enzymes  Recent Labs Lab 02/19/16 1615  AST 29  ALT 27  ALKPHOS 73  BILITOT 1.1  ALBUMIN 3.5    Cardiac Enzymes  Recent Labs Lab 02/19/16 1126 02/20/16 1102  TROPONINI 0.86* 0.69*    Glucose  Recent Labs Lab 02/22/16 0808 02/22/16 1154 02/22/16 1532 02/22/16 1958 02/23/16 0048 02/23/16 0316  GLUCAP 218* 232* 178* 182* 211* 173*   Imaging  Dg Chest Port 1 View  02/23/2016  CLINICAL DATA:  Acute respiratory failure, stridor, Coronary artery disease, diabetes. EXAM: PORTABLE CHEST 1 VIEW COMPARISON:  Portable chest x-ray of February 22, 2016 FINDINGS: The lungs are reasonably well inflated. Basilar densities have improved. The cardiac silhouette remains enlarged. The pulmonary vascularity is mildly prominent centrally. There is no significant pleural effusion. The endotracheal tube tip projects approximately 4.9 cm above the carina. The esophagogastric tube tip projects below the inferior margin of the image. IMPRESSION: Improved bibasilar atelectasis or pneumonia. Stable cardiomegaly with mild central pulmonary vascular congestion. The support tubes are in stable position. Electronically  Signed   By: David  [place] M.D.   On: 02/23/2016 07:08   STUDIES:  4/16 ct neck > subglottic tracheal edema 2/2 recent intubation 4/16 ENT larynx exam  CULTURES: 4/16 bc>> NGTD 4/16 Urine cultures>> NG - Final  ANTIBIOTICS: None  SIGNIFICANT EVENTS: 4/16 admitted for stridor, No improvement with Racemic epi and Steroids 4/16 Intubated  LINES/TUBES: OGT- 4/16 >> ETT- 4/16 >>  DISCUSSION: 50 yo female with recent MI an LAD stent. With recent admission for Flash pulm edema, intubated, also had NSTEMI with PCI. Pt presented with worsening stridor and difficulty breathing. Flexible laryhgoscopy in the ED by ENT- unremarkable. CT head and neck- subglottic tracheal edema. Evaluated by ENT- for possible Trach and Cardiology- for Possible TEE for mobile mass seen on last admission on Echo.  ASSESSMENT / PLAN:  PULMONARY A: Stridor VCD Tracheal Stenosis Untreated OSA with no cpap x 3 years Acute respiratory acidosis with resp distress due to stenosis P:   Doing well on PST with cuff leak. Plan to extubate now.  Anesthesia has been notified of potential difficult re intubation 2/2 tracheal edema.  Family aware. If pt develops stridor, will need to be reintubated and plan for tracheostomey.  Cont Steroids Holding off on ace inhibitor    CARDIOVASCULAR A:  Recent NSTEMI with CC stent, 2 v dz Stent LAD balloon diagonal  HTN Elevated Trop- from prior NSTEMI, down trending P:   Cont DAPT Needs Cards follow up in 3-4 weeks for RCA intervention TEE did not reveal any mitral masses. . Cont coreg, hydralazine.  Cont statin  RENAL Lab Results  Component Value Date   CREATININE 0.75 02/23/2016   CREATININE 0.74 02/22/2016   CREATININE 0.93 02/21/2016   CREATININE 0.96 12/18/2013   A:   New mild AKI- Resolved P:   Follow creatine  GASTROINTESTINAL A:   GI protection P:   Pepcid BID Cont tube feeds > swallow evaln when extubated  HEMATOLOGIC A:   DVT protection P:   Heparin  INFECTIOUS A:   No acute infection but WBC 17- likely due to steroids Chest xray infiltrates - improved compared to prior Leukocytosis while on steroids P:   Low threshold for antibiotics  Trend WBC  ENDOCRINE A:   DM Hyperglycemia- Dm and Steroids P:   SSI Cont Lantus to 20u daily Steroids- solumedrol CBG Q4H   NEUROLOGIC A:   Anxiety P:   RASS goal: 0 Anxiolytics as needed- xanax PRN Fentanyl gtt and PRN Versed PRN.  FAMILY  - Updates: Updated at bedside. Aware of extubation being very risky. They agree with the plan.   - Inter-disciplinary family meet or Palliative Care meeting due by:  day 7  Critical care time with this  pt today : 35 minutes.  Pollie Meyer, MD 02/23/2016, 9:00 AM [facility] Pager [phone]  1310 After 3 pm or if no answer, call [phone]

## 2016-02-23 NOTE — Progress Notes (Signed)
    Subjective:  No c/o this am. Awake on vent. No CP.  Objective:  Vital Signs in the last 24 hours: Temp:  [97.6 F (36.4 C)-98.7 F (37.1 C)] 97.6 F (36.4 C) (04/20 0759) Pulse Rate:  [47-90] 63 (04/20 0800) Resp:  [11-19] 13 (04/20 0800) BP: (120-241)/(64-99) 180/82 mmHg (04/20 0800) SpO2:  [97 %-100 %] 99 % (04/20 0800) FiO2 (%):  [40 %-100 %] 40 % (04/20 0800) Weight:  [222 lb 3.6 oz (100.8 kg)] 222 lb 3.6 oz (100.8 kg) (04/20 0400)  Intake/Output from previous day: 04/19 0701 - 04/20 0700 In: 1117 [I.V.:647; NG/GT:440] Out: 2250 [Urine:2250]  Physical Exam: Pt is alert on ventilator, in NAD HEENT: normal Neck: JVP - unable to visualize Lungs: CTA bilaterally CV: RRR without murmur or gallop Abd: soft, NT, Positive BS Ext: no C/C/E, distal pulses intact and equal Skin: warm/dry no rash   Lab Results:  Recent Labs  02/22/16 0233 02/23/16 0213  WBC 14.4* 13.7*  HGB 12.1 12.0  PLT 187 200    Recent Labs  02/22/16 0233 02/23/16 0213  NA 140 138  K 4.5 4.3  CL 102 99*  CO2 28 29  GLUCOSE 230* 208*  BUN 31* 29*  CREATININE 0.74 0.75    Recent Labs  02/20/16 1102  TROPONINI 0.69*    Cardiac Studies: TEE: Study Conclusions  - Left ventricle: Hypertrophy was noted. Systolic function was  normal. The estimated ejection fraction was in the range of 55%  to 60%. - Aortic valve: No evidence of vegetation. - Mitral valve: No mass or vegetation seen. No evidence of  vegetation. - Right atrium: No evidence of thrombus in the atrial cavity or  appendage. - Atrial septum: No defect or patent foramen ovale was identified. - Pulmonic valve: No evidence of vegetation  Tele: Sinus rhythm  Assessment/Plan:  1. Acute on chronic respiratory failure: management per CCM - likely extubation today if airway issues ok  2. Mitral valve abnormality: TEE showed no evidence of mass or endocarditis  3. Chronic combined systolic and diastolic CHF:  euvolemic. Stable from cardiac perspective.  4. CAD with recent PCI: continue ASA and brilinta, no active ischemia. Will need staged PCI at some point, but this is on the 'back burner' for now as airway issues are much more pressing. Will arrange at some point after outpatient follow-up.  5. HTN, uncontrolled: no ACE/ARB secondary to airway concerns. Will increase carvedilol today. Add amlodipine if BP remains elevated.   Tonny Bollman, M.D. 02/23/2016, 8:55 AM

## 2016-02-23 NOTE — Procedures (Signed)
Extubation Procedure Note  Patient Details:   Name: [name] DOB: 1966/06/01 MRN: [id number]   Airway Documentation:     Evaluation  O2 sats: stable throughout Complications: No apparent complications Patient did tolerate procedure well. Bilateral Breath Sounds: Diminished   Yes  Patient tolerated wean. Positive for cuff leak. MD ordered to extubate. Patient extubated to a 3 Lpm nasal cannula. No signs of stridor or dyspnea. Patient instructed on the Incentive Spirometer achieving , five times. RN at bedside. Will continue to monitor.   Ancil Boozer 02/23/2016, 9:26 AM

## 2016-02-23 NOTE — Progress Notes (Signed)
Pt extub today. Spoke w pt and left 30day free and copay card for brilinta. Pt has bcbs ins.

## 2016-02-24 DIAGNOSIS — G4733 Obstructive sleep apnea (adult) (pediatric): Secondary | ICD-10-CM

## 2016-02-24 LAB — CULTURE, BLOOD (ROUTINE X 2)
CULTURE: NO GROWTH
Culture: NO GROWTH

## 2016-02-24 LAB — GLUCOSE, CAPILLARY
GLUCOSE-CAPILLARY: 175 mg/dL — AB (ref 65–99)
GLUCOSE-CAPILLARY: 184 mg/dL — AB (ref 65–99)
GLUCOSE-CAPILLARY: 344 mg/dL — AB (ref 65–99)
Glucose-Capillary: 179 mg/dL — ABNORMAL HIGH (ref 65–99)
Glucose-Capillary: 180 mg/dL — ABNORMAL HIGH (ref 65–99)
Glucose-Capillary: 210 mg/dL — ABNORMAL HIGH (ref 65–99)

## 2016-02-24 MED ORDER — FAMOTIDINE 20 MG PO TABS
20.0000 mg | ORAL_TABLET | Freq: Two times a day (BID) | ORAL | Status: DC
  Administered 2016-02-24 – 2016-02-25 (×2): 20 mg via ORAL
  Filled 2016-02-24 (×3): qty 1

## 2016-02-24 MED ORDER — ADULT MULTIVITAMIN W/MINERALS CH
1.0000 | ORAL_TABLET | Freq: Every day | ORAL | Status: DC
  Administered 2016-02-24 – 2016-02-25 (×2): 1 via ORAL
  Filled 2016-02-24: qty 1

## 2016-02-24 MED ORDER — FAMOTIDINE 20 MG PO TABS
20.0000 mg | ORAL_TABLET | Freq: Two times a day (BID) | ORAL | Status: DC
  Administered 2016-02-24: 20 mg via ORAL
  Filled 2016-02-24: qty 1

## 2016-02-24 MED ORDER — AMLODIPINE BESYLATE 5 MG PO TABS
5.0000 mg | ORAL_TABLET | Freq: Every day | ORAL | Status: DC
  Administered 2016-02-24 – 2016-02-25 (×2): 5 mg via ORAL
  Filled 2016-02-24 (×2): qty 1

## 2016-02-24 MED ORDER — ASPIRIN EC 81 MG PO TBEC
81.0000 mg | DELAYED_RELEASE_TABLET | Freq: Every day | ORAL | Status: DC
  Administered 2016-02-24 – 2016-02-25 (×2): 81 mg via ORAL
  Filled 2016-02-24 (×2): qty 1

## 2016-02-24 MED ORDER — PREDNISONE 20 MG PO TABS
40.0000 mg | ORAL_TABLET | Freq: Every day | ORAL | Status: DC
  Administered 2016-02-25: 40 mg via ORAL
  Filled 2016-02-24: qty 2

## 2016-02-24 MED ORDER — PRO-STAT SUGAR FREE PO LIQD: 30.0000 mL | Freq: Three times a day (TID) | ORAL | Status: DC

## 2016-02-24 NOTE — Progress Notes (Signed)
PULMONARY / CRITICAL CARE MEDICINE   Name: [name] MRN: [id number] DOB: [birth year]    ADMISSION DATE:  02/19/2016  Consult Date- 02/19/2016  REFERRING MD:EDP  CHIEF COMPLAINT:  STRIDOR  HISTORY OF PRESENT ILLNESS:   50 yo obese female just discharged  4/14 after being intubated for MI from LAD/diagonal blockage required stent and balloon angioplasty. She was extubated and on day of discharge she noted noisy breathing (documented in cards note) but was discharged to home. She reports her "noisy breathing became unbearable and she presents to ER\ with stridor. ENT Scoped her without obvious injury. Please note she has documented OSA with broken cpap machine x 3 years. Recent intubation, untreated OSA and ACE-I may be contributing factors to stridor. We will check CT neck, PPI bid, steroids and + or - abx. She may need intubation for airway protection.  SUBJECTIVE:  Extubated on 4/20 > no issues. Tolerating extubation and eating well. Used cpap.  No issues overnight.    VITAL SIGNS: BP 144/69 mmHg  Pulse 56  Temp(Src) 97.9 F (36.6 C) (Oral)  Resp 18  Ht  (1.575 m)  Wt 231 lb 7.7 oz (105 kg)  BMI 42.33 kg/m2  SpO2 97%  LMP 01/19/2016  HEMODYNAMICS:   VENTILATOR SETTINGS: Vent Mode:  [-] CPAP;PCV FiO2 (%):  [40 %] 40 % Set Rate:  [10 bmp] 10 bmp PEEP:  [6 cmH20] 6 cmH20  INTAKE / OUTPUT: I/O last 3 completed shifts: In: 1110 [I.V.:580; Other:30; NG/GT:500] Out: 1575 [Urine:1575]  PHYSICAL EXAMINATION: General:  Awake, oriented x3, follows commands. Not in distress.  Neuro: Moving all extremities HEENT: ETT, Cardiovascular: Regular, no murmurs Lungs:  Bibasilar crackles.  Abdomen:  obese, not tender, bs present Musculoskeletal:  Intact, no pedal edema. Skin:  warm  LABS:  BMET  Recent Labs Lab 02/21/16 0207 02/22/16 0233 02/23/16 0213  NA 139 140 138  K 5.0 4.5 4.3  CL 103 102 99*  CO2 BUN 28* 31* 29*  CREATININE 0.93 0.74 0.75   GLUCOSE 255* 230* 208*    Electrolytes  Recent Labs Lab 02/19/16 1615  02/21/16 0207 02/22/16 0233 02/23/16 0213  CALCIUM 9.8  < > 9.7 10.2 10.2  MG 2.3  --   --  2.2 2.3  PHOS 3.9  --   --  3.0 3.7  < > = values in this interval not displayed.  CBC  Recent Labs Lab 02/21/16 0207 02/22/16 0233 02/23/16 0213  WBC 20.7* 14.4* 13.7*  HGB 11.4* 12.1 12.0  HCT 38.6 40.9 41.1  PLT 204 187 200    Coag's  Recent Labs Lab 02/19/16 1615  APTT 27  INR 1.22    Sepsis Markers  Recent Labs Lab 02/19/16 1615  LATICACIDVEN 1.9  PROCALCITON <0.10    ABG  Recent Labs Lab 02/19/16 2207 02/20/16 0244  PHART 7.326* 7.433  PCO2ART 58.7* 41.7  PO2ART 243.0* 114.0*    Liver Enzymes  Recent Labs Lab 02/19/16 1615  AST 29  ALT 27  ALKPHOS 73  BILITOT 1.1  ALBUMIN 3.5    Cardiac Enzymes  Recent Labs Lab 02/19/16 1126 02/20/16 1102  TROPONINI 0.86* 0.69*    Glucose  Recent Labs Lab 02/23/16 0758 02/23/16 1124 02/23/16 1552 02/23/16 2012 02/23/16 2343 02/24/16 0813  GLUCAP 223* 198* 139* 178* 184* 175*   Imaging No results found. STUDIES:  4/16 ct neck > subglottic tracheal edema 2/2 recent intubation 4/16 ENT larynx exam  CULTURES: 4/16 bc>>  NGTD 4/16 Urine cultures>> NG - Final  ANTIBIOTICS: None  SIGNIFICANT EVENTS: 4/16 admitted for stridor, No improvement with Racemic epi and Steroids 4/16 Intubated 4/20 extubated  LINES/TUBES: OGT- 4/16 >> ETT- 4/16 >>  DISCUSSION: 50 yo female with recent MI an LAD stent. With recent admission for Flash pulm edema, intubated, also had NSTEMI with PCI. Pt presented with worsening stridor and difficulty breathing. Flexible laryhgoscopy in the ED by ENT- unremarkable. CT head and neck- subglottic tracheal edema. Evaluated by ENT- for possible Trach and Cardiology- for Possible TEE for mobile mass seen on last admission on Echo.  ASSESSMENT / PLAN:  PULMONARY A: Stridor VCD Tracheal  Stenosis Untreated OSA with no cpap x 3 years Acute respiratory acidosis with resp distress due to stenosis S/P extubation on 4/20 P:   Doing well post extubation.  Transfer to telemetry. Holding off on ace inhibitor for now. Plan : prednisone taper 40 mg x 3 days, taper down by 10 mg q 3 days. Will need  7-10 days pred taper 2/2 severity of tracheal edema.  Bipap at hs for OSA. Needs cpap as oupt > pt needs f/u regarding this. Was not compliant before.   CARDIOVASCULAR A:  Recent NSTEMI with CC stent, 2 v dz Stent LAD balloon diagonal  HTN Elevated Trop- from prior NSTEMI, down trending P:   Cont DAPT Needs Cards follow up in 3-4 weeks for RCA intervention TEE did not reveal any mitral masses. . Cont coreg, start norvasc. D/c hydralazine.   Cont statin  RENAL Lab Results  Component Value Date   CREATININE 0.75 02/23/2016   CREATININE 0.74 02/22/2016   CREATININE 0.93 02/21/2016   CREATININE 0.96 12/18/2013   A:   New mild AKI- Resolved P:   Follow creatine  GASTROINTESTINAL A:   GI protection P:   Pepcid BID Advance diet as tolerated  HEMATOLOGIC A:   DVT protection P:  Heparin  INFECTIOUS A:   No acute infection but WBC 17- likely due to steroids Chest xray infiltrates - improved compared to prior Leukocytosis while on steroids P:   Low threshold for antibiotics  Trend WBC  ENDOCRINE A:   DM Hyperglycemia- Dm and Steroids P:   SSI Cont Lantus to  daily Steroids- solumedrol > wean off as discussed above.     NEUROLOGIC A:   Anxiety P:   RASS goal: 0 Anxiolytics as needed- xanax PRN  FAMILY  - Updates: no family around.   Pt doing well. Transfer to telemetry. Will transfer to [facility]MT[facility]. PCCM will sign off starting 4/22.   Pollie Meyer, MD 02/24/2016, 10:47 AM [facility] Pager [phone] After 3 pm or if no answer, call [phone]

## 2016-02-24 NOTE — Progress Notes (Signed)
Pt had 8 beat run of V-tach. Pt assessed. No apparent sign of distress. VSS. No chest pain. Pt resting in bed watching TV. Will page on call MD to make the medical team aware. No new orders given.Will continue to monitor

## 2016-02-24 NOTE — Progress Notes (Signed)
Nutrition Follow-up  DOCUMENTATION CODES:   Morbid obesity  INTERVENTION:   -Continue with Heart Healthy/ Carb Modified diet  NUTRITION DIAGNOSIS:   Inadequate oral intake related to inability to eat as evidenced by NPO status.  Resolved  GOAL:   Patient will meet greater than or equal to 90% of their needs  Met  MONITOR:   PO intake, Labs, Weight trends, Skin, I & O's  REASON FOR ASSESSMENT:   Consult Enteral/tube feeding initiation and management  ASSESSMENT:   50 yo female with recent MI an LAD stent. With recent admission for Flash pulm edema, intubated, also had NSTEMI with PCI. Pt presented with worsening stridor and difficulty breathing.  Pt extubated on 02/23/16. Pt transferred from ICU to floor on 02/24/16.   Spoke with pt, who was sitting up on the side of bed at time of visit. She reports her appetite is good and denies any swallowing difficulties. Pt consumed about 50% of her lunch meal. Pt reports that she had some difficulty chewing the lettuce, but consumed all of her chicken and strawberries off of her salad. Noted that pt has multiple missing teeth and reports that she had difficulty swallowing PTA due to not chewing her food adequately. This RD offered diet downgrade for ease of intake, however, pt politely declined ("my husband can chop it up for me").   Pt denies any further questions or concerns at this time, but expressed appreciation for visit.   Labs reviewed.   Diet Order:  Diet heart healthy/carb modified Room service appropriate?: Yes; Fluid consistency:: Thin  Skin:  Reviewed, no issues  Last BM:  PTA  Height:   Ht Readings from Last 1 Encounters:  02/19/16 _0  (1.575 m)    Weight:   Wt Readings from Last 1 Encounters:  02/24/16 231 lb 7.7 oz (105 kg)    Ideal Body Weight:  50 kg  BMI:  Body mass index is 42.33 kg/(m^2).  Estimated Nutritional Needs:   Kcal:  0016-4290  Protein:  >/= 125 grams  Fluid:  > 1.5  L/day  EDUCATION NEEDS:   No education needs identified at this time  Cataleia Gade A. Jimmye Norman, RD, LDN, CDE Pager: [phone] After hours Pager: [phone]

## 2016-02-24 NOTE — Progress Notes (Signed)
    Subjective:  No chest pain or shortness of breath.   Objective:  Vital Signs in the last 24 hours: Temp:  [96 F (35.6 C)-98.6 F (37 C)] 97.9 F (36.6 C) (04/21 0814) Pulse Rate:  [46-78] 56 (04/21 0814) Resp:  [12-19] 18 (04/21 0814) BP: (118-165)/(48-85) 144/69 mmHg (04/21 0814) SpO2:  [87 %-100 %] 97 % (04/21 0814) FiO2 (%):  [40 %] 40 % (04/21 0015) Weight:  [231 lb 7.7 oz (105 kg)] 231 lb 7.7 oz (105 kg) (04/21 0338)  Intake/Output from previous day: 04/20 0701 - 04/21 0700 In: 250 [I.V.:190; NG/GT:60] Out: 525 [Urine:525]  Physical Exam: Pt is alert and oriented, NAD HEENT: normal Neck: JVP - normal Lungs: CTA bilaterally CV: RRR without murmur or gallop Abd: soft, NT, Positive BS, no hepatomegaly Ext: no C/C/E Skin: warm/dry no rash   Lab Results:  Recent Labs  02/22/16 0233 02/23/16 0213  WBC 14.4* 13.7*  HGB 12.1 12.0  PLT 187 200    Recent Labs  02/22/16 0233 02/23/16 0213  NA 140 138  K 4.5 4.3  CL 102 99*  CO2 28 29  GLUCOSE 230* 208*  BUN 31* 29*  CREATININE 0.74 0.75   No results for input(s): TROPONINI in the last 72 hours.  Invalid input(s): CK, MB  Tele: Normal sinus rhythm  Assessment/Plan:  1. Acute on chronic respiratory failure: improved, extubated.   2. Chronic combined systolic and diastolic heart failure: stable on current Rx  3. CAD with multivessel disease, recent PCI: tolerating current Rx with ASA and brilinta. Follow-up scheduled with Dr Kirke Corin 5/2 to discuss timing of staged PCI. No ischemic sx's at present.   4. HTN, uncontrolled: BP better on higher-dose coreg. Continue same. Some bradycardia overnight but asymptomatic.  Pt stable from cardiac perspective. Follow-up arranged as above. Please call back if any active issues arise. Cecille Po, M.D. 02/24/2016, 9:01 AM

## 2016-02-25 DIAGNOSIS — Z794 Long term (current) use of insulin: Secondary | ICD-10-CM

## 2016-02-25 DIAGNOSIS — E118 Type 2 diabetes mellitus with unspecified complications: Secondary | ICD-10-CM

## 2016-02-25 DIAGNOSIS — I214 Non-ST elevation (NSTEMI) myocardial infarction: Secondary | ICD-10-CM

## 2016-02-25 LAB — BASIC METABOLIC PANEL
Anion gap: 13 (ref 5–15)
BUN: 35 mg/dL — AB (ref 6–20)
CALCIUM: 9.7 mg/dL (ref 8.9–10.3)
CO2: 24 mmol/L (ref 22–32)
CREATININE: 0.93 mg/dL (ref 0.44–1.00)
Chloride: 95 mmol/L — ABNORMAL LOW (ref 101–111)
GFR calc non Af Amer: >60 mL/min (ref >60)
GLUCOSE: 151 mg/dL — AB (ref 65–99)
Potassium: 3.8 mmol/L (ref 3.5–5.1)
Sodium: 132 mmol/L — ABNORMAL LOW (ref 135–145)

## 2016-02-25 LAB — MAGNESIUM: Magnesium: 2.1 mg/dL (ref 1.7–2.4)

## 2016-02-25 LAB — GLUCOSE, CAPILLARY
Glucose-Capillary: 183 mg/dL — ABNORMAL HIGH (ref 65–99)
Glucose-Capillary: 168 mg/dL — ABNORMAL HIGH (ref 65–99)
Glucose-Capillary: 216 mg/dL — ABNORMAL HIGH (ref 65–99)

## 2016-02-25 MED ORDER — INSULIN GLARGINE 100 UNIT/ML ~~LOC~~ SOLN: 20.0000 [IU] | Freq: Every day | SUBCUTANEOUS | Status: DC

## 2016-02-25 MED ORDER — PREDNISONE 10 MG PO TABS: ORAL_TABLET | ORAL | Status: DC

## 2016-02-25 MED ORDER — CARVEDILOL 12.5 MG PO TABS: 12.5000 mg | ORAL_TABLET | Freq: Two times a day (BID) | ORAL | Status: DC

## 2016-02-25 MED ORDER — ALBUTEROL SULFATE HFA 108 (90 BASE) MCG/ACT IN AERS: 2.0000 | INHALATION_SPRAY | Freq: Four times a day (QID) | RESPIRATORY_TRACT | Status: DC | PRN

## 2016-02-25 MED ORDER — METFORMIN HCL 500 MG PO TABS: 500.0000 mg | ORAL_TABLET | Freq: Two times a day (BID) | ORAL | Status: DC

## 2016-02-25 MED ORDER — FAMOTIDINE 20 MG PO TABS: 20.0000 mg | ORAL_TABLET | Freq: Two times a day (BID) | ORAL | Status: DC

## 2016-02-25 MED ORDER — AMLODIPINE BESYLATE 5 MG PO TABS: 5.0000 mg | ORAL_TABLET | Freq: Every day | ORAL | Status: DC

## 2016-02-25 MED ORDER — ATORVASTATIN CALCIUM 40 MG PO TABS: 40.0000 mg | ORAL_TABLET | Freq: Every day | ORAL | Status: DC

## 2016-02-25 MED ORDER — POTASSIUM CHLORIDE CRYS ER 20 MEQ PO TBCR
40.0000 meq | EXTENDED_RELEASE_TABLET | Freq: Once | ORAL | Status: AC
  Administered 2016-02-25: 40 meq via ORAL
  Filled 2016-02-25: qty 2

## 2016-02-25 NOTE — Progress Notes (Signed)
Received referral to assist pt with CPAP. Met with pt at bedside. She stated that the pulmonologist informed her that she needs to make an appointment with her doctor, he can refer her to him, she can have a sleep study at [facility] and she can get the CPAP. Encouraged pt to make the appointment this week. She verbalized understanding.

## 2016-02-25 NOTE — Progress Notes (Signed)
Pt discharge education and instructions completed with pt at bedside; pt voices understanding and denies any questions. Pt IV and telemetry removed; pt discharge home with family to transport her home. Pt handed her prescriptions for albuterol, Norvasc, Lipitor; coreg, pepcid; Lantus; metformin and prednisone. Pt transported off unit via wheelchair with belongings to the side. Dionne Bucy RN

## 2016-02-25 NOTE — Discharge Summary (Signed)
PATIENT DETAILS Name: [name] Age: 50 y.o. Sex: female Date of Birth: 1966-03-24 MRN: [id number]. Admitting Physician: Collene Gobble, MD DSK:[id number], Mary Sella, MD  Admit Date: 02/19/2016 Discharge date: 02/25/2016  Recommendations for Outpatient Follow-up:  1. Ensure follow up with cardiology 2. Avoid ACE for now-per PCCM avoid indefinitely 3. Please repeat CBC/BMET at next visit  PRIMARY DISCHARGE DIAGNOSIS:  Active Problems:   Stridor   Encounter for orogastric tube placement   CAD, multiple vessel   Mitral valve mass   Acute respiratory failure with hypoxia (HCC)   OSA (obstructive sleep apnea)      PAST MEDICAL HISTORY: Past Medical History  Diagnosis Date  . Diabetes mellitus   . Hypertension     Does not see a cardiologist  . Sleep apnea     Uses CPAP  . Flash pulmonary edema ([place]) 02/10/2016    requiring intubation  . CAD (coronary artery disease) 02/10/2016    a. NSTEMI 02/2016: DES to mid-LAD, POBA to 1st Diag, 85% stenosis of RCA noted with staged PCI recommended  . ARF (acute respiratory failure) ([place]) 02/10/2016  . Chronic combined systolic (congestive) and diastolic (congestive) heart failure ([place])     a. Echo 02/2016: EF 40-45% w/ Grade 2 DD. 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    DISCHARGE MEDICATIONS: Current Discharge Medication List    START taking these medications   Details  albuterol (PROVENTIL HFA;VENTOLIN HFA) 108 (90 Base) MCG/ACT inhaler Inhale 2 puffs into the lungs every 6 (six) hours as needed for wheezing or shortness of breath. Qty: 1 Inhaler, Refills: 0    amLODipine (NORVASC) 5 MG tablet Take 1 tablet (5 mg total) by mouth daily. Qty: 30 tablet, Refills: 0    famotidine (PEPCID) 20 MG tablet Take 1 tablet (20 mg total) by mouth 2 (two) times daily. Qty: 60 tablet, Refills: 0    predniSONE (DELTASONE) 10 MG tablet Take 4 tablets (40 mg) daily for 2 days, then, Take 3 tablets (30 mg) daily for 2  days, then, Take 2 tablets (20 mg) daily for 2 days, then, Take 1 tablets (10 mg) daily for 1 days, then stop Qty: 19 tablet, Refills: 0      CONTINUE these medications which have CHANGED   Details  atorvastatin (LIPITOR) 40 MG tablet Take 1 tablet (40 mg total) by mouth daily at 6 PM. Qty: 90 tablet, Refills: 0    carvedilol (COREG) 12.5 MG tablet Take 1 tablet (12.5 mg total) by mouth 2 (two) times daily with a meal. Qty: 60 tablet, Refills: 0    insulin glargine (LANTUS) 100 UNIT/ML injection Inject 0.2 mLs (20 Units total) into the skin at bedtime. Qty: 10 mL, Refills: 0    metFORMIN (GLUCOPHAGE) 500 MG tablet Take 1 tablet (500 mg total) by mouth 2 (two) times daily with a meal. Qty: 60 tablet, Refills: 0      CONTINUE these medications which have NOT CHANGED   Details  acetaminophen (TYLENOL) 325 MG tablet Take 2 tablets (650 mg total) by mouth every 6 (six) hours as needed for mild pain (or Fever >/= 101). Qty: 60 tablet, Refills: 0    blood glucose meter kit and supplies KIT Dispense based on patient and insurance preference. Use up to four times daily as directed. (FOR ICD-9 250.00, 250.01). Qty: 1 each, Refills: 0    ticagrelor (BRILINTA) 90 MG TABS tablet Take 1 tablet (90 mg total) by mouth 2 (two)  times daily. Qty: 60 tablet, Refills: 0    aspirin EC 81 MG EC tablet Take 1 tablet (81 mg total) by mouth daily. Qty: 30 tablet, Refills: 0      STOP taking these medications     lisinopril (PRINIVIL,ZESTRIL) 20 MG tablet         ALLERGIES:   Allergies  Allergen Reactions  . Aceite De Ajonjoli [Sesame Oil] Swelling    BRIEF HPI:  See H&P, Labs, Consult and Test reports for all details in brief, 50 yo female with recent MI an LAD stent. With recent admission for Flash pulm edema, intubated, also had NSTEMI with PCI. Pt presented with worsening stridor and difficulty breathing.   CONSULTATIONS:   cardiology and pulmonary/intensive care  PERTINENT  RADIOLOGIC STUDIES: Dg Chest 2 View  02/16/2016  CLINICAL DATA:  Cough and congestion for 1 week. EXAM: CHEST  2 VIEW COMPARISON:  02/13/2016 chest radiograph. FINDINGS: Partially visualized surgical hardware from ACDF overlying the lower cervical spine. Stable cardiomediastinal silhouette with mild cardiomegaly. No pneumothorax. No pleural effusion. Lungs appear clear, with no acute consolidative airspace disease and no pulmonary edema. IMPRESSION: Stable mild cardiomegaly without pulmonary edema. No active pulmonary disease. Electronically Signed   By: Ilona Sorrel M.D.   On: 02/16/2016 13:09   Ct Soft Tissue Neck W Contrast  02/19/2016  CLINICAL DATA:  Patient developed stridor after recent intubation. Some improvement after recent breathing treatments. EXAM: CT NECK WITH CONTRAST TECHNIQUE: Multidetector CT imaging of the neck was performed using the standard protocol following the bolus administration of intravenous contrast. CONTRAST:  68m ISOVUE-300 IOPAMIDOL (ISOVUE-300) INJECTION 61% COMPARISON:  Chest radiograph early today. FINDINGS: Pharynx and larynx: Mild edema of the larynx including the arytenoids, false, and true cords. No pharyngeal lesion of significance. Parapharyngeal fat preserved. Diffuse subglottic tracheal edema extending over 3 cm segment, beginning 3 cm below the true cords, with circumferential wall narrowing, and fibrinous type webs. Estimated luminal narrowing could be up to 50%. Salivary glands: Negative Thyroid: Negative Lymph nodes: Negative Vascular: Negative Limited intracranial: Negative Visualized orbits: Negative Mastoids and visualized paranasal sinuses: Neck pain Skeleton: Status post C4-5 and C6-7 ACDF. These appear unremarkable. Upper chest: Not included. IMPRESSION: Subglottic tracheal edema extending over a 3 cm segment, beginning 3 cm below the true cords, with luminal narrowing up to 50%. This is likely related to recent intubation. No intrinsic lesion of the  larynx, but mild edema of the arytenoids as well as the true and false cords is observed. No pharyngeal or epiglottic abnormalities are detected. Electronically Signed   By: JStaci RighterM.D.   On: 02/19/2016 15:10   Dg Chest Port 1 View  02/23/2016  CLINICAL DATA:  Acute respiratory failure, stridor, Coronary artery disease, diabetes. EXAM: PORTABLE CHEST 1 VIEW COMPARISON:  Portable chest x-ray of February 22, 2016 FINDINGS: The lungs are reasonably well inflated. Basilar densities have improved. The cardiac silhouette remains enlarged. The pulmonary vascularity is mildly prominent centrally. There is no significant pleural effusion. The endotracheal tube tip projects approximately 4.9 cm above the carina. The esophagogastric tube tip projects below the inferior margin of the image. IMPRESSION: Improved bibasilar atelectasis or pneumonia. Stable cardiomegaly with mild central pulmonary vascular congestion. The support tubes are in stable position. Electronically Signed   By: David  JMartiniqueM.D.   On: 02/23/2016 07:08   Dg Chest Port 1 View  02/22/2016  CLINICAL DATA:  Stridor. EXAM: PORTABLE CHEST 1 VIEW COMPARISON:  February 21, 2016. FINDINGS: Stable  cardiomegaly. Endotracheal and nasogastric tubes are unchanged in position. No pneumothorax is noted. Mildly increased bibasilar opacities are noted most consistent with subsegmental atelectasis. No pleural effusion is noted. Bony thorax is unremarkable. IMPRESSION: Mildly increased bibasilar subsegmental atelectasis. Stable support apparatus. Electronically Signed   By: Marijo Conception, M.D.   On: 02/22/2016 07:11   Dg Chest Port 1 View  02/21/2016  CLINICAL DATA:  Stridor, intubated patient, coronary artery disease, CHF, acute renal failure. EXAM: PORTABLE CHEST 1 VIEW COMPARISON:  Portable chest x-ray of February 19, 2016 FINDINGS: The lungs are well-expanded. Improvement it new in the patchy interstitial densities at the lung bases has occurred. There is no  pleural effusion or pneumothorax. The cardiac silhouette remains enlarged. The pulmonary vascularity is less engorged. The endotracheal tube tip lies 5.5 cm above the carina. The esophagogastric tube projects below the inferior margin of the image. IMPRESSION: Some interval improvement in subsegmental atelectasis or interstitial pneumonia in both lungs. Stable cardiomegaly with mild central pulmonary vascular congestion. The support tubes are in reasonable position. Electronically Signed   By: David  Martinique M.D.   On: 02/21/2016 07:22   Dg Chest Port 1 View  02/20/2016  CLINICAL DATA:  Intubation. EXAM: PORTABLE CHEST 1 VIEW COMPARISON:  Earlier this day at 2116 hour FINDINGS: Endotracheal tube 2.1 cm from the carina. Enteric tube in place, tip below the diaphragm not included in the field of view. Cardiomediastinal contours are unchanged. Left greater than right airspace opacities primarily in the perihilar regions, with minimal interval improvement. No large pleural effusion or pneumothorax. Low lung volumes persist. IMPRESSION: 1. Endotracheal tube 2.1 cm from the carina.  Enteric tube in place. 2. Low lung volumes with bilateral airspace opacities, mild improvement from prior exam. Electronically Signed   By: Jeb Levering M.D.   On: 02/20/2016 00:03   Dg Chest Port 1 View  02/19/2016  CLINICAL DATA:  Patient status post ET tube placement. EXAM: PORTABLE CHEST 1 VIEW COMPARISON:  Chest radiograph 02/19/2016. FINDINGS: ET tube terminates within the proximal trachea. Enteric tube courses inferior to the diaphragm. Stable cardiomegaly. Interval development of diffuse consolidation throughout the left lung with patchy areas of consolidation within the right lung. No definite pleural effusion or pneumothorax. IMPRESSION: ET tube terminates in the proximal trachea. Enteric tube courses inferior to the diaphragm. Interval development of scattered consolidation throughout the left lung and patchy opacities  within the right mid lung. Findings are nonspecific and may represent aspiration, asymmetric edema, atelectasis or infection. Electronically Signed   By: Lovey Newcomer M.D.   On: 02/19/2016 21:47   Dg Chest Port 1 View  02/19/2016  CLINICAL DATA:  Shortness of breath.  Wheezing.  Ex-smoker. EXAM: PORTABLE CHEST 1 VIEW COMPARISON:  02/16/2016. FINDINGS: Stable enlarged cardiac silhouette. Clear lungs. Thoracic spine degenerative changes. IMPRESSION: No acute abnormality.  Stable cardiomegaly. Electronically Signed   By: Claudie Revering M.D.   On: 02/19/2016 11:44   Dg Chest Port 1 View  02/13/2016  CLINICAL DATA:  Pulmonary edema. EXAM: PORTABLE CHEST 1 VIEW COMPARISON:  02/2016. FINDINGS: Endotracheal tube and NG tube in stable position. Cardiomegaly with pulmonary infiltrates with pulmonary edema. Interim partial clearing from prior exam. No significant pleural effusion. No pneumothorax. IMPRESSION: 1. Lines and tubes in stable position. 2. Cardiomegaly with bilateral pulmonary infiltrates consistent with pulmonary edema. Partial clearing from prior exam. Electronically Signed   By: Marcello Moores  Register   On: 02/13/2016 07:02   Dg Chest Portable 1 View  02/10/2016  CLINICAL DATA:  Respiratory failure and status post intubation. EXAM: PORTABLE CHEST 1 VIEW COMPARISON:  11/16/2013 FINDINGS: Endotracheal tube present with the tip approximately 3 cm above the carina. Lungs show diffuse pulmonary edema bilaterally. There may be additional component of right lung pneumonia. No pneumothorax. No significant pleural fluid identified. The heart is mildly enlarged. IMPRESSION: Endotracheal tube tip approximately 3 cm above the carina. Diffuse pulmonary edema present. Potential additional component of right lung pneumonia. Electronically Signed   By: Aletta Edouard M.D.   On: 02/10/2016 19:02   Dg Abd Portable 1v  02/19/2016  CLINICAL DATA:  OG tube placement. EXAM: PORTABLE ABDOMEN - 1 VIEW COMPARISON:  Concurrently  performed chest radiograph. FINDINGS: Tip and side port of the enteric tube below the diaphragm in the stomach. No dilated bowel loops to suggest obstruction. Small volume of colonic stool. IMPRESSION: Tip and side port of the enteric tube below the diaphragm in the stomach. Electronically Signed   By: Jeb Levering M.D.   On: 02/19/2016 21:50     PERTINENT LAB RESULTS: CBC:  Recent Labs  02/23/16 0213  WBC 13.7*  HGB 12.0  HCT 41.1  PLT 200   CMET CMP     Component Value Date/Time   NA 132* 02/25/2016 0756   K 3.8 02/25/2016 0756   CL 95* 02/25/2016 0756   CO2 24 02/25/2016 0756   GLUCOSE 151* 02/25/2016 0756   BUN 35* 02/25/2016 0756   CREATININE 0.93 02/25/2016 0756   CREATININE 0.96 12/18/2013 1451   CALCIUM 9.7 02/25/2016 0756   PROT 7.3 02/19/2016 1615   ALBUMIN 3.5 02/19/2016 1615   AST 29 02/19/2016 1615   ALT 27 02/19/2016 1615   ALKPHOS 73 02/19/2016 1615   BILITOT 1.1 02/19/2016 1615   GFRNONAA >60 02/25/2016 0756   GFRAA >60 02/25/2016 0756    GFR Estimated Creatinine Clearance: 82.3 mL/min (by C-G formula based on Cr of 0.93). No results for input(s): LIPASE, AMYLASE in the last 72 hours. No results for input(s): CKTOTAL, CKMB, CKMBINDEX, TROPONINI in the last 72 hours. Invalid input(s): POCBNP No results for input(s): DDIMER in the last 72 hours. No results for input(s): HGBA1C in the last 72 hours. No results for input(s): CHOL, HDL, LDLCALC, TRIG, CHOLHDL, LDLDIRECT in the last 72 hours. No results for input(s): TSH, T4TOTAL, T3FREE, THYROIDAB in the last 72 hours.  Invalid input(s): FREET3 No results for input(s): VITAMINB12, FOLATE, FERRITIN, TIBC, IRON, RETICCTPCT in the last 72 hours. Coags: No results for input(s): INR in the last 72 hours.  Invalid input(s): PT Microbiology: Recent Results (from the past 240 hour(s))  Culture, blood (routine x 2)     Status: None   Collection Time: 02/19/16  4:10 PM  Result Value Ref Range Status    Specimen Description BLOOD LEFT WRIST  Final   Special Requests BOTTLES DRAWN AEROBIC ONLY 5CC  Final   Culture NO GROWTH 5 DAYS  Final   Report Status 02/24/2016 FINAL  Final  Culture, blood (routine x 2)     Status: None   Collection Time: 02/19/16  4:15 PM  Result Value Ref Range Status   Specimen Description BLOOD LEFT HAND  Final   Special Requests BOTTLES DRAWN AEROBIC ONLY 5CC  Final   Culture NO GROWTH 5 DAYS  Final   Report Status 02/24/2016 FINAL  Final  Urine culture     Status: None   Collection Time: 02/19/16  5:24 PM  Result Value Ref Range Status  Specimen Description URINE, RANDOM  Final   Special Requests NONE  Final   Culture MULTIPLE SPECIES PRESENT, SUGGEST RECOLLECTION  Final   Report Status 02/21/2016 FINAL  Final     BRIEF HOSPITAL COURSE:  Acute respiratory acidosis with resp distress: due to subglottic edema causing stenosis. ENT performed Flexible fiberoptic laryngoscopy which showed subglottic edema, possibly early stenosis, likely related to recent intubation during her prior hospitalization and possible ACEI use.  Intubated on admission, s/p extubation on 4/20.Transferred to [facility] service on 4/22. PCCM recommends prednisone taper over 1 week, and to avoid ACEI permanently. Currently no symptoms-ambulating in the hallway-on room air. Lungs clear on auscultation. No further recommendations from PCCM. Stable for discharge.   Recent NSTEMI s/p recent CXK:GYJEHUDJ ASA and brilinta. Follow-up scheduled with Dr Fletcher Anon 5/2 to discuss timing of staged PCI. Cardiology consulted during this hospital stay  SHF:WYOVZCHYIF with Amlodipine and Coreg. ACEI on hold due to resp issues.   DM 2:continue Lantus and metformin on discharge  Dyslipidemia:continue Statin  Untreated OSA with no cpap x 3 years-will need outpatient CPAP titration study. Have asked RN to consult CM for CPAP. Will need outpatient PCCM follow up.  TODAY-DAY OF DISCHARGE:  Subjective:   Aaralynn Carver  today has no headache,no chest abdominal pain,no new weakness tingling or numbness, feels much better wants to go home today.   Objective:   Blood pressure 132/78, pulse 67, temperature 98.8 F (37.1 C), temperature source Oral, resp. rate 18, height 5' 2"  (1.575 m), weight 101.016 kg (222 lb 11.2 oz), last menstrual period 01/19/2016, SpO2 98 %.  Intake/Output Summary (Last 24 hours) at 02/25/16 1351 Last data filed at 02/25/16 0900  Gross per 24 hour  Intake    120 ml  Output    500 ml  Net   -380 ml   Filed Weights   02/23/16 0400 02/24/16 0338 02/25/16 0411  Weight: 100.8 kg (222 lb 3.6 oz) 105 kg (231 lb 7.7 oz) 101.016 kg (222 lb 11.2 oz)    Exam Awake Alert, Oriented *3, No new F.N deficits, Normal affect [place].AT,PERRAL Supple Neck,No JVD, No cervical lymphadenopathy appriciated.  Symmetrical Chest wall movement, Good air movement bilaterally, CTAB RRR,No Gallops,Rubs or new Murmurs, No Parasternal Heave +ve B.Sounds, Abd Soft, Non tender, No organomegaly appriciated, No rebound -guarding or rigidity. No Cyanosis, Clubbing or edema, No new Rash or bruise  DISCHARGE CONDITION: Stable  DISPOSITION: Home  DISCHARGE INSTRUCTIONS:    Activity:  As tolerated   Get Medicines reviewed and adjusted: Please take all your medications with you for your next visit with your Primary MD  Please request your Primary MD to go over all hospital tests and procedure/radiological results at the follow up, please ask your Primary MD to get all Hospital records sent to his/her office.  If you experience worsening of your admission symptoms, develop shortness of breath, life threatening emergency, suicidal or homicidal thoughts you must seek medical attention immediately by calling 911 or calling your MD immediately  if symptoms less severe.  You must read complete instructions/literature along with all the possible adverse reactions/side effects for all the Medicines you take and that have  been prescribed to you. Take any new Medicines after you have completely understood and accpet all the possible adverse reactions/side effects.   Do not drive when taking Pain medications.   Do not take more than prescribed Pain, Sleep and Anxiety Medications  Special Instructions: If you have smoked or chewed Tobacco  in the  last 2 yrs please stop smoking, stop any regular Alcohol  and or any Recreational drug use.  Wear Seat belts while driving.  Please note  You were cared for by a hospitalist during your hospital stay. Once you are discharged, your primary care physician will handle any further medical issues. Please note that NO REFILLS for any discharge medications will be authorized once you are discharged, as it is imperative that you return to your primary care physician (or establish a relationship with a primary care physician if you do not have one) for your aftercare needs so that they can reassess your need for medications and monitor your lab values.   Diet recommendation: Diabetic Diet Heart Healthy diet  Discharge Instructions    Call MD for:  difficulty breathing, headache or visual disturbances    Complete by:  As directed      Call MD for:  persistant nausea and vomiting    Complete by:  As directed      Diet - low sodium heart healthy    Complete by:  As directed      Diet Carb Modified    Complete by:  As directed      Increase activity slowly    Complete by:  As directed            Follow-up Information    Follow up with Lupita Dawn, MD On 03/01/2016.   Specialty:  Family Medicine   Why:  Hospital follow up-appt at 9:30 am   Contact information:   [place] [address] [phone]       Follow up with [name], MD On 03/06/2016.   Specialty:  Cardiology   Why:  Hospital follow up-appt at 8:45 am   Contact information:   [address] [place] [address] [phone]       Follow up with Christinia Gully, MD. Schedule an appointment as soon as possible for a visit in 2 weeks.   Specialty:  Pulmonary Disease   Why:  Hospital follow up   Contact information:   [address] [address] [phone]      Total Time spent on discharge equals 45 minutes.  SignedOren Binet 02/25/2016 1:51 PM

## 2016-02-25 NOTE — Progress Notes (Signed)
Pt is sleeping comfortably at this time. No distress noted.  

## 2016-02-25 NOTE — Progress Notes (Signed)
PULMONARY / CRITICAL CARE MEDICINE   Name: [name] MRN: [id number] DOB: 28-Feb-1966    ADMISSION DATE:  02/19/2016  Consult Date- 02/19/2016  REFERRING MD:EDP  CHIEF COMPLAINT:  STRIDOR on ACEi on admit   HISTORY OF PRESENT ILLNESS:   50 yo obese female just discharged  4/14 after being intubated for MI from LAD/diagonal blockage required stent and balloon angioplasty. She was extubated and on day of discharge she noted noisy breathing (documented in cards note) but was discharged to home. She reports her "noisy breathing became unbearable and she presents to ER\ with stridor. ENT Scoped her without obvious injury. Please note she has documented OSA with broken cpap machine x 3 years.    SUBJECTIVE:  No sob/ cough/ hoarseness   VITAL SIGNS: BP 132/78 mmHg  Pulse 67  Temp(Src) 98.8 F (37.1 C) (Oral)  Resp 18  Ht 5\' 2"  (1.575 m)  Wt 222 lb 11.2 oz (101.016 kg)  BMI 40.72 kg/m2  SpO2 98%  LMP 01/19/2016  RA  HEMODYNAMICS:   VENTILATOR SETTINGS:    INTAKE / OUTPUT: I/O last 3 completed shifts: In: 480 [P.O.:480] Out: 950 [Urine:950]  PHYSICAL EXAMINATION: General:   Comfortable supine  Neuro: alert/ approp HEENT: no stridor Cardiovascular: Regular, no murmurs Lungs:  Clear to A and P  Abdomen:  obese, not tender, bs present Musculoskeletal:  Intact, no pedal edema. Skin:  warm  LABS:  BMET  Recent Labs Lab 02/22/16 0233 02/23/16 0213 02/25/16 0756  NA 140 138 132*  K 4.5 4.3 3.8  CL 102 99* 95*  CO2 28 29 24   BUN 31* 29* 35*  CREATININE 0.74 0.75 0.93  GLUCOSE 230* 208* 151*    Electrolytes  Recent Labs Lab 02/19/16 1615  02/22/16 0233 02/23/16 0213 02/25/16 0756  CALCIUM 9.8  < > 10.2 10.2 9.7  MG 2.3  --  2.2 2.3 2.1  PHOS 3.9  --  3.0 3.7  --   < > = values in this interval not displayed.  CBC  Recent Labs Lab 02/21/16 0207 02/22/16 0233 02/23/16 0213  WBC 20.7* 14.4* 13.7*  HGB 11.4* 12.1 12.0  HCT 38.6 40.9 41.1  PLT 204  187 200    Coag's  Recent Labs Lab 02/19/16 1615  APTT 27  INR 1.22    Sepsis Markers  Recent Labs Lab 02/19/16 1615  LATICACIDVEN 1.9  PROCALCITON <0.10    ABG  Recent Labs Lab 02/19/16 2207 02/20/16 0244  PHART 7.326* 7.433  PCO2ART 58.7* 41.7  PO2ART 243.0* 114.0*    Liver Enzymes  Recent Labs Lab 02/19/16 1615  AST 29  ALT 27  ALKPHOS 73  BILITOT 1.1  ALBUMIN 3.5    Cardiac Enzymes  Recent Labs Lab 02/19/16 1126 02/20/16 1102  TROPONINI 0.86* 0.69*    Glucose  Recent Labs Lab 02/24/16 1621 02/24/16 1943 02/24/16 2346 02/25/16 0409 02/25/16 0816 02/25/16 1128  GLUCAP 210* 180* 179* 183* 168* 216*   Imaging No results found. STUDIES:  4/16 ct neck > subglottic tracheal edema 2/2 recent intubation 4/16 ENT larynx exam - ROSEN  CULTURES: 4/16 bc>> NGTD 4/16 Urine cultures>> NG - Final  ANTIBIOTICS: None  SIGNIFICANT EVENTS: 4/16 admitted for stridor, No improvement with Racemic epi and Steroids    LINES/TUBES: OGT- 4/16 >> ETT- 4/16 > ext 4/20   DISCUSSION: 49 yo female with recent MI an LAD stent. With recent admission for Flash pulm edema, intubated, also had NSTEMI with PCI. Pt presented with  worsening stridor and difficulty breathing. Flexible laryhgoscopy in the ED by ENT- unremarkable. CT head and neck- subglottic tracheal edema. Evaluated by ENT- for possible Trach and Cardiology- for Possible TEE for mobile mass seen on last admission on Echo.  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

## 2016-03-01 ENCOUNTER — Encounter: Payer: Self-pay | Admitting: Family Medicine

## 2016-03-01 ENCOUNTER — Encounter: Payer: Self-pay | Admitting: *Deleted

## 2016-03-01 ENCOUNTER — Ambulatory Visit (INDEPENDENT_AMBULATORY_CARE_PROVIDER_SITE_OTHER): Payer: BLUE CROSS/BLUE SHIELD | Admitting: Family Medicine

## 2016-03-01 VITALS — BP 120/70 | HR 84 | Temp 98.3°F | Ht 62.0 in | Wt 217.0 lb

## 2016-03-01 DIAGNOSIS — J9601 Acute respiratory failure with hypoxia: Secondary | ICD-10-CM

## 2016-03-01 DIAGNOSIS — I10 Essential (primary) hypertension: Secondary | ICD-10-CM | POA: Diagnosis not present

## 2016-03-01 DIAGNOSIS — W19XXXA Unspecified fall, initial encounter: Secondary | ICD-10-CM

## 2016-03-01 DIAGNOSIS — I059 Rheumatic mitral valve disease, unspecified: Secondary | ICD-10-CM | POA: Diagnosis not present

## 2016-03-01 DIAGNOSIS — K219 Gastro-esophageal reflux disease without esophagitis: Secondary | ICD-10-CM

## 2016-03-01 DIAGNOSIS — I058 Other rheumatic mitral valve diseases: Secondary | ICD-10-CM

## 2016-03-01 DIAGNOSIS — I214 Non-ST elevation (NSTEMI) myocardial infarction: Secondary | ICD-10-CM

## 2016-03-01 HISTORY — DX: Unspecified fall, initial encounter: W19.XXXA

## 2016-03-01 LAB — COMPLETE METABOLIC PANEL WITH GFR
ALBUMIN: 4 g/dL (ref 3.6–5.1)
ALK PHOS: 114 U/L (ref 33–115)
ALT: 39 U/L — ABNORMAL HIGH (ref 6–29)
AST: 23 U/L (ref 10–35)
BUN: 27 mg/dL — ABNORMAL HIGH (ref 7–25)
CHLORIDE: 90 mmol/L — AB (ref 98–110)
CO2: 29 mmol/L (ref 20–31)
Calcium: 9.9 mg/dL (ref 8.6–10.2)
Creat: 1.09 mg/dL (ref 0.50–1.10)
GFR, EST NON AFRICAN AMERICAN: 60 mL/min (ref >=60)
GFR, Est African American: 69 mL/min (ref >=60)
GLUCOSE: 255 mg/dL — AB (ref 65–99)
POTASSIUM: 3.8 mmol/L (ref 3.5–5.3)
SODIUM: 138 mmol/L (ref 135–146)
Total Bilirubin: 1 mg/dL (ref 0.2–1.2)
Total Protein: 6.5 g/dL (ref 6.1–8.1)

## 2016-03-01 LAB — CBC WITH DIFFERENTIAL/PLATELET
BASOS ABS: 0 {cells}/uL (ref 0–200)
Basophils Relative: 0 %
EOS PCT: 0 %
Eosinophils Absolute: 0 cells/uL — ABNORMAL LOW (ref 15–500)
HEMATOCRIT: 36.6 % (ref 35.0–45.0)
HEMOGLOBIN: 11.6 g/dL — AB (ref 11.7–15.5)
LYMPHS ABS: 1841 {cells}/uL (ref 850–3900)
Lymphocytes Relative: 7 %
MCH: 24.8 pg — AB (ref 27.0–33.0)
MCHC: 31.7 g/dL — ABNORMAL LOW (ref 32.0–36.0)
MCV: 78.2 fL — ABNORMAL LOW (ref 80.0–100.0)
MONO ABS: 789 {cells}/uL (ref 200–950)
MPV: 12 fL (ref 7.5–12.5)
Monocytes Relative: 3 %
NEUTROS ABS: 23670 {cells}/uL — AB (ref 1500–7800)
Neutrophils Relative %: 90 %
Platelets: 287 10*3/uL (ref 140–400)
RBC: 4.68 MIL/uL (ref 3.80–5.10)
RDW: 18.6 % — ABNORMAL HIGH (ref 11.0–15.0)
WBC: 26.3 10*3/uL — ABNORMAL HIGH (ref 3.8–10.8)

## 2016-03-01 MED ORDER — OMEPRAZOLE 40 MG PO CPDR: 40.0000 mg | DELAYED_RELEASE_CAPSULE | Freq: Every day | ORAL | Status: DC

## 2016-03-01 NOTE — Patient Instructions (Signed)
It was nice to meet you today.  Blood pressure - well controlled, continue Amlodipine and Coreg, DO NOT start Lisinopril  Sleep Apnea - referral to Pulmonology for a sleep study  Acid reflux - change Famotidine to Omeprazole  Heart issues - keep appointment with Cardiology  Fall - referal to physical physical therapy, (you may call to schedule).

## 2016-03-01 NOTE — Progress Notes (Signed)
Subjective:    Patient ID: [name], female    DOB: 1966/04/15, 50 y.o.   MRN: [id number]  HPI 50 y/o female presents for hospital follow up.   Admitted 4/7-4/14 to [facility]. Reviewed Discharge Summary. Treated for Acute Respiratory Failure/NSTEMI during his admission. Did require intubation due to flash pulmonary edema. Echo on 02/11/16 showed EF 55-60% and Grade 2 Diastolic Dysfunction; TTE on 4/10 showed reduced EF 40-45%, grade 2 diastolic dysfunction and possible MV mass/lesion; Cardiac cath completed with DES placed in mid LAD and balloon angioplasty of first diagonal. Duel antiplatelet therapy for one year. Staged R PCI in 3-4 weeks. Patient unable to tolerate TEE.  Readmitted 4/16-4/22 to [facility]. Reviewed Discharge Summary. Treated for Acute Respiratory Failure/Stridor 2/2 recent intubation vs ACE-I. Re-intubated from 4/16-4/20. ACE-I also held. Started on Prednisone and currently on taper.   Acute Respiratory Failure - patient reports no sob at rest, some fatigue with ambulation, no cough, does report some lightheadedness today, no loc, no syncope  GERD - fomotadine not helping, continues to have symtpoms  HTN/CAD - taking Amlodipine 5 mg daily and Coreg 12.5 mg BID, no chest pain, no vision changes, no headaches, walking 30 minutes per day (some fatigue but no associated chest pain). Does feel some lightheadedness today, no dizzineaa, no nausea, tolerating diet. Has scheduled cardiology follow up on 03/06/16. TEE on 4/19 showed no mass, EF 55-60%  Fall yesterday - fell forward after getting up, mechanical, no syncope, no chest pain, no sob at that time, uses a walker when outside  Poor Sleep/OSA - has trouble falling asleep, does have some anxiety related to medical, not nearly as active, requests referral to Dr. Sherene Sires (told needs sleep study and that Dr. Sherene Sires could arrange)  Glucose this AM 203, took Metformin with breakfast   Social -  non-smoker   Review of Systems  Constitutional: Negative for fever, chills and fatigue.  Respiratory: Negative for cough, choking and shortness of breath.   Cardiovascular: Negative for chest pain.  Gastrointestinal: Negative for nausea, vomiting and diarrhea.       Objective:   Physical Exam BP 120/70 mmHg  Pulse 84  Temp(Src) 98.3 F (36.8 C) (Oral)  Ht 5\' 2"  (1.575 m)  Wt 217 lb (98.431 kg)  BMI 39.68 kg/m2  SpO2 98%  LMP 01/19/2016   Gen: pleasant female, accompanied by daughter, obese HEENT: normocephalic, PERRL, EOMI, no scleral icterus, MMM Cardiac: RRR, S1 and S2 present, no murmur, no heaves/thrills Resp: CTAB, normal effort Ext: trace edema  Reviewed most recent labs on 4/22. Na low at 132; CBC on 4/20 showed elevated WBC (13.7)      Assessment & Plan:  Acute respiratory failure with hypoxia [facility]) Hospital follow up for Acute Respiratory Failure. Also had subglottic edema s/p intubation. Patient doing well. -complete prednisone taper -referral to Pulmonology for Sleep Study to evaluate for OSA  Hypertension Blood pressure controlled on Amlodipine 5 mg and Coreg 12.5 mg. 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   NSTEMI (non-ST elevated myocardial infarction) [facility]) Hospital follow up for NSTEMI. DES placed to mid LAD.  -continue ASA + Brilinta. Also on Coreg/Lipitor.   Fall Mechanical fall at home. Patient weak following multiple recent hospital admission. -referral placed from home health PT.   GERD (gastroesophageal reflux disease) Famotidine not controlling symptoms. -change to Omeprazole 40 mg daily.

## 2016-03-02 ENCOUNTER — Telehealth: Payer: Self-pay | Admitting: Family Medicine

## 2016-03-02 ENCOUNTER — Telehealth: Payer: Self-pay | Admitting: Pulmonary Disease

## 2016-03-02 NOTE — Assessment & Plan Note (Signed)
Famotidine not controlling symptoms. -change to Omeprazole 40 mg daily.

## 2016-03-02 NOTE — Assessment & Plan Note (Signed)
Echo on 4/10 showed possible MV mass however TEE on 4/19 negative for mass.

## 2016-03-02 NOTE — Assessment & Plan Note (Signed)
Blood pressure controlled on Amlodipine 5 mg and Coreg 12.5 mg. -check BMP today

## 2016-03-02 NOTE — Assessment & Plan Note (Signed)
Hospital follow up for NSTEMI. DES placed to mid LAD.  -continue ASA + Brilinta. Also on Coreg/Lipitor.

## 2016-03-02 NOTE — Telephone Encounter (Signed)
Attempted to contact pt. No answer, no option to leave a message. Will try back.  

## 2016-03-02 NOTE — Assessment & Plan Note (Signed)
Mechanical fall at home. Patient weak following multiple recent hospital admission. -referral placed from home health PT.

## 2016-03-02 NOTE — Assessment & Plan Note (Signed)
Hospital follow up for Acute Respiratory Failure. Also had subglottic edema s/p intubation. Patient doing well. -complete prednisone taper -referral to Pulmonology for Sleep Study to evaluate for OSA

## 2016-03-02 NOTE — Telephone Encounter (Signed)
Discussed CMP and CBC results with patient. Glucose elevated likely due to eating before labs, WBC also elevated likely due to steroids, will need to recheck at next appointment

## 2016-03-05 NOTE — Telephone Encounter (Signed)
atc pt, no answer, no vm.  wcb.  

## 2016-03-06 ENCOUNTER — Telehealth: Payer: Self-pay | Admitting: Family Medicine

## 2016-03-06 ENCOUNTER — Telehealth: Payer: Self-pay | Admitting: *Deleted

## 2016-03-06 ENCOUNTER — Encounter: Payer: Self-pay | Admitting: Cardiovascular Disease

## 2016-03-06 ENCOUNTER — Ambulatory Visit (INDEPENDENT_AMBULATORY_CARE_PROVIDER_SITE_OTHER): Payer: BLUE CROSS/BLUE SHIELD | Admitting: Cardiovascular Disease

## 2016-03-06 VITALS — BP 138/64 | HR 75 | Ht 62.5 in | Wt 222.2 lb

## 2016-03-06 DIAGNOSIS — I2510 Atherosclerotic heart disease of native coronary artery without angina pectoris: Secondary | ICD-10-CM

## 2016-03-06 DIAGNOSIS — R0602 Shortness of breath: Secondary | ICD-10-CM

## 2016-03-06 DIAGNOSIS — K5900 Constipation, unspecified: Secondary | ICD-10-CM

## 2016-03-06 LAB — PROTIME-INR
INR: 1.04 (ref <1.50)
Prothrombin Time: 13.7 seconds (ref 11.6–15.2)

## 2016-03-06 NOTE — Progress Notes (Signed)
Cardiology Office Note   Date:  03/07/2016   ID:  KAYDYNCE PAT, DOB Jan 21, 1966, MRN [id number]  PCP:  Lupita Dawn, MD  Cardiologist:   [name], MD   Chief Complaint  Patient presents with  . Hospitalization Follow-up    s/p pci - patient reports no cardiac complaints. she reports a fall when she got home from the hospital and complains leg pain.      History of Present Illness: [name] is a 50 y.o. female who presents for  A follow-up visit regarding coronary artery disease and chronic systolic heart failure. She has known history of hypertension, type 2 diabetes and obesity. She was hospitalized on April 14 at [facility] with respiratory failure due to acute systolic heart failure with non-ST elevation myocardial infarction. Peak troponin was 7. Echocardiogram showed an ejection fraction of 40-45% with apical and inferoapical akinesis. There was a questionable mass on the mitral valve which was ruled out by TEE. She underwent cardiac catheterization during her hospitalization which showed severe two-vessel coronary artery disease with 99% mid to distal LAD stenosis, 99% ostial first diagonal stenosis and 85% mid RCA stenosis. She underwent successful balloon angioplasty of the first diagonal and drug-eluting stent placement to the mid to distal LAD. She developed left bundle branch block when the left ventricle was engaged and this persisted throughout her hospitalization. She was subsequently discharged home but returned with respiratory distress and stridor which was found to be due to subglottic edema. She was reintubated. This was felt to be due to her recent intubation and possibly due to ACE inhibitor use. She was discharged home on prednisone. She was told about obstructive sleep apnea during her hospitalization and was treated with CPAP.  Since hospital discharge, she has been doing reasonably well with no chest pain. She continues to have exertional dyspnea with  moderate activities. She fell recently with some confusion in her left knee. She is still able to walk.  She works at [employer] and has been off work since her initial hospitalization.   Past Medical History  Diagnosis Date  . Diabetes mellitus   . Hypertension     Does not see a cardiologist  . Sleep apnea     Uses CPAP  . Flash pulmonary edema ([place]) 02/10/2016    requiring intubation  . CAD (coronary artery disease) 02/10/2016    a. NSTEMI 02/2016: DES to mid-LAD, POBA to 1st Diag, 85% stenosis of RCA noted with staged PCI recommended  . ARF (acute respiratory failure) ([place]) 02/10/2016  . Chronic combined systolic (congestive) and diastolic (congestive) heart failure ([place])     a. Echo 02/2016: EF 40-45% w/ Grade 2 DD. 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    Past Surgical History  Procedure Laterality Date  . Breast surgery      cyst right breast  . Anterior cervical decomp/discectomy fusion  08/28/2012    Procedure: ANTERIOR CERVICAL DECOMPRESSION/DISCECTOMY FUSION 2 LEVELS;  Surgeon: Otilio Connors, MD;  Location: [place] [facility];  Service: Neurosurgery;  Laterality: N/A;  Cervical four-five, cervical six-seven Anterior cervical decompression/diskectomy/fusion/LifeNet Bone/Trestle plate  . Cardiac catheterization N/A 02/15/2016    Procedure: Left Heart Cath and Coronary Angiography;  Surgeon: Wellington Hampshire, MD;  Location: [facility];  Service: Cardiovascular;  Laterality: N/A;  . Cardiac catheterization N/A 02/15/2016    Procedure: Coronary Stent Intervention;  Surgeon: Wellington Hampshire, MD;  Location: [place] [facility];  Service: Cardiovascular;  Laterality: N/A;  . Cardiac catheterization N/A 02/15/2016    Procedure: Coronary Balloon Angioplasty;  Surgeon: Wellington Hampshire, MD;  Location: [place] [facility];  Service: Cardiovascular;  Laterality: N/A;  . Coronary angioplasty with stent placement       Current Outpatient Prescriptions    Medication Sig Dispense Refill  . acetaminophen (TYLENOL) 325 MG tablet Take 2 tablets (650 mg total) by mouth every 6 (six) hours as needed for mild pain (or Fever >/= 101). 60 tablet 0  . albuterol (PROVENTIL HFA;VENTOLIN HFA) 108 (90 Base) MCG/ACT inhaler Inhale 2 puffs into the lungs every 6 (six) hours as needed for wheezing or shortness of breath. 1 Inhaler 0  . amLODipine (NORVASC) 5 MG tablet Take 1 tablet (5 mg total) by mouth daily. 30 tablet 0  . aspirin EC 81 MG EC tablet Take 1 tablet (81 mg total) by mouth daily. 30 tablet 0  . atorvastatin (LIPITOR) 40 MG tablet Take 1 tablet (40 mg total) by mouth daily at 6 PM. 90 tablet 0  . blood glucose meter kit and supplies KIT Dispense based on patient and insurance preference. Use up to four times daily as directed. (FOR ICD-9 250.00, 250.01). 1 each 0  . carvedilol (COREG) 12.5 MG tablet Take 1 tablet (12.5 mg total) by mouth 2 (two) times daily with a meal. 60 tablet 0  . insulin glargine (LANTUS) 100 UNIT/ML injection Inject 0.2 mLs (20 Units total) into the skin at bedtime. 10 mL 0  . metFORMIN (GLUCOPHAGE) 500 MG tablet Take 1 tablet (500 mg total) by mouth 2 (two) times daily with a meal. 60 tablet 0  . omeprazole (PRILOSEC) 40 MG capsule Take 1 capsule (40 mg total) by mouth daily. 30 capsule 3  . ticagrelor (BRILINTA) 90 MG TABS tablet Take 1 tablet (90 mg total) by mouth 2 (two) times daily. 60 tablet 0  . polyethylene glycol powder (GLYCOLAX/MIRALAX) powder Take 17 g by mouth 2 (two) times daily as needed. 3350 g 1  . senna-docusate (SENOKOT-S) 8.6-50 MG tablet Take 2 tablets by mouth daily. As needed for constipation. 60 tablet 1   No current facility-administered medications for this visit.    Allergies:   Ace inhibitors    Social History:  The patient  reports that she has never smoked. She has never used smokeless tobacco. She reports that she drinks alcohol. She reports that she uses illicit drugs (Marijuana).   Family  History:  The patient's family history includes Diabetes in her paternal grandmother; Heart disease in her paternal aunt; Heart disease (age of onset: 49) in her paternal grandmother; Heart disease (age of onset: 22) in her father; Hypertension in her paternal grandmother. There is no history of Cancer, Stroke, or Alcohol abuse.    ROS:  Please see the history of present illness.   Otherwise, review of systems are positive for none.   All other systems are reviewed and negative.    PHYSICAL EXAM: VS:  BP 138/64 mmHg  Pulse 75  Ht 5' 2.5" (1.588 m)  Wt 222 lb 3.2 oz (100.789 kg)  BMI 39.97 kg/m2  LMP 01/19/2016 , BMI Body mass index is 39.97 kg/(m^2). GEN: Well nourished, well developed, in no acute distress HEENT: normal Neck: no JVD, carotid bruits, or masses Cardiac: RRR; no murmurs, rubs, or gallops,no edema  Respiratory:  clear to auscultation bilaterally, normal work of breathing GI: soft, nontender, nondistended, + BS MS: no deformity or atrophy Skin: warm  and dry, no rash Neuro:  Strength and sensation are intact Psych: euthymic mood, full affect   EKG:  EKG is ordered today. The ekg ordered today demonstrates normal sinus rhythm, left ventricular hypertrophy with repolarization abnormalities.   Recent Labs: 02/10/2016: B Natriuretic Peptide 227.6* 02/25/2016: Magnesium 2.1 03/01/2016: ALT 39* 03/06/2016: BUN 17; Creat 0.87; Hemoglobin 10.9*; Platelets 192; Potassium 3.0*; Sodium 139    Lipid Panel    Component Value Date/Time   CHOL 169 11/17/2013 0935   TRIG 749* 02/10/2016 2355   HDL 48 11/17/2013 0935   CHOLHDL 3.5 11/17/2013 0935   VLDL 22 11/17/2013 0935   LDLCALC 99 11/17/2013 0935      Wt Readings from Last 3 Encounters:  03/06/16 222 lb 3.2 oz (100.789 kg)  03/01/16 217 lb (98.431 kg)  02/25/16 222 lb 11.2 oz (101.016 kg)       ASSESSMENT AND PLAN:  1.  Coronary artery disease involving native coronary arteries with other forms of angina: Patient  continues to have exertional dyspnea without chest pain consistent with class II angina in spite of to anti-angina medications. She is known to have residual severe stenosis in the midright coronary artery from her recent cardiac catheterization. I recommend proceeding with staged RCA PCI. I discussed risks and benefits with the patient. She is already on dual antiplatelet therapy. Plan arterial access is either via the left radial artery or the right common femoral artery given that catheterization was not possible via the right radial artery.  2. Chronic systolic heart failure: Ejection fraction was 40-45%. She appears to be euvolemic without diuretics. Avoid ACE inhibitor due to possible angioedema.  3. Obstructive sleep apnea: The patient could not get appointment with pulmonary until the end of July. I think it is critical to treat her sleep apnea in order to avoid hospitalizations for heart failure. I'm referring her to Dr. Radford Pax.    Disposition:   FU with me in 1 month  Signed,  [name], MD  03/07/2016 2:42 PM    [facility]

## 2016-03-06 NOTE — Telephone Encounter (Signed)
Open error 

## 2016-03-06 NOTE — Patient Instructions (Signed)
Medication Instructions:  Your physician recommends that you continue on your current medications as directed. Please refer to the Current Medication list given to you today.  Labwork: Your physician recommends that you have lab work today: BMP, CBC and PT/INR  Testing/Procedures: Your physician has requested that you have a cardiac catheterization. Cardiac catheterization is used to diagnose and/or treat various heart conditions. Doctors may recommend this procedure for a number of different reasons. The most common reason is to evaluate chest pain. Chest pain can be a symptom of coronary artery disease (CAD), and cardiac catheterization can show whether plaque is narrowing or blocking your heart's arteries. This procedure is also used to evaluate the valves, as well as measure the blood flow and oxygen levels in different parts of your heart. For further information please visit [email or web address] Please follow instruction sheet, as given.  Follow-Up: Your physician recommends that you schedule a follow-up appointment in: 1 MONTH with Dr Kirke Corin  We will arrange NEW Patient Sleep Apnea evaluation with Dr Mayford Knife or Dr Tresa Endo.    Any Other Special Instructions Will Be Listed Below (If Applicable).     If you need a refill on your cardiac medications before your next appointment, please call your pharmacy.

## 2016-03-06 NOTE — Telephone Encounter (Signed)
atc pt X3, no answer, no vm.

## 2016-03-06 NOTE — Telephone Encounter (Signed)
Pt was seen was seen by her doctor on 03/01/16, she has not has a bowel movement since then. She is using laxatives,  Stool softener, liquids. Nothing she tries has worker and she doesn't want to end up in the hospital. Please call and let her know what she should do. jw

## 2016-03-07 ENCOUNTER — Encounter: Payer: Self-pay | Admitting: Cardiovascular Disease

## 2016-03-07 ENCOUNTER — Other Ambulatory Visit: Payer: Self-pay

## 2016-03-07 DIAGNOSIS — K5900 Constipation, unspecified: Secondary | ICD-10-CM | POA: Insufficient documentation

## 2016-03-07 HISTORY — DX: Constipation, unspecified: K59.00

## 2016-03-07 LAB — BASIC METABOLIC PANEL
BUN: 17 mg/dL (ref 7–25)
CALCIUM: 8.9 mg/dL (ref 8.6–10.2)
CHLORIDE: 89 mmol/L — AB (ref 98–110)
CO2: 32 mmol/L — AB (ref 20–31)
CREATININE: 0.87 mg/dL (ref 0.50–1.10)
GLUCOSE: 239 mg/dL — AB (ref 65–99)
Potassium: 3 mmol/L — ABNORMAL LOW (ref 3.5–5.3)
Sodium: 139 mmol/L (ref 135–146)

## 2016-03-07 LAB — CBC
HCT: 35.2 % (ref 35.0–45.0)
Hemoglobin: 10.9 g/dL — ABNORMAL LOW (ref 11.7–15.5)
MCH: 24.5 pg — AB (ref 27.0–33.0)
MCHC: 31 g/dL — AB (ref 32.0–36.0)
MCV: 79.1 fL — AB (ref 80.0–100.0)
PLATELETS: 192 10*3/uL (ref 140–400)
RBC: 4.45 MIL/uL (ref 3.80–5.10)
RDW: 19.2 % — AB (ref 11.0–15.0)
WBC: 12.7 10*3/uL — ABNORMAL HIGH (ref 3.8–10.8)

## 2016-03-07 MED ORDER — POTASSIUM CHLORIDE CRYS ER 20 MEQ PO TBCR: 20.0000 meq | EXTENDED_RELEASE_TABLET | Freq: Every day | ORAL | Status: DC

## 2016-03-07 MED ORDER — SENNOSIDES-DOCUSATE SODIUM 8.6-50 MG PO TABS: 2.0000 | ORAL_TABLET | Freq: Every day | ORAL | Status: DC

## 2016-03-07 MED ORDER — POLYETHYLENE GLYCOL 3350 17 GM/SCOOP PO POWD: 17.0000 g | Freq: Two times a day (BID) | ORAL | Status: DC | PRN

## 2016-03-07 NOTE — Telephone Encounter (Signed)
Patient calling again, requests to speak with Dr. Randolm Idol

## 2016-03-07 NOTE — Telephone Encounter (Signed)
Called patient pack. Has taken PO laxative (mild of magnesia) and enemas (fleet). No BM in close to a week. Feels pressure in rectum and abdomen.   Will send in prescription for Miralax and Senna-S.

## 2016-03-07 NOTE — Telephone Encounter (Signed)
Spoke with the pt  She states still having a lot of hoarseness and still feeling SOB since hospital d/c  OV with MW was scheduled  I asked her to bring all meds in hand

## 2016-03-08 ENCOUNTER — Emergency Department (HOSPITAL_COMMUNITY)
Admission: EM | Admit: 2016-03-08 | Discharge: 2016-03-08 | Disposition: A | Discharge status: Home / Self Care | Payer: BLUE CROSS/BLUE SHIELD | Attending: Emergency Medicine | Admitting: Emergency Medicine

## 2016-03-08 ENCOUNTER — Encounter (HOSPITAL_COMMUNITY): Payer: Self-pay

## 2016-03-08 DIAGNOSIS — Z3202 Encounter for pregnancy test, result negative: Secondary | ICD-10-CM | POA: Insufficient documentation

## 2016-03-08 DIAGNOSIS — Z7982 Long term (current) use of aspirin: Secondary | ICD-10-CM | POA: Insufficient documentation

## 2016-03-08 DIAGNOSIS — E119 Type 2 diabetes mellitus without complications: Secondary | ICD-10-CM | POA: Diagnosis not present

## 2016-03-08 DIAGNOSIS — Z8709 Personal history of other diseases of the respiratory system: Secondary | ICD-10-CM | POA: Insufficient documentation

## 2016-03-08 DIAGNOSIS — K5900 Constipation, unspecified: Secondary | ICD-10-CM

## 2016-03-08 DIAGNOSIS — I2510 Atherosclerotic heart disease of native coronary artery without angina pectoris: Secondary | ICD-10-CM | POA: Insufficient documentation

## 2016-03-08 DIAGNOSIS — Z79899 Other long term (current) drug therapy: Secondary | ICD-10-CM | POA: Insufficient documentation

## 2016-03-08 DIAGNOSIS — Z7984 Long term (current) use of oral hypoglycemic drugs: Secondary | ICD-10-CM | POA: Insufficient documentation

## 2016-03-08 DIAGNOSIS — I5042 Chronic combined systolic (congestive) and diastolic (congestive) heart failure: Secondary | ICD-10-CM | POA: Diagnosis not present

## 2016-03-08 DIAGNOSIS — Z794 Long term (current) use of insulin: Secondary | ICD-10-CM | POA: Diagnosis not present

## 2016-03-08 DIAGNOSIS — G4733 Obstructive sleep apnea (adult) (pediatric): Secondary | ICD-10-CM | POA: Diagnosis not present

## 2016-03-08 DIAGNOSIS — Z9981 Dependence on supplemental oxygen: Secondary | ICD-10-CM | POA: Insufficient documentation

## 2016-03-08 DIAGNOSIS — Z9889 Other specified postprocedural states: Secondary | ICD-10-CM | POA: Insufficient documentation

## 2016-03-08 DIAGNOSIS — Z9861 Coronary angioplasty status: Secondary | ICD-10-CM | POA: Diagnosis not present

## 2016-03-08 DIAGNOSIS — I10 Essential (primary) hypertension: Secondary | ICD-10-CM | POA: Diagnosis not present

## 2016-03-08 LAB — CBC
HEMATOCRIT: 33.6 % — AB (ref 36.0–46.0)
Hemoglobin: 10.2 g/dL — ABNORMAL LOW (ref 12.0–15.0)
MCH: 24.8 pg — AB (ref 26.0–34.0)
MCHC: 30.4 g/dL (ref 30.0–36.0)
MCV: 81.6 fL (ref 78.0–100.0)
PLATELETS: 194 10*3/uL (ref 150–400)
RBC: 4.12 MIL/uL (ref 3.87–5.11)
RDW: 18.5 % — AB (ref 11.5–15.5)
WBC: 11.1 10*3/uL — AB (ref 4.0–10.5)

## 2016-03-08 LAB — COMPREHENSIVE METABOLIC PANEL
ALBUMIN: 3.3 g/dL — AB (ref 3.5–5.0)
ALT: 30 U/L (ref 14–54)
AST: 32 U/L (ref 15–41)
Alkaline Phosphatase: 106 U/L (ref 38–126)
Anion gap: 20 — ABNORMAL HIGH (ref 5–15)
BILIRUBIN TOTAL: 1 mg/dL (ref 0.3–1.2)
BUN: 11 mg/dL (ref 6–20)
CHLORIDE: 96 mmol/L — AB (ref 101–111)
CO2: 24 mmol/L (ref 22–32)
CREATININE: 0.99 mg/dL (ref 0.44–1.00)
Calcium: 9.4 mg/dL (ref 8.9–10.3)
GFR calc Af Amer: >60 mL/min (ref >60)
GFR calc non Af Amer: >60 mL/min (ref >60)
GLUCOSE: 172 mg/dL — AB (ref 65–99)
Potassium: 3.4 mmol/L — ABNORMAL LOW (ref 3.5–5.1)
Sodium: 140 mmol/L (ref 135–145)
Total Protein: 6.3 g/dL — ABNORMAL LOW (ref 6.5–8.1)

## 2016-03-08 LAB — HCG, QUANTITATIVE, PREGNANCY: hCG, Beta Chain, Quant, S: <1 m[IU]/mL (ref <5)

## 2016-03-08 LAB — LIPASE, BLOOD: LIPASE: 59 U/L — AB (ref 11–51)

## 2016-03-08 NOTE — ED Notes (Signed)
Pt here with reports of constipation for a week now with no relief from OTC stool softeners and enemas. She reports she feels as though she has stool at her rectum but is unable to excrete it.

## 2016-03-08 NOTE — Discharge Instructions (Signed)

## 2016-03-08 NOTE — ED Provider Notes (Signed)
CSN: [id number]     Arrival date & time 03/08/16  1513 History   First MD Initiated Contact with Patient 03/08/16 1922     Chief Complaint  Patient presents with  . Constipation      HPI Presents the emergency department with complaints of constipation of the past week.  Patient has tried fleets enemas as well as MiraLAX without improvement in her symptoms.  She presents now complaining of ongoing sense of needing to have a bowel movement but unable to.  She was recently hospitalized after treatment for coronary artery disease and combined systolic heart failure with flash pulmonary edema and full mechanical ventilation and treatment in the ICU.  Denies abdominal pain.  No nausea or vomiting.  No fevers or chills.  Denies chest pain.     Past Medical History  Diagnosis Date  . Diabetes mellitus   . Hypertension     Does not see a cardiologist  . Sleep apnea     Uses CPAP  . Flash pulmonary edema ([place]) 02/10/2016    requiring intubation  . CAD (coronary artery disease) 02/10/2016    a. NSTEMI 02/2016: DES to mid-LAD, POBA to 1st Diag, 85% stenosis of RCA noted with staged PCI recommended  . ARF (acute respiratory failure) ([place]) 02/10/2016  . Chronic combined systolic (congestive) and diastolic (congestive) heart failure ([place])     a. Echo 02/2016: EF 40-45% w/ Grade 2 DD. 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   Past Surgical History  Procedure Laterality Date  . Breast surgery      cyst right breast  . Anterior cervical decomp/discectomy fusion  08/28/2012    Procedure: ANTERIOR CERVICAL DECOMPRESSION/DISCECTOMY FUSION 2 LEVELS;  Surgeon: Otilio Connors, MD;  Location: [facility];  Service: Neurosurgery;  Laterality: N/A;  Cervical four-five, cervical six-seven Anterior cervical decompression/diskectomy/fusion/LifeNet Bone/Trestle plate  . Cardiac catheterization N/A 02/15/2016    Procedure: Left Heart Cath and Coronary Angiography;  Surgeon: Wellington Hampshire, MD;  Location: [facility];  Service: Cardiovascular;  Laterality: N/A;  . Cardiac catheterization N/A 02/15/2016    Procedure: Coronary Stent Intervention;  Surgeon: Wellington Hampshire, MD;  Location: [facility];  Service: Cardiovascular;  Laterality: N/A;  . Cardiac catheterization N/A 02/15/2016    Procedure: Coronary Balloon Angioplasty;  Surgeon: Wellington Hampshire, MD;  Location: [facility];  Service: Cardiovascular;  Laterality: N/A;  . Coronary angioplasty with stent placement     Family History  Problem Relation Age of Onset  . Heart disease Paternal Grandmother 56  . Diabetes Paternal Grandmother   . Hypertension Paternal Grandmother   . Heart disease Paternal Aunt   . Cancer Neg Hx   . Stroke Neg Hx   . Alcohol abuse Neg Hx   . Heart disease Father 35    MI   Social History  Substance Use Topics  . Smoking status: Never Smoker   . Smokeless tobacco: Never Used  . Alcohol Use: Yes     Comment: socially on holidays   OB History    No data available     Review of Systems  All other systems reviewed and are negative.     Allergies  Ace inhibitors  Home Medications   Prior to Admission medications   Medication Sig Start Date End Date Taking? Authorizing Provider  acetaminophen (TYLENOL) 325 MG tablet Take 2 tablets (650 mg total) by mouth every 6 (six) hours as needed  for mild pain (or Fever >/= 101). 11/19/13   Olam Idler, MD  albuterol (PROVENTIL HFA;VENTOLIN HFA) 108 (90 Base) MCG/ACT inhaler Inhale 2 puffs into the lungs every 6 (six) hours as needed for wheezing or shortness of breath. 02/25/16   Jonetta Osgood, MD  amLODipine (NORVASC) 5 MG tablet Take 1 tablet (5 mg total) by mouth daily. 02/25/16   Shanker Kristeen Mans, MD  aspirin EC 81 MG EC tablet Take 1 tablet (81 mg total) by mouth daily. 11/19/13   Olam Idler, MD  atorvastatin (LIPITOR) 40 MG tablet Take 1 tablet (40 mg total) by mouth daily at 6 PM. 02/25/16   Jonetta Osgood, MD  blood glucose meter kit and supplies KIT Dispense based on patient and insurance preference. Use up to four times daily as directed. (FOR ICD-9 250.00, 250.01). 02/17/16   Theodis Blaze, MD  carvedilol (COREG) 12.5 MG tablet Take 1 tablet (12.5 mg total) by mouth 2 (two) times daily with a meal. 02/25/16   Jonetta Osgood, MD  insulin glargine (LANTUS) 100 UNIT/ML injection Inject 0.2 mLs (20 Units total) into the skin at bedtime. 02/25/16   Shanker Kristeen Mans, MD  metFORMIN (GLUCOPHAGE) 500 MG tablet Take 1 tablet (500 mg total) by mouth 2 (two) times daily with a meal. 02/25/16   Shanker Kristeen Mans, MD  omeprazole (PRILOSEC) 40 MG capsule Take 1 capsule (40 mg total) by mouth daily. 03/01/16   Lupita Dawn, MD  polyethylene glycol powder (GLYCOLAX/MIRALAX) powder Take 17 g by mouth 2 (two) times daily as needed. 03/07/16   Lupita Dawn, MD  potassium chloride SA (K-DUR,KLOR-CON) 20 MEQ tablet Take 1 tablet (20 mEq total) by mouth daily. 03/07/16   Wellington Hampshire, MD  senna-docusate (SENOKOT-S) 8.6-50 MG tablet Take 2 tablets by mouth daily. As needed for constipation. 03/07/16   Lupita Dawn, MD  ticagrelor (BRILINTA) 90 MG TABS tablet Take 1 tablet (90 mg total) by mouth 2 (two) times daily. 02/17/16   Theodis Blaze, MD   BP 128/59 mmHg  Pulse 69  Temp(Src) 98 F (36.7 C) (Oral)  Resp 16  SpO2 98%  LMP 02/19/2016 Physical Exam  Constitutional: She is oriented to person, place, and time. She appears well-developed and well-nourished.  HENT:  Head: Normocephalic.  Eyes: EOM are normal.  Neck: Normal range of motion.  Pulmonary/Chest: Effort normal.  Abdominal: She exhibits no distension.  Genitourinary:  Large stool burden on rectal exam.  No gross blood  Musculoskeletal: Normal range of motion.  Neurological: She is alert and oriented to person, place, and time.  Psychiatric: She has a normal mood and affect.  Nursing note and vitals reviewed.   ED Course  Fecal  disimpaction Performed by: Jola Schmidt Authorized by: Jola Schmidt Consent: Verbal consent obtained. Risks and benefits: risks, benefits and alternatives were discussed Consent given by: patient Patient identity confirmed: verbally with patient Time out: Immediately prior to procedure a "time out" was called to verify the correct patient, procedure, equipment, support staff and site/side marked as required. Local anesthesia used: no Patient sedated: no Patient tolerance: Patient tolerated the procedure well with no immediate complications   (including critical care time) Labs Review Labs Reviewed  LIPASE, BLOOD - Abnormal; Notable for the following:    Lipase 59 (*)    All other components within normal limits  COMPREHENSIVE METABOLIC PANEL - Abnormal; Notable for the following:    Potassium 3.4 (*)  Chloride 96 (*)    Glucose, Bld 172 (*)    Total Protein 6.3 (*)    Albumin 3.3 (*)    Anion gap 20 (*)    All other components within normal limits  CBC - Abnormal; Notable for the following:    WBC 11.1 (*)    Hemoglobin 10.2 (*)    HCT 33.6 (*)    MCH 24.8 (*)    RDW 18.5 (*)    All other components within normal limits  HCG, QUANTITATIVE, PREGNANCY  URINALYSIS, ROUTINE W REFLEX MICROSCOPIC (NOT AT [facility])    Imaging Review No results found. I have personally reviewed and evaluated these images and lab results as part of my medical decision-making.   EKG Interpretation None      MDM   Final diagnoses:  Constipation, unspecified constipation type    Fecal disimpaction.  Patient tolerated the procedure well.  She feels much better.  Abdominal exam benign.  Discharge home with primary care follow-up.  Twice a day MiraLAX.    Jola Schmidt, MD 03/08/16 [phone]

## 2016-03-08 NOTE — Telephone Encounter (Signed)
Patient called to report that she remains constipated. Feels hard stool in rectum. Has taken Miralax/Senna and multiple enemas. No same day appointments available in [facility]. Counseled patient to go to ER.

## 2016-03-09 ENCOUNTER — Ambulatory Visit: Payer: BLUE CROSS/BLUE SHIELD | Attending: Family Medicine | Admitting: Physical Therapy

## 2016-03-09 ENCOUNTER — Encounter: Payer: Self-pay | Admitting: Internal Medicine

## 2016-03-09 ENCOUNTER — Ambulatory Visit (INDEPENDENT_AMBULATORY_CARE_PROVIDER_SITE_OTHER): Payer: BLUE CROSS/BLUE SHIELD | Admitting: Internal Medicine

## 2016-03-09 ENCOUNTER — Encounter: Payer: Self-pay | Admitting: Gastroenterology

## 2016-03-09 ENCOUNTER — Ambulatory Visit: Payer: BLUE CROSS/BLUE SHIELD | Admitting: Physical Therapy

## 2016-03-09 VITALS — BP 146/88 | HR 66 | Ht 62.5 in | Wt 228.4 lb

## 2016-03-09 DIAGNOSIS — R1310 Dysphagia, unspecified: Secondary | ICD-10-CM

## 2016-03-09 DIAGNOSIS — R0600 Dyspnea, unspecified: Secondary | ICD-10-CM | POA: Insufficient documentation

## 2016-03-09 DIAGNOSIS — R942 Abnormal results of pulmonary function studies: Secondary | ICD-10-CM | POA: Insufficient documentation

## 2016-03-09 DIAGNOSIS — M6281 Muscle weakness (generalized): Secondary | ICD-10-CM | POA: Diagnosis present

## 2016-03-09 DIAGNOSIS — I10 Essential (primary) hypertension: Secondary | ICD-10-CM | POA: Diagnosis not present

## 2016-03-09 HISTORY — DX: Dysphagia, unspecified: R13.10

## 2016-03-09 HISTORY — DX: Dyspnea, unspecified: R06.00

## 2016-03-09 NOTE — Patient Instructions (Addendum)
Change prilosec Take 30- 60 min before your first and last meals of the day   GERD (REFLUX)  is an extremely common cause of respiratory symptoms just like yours , many times with no obvious heartburn at all.    It can be treated with medication, but also with lifestyle changes including elevation of the head of your bed (ideally with 6 inch  bed blocks),  Smoking cessation, avoidance of late meals, excessive alcohol, and avoid fatty foods, chocolate, peppermint, colas, red wine, and acidic juices such as orange juice.  NO MINT OR MENTHOL PRODUCTS SO NO COUGH DROPS  USE SUGARLESS CANDY INSTEAD (Jolley ranchers or Stover's or Life Savers) or even ice chips will also do - the key is to swallow to prevent all throat clearing. NO OIL BASED VITAMINS - use powdered substitutes.    Please see patient coordinator before you leave today  to schedule GI eval for trouble swallowing - no need to see ENT Pollyann Kennedy)   Pulmonary follow up is as needed for breathing problems

## 2016-03-09 NOTE — Progress Notes (Signed)
Subjective:     Patient ID: [name], female   DOB: 04/03/1966,    MRN: [id number]  HPI  49 yowf never regular smoker s/p admit for acute MI >  discharged 4/14 after being intubated for MI from LAD/diagonal blockage required stent and balloon angioplasty. She was extubated and on day of discharge she noted noisy breathing (documented in cards note) but was discharged to home. She reports her "noisy breathing" became unbearable and she presented 02/19/16  to ER\ with stridor. ENT Scoped her without obvious injury. Please note she has documented OSA with broken cpap machine x 3 years. Recent intubation, untreated OSA and ACE-I  Considered  contributing factors to stridor rec  PPI bid, steroids > reintubated   ETT- 4/16 > ext 4/20  And signed off by pulmonary 02/25/16   03/09/2016  Extended  Post hosp f/u ov/[name] re: last ACEi 02/19/16  Chief Complaint  Patient presents with  . Follow-up    seen in the hospital for her breathing issues.  still concerned about her breathing---feels like everytime she eats the food feels like it is stuck and will not go down.    finished pred 03/03/16 no worse since finished Still trouble with swallowing sensation solid food  stick about half way down unless chews well and did not have that before MI On ppi but not consistently ac  Really Not limited by breathing from desired activities  But still very sedentary   No obvious day to day or daytime variability or assoc excess/ purulent sputum or mucus plugs or hemoptysis or cp or chest tightness, subjective wheeze or overt sinus or hb symptoms. No unusual exp hx or h/o childhood pna/ asthma or knowledge of premature birth.  Sleeping ok without nocturnal  or early am exacerbation  of respiratory  c/o's or need for noct saba. Also denies any obvious fluctuation of symptoms with weather or environmental changes or other aggravating or alleviating factors except as outlined above   Current Medications, Allergies, Complete  Past Medical History, Past Surgical History, Family History, and Social History were reviewed in [employer] record.  ROS  The following are not active complaints unless bolded sore throat, dysphagia, dental problems, itching, sneezing,  nasal congestion or excess/ purulent secretions, ear ache,   fever, chills, sweats, unintended wt loss, classically pleuritic or exertional cp,  orthopnea pnd or leg swelling, presyncope, palpitations, abdominal pain, anorexia, nausea, vomiting, diarrhea  or change in bowel or bladder habits, change in stools or urine, dysuria,hematuria,  rash, arthralgias, visual complaints, headache, numbness, weakness or ataxia or problems with walking or coordination,  change in mood/affect or memory.            Review of Systems     Objective:   Physical Exam    amb obese wf nad with good phonation   Wt Readings from Last 3 Encounters:  03/10/16 233 lb 4.8 oz (105.824 kg)  03/09/16 228 lb 6 oz (103.59 kg)  03/06/16 222 lb 3.2 oz (100.789 kg)    Vital signs reviewed   HEENT: nl dentition, turbinates, and oropharynx. Nl external ear canals without cough reflex   NECK :  without JVD/Nodes/TM/ nl carotid upstrokes bilaterally   LUNGS: no acc muscle use,  Nl contour chest which is clear to A and P bilaterally without cough on insp or exp maneuvers   CV:  RRR  no s3 or murmur or increase in P2, no edema   ABD:  Quite obese  but soft and nontender with nl inspiratory excursion in the supine position. No bruits or organomegaly, bowel sounds nl  MS:  Nl gait/ ext warm without deformities, calf tenderness, cyanosis or clubbing No obvious joint restrictions   SKIN: warm and dry without lesions    NEURO:  alert, approp, nl sensorium with  no motor deficits     Assessment:

## 2016-03-09 NOTE — Therapy (Signed)
[facility] [address] [place], [place], [address] Phone: [phone]   Fax:  [phone]  Physical Therapy Evaluation  Patient Details  Name: [name] MRN: [id number] Date of Birth: 1965-12-11 Referring Provider: Donnella Sham, MD  Encounter Date: 03/09/2016      PT End of Session - 03/09/16 1449    Visit Number 1   PT Start Time 1402   PT Stop Time 1435   PT Time Calculation (min) 33 min   Equipment Utilized During Treatment Gait belt   Activity Tolerance Patient tolerated treatment well   Behavior During Therapy [facility] for tasks assessed/performed      Past Medical History  Diagnosis Date  . Diabetes mellitus   . Hypertension     Does not see a cardiologist  . Sleep apnea     Uses CPAP  . Flash pulmonary edema ([facility]) 02/10/2016    requiring intubation  . CAD (coronary artery disease) 02/10/2016    a. NSTEMI 02/2016: DES to mid-LAD, POBA to 1st Diag, 85% stenosis of RCA noted with staged PCI recommended  . ARF (acute respiratory failure) ([facility]) 02/10/2016  . Chronic combined systolic (congestive) and diastolic (congestive) heart failure ([facility])     a. Echo 02/2016: EF 40-45% w/ Grade 2 DD. 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    Past Surgical History  Procedure Laterality Date  . Breast surgery      cyst right breast  . Anterior cervical decomp/discectomy fusion  08/28/2012    Procedure: ANTERIOR CERVICAL DECOMPRESSION/DISCECTOMY FUSION 2 LEVELS;  Surgeon: Clydene Fake, MD;  Location: [facility];  Service: Neurosurgery;  Laterality: N/A;  Cervical four-five, cervical six-seven Anterior cervical decompression/diskectomy/fusion/LifeNet Bone/Trestle plate  . Cardiac catheterization N/A 02/15/2016    Procedure: Left Heart Cath and Coronary Angiography;  Surgeon: Iran Ouch, MD;  Location: [facility];  Service: Cardiovascular;  Laterality: N/A;  . Cardiac  catheterization N/A 02/15/2016    Procedure: Coronary Stent Intervention;  Surgeon: Iran Ouch, MD;  Location: [facility];  Service: Cardiovascular;  Laterality: N/A;  . Cardiac catheterization N/A 02/15/2016    Procedure: Coronary Balloon Angioplasty;  Surgeon: Iran Ouch, MD;  Location: [facility];  Service: Cardiovascular;  Laterality: N/A;  . Coronary angioplasty with stent placement      There were no vitals filed for this visit.       Subjective Assessment - 03/09/16 1404    Subjective Pt is a 50 y/o female who presents to OPPT s/p MI in April 2017 (4/16-4/22).  Pt admitted to hospital x 1 week requiring intubation.  Pt also with fall upon coming home on 02/27/16.  Pt also reports lightheaded sensation.     Pertinent History DM, HTN, CAD, MI, CHF   Patient Stated Goals Improve walking and balance   Currently in Pain? No/denies            [facility] PT Assessment - 03/09/16 1407    Assessment   Medical Diagnosis fall/balance   Referring Provider Donnella Sham, MD   Onset Date/Surgical Date 02/27/16   Next MD Visit 03/15/16   Prior Therapy none   Precautions   Precautions Fall   Restrictions   Weight Bearing Restrictions No   Balance Screen   Has the patient fallen in the past 6 months Yes   How many times? 1-pt thinks she tripped but daughter feels otherwise   Has the patient  had a decrease in activity level because of a fear of falling?  Yes   Is the patient reluctant to leave their home because of a fear of falling?  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   Leisure  visit with friends, relax   Strength   Overall Strength Comments tested in sitting   Strength Assessment Site Hip;Knee;Ankle   Right Hip Flexion 4/5   Left Hip Flexion 4/5   Right/Left Knee Right;Left   Right Knee Flexion 4/5   Right Knee Extension 5/5   Left Knee Flexion 5/5   Left Knee Extension 5/5   Right Ankle Dorsiflexion 5/5   Left Ankle Dorsiflexion 5/5   Ambulation/Gait   Ambulation Distance (Feet) 150 Feet   Assistive device None   Gait Pattern Within Functional Limits   Gait velocity 3.01 ft/sec  10.91   Standardized Balance Assessment   Standardized Balance Assessment Berg Balance Test;Timed Up and Go Test   Berg Balance Test   Sit to Stand Able to stand without using hands and stabilize independently   Standing Unsupported Able to stand safely 2 minutes   Sitting with Back Unsupported but Feet Supported on Floor or Stool Able to sit safely and securely 2 minutes   Stand to Sit Sits safely with minimal use of hands   Transfers Able to transfer safely, minor use of hands   Standing Unsupported with Eyes Closed Able to stand 10 seconds safely   Standing Ubsupported with Feet Together Able to place feet together independently and stand 1 minute safely   From Standing, Reach Forward with Outstretched Arm Can reach confidently >25 cm (10")   From Standing Position, Pick up Object from Floor Able to pick up shoe safely and easily   From Standing Position, Turn to Look Behind Over each Shoulder Looks behind from both sides and weight shifts well   Turn 360 Degrees Able to turn 360 degrees safely in 4 seconds or less   Standing Unsupported, Alternately Place Feet on Step/Stool Able to stand independently and safely and complete 8 steps in 20 seconds   Standing Unsupported, One Foot in Front Able to place foot tandem independently and hold 30 seconds   Standing on One Leg Able to lift leg independently and hold > 10 seconds   Total Score 56   Timed Up and Go Test   Normal TUG  (seconds) 11.18                           PT Education - 03/09/16 1449    Education provided Yes   Education Details clinical findings; recommendation for cardiac rehab   Person(s) Educated Patient;Child(ren)   Methods Explanation   Comprehension Verbalized understanding                    Plan - 03/09/16 1449    Clinical Impression Statement Pt is a 50 y/o female who presents to OPPT for a low complexity evaluation due to a fall after d/c from the hospital.  Pt today scored 56/56 on BERG balance test indicating very low risk for falls.  Feel pt likely with some mild deficits and decreased strength upon d/c due to prolonged hospitalization and bed rest but at this time pt mobilizing well without significant deviations.  Recommend cardiac rehab phase II to transition to community fitness.  Pt agreeable.   PT Frequency One time visit   Recommended Other Services cardiac rehab   Consulted and Agree with Plan of Care Patient;Family member/caregiver   Family Member Consulted daughter      Patient will benefit from skilled therapeutic intervention in order to improve the following deficits and impairments:     Visit Diagnosis: Muscle weakness (generalized) - Plan: PT plan of care cert/re-cert     Problem List Patient Active Problem List   Diagnosis Date Noted  . Dyspnea 03/09/2016  . Dysphagia 03/09/2016  . Constipation 03/07/2016  . Fall 03/01/2016  . OSA (obstructive sleep apnea)   . Mitral valve mass   . CAD, multiple vessel 02/20/2016  . Encounter for orogastric tube placement   . Stridor 02/19/2016  . Hypertensive heart disease with heart failure ([facility])   . Flash pulmonary edema ([facility])   . NSTEMI (non-ST elevated myocardial infarction) ([facility])   . Required emergent intubation 02/10/2016  . Orthostatic hypotension 04/27/2014  . Meralgia paresthetica of right side 12/18/2013  . GERD (gastroesophageal reflux disease) 12/03/2013  . Chronic  combined systolic (congestive) and diastolic (congestive) heart failure ([facility]) 11/16/2013  . Degenerative disk disease 08/12/2012  . Hyperlipidemia LDL goal < 100 08/11/2012  . Hypertension 07/26/2012  . Diabetes mellitus, type 2 (HCC) 07/26/2012  . Obesity 07/26/2012   Clarita Crane, PT, DPT 03/09/2016 2:55 PM   [facility] [address] [place], [place], [address] Phone: [phone]   Fax:  [phone]  Name: [name] MRN: [id number] Date of Birth: Sep 16, 1966

## 2016-03-10 ENCOUNTER — Emergency Department (HOSPITAL_COMMUNITY)
Admission: EM | Admit: 2016-03-10 | Discharge: 2016-03-10 | Disposition: A | Discharge status: Home / Self Care | Payer: BLUE CROSS/BLUE SHIELD | Attending: Emergency Medicine | Admitting: Emergency Medicine

## 2016-03-10 ENCOUNTER — Emergency Department (HOSPITAL_COMMUNITY): Payer: BLUE CROSS/BLUE SHIELD

## 2016-03-10 ENCOUNTER — Encounter (HOSPITAL_COMMUNITY): Payer: Self-pay

## 2016-03-10 DIAGNOSIS — Z9861 Coronary angioplasty status: Secondary | ICD-10-CM | POA: Insufficient documentation

## 2016-03-10 DIAGNOSIS — Z9981 Dependence on supplemental oxygen: Secondary | ICD-10-CM | POA: Insufficient documentation

## 2016-03-10 DIAGNOSIS — E119 Type 2 diabetes mellitus without complications: Secondary | ICD-10-CM | POA: Diagnosis not present

## 2016-03-10 DIAGNOSIS — Z8709 Personal history of other diseases of the respiratory system: Secondary | ICD-10-CM | POA: Insufficient documentation

## 2016-03-10 DIAGNOSIS — G4730 Sleep apnea, unspecified: Secondary | ICD-10-CM | POA: Insufficient documentation

## 2016-03-10 DIAGNOSIS — Z79899 Other long term (current) drug therapy: Secondary | ICD-10-CM | POA: Diagnosis not present

## 2016-03-10 DIAGNOSIS — I2510 Atherosclerotic heart disease of native coronary artery without angina pectoris: Secondary | ICD-10-CM | POA: Insufficient documentation

## 2016-03-10 DIAGNOSIS — Z7984 Long term (current) use of oral hypoglycemic drugs: Secondary | ICD-10-CM | POA: Insufficient documentation

## 2016-03-10 DIAGNOSIS — E669 Obesity, unspecified: Secondary | ICD-10-CM | POA: Diagnosis not present

## 2016-03-10 DIAGNOSIS — I5042 Chronic combined systolic (congestive) and diastolic (congestive) heart failure: Secondary | ICD-10-CM | POA: Insufficient documentation

## 2016-03-10 DIAGNOSIS — K5900 Constipation, unspecified: Secondary | ICD-10-CM | POA: Diagnosis present

## 2016-03-10 DIAGNOSIS — Z794 Long term (current) use of insulin: Secondary | ICD-10-CM | POA: Diagnosis not present

## 2016-03-10 DIAGNOSIS — I10 Essential (primary) hypertension: Secondary | ICD-10-CM | POA: Insufficient documentation

## 2016-03-10 DIAGNOSIS — Z7982 Long term (current) use of aspirin: Secondary | ICD-10-CM | POA: Insufficient documentation

## 2016-03-10 MED ORDER — DOCUSATE SODIUM 100 MG PO CAPS: 100.0000 mg | ORAL_CAPSULE | Freq: Two times a day (BID) | ORAL | Status: DC

## 2016-03-10 NOTE — Assessment & Plan Note (Addendum)
Mild restrictive changes on spirometry 03/09/2016   Body mass index is 41.08    Lab Results  Component Value Date   TSH 1.519 07/21/2010     Contributing to gerd tendency/ doe/reviewed the need and the process to achieve and maintain neg calorie balance > defer f/u primary care including intermittently monitoring thyroid status

## 2016-03-10 NOTE — Assessment & Plan Note (Signed)
In the best review of chronic cough to date ( NEJM 2016 375 [id number]) ,  ACEi are now felt to cause cough in up to  20% of pts which is a 4 fold increase from previous reports and does not include the variety of non-specific complaints we see in pulmonary clinic in pts on ACEi but previously attributed to another dx like  Copd/asthma and  include PNDS, throat and chest congestion, "bronchitis", unexplained dyspnea and noct "strangling" sensations, and hoarseness, but also  atypical /refractory GERD symptoms like dysphagia and "bad heartburn"   The only way I know  to prove this is not an "ACEi Case" is a trial off ACEi x a minimum of 6 weeks then regroup. She is only off x 3 weeks and much better x for dysphagia with symptoms below the upper airway so re leave off acei and explore this problem separately via GI consult

## 2016-03-10 NOTE — Discharge Instructions (Signed)
Important for you to drink lots of water, at least 10, 8 ounce glasses per day to help with her constipation. You may increase your dose of MiraLAX and take one capsule every 2 hours until you're having a bowel movement and then you may take one capsule daily to stay regular. Please take your Colace to help soften your stool. Follow-up with your doctor in the next 2 or 3 days for reevaluation. Return to ED for new or worsening symptoms as we discussed.  Constipation, Adult Constipation is when a person has fewer than three bowel movements a week, has difficulty having a bowel movement, or has stools that are dry, hard, or larger than normal. As people grow older, constipation is more common. A low-fiber diet, not taking in enough fluids, and taking certain medicines may make constipation worse.  CAUSES   Certain medicines, such as antidepressants, pain medicine, iron supplements, antacids, and water pills.   Certain diseases, such as diabetes, irritable bowel syndrome (IBS), thyroid disease, or depression.   Not drinking enough water.   Not eating enough fiber-rich foods.   Stress or travel.   Lack of physical activity or exercise.   Ignoring the urge to have a bowel movement.   Using laxatives too much.  SIGNS AND SYMPTOMS   Having fewer than three bowel movements a week.   Straining to have a bowel movement.   Having stools that are hard, dry, or larger than normal.   Feeling full or bloated.   Pain in the lower abdomen.   Not feeling relief after having a bowel movement.  DIAGNOSIS  Your health care provider will take a medical history and perform a physical exam. Further testing may be done for severe constipation. Some tests may include:  A barium enema X-ray to examine your rectum, colon, and, sometimes, your small intestine.   A sigmoidoscopy to examine your lower colon.   A colonoscopy to examine your entire colon. TREATMENT  Treatment will depend on  the severity of your constipation and what is causing it. Some dietary treatments include drinking more fluids and eating more fiber-rich foods. Lifestyle treatments may include regular exercise. If these diet and lifestyle recommendations do not help, your health care provider may recommend taking over-the-counter laxative medicines to help you have bowel movements. Prescription medicines may be prescribed if over-the-counter medicines do not work.  HOME CARE INSTRUCTIONS   Eat foods that have a lot of fiber, such as fruits, vegetables, whole grains, and beans.  Limit foods high in fat and processed sugars, such as french fries, hamburgers, cookies, candies, and soda.   A fiber supplement may be added to your diet if you cannot get enough fiber from foods.   Drink enough fluids to keep your urine clear or pale yellow.   Exercise regularly or as directed by your health care provider.   Go to the restroom when you have the urge to go. Do not hold it.   Only take over-the-counter or prescription medicines as directed by your health care provider. Do not take other medicines for constipation without talking to your health care provider first.  SEEK IMMEDIATE MEDICAL CARE IF:   You have bright red blood in your stool.   Your constipation lasts for more than 4 days or gets worse.   You have abdominal or rectal pain.   You have thin, pencil-like stools.   You have unexplained weight loss. MAKE SURE YOU:   Understand these instructions.  Will watch your  condition.  Will get help right away if you are not doing well or get worse.   This information is not intended to replace advice given to you by your health care provider. Make sure you discuss any questions you have with your health care provider.   Document Released: 07/20/2004 Document Revised: 11/12/2014 Document Reviewed: 08/03/2013 Elsevier Interactive Patient Education 2016 Elsevier Inc.  High-Fiber Diet Fiber,  also called dietary fiber, is a type of carbohydrate found in fruits, vegetables, whole grains, and beans. A high-fiber diet can have many health benefits. Your health care provider may recommend a high-fiber diet to help:  Prevent constipation. Fiber can make your bowel movements more regular.  Lower your cholesterol.  Relieve hemorrhoids, uncomplicated diverticulosis, or irritable bowel syndrome.  Prevent overeating as part of a weight-loss plan.  Prevent heart disease, type 2 diabetes, and certain cancers. WHAT IS MY PLAN? The recommended daily intake of fiber includes:  38 grams for men under age 59.  30 grams for men over age 31.  25 grams for women under age 81.  21 grams for women over age 62. You can get the recommended daily intake of dietary fiber by eating a variety of fruits, vegetables, grains, and beans. Your health care provider may also recommend a fiber supplement if it is not possible to get enough fiber through your diet. WHAT DO I NEED TO KNOW ABOUT A HIGH-FIBER DIET?  Fiber supplements have not been widely studied for their effectiveness, so it is better to get fiber through food sources.  Always check the fiber content on thenutrition facts label of any prepackaged food. Look for foods that contain at least 5 grams of fiber per serving.  Ask your dietitian if you have questions about specific foods that are related to your condition, especially if those foods are not listed in the following section.  Increase your daily fiber consumption gradually. Increasing your intake of dietary fiber too quickly may cause bloating, cramping, or gas.  Drink plenty of water. Water helps you to digest fiber. WHAT FOODS CAN I EAT? Grains Whole-grain breads. Multigrain cereal. Oats and oatmeal. Brown rice. Barley. Bulgur wheat. Millet. Bran muffins. Popcorn. Rye wafer crackers. Vegetables Sweet potatoes. Spinach. Kale. Artichokes. Cabbage. Broccoli. Green peas. Carrots.  Squash. Fruits Berries. Pears. Apples. Oranges. Avocados. Prunes and raisins. Dried figs. Meats and Other Protein Sources Navy, kidney, pinto, and soy beans. Split peas. Lentils. Nuts and seeds. Dairy Fiber-fortified yogurt. Beverages Fiber-fortified soy milk. Fiber-fortified orange juice. Other Fiber bars. The items listed above may not be a complete list of recommended foods or beverages. Contact your dietitian for more options. WHAT FOODS ARE NOT RECOMMENDED? Grains White bread. Pasta made with refined flour. White rice. Vegetables Fried potatoes. Canned vegetables. Well-cooked vegetables.  Fruits Fruit juice. Cooked, strained fruit. Meats and Other Protein Sources Fatty cuts of meat. Fried Environmental education officer or fried fish. Dairy Milk. Yogurt. Cream cheese. Sour cream. Beverages Soft drinks. Other Cakes and pastries. Butter and oils. The items listed above may not be a complete list of foods and beverages to avoid. Contact your dietitian for more information. WHAT ARE SOME TIPS FOR INCLUDING HIGH-FIBER FOODS IN MY DIET?  Eat a wide variety of high-fiber foods.  Make sure that half of all grains consumed each day are whole grains.  Replace breads and cereals made from refined flour or white flour with whole-grain breads and cereals.  Replace white rice with brown rice, bulgur wheat, or millet.  Start the day with  a breakfast that is high in fiber, such as a cereal that contains at least 5 grams of fiber per serving.  Use beans in place of meat in soups, salads, or pasta.  Eat high-fiber snacks, such as berries, raw vegetables, nuts, or popcorn.   This information is not intended to replace advice given to you by your health care provider. Make sure you discuss any questions you have with your health care provider.   Document Released: 10/22/2005 Document Revised: 11/12/2014 Document Reviewed: 04/06/2014 Elsevier Interactive Patient Education [employer]

## 2016-03-10 NOTE — ED Notes (Signed)
Pt stable, ambulatory, states understanding of discharge instructions 

## 2016-03-10 NOTE — Assessment & Plan Note (Signed)
Off ACEi since 02/19/16 -Spirometry 03/09/2016  No airflow obst   Symptoms are markedly disproportionate to objective findings and not clear this is a lung problem but pt does appear to have difficult airway management issues. DDX of  difficult airways management almost all start with A and  include Adherence, Ace Inhibitors, Acid Reflux, Active Sinus Disease, Alpha 1 Antitripsin deficiency, Anxiety masquerading as Airways dz,  ABPA,  Allergy(esp in young), Aspiration (esp in elderly), Adverse effects of meds,  Active smokers, A bunch of PE's (a small clot burden can't cause this syndrome unless there is already severe underlying pulm or vascular dz with poor reserve) plus two Bs  = Bronchiectasis and Beta blocker use..and one C= CHF  Adherence is always the initial "prime suspect" and is a multilayered concern that requires a "trust but verify" approach in every patient - starting with knowing how to use medications, especially inhalers, correctly, keeping up with refills and understanding the fundamental difference between maintenance and prns vs those medications only taken for a very short course and then stopped and not refilled.   ? Acid (or non-acid) GERD > always difficult to exclude as up to 75% of pts in some series report no assoc GI/ Heartburn symptoms> rec max (24h)  acid suppression and diet restrictions/ reviewed and instructions given in writing > f/u GI rec  ? ACEi > off x 3 weeks, should gradually improve x next 3 weeks  ? Anxiety related to obesity/ deconditioning > dx of exclusion   ? BB effects > not likely s evidence of airflow obst and on such low doses of coreg   I had an extended discussion with the patient reviewing all relevant studies completed to date(including reviewing admit notes with her in detail)  and  lasting 25  minutes of a 40 minute transition of care ov  No need for ENT f/u but GI eval rec for ongoing dysphagia (see acei discussion)  Each maintenance medication  was reviewed in detail including most importantly the difference between maintenance and prns and under what circumstances the prns are to be triggered using an action plan format that is not reflected in the computer generated alphabetically organized AVS.    Please see instructions for details which were reviewed in writing and the patient given a copy highlighting the part that I personally wrote and discussed at today's ov.

## 2016-03-10 NOTE — ED Notes (Signed)
Patient here for further evaluation of ongoing constipation. Was disimpacted earlier in week and reports passing no stool since leaving ED

## 2016-03-10 NOTE — ED Provider Notes (Signed)
CSN: [id number]     Arrival date & time 03/10/16  1228 History   First MD Initiated Contact with Patient 03/10/16 1600     Chief Complaint  Patient presents with  . Constipation     (Consider location/radiation/quality/duration/timing/severity/associated sxs/prior Treatment) HPI [name] is a 50 y.o. female history of hypertension, diabetes, coronary artery disease, comes in for evaluation of constipation. Patient per she has had constipation over the past 1 week. She reports being seen in the emergency department on 5/4, had a fecal disimpaction which relieved her discomfort. She has been trying fleets enemas, MiraLAX twice a day without relief of her symptoms. Reports she has not had a bowel movement since 5/4. Denies any other nausea, vomiting, abdominal pain, urinary symptoms, fevers or chills. Denies any opioid medications.  Past Medical History  Diagnosis Date  . Diabetes mellitus   . Hypertension     Does not see a cardiologist  . Sleep apnea     Uses CPAP  . Flash pulmonary edema ([place]) 02/10/2016    requiring intubation  . CAD (coronary artery disease) 02/10/2016    a. NSTEMI 02/2016: DES to mid-LAD, POBA to 1st Diag, 85% stenosis of RCA noted with staged PCI recommended  . ARF (acute respiratory failure) ([place]) 02/10/2016  . Chronic combined systolic (congestive) and diastolic (congestive) heart failure ([place])     a. Echo 02/2016: EF 40-45% w/ Grade 2 DD. 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   Past Surgical History  Procedure Laterality Date  . Breast surgery      cyst right breast  . Anterior cervical decomp/discectomy fusion  08/28/2012    Procedure: ANTERIOR CERVICAL DECOMPRESSION/DISCECTOMY FUSION 2 LEVELS;  Surgeon: Otilio Connors, MD;  Location: [facility];  Service: Neurosurgery;  Laterality: N/A;  Cervical four-five, cervical six-seven Anterior cervical decompression/diskectomy/fusion/LifeNet Bone/Trestle plate  . Cardiac  catheterization N/A 02/15/2016    Procedure: Left Heart Cath and Coronary Angiography;  Surgeon: Wellington Hampshire, MD;  Location: [facility];  Service: Cardiovascular;  Laterality: N/A;  . Cardiac catheterization N/A 02/15/2016    Procedure: Coronary Stent Intervention;  Surgeon: Wellington Hampshire, MD;  Location: [facility];  Service: Cardiovascular;  Laterality: N/A;  . Cardiac catheterization N/A 02/15/2016    Procedure: Coronary Balloon Angioplasty;  Surgeon: Wellington Hampshire, MD;  Location: [facility];  Service: Cardiovascular;  Laterality: N/A;  . Coronary angioplasty with stent placement     Family History  Problem Relation Age of Onset  . Heart disease Paternal Grandmother 41  . Diabetes Paternal Grandmother   . Hypertension Paternal Grandmother   . Heart disease Paternal Aunt   . Cancer Neg Hx   . Stroke Neg Hx   . Alcohol abuse Neg Hx   . Heart disease Father 39    MI   Social History  Substance Use Topics  . Smoking status: Never Smoker   . Smokeless tobacco: Never Used  . Alcohol Use: Yes     Comment: socially on holidays   OB History    No data available     Review of Systems A 10 point review of systems was completed and was negative except for pertinent positives and negatives as mentioned in the history of present illness     Allergies  Ace inhibitors  Home Medications   Prior to Admission medications   Medication Sig Start Date End Date Taking? Authorizing Provider  acetaminophen (TYLENOL) 325  MG tablet Take 2 tablets (650 mg total) by mouth every 6 (six) hours as needed for mild pain (or Fever >/= 101). 11/19/13  Yes Olam Idler, MD  albuterol (PROVENTIL HFA;VENTOLIN HFA) 108 (90 Base) MCG/ACT inhaler Inhale 2 puffs into the lungs every 6 (six) hours as needed for wheezing or shortness of breath. 02/25/16  Yes Shanker Kristeen Mans, MD  amLODipine (NORVASC) 5 MG tablet Take 1 tablet (5 mg total) by mouth daily. 02/25/16  Yes Shanker Kristeen Mans, MD  aspirin EC 81 MG EC tablet Take 1 tablet (81 mg total) by mouth daily. 11/19/13  Yes Olam Idler, MD  atorvastatin (LIPITOR) 40 MG tablet Take 1 tablet (40 mg total) by mouth daily at 6 PM. 02/25/16  Yes Shanker Kristeen Mans, MD  carvedilol (COREG) 12.5 MG tablet Take 1 tablet (12.5 mg total) by mouth 2 (two) times daily with a meal. 02/25/16  Yes Shanker Kristeen Mans, MD  insulin glargine (LANTUS) 100 UNIT/ML injection Inject 0.2 mLs (20 Units total) into the skin at bedtime. 02/25/16  Yes Shanker Kristeen Mans, MD  metFORMIN (GLUCOPHAGE) 500 MG tablet Take 1 tablet (500 mg total) by mouth 2 (two) times daily with a meal. 02/25/16  Yes Shanker Kristeen Mans, MD  omeprazole (PRILOSEC) 40 MG capsule Take 1 capsule (40 mg total) by mouth daily. 03/01/16  Yes Lupita Dawn, MD  polyethylene glycol powder (GLYCOLAX/MIRALAX) powder Take 17 g by mouth 2 (two) times daily as needed. Patient taking differently: Take 17 g by mouth daily.  03/07/16  Yes Lupita Dawn, MD  potassium chloride SA (K-DUR,KLOR-CON) 20 MEQ tablet Take 1 tablet (20 mEq total) by mouth daily. 03/07/16  Yes Wellington Hampshire, MD  senna-docusate (SENOKOT-S) 8.6-50 MG tablet Take 2 tablets by mouth daily. As needed for constipation. 03/07/16  Yes Lupita Dawn, MD  ticagrelor (BRILINTA) 90 MG TABS tablet Take 1 tablet (90 mg total) by mouth 2 (two) times daily. 02/17/16  Yes Theodis Blaze, MD  blood glucose meter kit and supplies KIT Dispense based on patient and insurance preference. Use up to four times daily as directed. (FOR ICD-9 250.00, 250.01). 02/17/16   Theodis Blaze, MD  docusate sodium (COLACE) 100 MG capsule Take 1 capsule (100 mg total) by mouth every 12 (twelve) hours. 03/10/16   Dellas Guard, PA-C   BP 162/79 mmHg  Pulse 66  Temp(Src) 97.5 F (36.4 C) (Oral)  Resp 18  Ht _0  (1.575 m)  Wt 105.824 kg  BMI 42.66 kg/m2  SpO2 99%  LMP 02/19/2016 Physical Exam  Constitutional: She is oriented to person, place, and time. She  appears well-developed and well-nourished.  Obese Caucasian female  HENT:  Head: Normocephalic and atraumatic.  Mouth/Throat: Oropharynx is clear and moist.  Eyes: Conjunctivae are normal. Pupils are equal, round, and reactive to light. Right eye exhibits no discharge. Left eye exhibits no discharge. No scleral icterus.  Neck: Neck supple.  Cardiovascular: Normal rate, regular rhythm and normal heart sounds.   Pulmonary/Chest: Effort normal and breath sounds normal. No respiratory distress. She has no wheezes. She has no rales.  Abdominal: Soft. There is no tenderness.  Musculoskeletal: She exhibits no tenderness.  Neurological: She is alert and oriented to person, place, and time.  Cranial Nerves II-XII grossly intact  Skin: Skin is warm and dry. No rash noted.  Psychiatric: She has a normal mood and affect.  Nursing note and vitals reviewed.   ED Course  Fecal disimpaction Date/Time: 03/10/2016 5:53 PM Performed by: Comer Locket Authorized by: Comer Locket Consent: Verbal consent obtained. Risks and benefits: risks, benefits and alternatives were discussed Consent given by: patient Patient understanding: patient states understanding of the procedure being performed Patient identity confirmed: verbally with patient Time out: Immediately prior to procedure a "time out" was called to verify the correct patient, procedure, equipment, support staff and site/side marked as required. Local anesthesia used: no Patient sedated: no Comments: Stool ball in rectal vault broken up successfully.   (including critical care time) Labs Review Labs Reviewed - No data to display  Imaging Review Dg Abd Acute W/chest  03/10/2016  CLINICAL DATA:  50 year old female with a history of constipation. EXAM: DG ABDOMEN ACUTE W/ 1V CHEST COMPARISON:  02/23/2016 FINDINGS: Chest: Cardiomediastinal silhouette unchanged, with cardiomegaly. In the interval, there has been removal of gastric tube and  endotracheal tube. Similar appearance of surgical changes of the cervical region. Low lung volumes with bilateral interstitial opacity, similar to comparison. No pneumothorax. Abdomen: Gas within stomach, small bowel, colon. No abnormally distended small bowel or colon. Formed stool within the right colon, sigmoid colon and rectum. No unexpected calcifications. No unexpected radiopaque foreign body. Degenerative changes of the lower lumbar spine. IMPRESSION: Chest: Low lung volumes with likely atelectasis, similar to prior. Interval removal of gastric tube and endotracheal tube. Abdomen: Moderate stool burden without evidence of obstruction. Signed, Dulcy Fanny. Earleen Newport, DO [facility] Rad[facility] Electronically Signed   By: Corrie Mckusick D.O.   On: 03/10/2016 17:15   I have personally reviewed and evaluated these images and lab results as part of my medical decision-making.   EKG Interpretation None      MDM  Natash MYISHIA KASIK is a 50 y.o. female here for evaluation of constipation. Plain film of her abdomen shows no obstruction, moderate stool burden.  Fecal disimpaction performed with success. Encourage increasing dose of MiraLAX, increase water intake. Will prescribe Colace. Discussed dietary modifications. Follow-up with PCP next week. Hemodynamically stable and appropriate for discharge. Return precautions discussed. Final diagnoses:  Constipation, unspecified constipation type        Comer Locket, PA-C 03/10/16 [place], MD 03/10/16 [id number]

## 2016-03-10 NOTE — Assessment & Plan Note (Signed)
Referred to GI [place] 03/09/2016   Max gerd diet/ meds reviewed - see avs

## 2016-03-12 ENCOUNTER — Telehealth: Payer: Self-pay | Admitting: Family Medicine

## 2016-03-12 NOTE — Telephone Encounter (Signed)
Pt called and would like the doctor to write an extension on her days out of work. She is having a stint put in on 03/15/16 and she needs the extra days off so that she doesn't have to start the process again. She is thinking that after a stint she has to be out maybe 5 more days? Please call patient once letter in written so that she can pick this. jw

## 2016-03-12 NOTE — Telephone Encounter (Signed)
OK fine to write letter but I do not know when she was writte out originally. qwhat is beginning date? You can give her out until 03/20/2016 as her stent is scheduled for 5/11--but when did she start her leave? OK for you to wtrte letter in my name once you find this out--I do not see a letter in her chart. Denny Levy

## 2016-03-14 ENCOUNTER — Encounter (HOSPITAL_COMMUNITY): Payer: Self-pay | Admitting: Cardiovascular Disease

## 2016-03-14 ENCOUNTER — Encounter (HOSPITAL_COMMUNITY)
Admission: RE | Disposition: A | Discharge status: Home / Self Care | Payer: Self-pay | Source: Ambulatory Visit | Attending: Cardiovascular Disease

## 2016-03-14 ENCOUNTER — Ambulatory Visit (HOSPITAL_COMMUNITY)
Admission: RE | Admit: 2016-03-14 | Discharge: 2016-03-15 | Disposition: A | Discharge status: Home / Self Care | Payer: BLUE CROSS/BLUE SHIELD | Source: Ambulatory Visit | Attending: Cardiovascular Disease | Admitting: Cardiovascular Disease

## 2016-03-14 DIAGNOSIS — Z794 Long term (current) use of insulin: Secondary | ICD-10-CM | POA: Insufficient documentation

## 2016-03-14 DIAGNOSIS — E1159 Type 2 diabetes mellitus with other circulatory complications: Secondary | ICD-10-CM | POA: Diagnosis present

## 2016-03-14 DIAGNOSIS — I2510 Atherosclerotic heart disease of native coronary artery without angina pectoris: Secondary | ICD-10-CM | POA: Diagnosis present

## 2016-03-14 DIAGNOSIS — I152 Hypertension secondary to endocrine disorders: Secondary | ICD-10-CM | POA: Diagnosis present

## 2016-03-14 DIAGNOSIS — Z8249 Family history of ischemic heart disease and other diseases of the circulatory system: Secondary | ICD-10-CM | POA: Diagnosis not present

## 2016-03-14 DIAGNOSIS — F1290 Cannabis use, unspecified, uncomplicated: Secondary | ICD-10-CM | POA: Diagnosis not present

## 2016-03-14 DIAGNOSIS — I252 Old myocardial infarction: Secondary | ICD-10-CM | POA: Insufficient documentation

## 2016-03-14 DIAGNOSIS — Z7982 Long term (current) use of aspirin: Secondary | ICD-10-CM | POA: Insufficient documentation

## 2016-03-14 DIAGNOSIS — E119 Type 2 diabetes mellitus without complications: Secondary | ICD-10-CM | POA: Diagnosis not present

## 2016-03-14 DIAGNOSIS — I208 Other forms of angina pectoris: Secondary | ICD-10-CM

## 2016-03-14 DIAGNOSIS — I447 Left bundle-branch block, unspecified: Secondary | ICD-10-CM | POA: Diagnosis not present

## 2016-03-14 DIAGNOSIS — I2089 Other forms of angina pectoris: Secondary | ICD-10-CM

## 2016-03-14 DIAGNOSIS — R0609 Other forms of dyspnea: Secondary | ICD-10-CM | POA: Diagnosis not present

## 2016-03-14 DIAGNOSIS — I10 Essential (primary) hypertension: Secondary | ICD-10-CM | POA: Diagnosis present

## 2016-03-14 DIAGNOSIS — G4733 Obstructive sleep apnea (adult) (pediatric): Secondary | ICD-10-CM | POA: Insufficient documentation

## 2016-03-14 DIAGNOSIS — Z7984 Long term (current) use of oral hypoglycemic drugs: Secondary | ICD-10-CM | POA: Diagnosis not present

## 2016-03-14 DIAGNOSIS — Z955 Presence of coronary angioplasty implant and graft: Secondary | ICD-10-CM | POA: Diagnosis not present

## 2016-03-14 DIAGNOSIS — I25118 Atherosclerotic heart disease of native coronary artery with other forms of angina pectoris: Secondary | ICD-10-CM | POA: Diagnosis not present

## 2016-03-14 DIAGNOSIS — I110 Hypertensive heart disease with heart failure: Secondary | ICD-10-CM | POA: Insufficient documentation

## 2016-03-14 DIAGNOSIS — I5042 Chronic combined systolic (congestive) and diastolic (congestive) heart failure: Secondary | ICD-10-CM | POA: Diagnosis not present

## 2016-03-14 HISTORY — DX: Dependence on other enabling machines and devices: Z99.89

## 2016-03-14 HISTORY — DX: Acute myocardial infarction, unspecified: I21.9

## 2016-03-14 HISTORY — DX: Type 2 diabetes mellitus without complications: E11.9

## 2016-03-14 HISTORY — PX: CARDIAC CATHETERIZATION: SHX172

## 2016-03-14 HISTORY — DX: Obstructive sleep apnea (adult) (pediatric): G47.33

## 2016-03-14 LAB — GLUCOSE, CAPILLARY
GLUCOSE-CAPILLARY: 244 mg/dL — AB (ref 65–99)
GLUCOSE-CAPILLARY: 332 mg/dL — AB (ref 65–99)
Glucose-Capillary: 238 mg/dL — ABNORMAL HIGH (ref 65–99)

## 2016-03-14 LAB — BASIC METABOLIC PANEL
Anion gap: 16 — ABNORMAL HIGH (ref 5–15)
BUN: 13 mg/dL (ref 6–20)
CALCIUM: 9.1 mg/dL (ref 8.9–10.3)
CHLORIDE: 93 mmol/L — AB (ref 101–111)
CO2: 32 mmol/L (ref 22–32)
Creatinine, Ser: 1.1 mg/dL — ABNORMAL HIGH (ref 0.44–1.00)
GFR calc non Af Amer: 58 mL/min — ABNORMAL LOW (ref >60)
Glucose, Bld: 240 mg/dL — ABNORMAL HIGH (ref 65–99)
Potassium: 3.3 mmol/L — ABNORMAL LOW (ref 3.5–5.1)
Sodium: 141 mmol/L (ref 135–145)

## 2016-03-14 LAB — POCT ACTIVATED CLOTTING TIME: ACTIVATED CLOTTING TIME: 641 s

## 2016-03-14 LAB — PREGNANCY, URINE: PREG TEST UR: NEGATIVE

## 2016-03-14 SURGERY — CORONARY STENT INTERVENTION

## 2016-03-14 MED ORDER — CARVEDILOL 12.5 MG PO TABS
12.5000 mg | ORAL_TABLET | Freq: Two times a day (BID) | ORAL | Status: DC
  Administered 2016-03-14 – 2016-03-15 (×2): 12.5 mg via ORAL
  Filled 2016-03-14 (×2): qty 1

## 2016-03-14 MED ORDER — HEPARIN (PORCINE) IN NACL 2-0.9 UNIT/ML-% IJ SOLN
INTRAMUSCULAR | Status: AC
  Filled 2016-03-14: qty 1000

## 2016-03-14 MED ORDER — NITROGLYCERIN 1 MG/10 ML FOR IR/CATH LAB
INTRA_ARTERIAL | Status: AC
  Filled 2016-03-14: qty 10

## 2016-03-14 MED ORDER — BIVALIRUDIN 250 MG IV SOLR
INTRAVENOUS | Status: AC
  Filled 2016-03-14: qty 250

## 2016-03-14 MED ORDER — ACETAMINOPHEN 325 MG PO TABS: 650.0000 mg | ORAL_TABLET | Freq: Four times a day (QID) | ORAL | Status: DC | PRN

## 2016-03-14 MED ORDER — SODIUM CHLORIDE 0.9% FLUSH
3.0000 mL | Freq: Two times a day (BID) | INTRAVENOUS | Status: DC
  Administered 2016-03-14: 21:00:00 3 mL via INTRAVENOUS

## 2016-03-14 MED ORDER — DOCUSATE SODIUM 100 MG PO CAPS
100.0000 mg | ORAL_CAPSULE | Freq: Two times a day (BID) | ORAL | Status: DC
  Administered 2016-03-14 – 2016-03-15 (×3): 100 mg via ORAL
  Filled 2016-03-14 (×3): qty 1

## 2016-03-14 MED ORDER — POTASSIUM CHLORIDE CRYS ER 20 MEQ PO TBCR
EXTENDED_RELEASE_TABLET | ORAL | Status: AC
  Filled 2016-03-14: qty 1

## 2016-03-14 MED ORDER — ANGIOPLASTY BOOK
Freq: Once | Status: AC
Start: 2016-03-14 — End: 2016-03-14
  Administered 2016-03-14: 23:00:00
  Filled 2016-03-14: qty 1

## 2016-03-14 MED ORDER — IOPAMIDOL (ISOVUE-370) INJECTION 76%
INTRAVENOUS | Status: DC | PRN
  Administered 2016-03-14: 100 mL

## 2016-03-14 MED ORDER — SODIUM CHLORIDE 0.9% FLUSH: 3.0000 mL | Freq: Two times a day (BID) | INTRAVENOUS | Status: DC

## 2016-03-14 MED ORDER — SODIUM CHLORIDE 0.9% FLUSH: 3.0000 mL | INTRAVENOUS | Status: DC | PRN

## 2016-03-14 MED ORDER — BIVALIRUDIN 250 MG IV SOLR
250.0000 mg | INTRAVENOUS | Status: DC | PRN
  Administered 2016-03-14: 1.75 mg/kg/h via INTRAVENOUS

## 2016-03-14 MED ORDER — FENTANYL CITRATE (PF) 100 MCG/2ML IJ SOLN
INTRAMUSCULAR | Status: DC | PRN
  Administered 2016-03-14 (×2): 25 ug via INTRAVENOUS

## 2016-03-14 MED ORDER — SODIUM CHLORIDE 0.9 % IV SOLN: INTRAVENOUS | Status: AC

## 2016-03-14 MED ORDER — MIDAZOLAM HCL 2 MG/2ML IJ SOLN
INTRAMUSCULAR | Status: AC
  Filled 2016-03-14: qty 2

## 2016-03-14 MED ORDER — ASPIRIN EC 81 MG PO TBEC
81.0000 mg | DELAYED_RELEASE_TABLET | Freq: Every day | ORAL | Status: DC
  Administered 2016-03-15: 81 mg via ORAL
  Filled 2016-03-14 (×2): qty 1

## 2016-03-14 MED ORDER — NITROGLYCERIN 1 MG/10 ML FOR IR/CATH LAB
INTRA_ARTERIAL | Status: DC | PRN
  Administered 2016-03-14 (×2): 200 ug via INTRACORONARY

## 2016-03-14 MED ORDER — INSULIN GLARGINE 100 UNIT/ML ~~LOC~~ SOLN
20.0000 [IU] | Freq: Every day | SUBCUTANEOUS | Status: DC
  Administered 2016-03-14: 21:00:00 20 [IU] via SUBCUTANEOUS
  Filled 2016-03-14 (×2): qty 0.2

## 2016-03-14 MED ORDER — POTASSIUM CHLORIDE CRYS ER 20 MEQ PO TBCR
20.0000 meq | EXTENDED_RELEASE_TABLET | Freq: Once | ORAL | Status: AC
  Administered 2016-03-14: 20 meq via ORAL

## 2016-03-14 MED ORDER — SODIUM CHLORIDE 0.9 % IV SOLN: 250.0000 mL | INTRAVENOUS | Status: DC | PRN

## 2016-03-14 MED ORDER — ASPIRIN 81 MG PO CHEW: 81.0000 mg | CHEWABLE_TABLET | ORAL | Status: DC

## 2016-03-14 MED ORDER — IOPAMIDOL (ISOVUE-370) INJECTION 76%
INTRAVENOUS | Status: AC
  Filled 2016-03-14: qty 125

## 2016-03-14 MED ORDER — TICAGRELOR 90 MG PO TABS
90.0000 mg | ORAL_TABLET | Freq: Two times a day (BID) | ORAL | Status: DC
  Administered 2016-03-14 – 2016-03-15 (×2): 90 mg via ORAL
  Filled 2016-03-14 (×2): qty 1

## 2016-03-14 MED ORDER — ALBUTEROL SULFATE (2.5 MG/3ML) 0.083% IN NEBU: 2.5000 mg | INHALATION_SOLUTION | Freq: Four times a day (QID) | RESPIRATORY_TRACT | Status: DC | PRN

## 2016-03-14 MED ORDER — MIDAZOLAM HCL 2 MG/2ML IJ SOLN
INTRAMUSCULAR | Status: DC | PRN
  Administered 2016-03-14 (×2): 1 mg via INTRAVENOUS

## 2016-03-14 MED ORDER — SENNOSIDES-DOCUSATE SODIUM 8.6-50 MG PO TABS
2.0000 | ORAL_TABLET | Freq: Every day | ORAL | Status: DC
  Administered 2016-03-14 – 2016-03-15 (×2): 2 via ORAL
  Filled 2016-03-14 (×2): qty 2

## 2016-03-14 MED ORDER — ACETAMINOPHEN 325 MG PO TABS: 650.0000 mg | ORAL_TABLET | ORAL | Status: DC | PRN

## 2016-03-14 MED ORDER — LIDOCAINE HCL (PF) 1 % IJ SOLN
INTRAMUSCULAR | Status: AC
  Filled 2016-03-14: qty 30

## 2016-03-14 MED ORDER — BIVALIRUDIN BOLUS VIA INFUSION - CUPID
INTRAVENOUS | Status: DC | PRN
  Administered 2016-03-14: 75.525 mg via INTRAVENOUS

## 2016-03-14 MED ORDER — FENTANYL CITRATE (PF) 100 MCG/2ML IJ SOLN
INTRAMUSCULAR | Status: AC
  Filled 2016-03-14: qty 2

## 2016-03-14 MED ORDER — LIDOCAINE HCL (PF) 1 % IJ SOLN
INTRAMUSCULAR | Status: DC | PRN
  Administered 2016-03-14: 18 mL via SUBCUTANEOUS

## 2016-03-14 MED ORDER — ATORVASTATIN CALCIUM 40 MG PO TABS
40.0000 mg | ORAL_TABLET | Freq: Every day | ORAL | Status: DC
  Administered 2016-03-14: 40 mg via ORAL
  Filled 2016-03-14: qty 1

## 2016-03-14 MED ORDER — POTASSIUM CHLORIDE CRYS ER 20 MEQ PO TBCR
20.0000 meq | EXTENDED_RELEASE_TABLET | Freq: Every day | ORAL | Status: DC
  Administered 2016-03-14 – 2016-03-15 (×2): 20 meq via ORAL
  Filled 2016-03-14 (×2): qty 1

## 2016-03-14 MED ORDER — INSULIN ASPART 100 UNIT/ML ~~LOC~~ SOLN
0.0000 [IU] | Freq: Three times a day (TID) | SUBCUTANEOUS | Status: DC
  Administered 2016-03-14: 19:00:00 11 [IU] via SUBCUTANEOUS
  Administered 2016-03-15: 5 [IU] via SUBCUTANEOUS

## 2016-03-14 MED ORDER — SODIUM CHLORIDE 0.9 % IV SOLN
INTRAVENOUS | Status: DC
  Administered 2016-03-14: 07:00:00 via INTRAVENOUS

## 2016-03-14 MED ORDER — AMLODIPINE BESYLATE 5 MG PO TABS
5.0000 mg | ORAL_TABLET | Freq: Every day | ORAL | Status: DC
  Administered 2016-03-14 – 2016-03-15 (×2): 5 mg via ORAL
  Filled 2016-03-14 (×2): qty 1

## 2016-03-14 MED ORDER — ONDANSETRON HCL 4 MG/2ML IJ SOLN: 4.0000 mg | Freq: Four times a day (QID) | INTRAMUSCULAR | Status: DC | PRN

## 2016-03-14 MED ORDER — PANTOPRAZOLE SODIUM 40 MG PO TBEC
40.0000 mg | DELAYED_RELEASE_TABLET | Freq: Every day | ORAL | Status: DC
  Administered 2016-03-14 – 2016-03-15 (×2): 40 mg via ORAL
  Filled 2016-03-14 (×2): qty 1

## 2016-03-14 MED ORDER — HEPARIN (PORCINE) IN NACL 2-0.9 UNIT/ML-% IJ SOLN
INTRAMUSCULAR | Status: DC | PRN
  Administered 2016-03-14: 10:00:00

## 2016-03-14 SURGICAL SUPPLY — 15 items
BALLN EUPHORA RX 2.5X12 (BALLOONS) ×3
BALLN ~~LOC~~ EUPHORA RX 3.25X12 (BALLOONS) ×3
BALLOON EUPHORA RX 2.5X12 (BALLOONS) IMPLANT
BALLOON ~~LOC~~ EUPHORA RX 3.25X12 (BALLOONS) IMPLANT
GUIDE CATH RUNWAY 6FR AL 75 (CATHETERS) ×2 IMPLANT
KIT ENCORE 26 ADVANTAGE (KITS) ×3 IMPLANT
KIT HEART LEFT (KITS) ×3 IMPLANT
KIT MICROINTRODUCER STIFF 5F (SHEATH) ×2 IMPLANT
PACK CARDIAC CATHETERIZATION (CUSTOM PROCEDURE TRAY) ×3 IMPLANT
SHEATH PINNACLE 6F 10CM (SHEATH) ×2 IMPLANT
STENT XIENCE ALPINE RX 3.0X15 (Permanent Stent) ×2 IMPLANT
STOPCOCK MORSE 400PSI 3WAY (MISCELLANEOUS) ×2 IMPLANT
TRANSDUCER W/STOPCOCK (MISCELLANEOUS) ×3 IMPLANT
TUBING CIL FLEX 10 FLL-RA (TUBING) ×3 IMPLANT
WIRE RUNTHROUGH .014X180CM (WIRE) ×2 IMPLANT

## 2016-03-14 NOTE — Interval H&P Note (Signed)
Cath Lab Visit (complete for each Cath Lab visit)  Clinical Evaluation Leading to the Procedure:   ACS: No.  Non-ACS:    Anginal Classification: CCS III  Anti-ischemic medical therapy: Maximal Therapy (2 or more classes of medications)  Non-Invasive Test Results: No non-invasive testing performed  Prior CABG: No previous CABG      History and Physical Interval Note:  03/14/2016 8:39 AM  [name] Course  has presented today for surgery, with the diagnosis of cad  The various methods of treatment have been discussed with the patient and family. After consideration of risks, benefits and other options for treatment, the patient has consented to  Procedure(s): Coronary Stent Intervention (N/A) as a surgical intervention .  The patient's history has been reviewed, patient examined, no change in status, stable for surgery.  I have reviewed the patient's chart and labs.  Questions were answered to the patient's satisfaction.     Lorine Bears

## 2016-03-14 NOTE — H&P (View-Only) (Signed)
Cardiology Office Note   Date:  03/07/2016   ID:  [name], DOB 05/05/1966, MRN [id number]  PCP:  Lupita Dawn, MD  Cardiologist:   [name], MD   Chief Complaint  Patient presents with  . Hospitalization Follow-up    s/p pci - patient reports no cardiac complaints. she reports a fall when she got home from the hospital and complains leg pain.      History of Present Illness: [name] is a 50 y.o. female who presents for  A follow-up visit regarding coronary artery disease and chronic systolic heart failure. She has known history of hypertension, type 2 diabetes and obesity. She was hospitalized on April 14 at [facility] with respiratory failure due to acute systolic heart failure with non-ST elevation myocardial infarction. Peak troponin was 7. Echocardiogram showed an ejection fraction of 40-45% with apical and inferoapical akinesis. There was a questionable mass on the mitral valve which was ruled out by TEE. She underwent cardiac catheterization during her hospitalization which showed severe two-vessel coronary artery disease with 99% mid to distal LAD stenosis, 99% ostial first diagonal stenosis and 85% mid RCA stenosis. She underwent successful balloon angioplasty of the first diagonal and drug-eluting stent placement to the mid to distal LAD. She developed left bundle branch block when the left ventricle was engaged and this persisted throughout her hospitalization. She was subsequently discharged home but returned with respiratory distress and stridor which was found to be due to subglottic edema. She was reintubated. This was felt to be due to her recent intubation and possibly due to ACE inhibitor use. She was discharged home on prednisone. She was told about obstructive sleep apnea during her hospitalization and was treated with CPAP.  Since hospital discharge, she has been doing reasonably well with no chest pain. She continues to have exertional dyspnea with  moderate activities. She fell recently with some confusion in her left knee. She is still able to walk.  She works at [employer] and has been off work since her initial hospitalization.   Past Medical History  Diagnosis Date  . Diabetes mellitus   . Hypertension     Does not see a cardiologist  . Sleep apnea     Uses CPAP  . Flash pulmonary edema ([place]) 02/10/2016    requiring intubation  . CAD (coronary artery disease) 02/10/2016    a. NSTEMI 02/2016: DES to mid-LAD, POBA to 1st Diag, 85% stenosis of RCA noted with staged PCI recommended  . ARF (acute respiratory failure) ([place]) 02/10/2016  . Chronic combined systolic (congestive) and diastolic (congestive) heart failure ([place])     a. Echo 02/2016: EF 40-45% w/ Grade 2 DD. 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    Past Surgical History  Procedure Laterality Date  . Breast surgery      cyst right breast  . Anterior cervical decomp/discectomy fusion  08/28/2012    Procedure: ANTERIOR CERVICAL DECOMPRESSION/DISCECTOMY FUSION 2 LEVELS;  Surgeon: Otilio Connors, MD;  Location: [place] [facility];  Service: Neurosurgery;  Laterality: N/A;  Cervical four-five, cervical six-seven Anterior cervical decompression/diskectomy/fusion/LifeNet Bone/Trestle plate  . Cardiac catheterization N/A 02/15/2016    Procedure: Left Heart Cath and Coronary Angiography;  Surgeon: Wellington Hampshire, MD;  Location: [place] [facility];  Service: Cardiovascular;  Laterality: N/A;  . Cardiac catheterization N/A 02/15/2016    Procedure: Coronary Stent Intervention;  Surgeon: Wellington Hampshire, MD;  Location: [place] [facility];  Service: Cardiovascular;  Laterality: N/A;  . Cardiac catheterization N/A 02/15/2016    Procedure: Coronary Balloon Angioplasty;  Surgeon: Wellington Hampshire, MD;  Location: [place] [facility];  Service: Cardiovascular;  Laterality: N/A;  . Coronary angioplasty with stent placement       Current Outpatient Prescriptions    Medication Sig Dispense Refill  . acetaminophen (TYLENOL) 325 MG tablet Take 2 tablets (650 mg total) by mouth every 6 (six) hours as needed for mild pain (or Fever >/= 101). 60 tablet 0  . albuterol (PROVENTIL HFA;VENTOLIN HFA) 108 (90 Base) MCG/ACT inhaler Inhale 2 puffs into the lungs every 6 (six) hours as needed for wheezing or shortness of breath. 1 Inhaler 0  . amLODipine (NORVASC) 5 MG tablet Take 1 tablet (5 mg total) by mouth daily. 30 tablet 0  . aspirin EC 81 MG EC tablet Take 1 tablet (81 mg total) by mouth daily. 30 tablet 0  . atorvastatin (LIPITOR) 40 MG tablet Take 1 tablet (40 mg total) by mouth daily at 6 PM. 90 tablet 0  . blood glucose meter kit and supplies KIT Dispense based on patient and insurance preference. Use up to four times daily as directed. (FOR ICD-9 250.00, 250.01). 1 each 0  . carvedilol (COREG) 12.5 MG tablet Take 1 tablet (12.5 mg total) by mouth 2 (two) times daily with a meal. 60 tablet 0  . insulin glargine (LANTUS) 100 UNIT/ML injection Inject 0.2 mLs (20 Units total) into the skin at bedtime. 10 mL 0  . metFORMIN (GLUCOPHAGE) 500 MG tablet Take 1 tablet (500 mg total) by mouth 2 (two) times daily with a meal. 60 tablet 0  . omeprazole (PRILOSEC) 40 MG capsule Take 1 capsule (40 mg total) by mouth daily. 30 capsule 3  . ticagrelor (BRILINTA) 90 MG TABS tablet Take 1 tablet (90 mg total) by mouth 2 (two) times daily. 60 tablet 0  . polyethylene glycol powder (GLYCOLAX/MIRALAX) powder Take 17 g by mouth 2 (two) times daily as needed. 3350 g 1  . senna-docusate (SENOKOT-S) 8.6-50 MG tablet Take 2 tablets by mouth daily. As needed for constipation. 60 tablet 1   No current facility-administered medications for this visit.    Allergies:   Ace inhibitors    Social History:  The patient  reports that she has never smoked. She has never used smokeless tobacco. She reports that she drinks alcohol. She reports that she uses illicit drugs (Marijuana).   Family  History:  The patient's family history includes Diabetes in her paternal grandmother; Heart disease in her paternal aunt; Heart disease (age of onset: 14) in her paternal grandmother; Heart disease (age of onset: 35) in her father; Hypertension in her paternal grandmother. There is no history of Cancer, Stroke, or Alcohol abuse.    ROS:  Please see the history of present illness.   Otherwise, review of systems are positive for none.   All other systems are reviewed and negative.    PHYSICAL EXAM: VS:  BP 138/64 mmHg  Pulse 75  Ht 5' 2.5" (1.588 m)  Wt 222 lb 3.2 oz (100.789 kg)  BMI 39.97 kg/m2  LMP 01/19/2016 , BMI Body mass index is 39.97 kg/(m^2). GEN: Well nourished, well developed, in no acute distress HEENT: normal Neck: no JVD, carotid bruits, or masses Cardiac: RRR; no murmurs, rubs, or gallops,no edema  Respiratory:  clear to auscultation bilaterally, normal work of breathing GI: soft, nontender, nondistended, + BS MS: no deformity or atrophy Skin: warm  and dry, no rash Neuro:  Strength and sensation are intact Psych: euthymic mood, full affect   EKG:  EKG is ordered today. The ekg ordered today demonstrates normal sinus rhythm, left ventricular hypertrophy with repolarization abnormalities.   Recent Labs: 02/10/2016: B Natriuretic Peptide 227.6* 02/25/2016: Magnesium 2.1 03/01/2016: ALT 39* 03/06/2016: BUN 17; Creat 0.87; Hemoglobin 10.9*; Platelets 192; Potassium 3.0*; Sodium 139    Lipid Panel    Component Value Date/Time   CHOL 169 11/17/2013 0935   TRIG 749* 02/10/2016 2355   HDL 48 11/17/2013 0935   CHOLHDL 3.5 11/17/2013 0935   VLDL 22 11/17/2013 0935   LDLCALC 99 11/17/2013 0935      Wt Readings from Last 3 Encounters:  03/06/16 222 lb 3.2 oz (100.789 kg)  03/01/16 217 lb (98.431 kg)  02/25/16 222 lb 11.2 oz (101.016 kg)       ASSESSMENT AND PLAN:  1.  Coronary artery disease involving native coronary arteries with other forms of angina: Patient  continues to have exertional dyspnea without chest pain consistent with class II angina in spite of to anti-angina medications. She is known to have residual severe stenosis in the midright coronary artery from her recent cardiac catheterization. I recommend proceeding with staged RCA PCI. I discussed risks and benefits with the patient. She is already on dual antiplatelet therapy. Plan arterial access is either via the left radial artery or the right common femoral artery given that catheterization was not possible via the right radial artery.  2. Chronic systolic heart failure: Ejection fraction was 40-45%. She appears to be euvolemic without diuretics. Avoid ACE inhibitor due to possible angioedema.  3. Obstructive sleep apnea: The patient could not get appointment with pulmonary until the end of July. I think it is critical to treat her sleep apnea in order to avoid hospitalizations for heart failure. I'm referring her to Dr. Radford Pax.    Disposition:   FU with me in 1 month  Signed,  [name], MD  03/07/2016 2:42 PM    [facility]

## 2016-03-14 NOTE — Telephone Encounter (Signed)
Spoke with patient, she states she also needs an excuse for her daughter Susej Specker for the same dates since she is who will be taking care of her. This is ok per Dr. Randolm Idol. Letters printed and placed up front, patient daughter informed.

## 2016-03-14 NOTE — Telephone Encounter (Signed)
Pt is calling back to check on the status of her letter. She said that the dates can be from 03/15/16 through 03/20/16. She would like to pick this up in the morning since her surgery is tomorrow. jw

## 2016-03-14 NOTE — Research (Signed)
Twilight Study Informed Consent   Subject Name: [name]  Subject met inclusion and exclusion criteria.  The informed consent form, study requirements and expectations were reviewed with the subject and questions and concerns were addressed prior to the signing of the consent form.  The subject verbalized understanding of the trail requirements.  The subject agreed to participate in the Twilght trial and signed the informed consent.  The informed consent was obtained prior to performance of any protocol-specific procedures for the subject.  A copy of the signed informed consent was given to the subject and a copy was placed in the subject's medical record.  Sandie Ano 03/14/2016, 4:00

## 2016-03-15 ENCOUNTER — Other Ambulatory Visit: Payer: Self-pay | Admitting: *Deleted

## 2016-03-15 ENCOUNTER — Encounter (HOSPITAL_COMMUNITY): Payer: Self-pay | Admitting: Cardiology

## 2016-03-15 ENCOUNTER — Ambulatory Visit: Payer: BLUE CROSS/BLUE SHIELD | Admitting: Family Medicine

## 2016-03-15 ENCOUNTER — Telehealth: Payer: Self-pay | Admitting: Cardiovascular Disease

## 2016-03-15 DIAGNOSIS — I2510 Atherosclerotic heart disease of native coronary artery without angina pectoris: Secondary | ICD-10-CM

## 2016-03-15 DIAGNOSIS — G4733 Obstructive sleep apnea (adult) (pediatric): Secondary | ICD-10-CM | POA: Diagnosis not present

## 2016-03-15 DIAGNOSIS — I10 Essential (primary) hypertension: Secondary | ICD-10-CM | POA: Diagnosis not present

## 2016-03-15 DIAGNOSIS — E119 Type 2 diabetes mellitus without complications: Secondary | ICD-10-CM | POA: Diagnosis not present

## 2016-03-15 DIAGNOSIS — I252 Old myocardial infarction: Secondary | ICD-10-CM | POA: Diagnosis not present

## 2016-03-15 LAB — BASIC METABOLIC PANEL
Anion gap: 12 (ref 5–15)
BUN: 14 mg/dL (ref 6–20)
CALCIUM: 9.2 mg/dL (ref 8.9–10.3)
CO2: 27 mmol/L (ref 22–32)
CREATININE: 0.92 mg/dL (ref 0.44–1.00)
Chloride: 99 mmol/L — ABNORMAL LOW (ref 101–111)
GFR calc Af Amer: >60 mL/min (ref >60)
Glucose, Bld: 271 mg/dL — ABNORMAL HIGH (ref 65–99)
POTASSIUM: 3.3 mmol/L — AB (ref 3.5–5.1)
SODIUM: 138 mmol/L (ref 135–145)

## 2016-03-15 LAB — CBC
HEMATOCRIT: 29.8 % — AB (ref 36.0–46.0)
Hemoglobin: 8.9 g/dL — ABNORMAL LOW (ref 12.0–15.0)
MCH: 24.5 pg — ABNORMAL LOW (ref 26.0–34.0)
MCHC: 29.9 g/dL — ABNORMAL LOW (ref 30.0–36.0)
MCV: 81.9 fL (ref 78.0–100.0)
PLATELETS: 162 10*3/uL (ref 150–400)
RBC: 3.64 MIL/uL — ABNORMAL LOW (ref 3.87–5.11)
RDW: 20.2 % — AB (ref 11.5–15.5)
WBC: 7.2 10*3/uL (ref 4.0–10.5)

## 2016-03-15 LAB — GLUCOSE, CAPILLARY: Glucose-Capillary: 250 mg/dL — ABNORMAL HIGH (ref 65–99)

## 2016-03-15 MED ORDER — AMBULATORY NON FORMULARY MEDICATION: 81.0000 mg | Freq: Every day | Status: DC

## 2016-03-15 MED ORDER — AMBULATORY NON FORMULARY MEDICATION: 90.0000 mg | Freq: Two times a day (BID) | Status: DC

## 2016-03-15 NOTE — Progress Notes (Signed)
Subjective: Pt feels good  No CP  No SB   Objective: Filed Vitals:   03/14/16 1600 03/14/16 1700 03/14/16 2021 03/15/16 0505  BP: 120/47 125/46 122/47 130/40  Pulse: 82 78 84 73  Temp: 98 F (36.7 C)  99.6 F (37.6 C) 99.2 F (37.3 C)  TempSrc: Oral  Oral Oral  Resp: 16 17 26 20   Height:      Weight:    222 lb 10.6 oz (101 kg)  SpO2: 96% 97% 96% 94%   Weight change: 10.6 oz (0.301 kg)  Intake/Output Summary (Last 24 hours) at 03/15/16 0800 Last data filed at 03/14/16 1800  Gross per 24 hour  Intake    600 ml  Output      0 ml  Net    600 ml    General: Alert, awake, oriented x3, in no acute distress Neck:  JVP is normal Heart: Regular rate and rhythm, without murmurs, rubs, gallops.  Lungs: Clear to auscultation.  No rales or wheezes. Exemities:  No edema.  R groin without hematoma or bruit   Neuro: Grossly intact, nonfocal.  Tele  SR    Lab Results: Results for orders placed or performed during the hospital encounter of 03/14/16 (from the past 24 hour(s))  POCT Activated clotting time     Status: None   Collection Time: 03/14/16  9:07 AM  Result Value Ref Range   Activated Clotting Time 641 seconds  Glucose, capillary     Status: Abnormal   Collection Time: 03/14/16  6:02 PM  Result Value Ref Range   Glucose-Capillary 332 (H) 65 - 99 mg/dL  Glucose, capillary     Status: Abnormal   Collection Time: 03/14/16  9:35 PM  Result Value Ref Range   Glucose-Capillary 238 (H) 65 - 99 mg/dL   Comment 1 Notify RN    Comment 2 Document in Chart   Basic metabolic panel     Status: Abnormal   Collection Time: 03/15/16  5:00 AM  Result Value Ref Range   Sodium 138 135 - 145 mmol/L   Potassium 3.3 (L) 3.5 - 5.1 mmol/L   Chloride 99 (L) 101 - 111 mmol/L   CO2 27 22 - 32 mmol/L   Glucose, Bld 271 (H) 65 - 99 mg/dL   BUN 14 6 - 20 mg/dL   Creatinine, Ser 9.17 0.44 - 1.00 mg/dL   Calcium 9.2 8.9 - 91.5 mg/dL   GFR calc non Af Amer >60 >60 mL/min   GFR calc Af Amer >60  >60 mL/min   Anion gap 12 5 - 15  CBC     Status: Abnormal   Collection Time: 03/15/16  5:00 AM  Result Value Ref Range   WBC 7.2 4.0 - 10.5 K/uL   RBC 3.64 (L) 3.87 - 5.11 MIL/uL   Hemoglobin 8.9 (L) 12.0 - 15.0 g/dL   HCT 05.6 (L) 97.9 - 48.0 %   MCV 81.9 78.0 - 100.0 fL   MCH 24.5 (L) 26.0 - 34.0 pg   MCHC 29.9 (L) 30.0 - 36.0 g/dL   RDW 16.5 (H) 53.7 - 48.2 %   Platelets 162 150 - 400 K/uL  Glucose, capillary     Status: Abnormal   Collection Time: 03/15/16  6:17 AM  Result Value Ref Range   Glucose-Capillary 250 (H) 65 - 99 mg/dL   Comment 1 Notify RN    Comment 2 Document in Chart     Studies/Results: No results found.  Medications:   @  PROBHOSP@  1  CAD  Pt underent PTCA/DES to mid RCA  (90% to 0%) REcomm DAPT for at least 1 year   Resume work in 2 wks      2  HTN    Good control    3  HL  COntinue on statin    D/C today    Dietrich Pates 03/15/2016, 8:00 AM

## 2016-03-15 NOTE — Telephone Encounter (Signed)
7 day TOC fu appt-Per Lillia Abed NP [facility]-appt [facility] 03-22-16 11a

## 2016-03-15 NOTE — Progress Notes (Signed)
Patient Name: [name] Date of Encounter: 03/15/2016  Hospital Problem List     Active Problems:   CAD, multiple vessel   Effort angina [facility])   Essential hypertension   Diabetes mellitus, type 2 (HCC)    Subjective   States she feels well this morning. Ambulated with cardiac rehab with no complaints.   Inpatient Medications    . amLODipine  5 mg Oral Daily  . aspirin EC  81 mg Oral Daily  . atorvastatin  40 mg Oral q1800  . carvedilol  12.5 mg Oral BID WC  . docusate sodium  100 mg Oral Q12H  . insulin aspart  0-15 Units Subcutaneous TID WC  . insulin glargine  20 Units Subcutaneous QHS  . pantoprazole  40 mg Oral Daily  . potassium chloride SA  20 mEq Oral Daily  . senna-docusate  2 tablet Oral Daily  . sodium chloride flush  3 mL Intravenous Q12H  . ticagrelor  90 mg Oral BID    Vital Signs    Filed Vitals:   03/14/16 2021 03/15/16 0505 03/15/16 0809 03/15/16 0814  BP: 122/47 130/40 164/104 105/36  Pulse: 84 73 90   Temp: 99.6 F (37.6 C) 99.2 F (37.3 C)    TempSrc: Oral Oral Oral   Resp: 26 20 18    Height:      Weight:  222 lb 10.6 oz (101 kg)    SpO2: 96% 94% 95%     Intake/Output Summary (Last 24 hours) at 03/15/16 0828 Last data filed at 03/14/16 1800  Gross per 24 hour  Intake    600 ml  Output      0 ml  Net    600 ml   Filed Weights   03/14/16 0622 03/15/16 0505  Weight: 222 lb (100.699 kg) 222 lb 10.6 oz (101 kg)    Physical Exam    General: Pleasant, NAD. Neuro: Alert and oriented X 3. Moves all extremities spontaneously. Psych: Normal affect. HEENT:  Normal  Neck: Supple without bruits or JVD. Lungs:  Resp regular and unlabored, CTA. Heart: RRR no s3, s4, or murmurs. Abdomen: Soft, non-tender, non-distended, BS + x 4.  Extremities: No clubbing, cyanosis or edema. DP/PT/Radials 2+ and equal bilaterally. Right groin site is without bruising or hematoma.   Labs    CBC  Recent Labs  03/15/16 0500  WBC 7.2  HGB 8.9*  HCT  29.8*  MCV 81.9  PLT 162   Basic Metabolic Panel  Recent Labs  03/14/16 0648 03/15/16 0500  NA 141 138  K 3.3* 3.3*  CL 93* 99*  CO2 32 27  GLUCOSE 240* 271*  BUN 13 14  CREATININE 1.10* 0.92  CALCIUM 9.1 9.2    Telemetry    SR Rate-70-80s. Occasional PVCs  ECG    SR rate-73 T wave abnormalities noted to be on previous EKG, no acute changes.  Radiology      Assessment & Plan    1. CAD: Pt presented yesterday for outpatient LHC, staged PCI with Dr. Kirke Corin for from NSTEMI on 02/2016 and symptoms of exertional dyspnea noted at her last office visit on 03/06/2016. She has a previously placed DES to the mid-dist LAD, along with a stent (unknown type) to the Ost 1 diag to 1st diag lesion. During the [facility] 03/14/2016 a DES was placed to a 90% stenosed RCA with 0% residual stenosis. Does continue to have 50% diffuse diease to the distal RCA, and 70% to the right  PDA.  -Recommendations for dual antiplatelet therapy for at least one year.  -Morning labs: Cr 0.92, Hgb 8.9 -Stable right groin cath site. Ambulated with cardiac rehab this morning without any complaints of dyspnea or chest pain.   2. HTN: Stable with current medications.   3. DM II: On SSI  Signed, Laverda Page NP-C Pager [phone]  Pt seen and examined  I agere with findings as noted by L Su Hilt above  Lungs CTA  Card RRR  No S3  No signif murmur  Ext  R groin without hematoma  Pt doing well post intervention  From a cardiac standpoint ok to be dced home     Dietrich Pates

## 2016-03-15 NOTE — Progress Notes (Signed)
CARDIAC REHAB PHASE I   PRE:  Rate/Rhythm: 73 SR  BP:  Supine: 139/46  Sitting:   Standing:    SaO2: 95%RA  MODE:  Ambulation: 500 ft   POST:  Rate/Rhythm: 91 SR  BP:  Supine:   Sitting: 105/36  Standing:    SaO2: 99%RA 0750-0835 Pt looks so much better than when I saw after MI in April. Pt walked 500 ft with steady gait independently. No CP or wheezing. Reviewed NTG use, ex ed, CRP 2 and purpose of brilinta. Pt is so excited that she qualifies for the study to get brilinta free. Pt stated she has been working on her walking. Will refer to [place] Phase 2. Pt had no questions re other ed done previously.   Luetta Nutting, RN BSN  03/15/2016 8:30 AM

## 2016-03-15 NOTE — Discharge Summary (Signed)
Discharge Summary    Patient ID: [name],  MRN: [id number], DOB/AGE: [birth year] 50 y.o.  Admit date: 03/14/2016 Discharge date: 03/15/2016  Primary Care Provider: Dossie Arbour, J Primary Cardiologist: Dr. Fletcher Anon  Discharge Diagnoses    Active Problems:   CAD, multiple vessel   Effort angina [facility])   Essential hypertension   Diabetes mellitus, type 2 (HCC)   Allergies Allergies  Allergen Reactions  . Ace Inhibitors Swelling    Diagnostic Studies/Procedures    Conclusion     Dist RCA lesion, 50% stenosed.  Ost RPDA to RPDA lesion, 70% stenosed.  Prox RCA lesion, 40% stenosed.  Mid RCA lesion, 90% stenosed. Post intervention, there is a 0% residual stenosis.  successful angioplasty and drug-eluting stent placement to the midright coronary artery. The distal RCA has 50% diffuse disease into the right PDA which has 70% stenosis. These were left to be treated medically.  The left coronary system was not imaged.   Recommendations: Dual antiplatelet therapy for at least one year. Aggressive treatment of risk factors. The patient can resume work in 2 weeks.     _____________   History of Present Illness     50 yo female with PMH of HTN/CHF/DM II/OSA/NSTEMI with staged PCI to DES to mid-LAD, POBA to 1st Diag, who was admitted on 03/14/2016 for intervention to Mid RCA by Dr. Fletcher Anon.    Hospital Course     Ms. Health is a 50 yo female patient of Dr. Tyrell Antonio with PMH of HTN/CHF/DM II/OSA/NSTEMI with staged PCI to DES to mid-LAD, POBA to 1st Diag and known 90% stenosis of the mid RCA. She was admitted back in 02/2016 with acute CHF and underwent cath. Noted to have severe 2-vessel disease involving the mid-lateral LAD 99% stenosis, along with mid RCA 85% stenosis. During this admission, Dr. Fletcher Anon placed a DES to the Mid LAD with balloon angioplasty to the 1st diag, with plans for staged PCI in 3-4 weeks for the mid RCA. She was seen back in the office on 03/06/2016 and  plans were made for [facility] on 03/14/2016.  She presented for the [facility] 03/14/2016, and had a DES placed to the Mid RCA lesion with 0% residual post intervention. Recommendations for dual antiplatelet therapy for at least a year. Access was obtained through the right groin, with no noted complications noted post cath. Noted to remain in SR overnight without any arrhythmias.   On 03/15/2016 she was ambulated with cardiac rehab without any complaints of chest pain/dyspnea. Morning labs showed a stable Cr, with slight decrease in Hgb. Right groin cath site is stable without bleeding. Vital signs stable.  She was seen and assessed by Dr. Harrington Challenger and considered stable for discharge. I have arranged follow up for the patient.   _____________  Discharge Vitals Blood pressure 105/36, pulse 90, temperature 99.2 F (37.3 C), temperature source Oral, resp. rate 18, height 5' 3"  (1.6 m), weight 222 lb 10.6 oz (101 kg), last menstrual period 02/19/2016, SpO2 95 %.  Filed Weights   03/14/16 0622 03/15/16 0505  Weight: 222 lb (100.699 kg) 222 lb 10.6 oz (101 kg)    Labs & Radiologic Studies    CBC  Recent Labs  03/15/16 0500  WBC 7.2  HGB 8.9*  HCT 29.8*  MCV 81.9  PLT 638   Basic Metabolic Panel  Recent Labs  03/14/16 0648 03/15/16 0500  NA 141 138  K 3.3* 3.3*  CL 93* 99*  CO2 32 27  GLUCOSE 240* 271*  BUN 13 14  CREATININE 1.10* 0.92  CALCIUM 9.1 9.2   _____________    Disposition   Pt is being discharged home today in good condition.  Follow-up Plans & Appointments    Follow-up Information    Follow up with Richardson Dopp, PA-C On 03/22/2016.   Specialties:  Cardiology, Physician Assistant   Why:  11am with Dr. Tyrell Antonio PA for your hospital follow up.    Contact information:   [address] [place] [address] [phone]      Discharge Instructions    AMB Referral to Cardiac Rehabilitation - Phase II    Complete by:  As directed   Diagnosis:  PTCA      Amb Referral to Cardiac Rehabilitation    Complete by:  As directed   Diagnosis:  Coronary Stents     Diet - low sodium heart healthy    Complete by:  As directed      Increase activity slowly    Complete by:  As directed            Discharge Medications   Current Discharge Medication List    CONTINUE these medications which have NOT CHANGED   Details  amLODipine (NORVASC) 5 MG tablet Take 1 tablet (5 mg total) by mouth daily. Qty: 30 tablet, Refills: 0    aspirin EC 81 MG EC tablet Take 1 tablet (81 mg total) by mouth daily. Qty: 30 tablet, Refills: 0    atorvastatin (LIPITOR) 40 MG tablet Take 1 tablet (40 mg total) by mouth daily at 6 PM. Qty: 90 tablet, Refills: 0    carvedilol (COREG) 12.5 MG tablet Take 1 tablet (12.5 mg total) by mouth 2 (two) times daily with a meal. Qty: 60 tablet, Refills: 0    docusate sodium (COLACE) 100 MG capsule Take 1 capsule (100 mg total) by mouth every 12 (twelve) hours. Qty: 30 capsule, Refills: 0    insulin glargine (LANTUS) 100 UNIT/ML injection Inject 0.2 mLs (20 Units total) into the skin at bedtime. Qty: 10 mL, Refills: 0    metFORMIN (GLUCOPHAGE) 500 MG tablet Take 1 tablet (500 mg total) by mouth 2 (two) times daily with a meal. Qty: 60 tablet, Refills: 0    omeprazole (PRILOSEC) 40 MG capsule Take 1 capsule (40 mg total) by mouth daily. Qty: 30 capsule, Refills: 3    polyethylene glycol powder (GLYCOLAX/MIRALAX) powder Take 17 g by mouth 2 (two) times daily as needed. Qty: 3350 g, Refills: 1   Associated Diagnoses: Constipation, unspecified constipation type    potassium chloride SA (K-DUR,KLOR-CON) 20 MEQ tablet Take 1 tablet (20 mEq total) by mouth daily. Qty: 30 tablet, Refills: 0    ticagrelor (BRILINTA) 90 MG TABS tablet Take 1 tablet (90 mg total) by mouth 2 (two) times daily. Qty: 60 tablet, Refills: 0    acetaminophen (TYLENOL) 325 MG tablet Take 2 tablets (650 mg total) by mouth every 6 (six) hours as needed for  mild pain (or Fever >/= 101). Qty: 60 tablet, Refills: 0    albuterol (PROVENTIL HFA;VENTOLIN HFA) 108 (90 Base) MCG/ACT inhaler Inhale 2 puffs into the lungs every 6 (six) hours as needed for wheezing or shortness of breath. Qty: 1 Inhaler, Refills: 0    blood glucose meter kit and supplies KIT Dispense based on patient and insurance preference. Use up to four times daily as directed. (FOR ICD-9 250.00, 250.01). Qty: 1 each, Refills: 0    senna-docusate (  SENOKOT-S) 8.6-50 MG tablet Take 2 tablets by mouth daily. As needed for constipation. Qty: 60 tablet, Refills: 1   Associated Diagnoses: Constipation, unspecified constipation type         Aspirin prescribed at discharge?  Yes High Intensity Statin Prescribed? (Lipitor 40-60m or Crestor 20-454m: Yes Beta Blocker Prescribed? Yes For EF <40%, was ACEI/ARB Prescribed? No: allergy ADP Receptor Inhibitor Prescribed? (i.e. Plavix etc.-Includes Medically Managed Patients): Yes For EF <40%, Aldosterone Inhibitor Prescribed? No: Was EF assessed during THIS hospitalization? No:  Was Cardiac Rehab II ordered? (Included Medically managed Patients): Yes   Outstanding Labs/Studies   None  Duration of Discharge Encounter   Greater than 30 minutes including physician time.  Signed, LiReino BellisP-C 03/15/2016, 9:00 AM

## 2016-03-15 NOTE — Discharge Instructions (Signed)
Groin Site Care Refer to this sheet in the next few weeks. These instructions provide you with information on caring for yourself after your procedure. Your caregiver may also give you more specific instructions. Your treatment has been planned according to current medical practices, but problems sometimes occur. Call your caregiver if you have any problems or questions after your procedure. HOME CARE INSTRUCTIONS  You may shower 24 hours after the procedure. Remove the bandage (dressing) and gently wash the site with plain soap and water. Gently pat the site dry.   Do not apply powder or lotion to the site.   Do not sit in a bathtub, swimming pool, or whirlpool for 5 to 7 days.   No bending, squatting, or lifting anything over 10 pounds (4.5 kg) as directed by your caregiver.   Inspect the site at least twice daily.   Do not drive home if you are discharged the same day of the procedure. Have someone else drive you.   You may drive 24 hours after the procedure unless otherwise instructed by your caregiver.  What to expect:  Any bruising will usually fade within 1 to 2 weeks.   Blood that collects in the tissue (hematoma) may be painful to the touch. It should usually decrease in size and tenderness within 1 to 2 weeks.  SEEK IMMEDIATE MEDICAL CARE IF:  You have unusual pain at the groin site or down the affected leg.   You have redness, warmth, swelling, or pain at the groin site.   You have drainage (other than a small amount of blood on the dressing).   You have chills.   You have a fever or persistent symptoms for more than 72 hours.   You have a fever and your symptoms suddenly get worse.   Your leg becomes pale, cool, tingly, or numb.  You have heavy bleeding from the site. Hold pressure on the site. .  May return to work in 2 weeks.

## 2016-03-15 NOTE — Progress Notes (Signed)
Pt. seen per CPAP order, refused @ this time, RT to monitor.

## 2016-03-16 NOTE — Telephone Encounter (Signed)
Lm to cb.

## 2016-03-19 NOTE — Telephone Encounter (Signed)
Follow up      Pt had a stent put in on 5-10.  Need note to be out of work for 2 weeks.  Pt want to pick up the note today

## 2016-03-19 NOTE — Telephone Encounter (Signed)
Please type a letter stating that the patient can resume work on 03/28/2016 without restrictions.

## 2016-03-19 NOTE — Telephone Encounter (Signed)
Patient needs a week note for 2 weeks for being out of work. Patient stated she needs this note today. Will forward to Dr. Kirke Corin and his nurse.

## 2016-03-19 NOTE — Telephone Encounter (Signed)
Letter complete and pt aware. The pt will pick-up letter at the [facility] location.

## 2016-03-21 ENCOUNTER — Other Ambulatory Visit: Payer: Self-pay | Admitting: *Deleted

## 2016-03-21 MED ORDER — AMLODIPINE BESYLATE 10 MG PO TABS: 10.0000 mg | ORAL_TABLET | Freq: Every day | ORAL | Status: DC

## 2016-03-21 NOTE — Telephone Encounter (Signed)
Patient reported taking Norvasc 10 mg daily; ordered in Norvasc 5 mg daily.   Clovis Pu, RN

## 2016-03-21 NOTE — Progress Notes (Signed)
Cardiology Office Note:    Date:  03/22/2016   ID:  [name], DOB 09/16/1966, MRN [id number]  PCP:  Lupita Dawn, MD  Cardiologist:  Dr. [name]   Electrophysiologist:  n/a  Referring MD: Lupita Dawn, MD   Chief Complaint  Patient presents with  . Hospitalization Follow-up    s/p staged PCI    History of Present Illness:     [name] is a 50 y.o. female with a hx of HTN, and diabetes, obesity  Admitted in 4/17 with respiratory failure secondary to acute systolic CHF and non-STEMI. Echo demonstrated EF 40-45%. There was a questionable mass on the mitral valve which was ruled out by TEE. Cardiac catheterization demonstrated severe 2 vessel CAD with mid to distal LAD 99%, ostial D1 99%, and mid RCA 85%. Diagonal lesion was treated with POBA and the mid to distal LAD was treated with DES. She developed LBBB when the left ventricle was engaged and this persisted throughout her hospitalization. After discharge, she returned to the hospital and was readmitted with respiratory distress felt to be related to subglottic edema and she was reintubated. This was felt to be related to recent intubation and possible ACE inhibitor use. She was discharged home on prednisone. She was diagnosed with OSA during her hospitalization and placed on CPAP. She was seen in follow-up by Dr. Fletcher Anon 03/06/16. She complained of exertional dyspnea felt to represent class II angina despite antianginal therapy. Recommendation was to proceed with staged PCI of the RCA. She underwent LHC 03/14/16 with PCI of the mid RCA with a DES.   Returns for follow-up. Here alone. She denies chest discomfort. She does continue to feel somewhat short of breath with exertion. This is overall improved. She denies orthopnea, PND. She denies significant edema. Denies syncope or near syncope. She has felt somewhat lightheaded. She continues to have a significant cough with brownish sputum. She also notes wheezing. Denies fever.  Cough has been chronic since her initial admission in 4/17. She also notes significant acid reflux. Omeprazole has not helped her symptoms.   Past Medical History  Diagnosis Date  . Hypertension   . Flash pulmonary edema ([place]) 02/10/2016    requiring intubation  . CAD (coronary artery disease) 02/10/2016    a. 03/14/2016 Staged PCI with DES to mid RCA. b. NSTEMI 02/2016: DES to mid-LAD, POBA to 1st Diag, 85% stenosis of RCA noted with staged PCI recommended  . ARF (acute respiratory failure) ([place]) 02/10/2016  . Chronic combined systolic (congestive) and diastolic (congestive) heart failure ([place])     a. Echo 02/2016: EF 40-45% w/ Grade 2 DD. 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  . Myocardial infarction ([place]) 02/2016  . Type II diabetes mellitus ([place])   . OSA on CPAP     Past Surgical History  Procedure Laterality Date  . Anterior cervical decomp/discectomy fusion  08/28/2012    Procedure: ANTERIOR CERVICAL DECOMPRESSION/DISCECTOMY FUSION 2 LEVELS;  Surgeon: Otilio Connors, MD;  Location: [place] [facility]URO ORS;  Service: Neurosurgery;  Laterality: N/A;  Cervical four-five, cervical six-seven Anterior cervical decompression/diskectomy/fusion/LifeNet Bone/Trestle plate  . Back surgery    . Breast cyst excision Right   . Cardiac catheterization N/A 02/15/2016    Procedure: Left Heart Cath and Coronary Angiography;  Surgeon: Wellington Hampshire, MD;  Location: [place] CV LAB;  Service: Cardiovascular;  Laterality: N/A;  . Cardiac catheterization N/A 02/15/2016    Procedure: Coronary Stent Intervention;  Surgeon: Wellington Hampshire, MD;  Location: [place] CV LAB;  Service: Cardiovascular;  Laterality: N/A;  . Cardiac catheterization N/A 02/15/2016    Procedure: Coronary Balloon Angioplasty;  Surgeon: Wellington Hampshire, MD;  Location: [place] CV LAB;  Service: Cardiovascular;  Laterality: N/A;  . Coronary angioplasty with stent placement    . Cardiac catheterization N/A  03/14/2016    Procedure: Coronary Stent Intervention;  Surgeon: Wellington Hampshire, MD;  Location: [place] CV LAB;  Service: Cardiovascular;  Laterality: N/A;    Current Medications: Outpatient Prescriptions Prior to Visit  Medication Sig Dispense Refill  . acetaminophen (TYLENOL) 325 MG tablet Take 2 tablets (650 mg total) by mouth every 6 (six) hours as needed for mild pain (or Fever >/= 101). 60 tablet 0  . albuterol (PROVENTIL HFA;VENTOLIN HFA) 108 (90 Base) MCG/ACT inhaler Inhale 2 puffs into the lungs every 6 (six) hours as needed for wheezing or shortness of breath. 1 Inhaler 0  . AMBULATORY NON FORMULARY MEDICATION Take 90 mg by mouth 2 (two) times daily. Medication Name: Brilinta 90 mg BID (TWILIGHT Research study provided do NOT fill)    . AMBULATORY NON FORMULARY MEDICATION Take 81 mg by mouth daily. Medication Name: ASA 81 mg daily (TWILIGHT Research study provided)    . amLODipine (NORVASC) 10 MG tablet Take 1 tablet (10 mg total) by mouth daily. 30 tablet 2  . atorvastatin (LIPITOR) 40 MG tablet Take 1 tablet (40 mg total) by mouth daily at 6 PM. 90 tablet 0  . blood glucose meter kit and supplies KIT Dispense based on patient and insurance preference. Use up to four times daily as directed. (FOR ICD-9 250.00, 250.01). 1 each 0  . carvedilol (COREG) 12.5 MG tablet Take 1 tablet (12.5 mg total) by mouth 2 (two) times daily with a meal. 60 tablet 0  . docusate sodium (COLACE) 100 MG capsule Take 1 capsule (100 mg total) by mouth every 12 (twelve) hours. 30 capsule 0  . insulin glargine (LANTUS) 100 UNIT/ML injection Inject 0.2 mLs (20 Units total) into the skin at bedtime. 10 mL 0  . metFORMIN (GLUCOPHAGE) 500 MG tablet Take 1 tablet (500 mg total) by mouth 2 (two) times daily with a meal. 60 tablet 0  . senna-docusate (SENOKOT-S) 8.6-50 MG tablet Take 2 tablets by mouth daily. As needed for constipation. 60 tablet 1  . omeprazole (PRILOSEC) 40 MG capsule Take 1 capsule (40 mg total)  by mouth daily. 30 capsule 3  . potassium chloride SA (K-DUR,KLOR-CON) 20 MEQ tablet Take 1 tablet (20 mEq total) by mouth daily. (Patient not taking: Reported on 03/22/2016) 30 tablet 0  . polyethylene glycol powder (GLYCOLAX/MIRALAX) powder Take 17 g by mouth 2 (two) times daily as needed. (Patient not taking: Reported on 03/22/2016) 3350 g 1   No facility-administered medications prior to visit.      Allergies:   Ace inhibitors   Social History   Social History  . Marital Status: Married    Spouse Name: N/A  . Number of Children: N/A  . Years of Education: N/A   Social History Main Topics  . Smoking status: Never Smoker   . Smokeless tobacco: Never Used  . Alcohol Use: Yes     Comment: 03/14/2016 "I'll have a couple drinks on major holidays"  . Drug Use: Yes    Special: Marijuana     Comment: 03/14/2016 "last use was in 02/2016"  . Sexual Activity: Not Currently    Birth  Control/ Protection: None   Other Topics Concern  . None   Social History Narrative   Lives with husband.  Has one daughter.  Work full time at [employer].   Some high school education.        Mother's history is unknown patient raised by paternal grandmother      Family History:  The patient's family history includes Diabetes in her paternal grandmother; Heart disease in her paternal aunt; Heart disease (age of onset: 21) in her paternal grandmother; Heart disease (age of onset: 76) in her father; Hypertension in her paternal grandmother. There is no history of Cancer, Stroke, or Alcohol abuse.   ROS:   Please see the history of present illness.    Review of Systems  Respiratory: Positive for cough.   Musculoskeletal: Positive for joint pain.  Gastrointestinal: Positive for nausea.  Neurological: Positive for loss of balance.   All other systems reviewed and are negative.   Physical Exam:    VS:  BP 128/70 mmHg  Pulse 70  Ht 5' 2.5" (1.588 m)  Wt 228 lb 3.2 oz (103.511 kg)  BMI 41.05 kg/m2   LMP 02/19/2016   GEN: Well nourished, well developed, in no acute distress HEENT: normal Neck: no JVD, no masses Cardiac: Normal S1/S2, RRR; no murmurs, rubs, or gallops, no edema;  R groin without hematoma or bruit   Respiratory:  clear to auscultation bilaterally; no wheezing, rhonchi or rales GI: soft, nontender, nondistended MS: no deformity or atrophy Skin: warm and dry Neuro: No focal deficits  Psych: Alert and oriented x 3, normal affect  Wt Readings from Last 3 Encounters:  03/22/16 228 lb 3.2 oz (103.511 kg)  03/15/16 222 lb 10.6 oz (101 kg)  03/10/16 233 lb 4.8 oz (105.824 kg)      Studies/Labs Reviewed:     EKG:  EKG is  ordered today.  The ekg ordered today demonstrates NSR, HR 71, normal axis, T-wave inversions in 1, aVL, V5-V6, no significant change when compared to prior tracings, QTc 430 ms  Recent Labs: 02/10/2016: B Natriuretic Peptide 227.6* 02/25/2016: Magnesium 2.1 03/08/2016: ALT 30 03/15/2016: BUN 14; Creatinine, Ser 0.92; Hemoglobin 8.9*; Platelets 162; Potassium 3.3*; Sodium 138   Recent Lipid Panel    Component Value Date/Time   CHOL 169 11/17/2013 0935   TRIG 749* 02/10/2016 2355   HDL 48 11/17/2013 0935   CHOLHDL 3.5 11/17/2013 0935   VLDL 22 11/17/2013 0935   LDLCALC 99 11/17/2013 0935    Additional studies/ records that were reviewed today include:   LHC 03/14/16 LM normal LAD ostial 50%, mid stent patent, D1 PCI site patent with 40%, D2 70% LCx ostial 20%, OM2 40%, OM3 50% RCA proximal 40%, mid 90%, distal 50% RPDA 70% PCI: 3 x 15 mm Xience Alpine DES to the mid RCA successful angioplasty and drug-eluting stent placement to the midright coronary artery. The distal RCA has 50% diffuse disease into the right PDA which has 70% stenosis. These were left to be treated medically.  TEE 02/22/16 EF 55-60%, mitral valve without mass or vegetation  LHC 02/15/16 2.25 x 26 mm Resolute integrity DES to the mid LAD, POBA to the D1 Residual mid RCA  stenosis 85%  Echo 02/13/16 Severe LVH, EF 40-45%, apical and inferoapical akinesis, no mural thrombus, grade 2 diastolic dysfunction, MAC, possible mobile mass on atrial side of mitral valve, mild LAE, normal RVSF, mild RAE  Echo 02/11/16 EF 29-47%, grade 2 diastolic dysfunction, normal wall  motion  Myoview 1/15 IMPRESSION: Low risk nuclear perfusion study. Findings suggest nonischemic cardiomyopathy, but "balanced ischemia" due to multivessel or dominant left coronary disease cannot be entirely excluded.  ASSESSMENT:     1. CAD, multiple vessel   2. Chronic combined systolic (congestive) and diastolic (congestive) heart failure ([place])   3. Hypertensive heart disease with heart failure ([place])   4. OSA (obstructive sleep apnea)   5. Type 2 diabetes mellitus with other circulatory complication, without long-term current use of insulin ([place])   6. HLD (hyperlipidemia)   7. Cough   8. Gastroesophageal reflux disease without esophagitis     PLAN:     In order of problems listed above:  1. CAD - She has suffered a recent non-STEMI treated with balloon angioplasty to the diagonal and DES to the LAD. She underwent staged procedure with DES to the RCA 03/14/16. She is overall stable. She understands the importance of dual antiplatelet therapy. She is receiving aspirin and Brilinta through the [place] study. She was taken off of ACE inhibitor therapy secondary to subglottic edema/question angioedema.   -  Continue aspirin, Brilinta, statin, beta blocker.   -  FU Lipids and LFTs in 4-6 weeks.  -  She starts cardiac rehabilitation soon.   -  Follow-up with Dr. Fletcher Anon in 6 weeks.  2. Chronic Combined Systolic and Diastolic CHF - Discrepancy noted between studies regarding EF.  EF 40-45% on one study and 55-60% on another.  She appears to have stable volume off diuretics. Since she still has some DOE and cough, will get FU BMET, BNP.  If BNP is elevated, will need to start diuretics.  Continue beta  blocker.  No ACE or ARB due to recent ? Angioedema.    3. HTN - BP controlled.   4. OSA - Referral to Dr. Radford Pax next month pending.  5. DM - We discussed the importance of FU with PCP to manage diabetes to reduce the risk of adverse coronary events.  6. Cough - FU with pulmonary as planned.  May be combination of recent intubation, subglottic edema, CHF, GERD.  Lungs are clear.  No need for repeat CXR. Adjust Rx for GERD.  Obtain BNP. FU with PCP later today.  7. GERD - No relief with Omeprazole.  DC Omeprazole.  Start Nexium 40 mg QD.  Further refills with GI.     Medication Adjustments/Labs and Tests Ordered: Current medicines are reviewed at length with the patient today.  Concerns regarding medicines are outlined above.  Medication changes, Labs and Tests ordered today are outlined in the Patient Instructions noted below. Patient Instructions  Medication Instructions:  1. STOP PRILOSEC  2. START NEXIUM 40 MG DAILY # 30 X 1 REFILL; FURTHER REFILLS BY GI Labwork: 1. TODAY BMET, BNP 2. FASTING LIPID AND LIVER PANEL TO BE DONE IN 1 MONTH Testing/Procedures: NONE Follow-Up: CANCEL DR. Fletcher Anon APPT FOR 6/6 AND MOVE OUT ABOUT 6 WEEKS FROM NOW Any Other Special Instructions Will Be Listed Below (If Applicable). YOU HAVE BEEN GIVEN A WORK NOTE TODAY If you need a refill on your cardiac medications before your next appointment, please call your pharmacy.   Signed, Richardson Dopp, PA-C  03/22/2016 12:39 PM    [facility] [address], [place], [place]  [address] Phone: [phone]; Fax: [phone]

## 2016-03-21 NOTE — Telephone Encounter (Signed)
Returned patient call. She states that Dr. Sherene Sires told her to take two 5 mg tablets at her most recent visit. I will send in 10 mg Norvasc to pharmacy.   Patient also states that she currently has a letter that excuses her from work until 5/24. She is uncertain if she will be ready to return to work at this time. She did recently have [facility]. I do not have any open appointments next week to see her. I informed her to call the office and schedule an appointment to see Dr. Levert Feinstein.

## 2016-03-22 ENCOUNTER — Ambulatory Visit: Payer: BLUE CROSS/BLUE SHIELD | Admitting: Family Medicine

## 2016-03-22 ENCOUNTER — Other Ambulatory Visit: Payer: Self-pay

## 2016-03-22 ENCOUNTER — Ambulatory Visit (INDEPENDENT_AMBULATORY_CARE_PROVIDER_SITE_OTHER): Payer: BLUE CROSS/BLUE SHIELD | Admitting: Physician Assistant

## 2016-03-22 ENCOUNTER — Encounter: Payer: Self-pay | Admitting: Physician Assistant

## 2016-03-22 ENCOUNTER — Telehealth: Payer: Self-pay | Admitting: *Deleted

## 2016-03-22 VITALS — BP 128/70 | HR 70 | Ht 62.5 in | Wt 228.2 lb

## 2016-03-22 DIAGNOSIS — R059 Cough, unspecified: Secondary | ICD-10-CM

## 2016-03-22 DIAGNOSIS — E785 Hyperlipidemia, unspecified: Secondary | ICD-10-CM

## 2016-03-22 DIAGNOSIS — I5042 Chronic combined systolic (congestive) and diastolic (congestive) heart failure: Secondary | ICD-10-CM | POA: Diagnosis not present

## 2016-03-22 DIAGNOSIS — K219 Gastro-esophageal reflux disease without esophagitis: Secondary | ICD-10-CM

## 2016-03-22 DIAGNOSIS — I2510 Atherosclerotic heart disease of native coronary artery without angina pectoris: Secondary | ICD-10-CM | POA: Diagnosis not present

## 2016-03-22 DIAGNOSIS — I110 Hypertensive heart disease with heart failure: Secondary | ICD-10-CM | POA: Diagnosis not present

## 2016-03-22 DIAGNOSIS — E1159 Type 2 diabetes mellitus with other circulatory complications: Secondary | ICD-10-CM

## 2016-03-22 DIAGNOSIS — G4733 Obstructive sleep apnea (adult) (pediatric): Secondary | ICD-10-CM

## 2016-03-22 DIAGNOSIS — R05 Cough: Secondary | ICD-10-CM

## 2016-03-22 LAB — BASIC METABOLIC PANEL
BUN: 9 mg/dL (ref 7–25)
CHLORIDE: 98 mmol/L (ref 98–110)
CO2: 29 mmol/L (ref 20–31)
Calcium: 9.1 mg/dL (ref 8.6–10.2)
Creat: 0.89 mg/dL (ref 0.50–1.10)
GLUCOSE: 263 mg/dL — AB (ref 65–99)
POTASSIUM: 4.1 mmol/L (ref 3.5–5.3)
SODIUM: 139 mmol/L (ref 135–146)

## 2016-03-22 LAB — BRAIN NATRIURETIC PEPTIDE: BRAIN NATRIURETIC PEPTIDE: 64 pg/mL (ref <100)

## 2016-03-22 MED ORDER — ESOMEPRAZOLE MAGNESIUM 40 MG PO PACK: 40.0000 mg | PACK | Freq: Every day | ORAL | Status: DC

## 2016-03-22 MED ORDER — ESOMEPRAZOLE MAGNESIUM 40 MG PO CPDR: 40.0000 mg | DELAYED_RELEASE_CAPSULE | Freq: Every day | ORAL | Status: DC

## 2016-03-22 NOTE — Patient Instructions (Addendum)
Medication Instructions:  1. STOP PRILOSEC  2. START NEXIUM 40 MG DAILY # 30 X 1 REFILL; FURTHER REFILLS BY GI Labwork: 1. TODAY BMET, BNP 2. FASTING LIPID AND LIVER PANEL TO BE DONE IN 1 MONTH Testing/Procedures: NONE Follow-Up: CANCEL DR. Kirke Corin APPT FOR 6/6 AND MOVE OUT ABOUT 6 WEEKS FROM NOW Any Other Special Instructions Will Be Listed Below (If Applicable). YOU HAVE BEEN GIVEN A WORK NOTE TODAY If you need a refill on your cardiac medications before your next appointment, please call your pharmacy.

## 2016-03-22 NOTE — Telephone Encounter (Signed)
Lmtcb on home # to go over lab results, cell # recording state cell not a working #.

## 2016-03-23 ENCOUNTER — Telehealth: Payer: Self-pay | Admitting: Physician Assistant

## 2016-03-23 NOTE — Telephone Encounter (Signed)
New message ° ° ° ° °Returning a call to the nurse °

## 2016-03-23 NOTE — Telephone Encounter (Signed)
I returned pt's call now though had to lmtcb

## 2016-03-23 NOTE — Telephone Encounter (Signed)
Pt notified of lab results by phone. Pt advised to f/u w/PCP about her diabetes. Pt agreeable to plan of care.

## 2016-03-23 NOTE — Telephone Encounter (Signed)
Lmtcb x 2 to go over lab results 

## 2016-03-23 NOTE — Telephone Encounter (Signed)
Follow-up     Pt returning phone call regarding lab work.

## 2016-03-27 ENCOUNTER — Other Ambulatory Visit: Payer: Self-pay | Admitting: *Deleted

## 2016-03-27 ENCOUNTER — Ambulatory Visit
Admission: RE | Admit: 2016-03-27 | Discharge: 2016-03-27 | Disposition: A | Discharge status: Home / Self Care | Payer: BLUE CROSS/BLUE SHIELD | Source: Ambulatory Visit | Attending: Family Medicine | Admitting: Family Medicine

## 2016-03-27 ENCOUNTER — Encounter: Payer: Self-pay | Admitting: Family Medicine

## 2016-03-27 ENCOUNTER — Ambulatory Visit (INDEPENDENT_AMBULATORY_CARE_PROVIDER_SITE_OTHER): Payer: BLUE CROSS/BLUE SHIELD | Admitting: Family Medicine

## 2016-03-27 VITALS — BP 126/61 | HR 67 | Temp 98.2°F | Ht 62.5 in | Wt 227.1 lb

## 2016-03-27 DIAGNOSIS — G4733 Obstructive sleep apnea (adult) (pediatric): Secondary | ICD-10-CM

## 2016-03-27 DIAGNOSIS — R059 Cough, unspecified: Secondary | ICD-10-CM

## 2016-03-27 DIAGNOSIS — E1159 Type 2 diabetes mellitus with other circulatory complications: Secondary | ICD-10-CM

## 2016-03-27 DIAGNOSIS — R05 Cough: Secondary | ICD-10-CM | POA: Diagnosis not present

## 2016-03-27 MED ORDER — METFORMIN HCL 1000 MG PO TABS: 1000.0000 mg | ORAL_TABLET | Freq: Two times a day (BID) | ORAL | Status: DC

## 2016-03-27 MED ORDER — RANITIDINE HCL 150 MG PO TABS: 150.0000 mg | ORAL_TABLET | Freq: Two times a day (BID) | ORAL | Status: DC

## 2016-03-27 MED ORDER — CARVEDILOL 12.5 MG PO TABS: 12.5000 mg | ORAL_TABLET | Freq: Two times a day (BID) | ORAL | Status: DC

## 2016-03-27 NOTE — Progress Notes (Signed)
Date of Visit: 03/27/2016   HPI:  Recently hospitalized twice and required intubation each time for respiratory distress. Had NSTEMI with cath & PCI. On dual antiplatelet therapy (aspirin and brilinta). Had follow up [facility] on 03/14/16 with PCI of mid RCA with DES. Last cardiology appointment on 5/18 got BMET, BNP which were unremarkable.   Cough - Started on nexium at recent cardiology appointment. Saw pulm who referred her to GI, thinking this was more likely related to Gi symptoms. Coughs a lot and has to pull over when driving. Gets hoarse easily. No fevers. Has globus sensation. nexium has not really helped much. Her GI appointment is for in July.   Diabetes - Last A1c 9.2 on 02/15/16. Is aware that sugars have been high recently. Wonders if metformin should be increased. Currently on 500mg  twice daily .  Sleep apnea - not on CPAP presently. Was told in the hospital that she really needs to be on CPAP and to "not leave the hospital until you have a machine". She does not have a machien presently. Last sleep study was years ago. Has appointment in 1 week with Dr. Mayford Knife to discuss OSA.  Return to work - has missed work for the last several weeks due to being in the hospital & having follow up appts. Is presently scheduled to return to work on May 30. She works as a Conservation officer, nature at [employer]. She is worried about returning on this date, is afraid she isn't ready and that she will have to leave work again. This would start the short term disability process all over and she might get behind on her bills. She would feel better about waiting until after she starts cardiac rehab, which is June 20. Went to one physical therapy appointment but they didn't feel she had physical therapy needs.  ROS: See HPI.  PMFSH: history of hyperlipidemia, hypertension, type 2 diabetes, CAD multivessel, morbid obesity, GERD, OSA, systolic & diastolic CHF, DDD  PHYSICAL EXAM: BP 126/61 mmHg  Pulse 67  Temp(Src) 98.2 F (36.8  C) (Oral)  Ht 5' 2.5" (1.588 m)  Wt 227 lb 1.6 oz (103.012 kg)  BMI 40.85 kg/m2  LMP 02/19/2016 Gen: NAD, pleasant cooperative HEENT: normocephalic, atraumatic, moist mucous membranes  Heart: regular rate and rhythm, no murmur Lungs: clear to auscultation bilaterally normal work of breathing  Neuro: alert, grossly nonfocal, speech normal Ext: No appreciable lower extremity edema bilaterally  Abdomen: soft nontender to palpation no masses or organomegaly  ASSESSMENT/PLAN:  Diabetes mellitus, type 2 (HCC) Uncontrolled. Increase metformin to 1000mg  twice daily. Follow up with PCP in 3 weeks for routine medical problems.  OSA (obstructive sleep apnea) Has upcoming appointment with Dr. Mayford Knife to address CPAP. Discussed with patient that I can't hurry that process along sooner, suspect she will need CPAP titration study. Defer management to Dr. Mayford Knife as scheduled.  Cough Unclear if persistent cough due to GERD or persistent irritation after multiple intubations. No recent CXR in the last few weeks, so will repeat. Add zantac to nexium. Will also message our referral coordinator and see if GI appointment can get moved up at all. Patient agreeable to this plan.  Return to work - patient has reasonable concerns about not doing well with going back to work on may 30. 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 Note written for her to  return on July 1. Patient appreciative.  FOLLOW UP: Follow up in 3 weeks with PCP for chronic medical issues Keep appointment with Dr. Mayford Knife for OSA See about moving GI appointment up sooner  Jamie J. Pollie Meyer, MD [facility]

## 2016-03-27 NOTE — Patient Instructions (Signed)
For diabetes: -increase metformin to 1000mg  twice daily   For cough: -go get chest xray -seeing about GI appointment and whether this can happen any sooner -add zantac to the nexium - sent this in for you  For work: -note to go back on July 1, this is after you start cardiac rehab  Follow up with Dr. Randolm Idol in 3 weeks  Be well, Dr. Pollie Meyer

## 2016-03-28 DIAGNOSIS — R05 Cough: Secondary | ICD-10-CM | POA: Insufficient documentation

## 2016-03-28 DIAGNOSIS — R059 Cough, unspecified: Secondary | ICD-10-CM | POA: Insufficient documentation

## 2016-03-28 NOTE — Assessment & Plan Note (Signed)
Has upcoming appointment with Dr. Mayford Knife to address CPAP. Discussed with patient that I can't hurry that process along sooner, suspect she will need CPAP titration study. Defer management to Dr. Mayford Knife as scheduled.

## 2016-03-28 NOTE — Assessment & Plan Note (Signed)
Unclear if persistent cough due to GERD or persistent irritation after multiple intubations. No recent CXR in the last few weeks, so will repeat. Add zantac to nexium. Will also message our referral coordinator and see if GI appointment can get moved up at all. Patient agreeable to this plan.

## 2016-03-28 NOTE — Assessment & Plan Note (Signed)
Uncontrolled. Increase metformin to 1000mg  twice daily. Follow up with PCP in 3 weeks for routine medical problems.

## 2016-04-03 ENCOUNTER — Encounter: Payer: Self-pay | Admitting: Cardiology

## 2016-04-03 ENCOUNTER — Ambulatory Visit (INDEPENDENT_AMBULATORY_CARE_PROVIDER_SITE_OTHER): Payer: BLUE CROSS/BLUE SHIELD | Admitting: Cardiology

## 2016-04-03 VITALS — BP 128/72 | HR 69 | Ht 62.5 in | Wt 228.5 lb

## 2016-04-03 DIAGNOSIS — I10 Essential (primary) hypertension: Secondary | ICD-10-CM

## 2016-04-03 DIAGNOSIS — G4733 Obstructive sleep apnea (adult) (pediatric): Secondary | ICD-10-CM

## 2016-04-03 DIAGNOSIS — E669 Obesity, unspecified: Secondary | ICD-10-CM

## 2016-04-03 HISTORY — DX: Obesity, unspecified: E66.9

## 2016-04-03 NOTE — Progress Notes (Signed)
Cardiology Office Note    Date:  04/03/2016   ID:  JALAN BODI, DOB 1966/07/16, MRN [id number]  PCP:  Lupita Dawn, MD  Cardiologist:  Fransico Him, MD   Chief Complaint  Patient presents with  . Sleep Apnea  . Hypertension    History of Present Illness:  [name] is a 50 y.o. female who has a history of CAD, chronic systolic CHF and OSA who is on CPAP.  She is currently not followed with anyone for her OSA care.  She says that she had a sleep study years ago and has been on CPAP in the past but has not been on it recently because her machine broke.  She has nonrestorative sleep and feels tired during the day.  She occasionally will wake herself up snoring.  She was apparently noted to have witnessed apnea on recent hospitalization for CAD and was placed on CPAP and referred here for further evaluation.  She says that she does not sleep well.  She is not sleepy at night and then when she gets to sleep she does not rest well due to RLS.  She does not take anything for her RLS.      Past Medical History  Diagnosis Date  . Hypertension   . Flash pulmonary edema ([place]) 02/10/2016    requiring intubation  . CAD (coronary artery disease) 02/10/2016    a. 03/14/2016 Staged PCI with DES to mid RCA. b. NSTEMI 02/2016: DES to mid-LAD, POBA to 1st Diag, 85% stenosis of RCA noted with staged PCI recommended  . ARF (acute respiratory failure) ([place]) 02/10/2016  . Chronic combined systolic (congestive) and diastolic (congestive) heart failure ([place])     a. Echo 02/2016: EF 40-45% w/ Grade 2 DD. 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  . Myocardial infarction ([place]) 02/2016  . Type II diabetes mellitus ([place])   . OSA on CPAP   . Obesity (BMI 30-39.9) 04/03/2016    Past Surgical History  Procedure Laterality Date  . Anterior cervical decomp/discectomy fusion  08/28/2012    Procedure: ANTERIOR CERVICAL DECOMPRESSION/DISCECTOMY FUSION 2 LEVELS;  Surgeon: Otilio Connors, MD;  Location: [facility];  Service: Neurosurgery;  Laterality: N/A;  Cervical four-five, cervical six-seven Anterior cervical decompression/diskectomy/fusion/LifeNet Bone/Trestle plate  . Back surgery    . Breast cyst excision Right   . Cardiac catheterization N/A 02/15/2016    Procedure: Left Heart Cath and Coronary Angiography;  Surgeon: Wellington Hampshire, MD;  Location: [facility];  Service: Cardiovascular;  Laterality: N/A;  . Cardiac catheterization N/A 02/15/2016    Procedure: Coronary Stent Intervention;  Surgeon: Wellington Hampshire, MD;  Location: [facility];  Service: Cardiovascular;  Laterality: N/A;  . Cardiac catheterization N/A 02/15/2016    Procedure: Coronary Balloon Angioplasty;  Surgeon: Wellington Hampshire, MD;  Location: [facility];  Service: Cardiovascular;  Laterality: N/A;  . Coronary angioplasty with stent placement    . Cardiac catheterization N/A 03/14/2016    Procedure: Coronary Stent Intervention;  Surgeon: Wellington Hampshire, MD;  Location: [facility];  Service: Cardiovascular;  Laterality: N/A;    Current Medications: Outpatient Prescriptions Prior to Visit  Medication Sig Dispense Refill  . acetaminophen (TYLENOL) 325 MG tablet Take 2 tablets (650 mg total) by mouth every 6 (six) hours as needed for mild pain (or Fever >/= 101). 60 tablet 0  . albuterol (PROVENTIL HFA;VENTOLIN HFA) 108 (90 Base)  MCG/ACT inhaler Inhale 2 puffs into the lungs every 6 (six) hours as needed for wheezing or shortness of breath. 1 Inhaler 0  . AMBULATORY NON FORMULARY MEDICATION Take 90 mg by mouth 2 (two) times daily. Medication Name: Brilinta 90 mg BID (TWILIGHT Research study provided do NOT fill)    . AMBULATORY NON FORMULARY MEDICATION Take 81 mg by mouth daily. Medication Name: ASA 81 mg daily (TWILIGHT Research study provided)    . amLODipine (NORVASC) 10 MG tablet Take 1 tablet (10 mg total) by mouth daily. 30 tablet 2  . atorvastatin (LIPITOR) 40  MG tablet Take 1 tablet (40 mg total) by mouth daily at 6 PM. 90 tablet 0  . blood glucose meter kit and supplies KIT Dispense based on patient and insurance preference. Use up to four times daily as directed. (FOR ICD-9 250.00, 250.01). 1 each 0  . carvedilol (COREG) 12.5 MG tablet Take 1 tablet (12.5 mg total) by mouth 2 (two) times daily with a meal. 60 tablet 2  . docusate sodium (COLACE) 100 MG capsule Take 1 capsule (100 mg total) by mouth every 12 (twelve) hours. 30 capsule 0  . esomeprazole (NEXIUM) 40 MG capsule Take 1 capsule (40 mg total) by mouth daily at 12 noon. 30 capsule 1  . insulin glargine (LANTUS) 100 UNIT/ML injection Inject 0.2 mLs (20 Units total) into the skin at bedtime. 10 mL 0  . metFORMIN (GLUCOPHAGE) 1000 MG tablet Take 1 tablet (1,000 mg total) by mouth 2 (two) times daily with a meal. 60 tablet 3  . polyethylene glycol (MIRALAX / GLYCOLAX) packet Take 17 g by mouth daily as needed for mild constipation or moderate constipation.    . potassium chloride SA (K-DUR,KLOR-CON) 20 MEQ tablet Take 1 tablet (20 mEq total) by mouth daily. 30 tablet 0  . ranitidine (ZANTAC) 150 MG tablet Take 1 tablet (150 mg total) by mouth 2 (two) times daily. 60 tablet 1  . senna-docusate (SENOKOT-S) 8.6-50 MG tablet Take 2 tablets by mouth daily. As needed for constipation. 60 tablet 1   No facility-administered medications prior to visit.     Allergies:   Ace inhibitors   Social History   Social History  . Marital Status: Married    Spouse Name: N/A  . Number of Children: N/A  . Years of Education: N/A   Social History Main Topics  . Smoking status: Never Smoker   . Smokeless tobacco: Never Used  . Alcohol Use: Yes     Comment: 03/14/2016 "I'll have a couple drinks on major holidays"  . Drug Use: Yes    Special: Marijuana     Comment: 03/14/2016 "last use was in 02/2016"  . Sexual Activity: Not Currently    Birth Control/ Protection: None   Other Topics Concern  . None    Social History Narrative   Lives with husband.  Has one daughter.  Work full time at [employer].   Some high school education.        Mother's history is unknown patient raised by paternal grandmother      Family History:  The patient'sfamily history includes Diabetes in her paternal grandmother; Heart disease in her paternal aunt; Heart disease (age of onset: 50) in her paternal grandmother; Heart disease (age of onset: 57) in her father; Hypertension in her paternal grandmother. There is no history of Cancer, Stroke, or Alcohol abuse.   ROS:   Please see the history of present illness.    ROS  All other systems reviewed and are negative.   PHYSICAL EXAM:   VS:  BP 128/72 mmHg  Pulse 69  Ht 5' 2.5" (1.588 m)  Wt 228 lb 8 oz (103.647 kg)  BMI 41.10 kg/m2  SpO2 97%  LMP 02/19/2016   GEN: Well nourished, well developed, in no acute distress HEENT: normal Neck: no JVD, carotid bruits, or masses Cardiac: RRR; no murmurs, rubs, or gallops,no edema.  Intact distal pulses bilaterally.  Respiratory:  clear to auscultation bilaterally, normal work of breathing GI: soft, nontender, nondistended, + BS MS: no deformity or atrophy Skin: warm and dry, no rash Neuro:  Alert and Oriented x 3, Strength and sensation are intact Psych: euthymic mood, full affect  Wt Readings from Last 3 Encounters:  04/03/16 228 lb 8 oz (103.647 kg)  03/27/16 227 lb 1.6 oz (103.012 kg)  03/22/16 228 lb 3.2 oz (103.511 kg)      Studies/Labs Reviewed:   EKG:  EKG is not ordered today.    Recent Labs: 02/25/2016: Magnesium 2.1 03/08/2016: ALT 30 03/15/2016: Hemoglobin 8.9*; Platelets 162 03/22/2016: Brain Natriuretic Peptide 64.0; BUN 9; Creat 0.89; Potassium 4.1; Sodium 139   Lipid Panel    Component Value Date/Time   CHOL 169 11/17/2013 0935   TRIG 749* 02/10/2016 2355   HDL 48 11/17/2013 0935   CHOLHDL 3.5 11/17/2013 0935   VLDL 22 11/17/2013 0935   LDLCALC 99 11/17/2013 0935    Additional  studies/ records that were reviewed today include:  Hospital notes from last admission    ASSESSMENT:    1. OSA (obstructive sleep apnea)   2. Essential hypertension   3. Obesity (BMI 30-39.9)      PLAN:  In order of problems listed above:  OSA - The patient has a history of OSA over 11 years ago but has not been on CPAP in some time due to her machine breaking.  She has excessive daytime sleepiness, nonrestorative sleep and significant restless legs which may be due to untreated OSA.  I will set her up for a split night sleep study ASAP.   HTN - BP controlled on current medical regimen.  Continue amlodipine/ BB 3.  Obesity - I have encouraged him to get into a routine exercise program and cut back on carbs and portions.    Medication Adjustments/Labs and Tests Ordered: Current medicines are reviewed at length with the patient today.  Concerns regarding medicines are outlined above.  Medication changes, Labs and Tests ordered today are listed in the Patient Instructions below.  Patient Instructions  Medication Instructions:  Your physician recommends that you continue on your current medications as directed. Please refer to the Current Medication list given to you today.   Labwork: None  Testing/Procedures: Your physician has recommended that you have a sleep study ASAP. This test records several body functions during sleep, including: brain activity, eye movement, oxygen and carbon dioxide blood levels, heart rate and rhythm, breathing rate and rhythm, the flow of air through your mouth and nose, snoring, body muscle movements, and chest and belly movement.  Follow-Up: Your physician recommends that you schedule a follow-up appointment with Dr. Radford Pax pending sleep study results.  Any Other Special Instructions Will Be Listed Below (If Applicable).     If you need a refill on your cardiac medications before your next appointment, please call your pharmacy.        Signed, Fransico Him, MD  04/03/2016 12:59 PM    [facility]  [address], [place], [place]  [address] Phone: [phone]; Fax: [phone]    

## 2016-04-03 NOTE — Patient Instructions (Signed)
Medication Instructions:  Your physician recommends that you continue on your current medications as directed. Please refer to the Current Medication list given to you today.   Labwork: None  Testing/Procedures: Your physician has recommended that you have a sleep study ASAP. This test records several body functions during sleep, including: brain activity, eye movement, oxygen and carbon dioxide blood levels, heart rate and rhythm, breathing rate and rhythm, the flow of air through your mouth and nose, snoring, body muscle movements, and chest and belly movement.  Follow-Up: Your physician recommends that you schedule a follow-up appointment with Dr. Mayford Knife pending sleep study results.  Any Other Special Instructions Will Be Listed Below (If Applicable).     If you need a refill on your cardiac medications before your next appointment, please call your pharmacy.

## 2016-04-05 ENCOUNTER — Telehealth: Payer: Self-pay | Admitting: Cardiovascular Disease

## 2016-04-05 NOTE — Telephone Encounter (Signed)
Spoke with pt, she is requesting the note from scott weaver pa and the note from dr turner be faxed to her benefits person at [phone]. Case number [id number]. Nates faxed to the number provided.

## 2016-04-05 NOTE — Telephone Encounter (Signed)
Ms.Hamre is calling about her medical Benefits at work . She is stating that she is not getting benefits , because her Job benefits person states that they have not received anything from the office so they cut her benefits off. Please call   Thanks

## 2016-04-07 ENCOUNTER — Ambulatory Visit (HOSPITAL_BASED_OUTPATIENT_CLINIC_OR_DEPARTMENT_OTHER): Payer: BLUE CROSS/BLUE SHIELD | Attending: Cardiology | Admitting: Cardiology

## 2016-04-07 VITALS — Wt 228.0 lb

## 2016-04-07 DIAGNOSIS — I491 Atrial premature depolarization: Secondary | ICD-10-CM | POA: Insufficient documentation

## 2016-04-07 DIAGNOSIS — R0683 Snoring: Secondary | ICD-10-CM | POA: Diagnosis not present

## 2016-04-07 DIAGNOSIS — G4733 Obstructive sleep apnea (adult) (pediatric): Secondary | ICD-10-CM | POA: Diagnosis not present

## 2016-04-07 DIAGNOSIS — G4719 Other hypersomnia: Secondary | ICD-10-CM | POA: Diagnosis present

## 2016-04-07 HISTORY — PX: SPLIT NIGHT STUDY: SLE1000

## 2016-04-08 ENCOUNTER — Encounter (HOSPITAL_BASED_OUTPATIENT_CLINIC_OR_DEPARTMENT_OTHER): Payer: Self-pay | Admitting: Cardiology

## 2016-04-08 ENCOUNTER — Telehealth: Payer: Self-pay | Admitting: Cardiology

## 2016-04-08 NOTE — Telephone Encounter (Signed)
Please let patient know that they have sleep apnea and recommend CPAP titration. Please set up titration in the sleep lab. 

## 2016-04-08 NOTE — Procedures (Signed)
   Patient Name: [name], [name] MRN: [id number] Study Date: 04/07/2016 Gender: Female D.O.B: Aug 20, 1966 Age (years): 63 Referring Provider: Armanda Magic MD, ABSM Interpreting Physician: Armanda Magic MD, ABSM RPSGT: Rolene Arbour  Weight (lbs): 228 BMI: 41 Height (inches): 63 Neck Size: 19.50  CLINICAL INFORMATION Sleep Study Type: NPSG Indication for sleep study: OSA Epworth Sleepiness Score: 24  SLEEP STUDY TECHNIQUE As per the AASM Manual for the Scoring of Sleep and Associated Events v2.3 (April 2016) with a hypopnea requiring 4% desaturations. The channels recorded and monitored were frontal, central and occipital EEG, electrooculogram (EOG), submentalis EMG (chin), nasal and oral airflow, thoracic and abdominal wall motion, anterior tibialis EMG, snore microphone, electrocardiogram, and pulse oximetry.  MEDICATIONS Patient's medications include: Reviewed in the chart. Medications self-administered by patient during sleep study : No sleep medicine administered.  SLEEP ARCHITECTURE The study was initiated at 10:35:13 PM and ended at 5:08:13 AM. Sleep onset time was 23.2 minutes and the sleep efficiency was 69.0%. The total sleep time was 271.0 minutes. Stage REM latency was N/A minutes. The patient spent 6.64% of the night in stage N1 sleep, 93.36% in stage N2 sleep, 0.00% in stage N3 and 0.00% in REM. Alpha intrusion was absent. Supine sleep was 7.20%.  RESPIRATORY PARAMETERS The overall apnea/hypopnea index (AHI) was 8.6 per hour. There were 0 total apneas, including 0 obstructive, 0 central and 0 mixed apneas. There were 39 hypopneas and 28 RERAs. The AHI during Stage REM sleep was N/A per hour. AHI while supine was 0.0 per hour. The mean oxygen saturation was 94.16%. The minimum SpO2 during sleep was 87.00%. Soft snoring was noted during this study.  CARDIAC DATA The 2 lead EKG demonstrated sinus rhythm. The mean heart rate was 64.58 beats per minute. Other EKG  findings include: PACs.  LEG MOVEMENT DATA The total PLMS were 36 with a resulting PLMS index of 7.97. Associated arousal with leg movement index was 0.4 .  IMPRESSIONS - Mild obstructive sleep apnea occurred during this study (AHI = 8.6/h). - No significant central sleep apnea occurred during this study (CAI = 0.0/h). - Mild oxygen desaturation was noted during this study (Min O2 = 87.00%). - The patient snored with Soft snoring volume. - No cardiac abnormalities were noted during this study. - Mild periodic limb movements of sleep occurred during the study. No significant associated arousals.  DIAGNOSIS - Obstructive Sleep Apnea (327.23 [G47.33 ICD-10])  RECOMMENDATIONS - Therapeutic CPAP titration to determine optimal pressure required to alleviate sleep disordered breathing. - Avoid alcohol, sedatives and other CNS depressants that may worsen sleep apnea and disrupt normal sleep architecture. - Sleep hygiene should be reviewed to assess factors that may improve sleep quality. - Weight management and regular exercise should be initiated or continued if appropriate.     Armanda Magic Diplomate, American Board of Sleep Medicine  ELECTRONICALLY SIGNED ON:  04/08/2016, 8:02 PM [facility] PH: [phone]   FX: [phone] ACCREDITED BY THE AMERICAN ACADEMY OF SLEEP MEDICINE

## 2016-04-10 ENCOUNTER — Ambulatory Visit: Payer: BLUE CROSS/BLUE SHIELD | Admitting: Cardiovascular Disease

## 2016-04-10 ENCOUNTER — Institutional Professional Consult (permissible substitution): Payer: BLUE CROSS/BLUE SHIELD | Admitting: Pulmonary Disease

## 2016-04-10 ENCOUNTER — Encounter: Payer: Self-pay | Admitting: *Deleted

## 2016-04-10 DIAGNOSIS — Z006 Encounter for examination for normal comparison and control in clinical research program: Secondary | ICD-10-CM

## 2016-04-10 NOTE — Progress Notes (Signed)
TWILIGHT Research 1 month telephone follow up appointment completed. Patient denies bleeding or adverse events. States she has been compliant with study provided medication. Next appointment scheduled 06/15/16 @ 10 am . Questions encouraged and answered.

## 2016-04-19 ENCOUNTER — Ambulatory Visit (INDEPENDENT_AMBULATORY_CARE_PROVIDER_SITE_OTHER): Payer: BLUE CROSS/BLUE SHIELD | Admitting: Family Medicine

## 2016-04-19 ENCOUNTER — Telehealth: Payer: Self-pay | Admitting: Cardiology

## 2016-04-19 ENCOUNTER — Encounter: Payer: Self-pay | Admitting: Family Medicine

## 2016-04-19 VITALS — BP 148/88 | HR 73 | Temp 98.8°F | Ht 62.5 in | Wt 225.8 lb

## 2016-04-19 DIAGNOSIS — K219 Gastro-esophageal reflux disease without esophagitis: Secondary | ICD-10-CM

## 2016-04-19 DIAGNOSIS — Z0000 Encounter for general adult medical examination without abnormal findings: Secondary | ICD-10-CM | POA: Diagnosis not present

## 2016-04-19 DIAGNOSIS — E1159 Type 2 diabetes mellitus with other circulatory complications: Secondary | ICD-10-CM | POA: Diagnosis not present

## 2016-04-19 DIAGNOSIS — E1142 Type 2 diabetes mellitus with diabetic polyneuropathy: Secondary | ICD-10-CM | POA: Insufficient documentation

## 2016-04-19 DIAGNOSIS — I10 Essential (primary) hypertension: Secondary | ICD-10-CM

## 2016-04-19 DIAGNOSIS — G4733 Obstructive sleep apnea (adult) (pediatric): Secondary | ICD-10-CM

## 2016-04-19 DIAGNOSIS — E1140 Type 2 diabetes mellitus with diabetic neuropathy, unspecified: Secondary | ICD-10-CM | POA: Insufficient documentation

## 2016-04-19 LAB — TSH: TSH: 1.66 m[IU]/L

## 2016-04-19 LAB — POCT GLYCOSYLATED HEMOGLOBIN (HGB A1C): Hemoglobin A1C: 7.5

## 2016-04-19 MED ORDER — SUCRALFATE 1 G PO TABS: 1.0000 g | ORAL_TABLET | Freq: Three times a day (TID) | ORAL | Status: DC

## 2016-04-19 MED ORDER — PREGABALIN 75 MG PO CAPS: 75.0000 mg | ORAL_CAPSULE | Freq: Two times a day (BID) | ORAL | Status: DC

## 2016-04-19 MED ORDER — HYDROCHLOROTHIAZIDE 12.5 MG PO CAPS: 12.5000 mg | ORAL_CAPSULE | Freq: Every day | ORAL | Status: DC

## 2016-04-19 MED ORDER — INSULIN GLARGINE 100 UNIT/ML SOLOSTAR PEN: 20.0000 [IU] | PEN_INJECTOR | Freq: Every day | SUBCUTANEOUS | Status: DC

## 2016-04-19 NOTE — Telephone Encounter (Signed)
Patient informed of sleep study results. Stated verbal understanding.  Okay to proceed with titration.

## 2016-04-19 NOTE — Telephone Encounter (Signed)
Patient wanting sleep study results.  Please see previous phone note.

## 2016-04-19 NOTE — Progress Notes (Signed)
   Subjective:    Patient ID: [name], female    DOB: Jun 18, 1966, 50 y.o.   MRN: [id number]  HPI 50 y/o female presents for DM and OSA follow up.   Diabetes - Taking Lantus 20 units QHS and Metformin 1000 mg BID, would like insulin pen instead of syringe, Checking blood sugars intermittently: 189 in the AM fasting yesterday, has highs in the 300's, lows in the 160's, no hypoglycemic episodes. Has not been seen eye doctor in a few years. Going to see a nutritionist tomorrow.    Neuropathy - mostly in feet, aches mostly at night, has cramping in feet and toe pain, does have some numbness, reports open sore on right great toe  HTN - taking Norvasc 10 mg daily and Coreg 12.5 BID, checks at home (150's/70's). No chest pain, some sob at night (wakes from sleep), no swelling  Nausea - decreased appetite, acid refulx medications not working (currently on Nexium and TUMS), feels sensation in back of throat, associated cough, has upcoming appointment with [facility] on July 5  OSA - had recent positive sleep study, has not heard back from pulmonology  Social - starts PT tomorrow, lives alone at home, never a smoker    Review of Systems  Constitutional: Negative for fever, chills and fatigue.  Respiratory: Negative for cough, choking and shortness of breath.   Cardiovascular: Negative for chest pain.  Gastrointestinal: Negative for vomiting, diarrhea and constipation.       Objective:   Physical Exam BP 148/88 mmHg  Pulse 73  Temp(Src) 98.8 F (37.1 C) (Oral)  Ht 5' 2.5" (1.588 m)  Wt 225 lb 12.8 oz (102.422 kg)  BMI 40.62 kg/m2  LMP 03/26/2016  Gen: pleasant female, NAD Cardiac: RRR, S1 and S2 present, no murmur Resp: CTAB, normal effort Abd: soft, obese, no tenderness, normal bowel sounds, no HSM Ext: trace bilateral edema Foot Exam: See Quality Metrics (monofilament testing intact, 2+ DP pulses) Skin: right great toe - small 2 mm healing wound of medial aspect        Assessment & Plan:  Diabetes mellitus, type 2 (HCC) Due for check of A1C today. -adjust pharmacotherapy based on results -foot exam completed today -encouraged yearly eye exams  Diabetic peripheral neuropathy (HCC) Uncontrolled. -start Lyrica 75 mg BID  OSA (obstructive sleep apnea) Sleep study positive -patient to contact pulmonology to arrange CPAP  Essential hypertension Uncontrolled.  -start HCTZ 12.5 mg daily -continue Coreg 12.5 mg BID and Norvasc 10 mg daily  GERD (gastroesophageal reflux disease) Patient continues to have GERD symptoms with associated nausea. -has upcoming GI appointment -continue Nexium -added Carafate to current regimen

## 2016-04-19 NOTE — Patient Instructions (Addendum)
It ws nice to see you today.   Diabetes - will send in lantus pen, please get your eyes examined yearly (need dilated eye exam, have copy of note sent to Dr. Randolm Idol), we will check you hemoglobin A1C today.  Nerve pain - start Lyrica 75 mg twice daily  Nausea - I think this is related to acid reflux, continue Nexium but also start Carafate before meals and bedtime, follow up with GI next month.  Blood Pressure - continue Norvasc and Coreg, start 12.5 mg HCTZ   Sleep Apnea - according to your sleep study you have sleep apnea, Please call Dr. Mayford Knife to check on status of getting a CPAP machine.

## 2016-04-19 NOTE — Telephone Encounter (Signed)
New message  Pt called states that her sleep study supplies came in on 04/07/2016. Pt states she hasnt received a call indicating the supplies were in office. Please call back to discuss

## 2016-04-20 LAB — HIV ANTIBODY (ROUTINE TESTING W REFLEX): HIV 1&2 Ab, 4th Generation: NONREACTIVE

## 2016-04-20 NOTE — Assessment & Plan Note (Signed)
Due for check of A1C today. -adjust pharmacotherapy based on results -foot exam completed today -encouraged yearly eye exams

## 2016-04-20 NOTE — Assessment & Plan Note (Signed)
Sleep study positive -patient to contact pulmonology to arrange CPAP

## 2016-04-20 NOTE — Assessment & Plan Note (Signed)
Uncontrolled.  -start HCTZ 12.5 mg daily -continue Coreg 12.5 mg BID and Norvasc 10 mg daily

## 2016-04-20 NOTE — Assessment & Plan Note (Signed)
Patient continues to have GERD symptoms with associated nausea. -has upcoming GI appointment -continue Nexium -added Carafate to current regimen

## 2016-04-20 NOTE — Assessment & Plan Note (Signed)
Uncontrolled. -start Lyrica 75 mg BID

## 2016-04-23 ENCOUNTER — Other Ambulatory Visit: Payer: BLUE CROSS/BLUE SHIELD

## 2016-04-23 ENCOUNTER — Encounter (HOSPITAL_COMMUNITY): Payer: Self-pay | Admitting: *Deleted

## 2016-04-24 ENCOUNTER — Other Ambulatory Visit: Payer: Self-pay | Admitting: *Deleted

## 2016-04-24 ENCOUNTER — Encounter (HOSPITAL_COMMUNITY): Payer: Self-pay

## 2016-04-24 ENCOUNTER — Encounter (HOSPITAL_COMMUNITY)
Admission: RE | Admit: 2016-04-24 | Discharge: 2016-04-24 | Disposition: A | Discharge status: Home / Self Care | Payer: BLUE CROSS/BLUE SHIELD | Source: Ambulatory Visit | Attending: Cardiovascular Disease | Admitting: Cardiovascular Disease

## 2016-04-24 DIAGNOSIS — Z955 Presence of coronary angioplasty implant and graft: Secondary | ICD-10-CM | POA: Diagnosis not present

## 2016-04-24 DIAGNOSIS — Z794 Long term (current) use of insulin: Secondary | ICD-10-CM | POA: Diagnosis not present

## 2016-04-24 DIAGNOSIS — E119 Type 2 diabetes mellitus without complications: Secondary | ICD-10-CM | POA: Insufficient documentation

## 2016-04-24 DIAGNOSIS — Z6841 Body Mass Index (BMI) 40.0 and over, adult: Secondary | ICD-10-CM | POA: Diagnosis not present

## 2016-04-24 DIAGNOSIS — I2510 Atherosclerotic heart disease of native coronary artery without angina pectoris: Secondary | ICD-10-CM | POA: Diagnosis not present

## 2016-04-24 DIAGNOSIS — Z79899 Other long term (current) drug therapy: Secondary | ICD-10-CM | POA: Diagnosis not present

## 2016-04-24 DIAGNOSIS — I10 Essential (primary) hypertension: Secondary | ICD-10-CM | POA: Diagnosis not present

## 2016-04-24 DIAGNOSIS — I252 Old myocardial infarction: Secondary | ICD-10-CM | POA: Diagnosis not present

## 2016-04-24 DIAGNOSIS — G4733 Obstructive sleep apnea (adult) (pediatric): Secondary | ICD-10-CM | POA: Diagnosis not present

## 2016-04-24 DIAGNOSIS — E669 Obesity, unspecified: Secondary | ICD-10-CM | POA: Diagnosis not present

## 2016-04-24 NOTE — Progress Notes (Signed)
Cardiac Rehab Medication Review by a Pharmacist  Does the patient feel that his/her medications are working for him/her?  yes  Has the patient been experiencing any side effects to the medications prescribed?  no  Does the patient measure his/her own blood pressure or blood glucose at home?  yes   Does the patient have any problems obtaining medications due to transportation or finances?   no  Understanding of regimen: fair Understanding of indications: fair Potential of compliance: excellent    Pharmacist comments: Ms. Tignor is a 50 yo F who is doing well with her medications. She states she has only missed one dose and has excellent compliance. I explained all indications of her medications and answered all of her questions.  Casilda Carls, PharmD. PGY-1 Pharmacy Resident Pager: [phone] 04/24/2016 2:35 PM

## 2016-04-25 ENCOUNTER — Telehealth: Payer: Self-pay | Admitting: Family Medicine

## 2016-04-25 NOTE — Telephone Encounter (Signed)
Patient went for cardiac rehab yesterday. Reports generalized aches/pains in multiple joints. She questions if she has fibromyalgia. Her symptoms to be sound more OA related. We dicussed importance of regular exercise and benefits of this for generalized pain. Recently started Lyrica for diabetic nerve pain and has seen improvement. Will not make any changes in medications now however will reevaluate in a few weeks at scheduled follow up appointment.

## 2016-04-25 NOTE — Telephone Encounter (Signed)
Just wants to talk to dr fletke about some feelings she is having.  Couldn't really understand what she was saying.

## 2016-04-26 ENCOUNTER — Encounter (HOSPITAL_COMMUNITY): Payer: Self-pay

## 2016-04-26 NOTE — Progress Notes (Signed)
Cardiac Individual Treatment Plan  Patient Details  Name: [name] MRN: [id number] Date of Birth: [birth year] Referring Provider:        CARDIAC REHAB PHASE II ORIENTATION from 04/24/2016 in [facility]   Referring Provider  [name] MD      Initial Encounter Date:       CARDIAC REHAB PHASE II ORIENTATION from 04/24/2016 in [facility]   Date  04/24/16   Referring Provider  [name] MD      Visit Diagnosis: Stented coronary artery  Patient's Home Medications on Admission:  Current outpatient prescriptions:  .  acetaminophen (TYLENOL) 325 MG tablet, Take 2 tablets (650 mg total) by mouth every 6 (six) hours as needed for mild pain (or Fever >/= 101)., Disp: 60 tablet, Rfl: 0 .  albuterol (PROVENTIL HFA;VENTOLIN HFA) 108 (90 Base) MCG/ACT inhaler, Inhale 2 puffs into the lungs every 6 (six) hours as needed for wheezing or shortness of breath., Disp: 1 Inhaler, Rfl: 0 .  AMBULATORY NON FORMULARY MEDICATION, Take 90 mg by mouth 2 (two) times daily. Medication Name: Brilinta 90 mg BID (TWILIGHT Research study provided do NOT fill), Disp: , Rfl:  .  AMBULATORY NON FORMULARY MEDICATION, Take 81 mg by mouth daily. Medication Name: ASA 81 mg daily (TWILIGHT Research study provided), Disp: , Rfl:  .  amLODipine (NORVASC) 10 MG tablet, Take 1 tablet (10 mg total) by mouth daily., Disp: 30 tablet, Rfl: 2 .  atorvastatin (LIPITOR) 40 MG tablet, Take 1 tablet (40 mg total) by mouth daily at 6 PM., Disp: 90 tablet, Rfl: 0 .  blood glucose meter kit and supplies KIT, Dispense based on patient and insurance preference. Use up to four times daily as directed. (FOR ICD-9 250.00, 250.01)., Disp: 1 each, Rfl: 0 .  carvedilol (COREG) 12.5 MG tablet, Take 1 tablet (12.5 mg total) by mouth 2 (two) times daily with a meal., Disp: 60 tablet, Rfl: 2 .  docusate sodium (COLACE) 100 MG capsule, Take 1 capsule (100 mg total) by mouth every  12 (twelve) hours., Disp: 30 capsule, Rfl: 0 .  esomeprazole (NEXIUM) 40 MG capsule, Take 1 capsule (40 mg total) by mouth daily at 12 noon., Disp: 30 capsule, Rfl: 1 .  hydrochlorothiazide (MICROZIDE) 12.5 MG capsule, Take 1 capsule (12.5 mg total) by mouth daily., Disp: 30 capsule, Rfl: 2 .  Insulin Glargine (LANTUS SOLOSTAR) 100 UNIT/ML Solostar Pen, Inject 20 Units into the skin daily at 10 pm., Disp: 5 pen, Rfl: 1 .  metFORMIN (GLUCOPHAGE) 1000 MG tablet, Take 1 tablet (1,000 mg total) by mouth 2 (two) times daily with a meal., Disp: 60 tablet, Rfl: 3 .  polyethylene glycol (MIRALAX / GLYCOLAX) packet, Take 17 g by mouth daily as needed for mild constipation or moderate constipation., Disp: , Rfl:  .  pregabalin (LYRICA) 75 MG capsule, Take 1 capsule (75 mg total) by mouth 2 (two) times daily., Disp: 60 capsule, Rfl: 2 .  ranitidine (ZANTAC) 150 MG tablet, Take 1 tablet (150 mg total) by mouth 2 (two) times daily., Disp: 60 tablet, Rfl: 1 .  senna-docusate (SENOKOT-S) 8.6-50 MG tablet, Take 2 tablets by mouth daily. As needed for constipation., Disp: 60 tablet, Rfl: 1 .  sucralfate (CARAFATE) 1 g tablet, Take 1 tablet (1 g total) by mouth 4 (four) times daily -  with meals and at bedtime., Disp: 120 tablet, Rfl: 2  Past Medical History: Past Medical History  Diagnosis  Date  . Hypertension   . Flash pulmonary edema ([place]) 02/10/2016    requiring intubation  . CAD (coronary artery disease) 02/10/2016    a. 03/14/2016 Staged PCI with DES to mid RCA. b. NSTEMI 02/2016: DES to mid-LAD, POBA to 1st Diag, 85% stenosis of RCA noted with staged PCI recommended  . ARF (acute respiratory failure) ([place]) 02/10/2016  . Chronic combined systolic (congestive) and diastolic (congestive) heart failure ([place])     a. Echo 02/2016: EF 40-45% w/ Grade 2 DD. 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  . Myocardial infarction ([place]) 02/2016  . Type II diabetes mellitus ([place])   . OSA on CPAP    . Obesity (BMI 30-39.9) 04/03/2016    Tobacco Use: History  Smoking status  . Never Smoker   Smokeless tobacco  . Never Used    Labs:     Recent Review Flowsheet Data    Labs for ITP Cardiac and Pulmonary Rehab Latest Ref Rng 02/19/2016 02/19/2016 02/19/2016 02/20/2016 04/19/2016   Hemoglobin A1c - - - - - 7.5   PHART 7.350 - 7.450 - - 7.326(L) 7.433 -   PCO2ART 35.0 - 45.0 mmHg - - 58.7(HH) 41.7 -   HCO3 20.0 - 24.0 mEq/L 34.4(H) - 30.8(H) 28.0(H) -   TCO2 0 - 100 mmol/L 36 26 33 29 -   O2SAT - 97.0 - 100.0 99.0 -      Capillary Blood Glucose: Lab Results  Component Value Date   GLUCAP 250* 03/15/2016   GLUCAP 238* 03/14/2016   GLUCAP 332* 03/14/2016   GLUCAP 244* 03/14/2016   GLUCAP 216* 02/25/2016     Exercise Target Goals: Date: 04/24/16  Exercise Program Goal: Individual exercise prescription set with THRR, safety & activity barriers. Participant demonstrates ability to understand and report RPE using BORG scale, to self-measure pulse accurately, and to acknowledge the importance of the exercise prescription.  Exercise Prescription Goal: Starting with aerobic activity 30 plus minutes a day, 3 days per week for initial exercise prescription. Provide home exercise prescription and guidelines that participant acknowledges understanding prior to discharge.  Activity Barriers & Risk Stratification:     Activity Barriers & Cardiac Risk Stratification - 04/24/16 1505    Activity Barriers & Cardiac Risk Stratification   Activity Barriers Balance Concerns;History of Falls;Assistive Device;Other (comment)  Lt knee buckles, bilateral diabetic foot pain   Comments Lt knee buckles, bilateral diabetic foot pain   Cardiac Risk Stratification High      6 Minute Walk:     6 Minute Walk      04/24/16 1458       6 Minute Walk   Phase Initial     Distance 1068 feet     Walk Time 6 minutes     # of Rest Breaks 0     MPH 2.02     METS 2.69     RPE 11     VO2 Peak  9.43     Symptoms Yes (comment)     Comments Low back pain.     Resting HR 72 bpm     Resting BP 120/70 mmHg     Max Ex. HR 85 bpm     Max Ex. BP 130/70 mmHg     2 Minute Post BP 104/62 mmHg        Initial Exercise Prescription:     Initial Exercise Prescription - 04/25/16 0900    Date of Initial Exercise RX and Referring Provider  Date 04/24/16   Referring Provider [name] MD   Bike   Level 0.7   Minutes 10   METs 2.23   NuStep   Level 2   Minutes 10   METs 2.2   Track   Laps 8   Minutes 10   METs 2.39   Prescription Details   Frequency (times per week) 3   Duration Progress to 30 minutes of continuous aerobic without signs/symptoms of physical distress   Intensity   THRR 40-80% of Max Heartrate 69-138   Ratings of Perceived Exertion 11-13   Perceived Dyspnea 0-4   Progression   Progression Continue to progress workloads to maintain intensity without signs/symptoms of physical distress.   Resistance Training   Training Prescription Yes   Weight 2 lbs.   Reps 10-12      Perform Capillary Blood Glucose checks as needed.  Exercise Prescription Changes:   Exercise Comments:   Discharge Exercise Prescription (Final Exercise Prescription Changes):   Nutrition:  Target Goals: Understanding of nutrition guidelines, daily intake of sodium <1544m, cholesterol <2010m calories 30% from fat and 7% or less from saturated fats, daily to have 5 or more servings of fruits and vegetables.  Biometrics:     Pre Biometrics - 04/24/16 1506    Pre Biometrics   Height 5' 2.25" (1.581 m)   Weight 235 lb 3.7 oz (106.7 kg)   Waist Circumference 44 inches   Hip Circumference 48.25 inches   Waist to Hip Ratio 0.91 %   BMI (Calculated) 42.8   Triceps Skinfold 47.5 mm   Grip Strength 28.5 kg   Flexibility 0 in   Single Leg Stand 11.21 seconds       Nutrition Therapy Plan and Nutrition Goals:   Nutrition Discharge: Nutrition Scores:   Nutrition Goals  Re-Evaluation:   Psychosocial: Target Goals: Acknowledge presence or absence of depression, maximize coping skills, provide positive support system. Participant is able to verbalize types and ability to use techniques and skills needed for reducing stress and depression.  Initial Review & Psychosocial Screening:     Initial Psych Review & Screening - 04/24/16 1616    Family Dynamics   Good Support System? Yes   Barriers   Psychosocial barriers to participate in program The patient should benefit from training in stress management and relaxation.   Screening Interventions   Interventions Encouraged to exercise      Quality of Life Scores:     Quality of Life - 04/24/16 1458    Quality of Life Scores   Health/Function Pre 23.1 %   Socioeconomic Pre 24.38 %   Psych/Spiritual Pre 23.57 %   Family Pre 30 %   GLOBAL Pre 24.47 %      PHQ-9:     Recent Review Flowsheet Data    [facility] 04/19/2016 03/27/2016 03/01/2016 04/27/2014 12/02/2013   Decreased Interest 0 0 0  0 0   Down, Depressed, Hopeless 1 0 0 0 0   PHQ - 2 Score 1 0 0 0 0      Psychosocial Evaluation and Intervention:   Psychosocial Re-Evaluation:   Vocational Rehabilitation: Provide vocational rehab assistance to qualifying candidates.   Vocational Rehab Evaluation & Intervention:     Vocational Rehab - 04/24/16 1614    Initial Vocational Rehab Evaluation & Intervention   Assessment shows need for Vocational Rehabilitation Yes      Education: Education Goals: Education classes will be provided on a weekly basis, covering required  topics. Participant will state understanding/return demonstration of topics presented.  Learning Barriers/Preferences:     Learning Barriers/Preferences - 04/24/16 1613    Learning Barriers/Preferences   Learning Preferences Individual Instruction;Written Material;Skilled Demonstration      Education Topics: Count Your Pulse:  -Group instruction  provided by verbal instruction, demonstration, patient participation and written materials to support subject.  Instructors address importance of being able to find your pulse and how to count your pulse when at home without a heart monitor.  Patients get hands on experience counting their pulse with staff help and individually.   Heart Attack, Angina, and Risk Factor Modification:  -Group instruction provided by verbal instruction, video, and written materials to support subject.  Instructors address signs and symptoms of angina and heart attacks.    Also discuss risk factors for heart disease and how to make changes to improve heart health risk factors.   Functional Fitness:  -Group instruction provided by verbal instruction, demonstration, patient participation, and written materials to support subject.  Instructors address safety measures for doing things around the house.  Discuss how to get up and down off the floor, how to pick things up properly, how to safely get out of a chair without assistance, and balance training.   Meditation and Mindfulness:  -Group instruction provided by verbal instruction, patient participation, and written materials to support subject.  Instructor addresses importance of mindfulness and meditation practice to help reduce stress and improve awareness.  Instructor also leads participants through a meditation exercise.    Stretching for Flexibility and Mobility:  -Group instruction provided by verbal instruction, patient participation, and written materials to support subject.  Instructors lead participants through series of stretches that are designed to increase flexibility thus improving mobility.  These stretches are additional exercise for major muscle groups that are typically performed during regular warm up and cool down.   Hands Only CPR Anytime:  -Group instruction provided by verbal instruction, video, patient participation and written materials to  support subject.  Instructors co-teach with AHA video for hands only CPR.  Participants get hands on experience with mannequins.   Nutrition I class: Heart Healthy Eating:  -Group instruction provided by PowerPoint slides, verbal discussion, and written materials to support subject matter. The instructor gives an explanation and review of the Therapeutic Lifestyle Changes diet recommendations, which includes a discussion on lipid goals, dietary fat, sodium, fiber, plant stanol/sterol esters, sugar, and the components of a well-balanced, healthy diet.   Nutrition II class: Lifestyle Skills:  -Group instruction provided by PowerPoint slides, verbal discussion, and written materials to support subject matter. The instructor gives an explanation and review of label reading, grocery shopping for heart health, heart healthy recipe modifications, and ways to make healthier choices when eating out.   Diabetes Question & Answer:  -Group instruction provided by PowerPoint slides, verbal discussion, and written materials to support subject matter. The instructor gives an explanation and review of diabetes co-morbidities, pre- and post-prandial blood glucose goals, pre-exercise blood glucose goals, signs, symptoms, and treatment of hypoglycemia and hyperglycemia, and foot care basics.   Diabetes Blitz:  -Group instruction provided by PowerPoint slides, verbal discussion, and written materials to support subject matter. The instructor gives an explanation and review of the physiology behind type 1 and type 2 diabetes, diabetes medications and rational behind using different medications, pre- and post-prandial blood glucose recommendations and Hemoglobin A1c goals, diabetes diet, and exercise including blood glucose guidelines for exercising safely.    Portion Distortion:  -  Group instruction provided by PowerPoint slides, verbal discussion, written materials, and food models to support subject matter. The  instructor gives an explanation of serving size versus portion size, changes in portions sizes over the last 20 years, and what consists of a serving from each food group.   Stress Management:  -Group instruction provided by verbal instruction, video, and written materials to support subject matter.  Instructors review role of stress in heart disease and how to cope with stress positively.     Exercising on Your Own:  -Group instruction provided by verbal instruction, power point, and written materials to support subject.  Instructors discuss benefits of exercise, components of exercise, frequency and intensity of exercise, and end points for exercise.  Also discuss use of nitroglycerin and activating EMS.  Review options of places to exercise outside of rehab.  Review guidelines for sex with heart disease.   Cardiac Drugs I:  -Group instruction provided by verbal instruction and written materials to support subject.  Instructor reviews cardiac drug classes: antiplatelets, anticoagulants, beta blockers, and statins.  Instructor discusses reasons, side effects, and lifestyle considerations for each drug class.   Cardiac Drugs II:  -Group instruction provided by verbal instruction and written materials to support subject.  Instructor reviews cardiac drug classes: angiotensin converting enzyme inhibitors (ACE-I), angiotensin II receptor blockers (ARBs), nitrates, and calcium channel blockers.  Instructor discusses reasons, side effects, and lifestyle considerations for each drug class.   Anatomy and Physiology of the Circulatory System:  -Group instruction provided by verbal instruction, video, and written materials to support subject.  Reviews functional anatomy of heart, how it relates to various diagnoses, and what role the heart plays in the overall system.   Knowledge Questionnaire Score:     Knowledge Questionnaire Score - 04/24/16 1458    Knowledge Questionnaire Score   Pre Score  20/24      Core Components/Risk Factors/Patient Goals at Admission:     Personal Goals and Risk Factors at Admission - 04/24/16 1614    Core Components/Risk Factors/Patient Goals on Admission    Weight Management Obesity;Yes   Intervention Weight Management: Develop a combined nutrition and exercise program designed to reach desired caloric intake, while maintaining appropriate intake of nutrient and fiber, sodium and fats, and appropriate energy expenditure required for the weight goal.;Obesity: Provide education and appropriate resources to help participant work on and attain dietary goals.;Weight Management/Obesity: Establish reasonable short term and long term weight goals.   Admit Weight 235 lb 3.7 oz (106.7 kg)   Expected Outcomes Short Term: Continue to assess and modify interventions until short term weight is achieved;Long Term: Adherence to nutrition and physical activity/exercise program aimed toward attainment of established weight goal;Weight Loss: Understanding of general recommendations for a balanced deficit meal plan, which promotes 1-2 lb weight loss per week and includes a negative energy balance of [id number] kcal/d;Understanding recommendations for meals to include 15-35% energy as protein, 25-35% energy from fat, 35-60% energy from carbohydrates, less than 222m of dietary cholesterol, 20-35 gm of total fiber daily   Sedentary Yes   Intervention Provide advice, education, support and counseling about physical activity/exercise needs.;Develop an individualized exercise prescription for aerobic and resistive training based on initial evaluation findings, risk stratification, comorbidities and participant's personal goals.   Expected Outcomes Achievement of increased cardiorespiratory fitness and enhanced flexibility, muscular endurance and strength shown through measurements of functional capacity and personal statement of participant.      Core Components/Risk Factors/Patient  Goals Review:  Core Components/Risk Factors/Patient Goals at Discharge (Final Review):    ITP Comments:     ITP Comments      04/24/16 1609           ITP Comments [name], Medical Director           Comments: Patient attended orientation from 1330 to 1530 to review rules and guidelines for program. Completed 6 minute walk test, Intitial ITP, and exercise prescription.  VSS. Telemetry-Sinus rhythm with t wave inversion this has been previously documented. Evolett did  Report experiencing  some lower back pain during her walk test. Cherie is interested in vocational rehabilitation. We will give Darianny a vocational rehab packet. Will continue to monitor the patient throughout  the program.

## 2016-04-30 ENCOUNTER — Telehealth: Payer: Self-pay | Admitting: Cardiology

## 2016-04-30 ENCOUNTER — Encounter: Payer: Self-pay | Admitting: Physician Assistant

## 2016-04-30 ENCOUNTER — Encounter (HOSPITAL_COMMUNITY)
Admission: RE | Admit: 2016-04-30 | Discharge: 2016-04-30 | Disposition: A | Discharge status: Home / Self Care | Payer: BLUE CROSS/BLUE SHIELD | Source: Ambulatory Visit | Attending: Cardiovascular Disease | Admitting: Cardiovascular Disease

## 2016-04-30 ENCOUNTER — Encounter (HOSPITAL_COMMUNITY): Payer: BLUE CROSS/BLUE SHIELD

## 2016-04-30 DIAGNOSIS — Z955 Presence of coronary angioplasty implant and graft: Secondary | ICD-10-CM | POA: Diagnosis not present

## 2016-04-30 LAB — GLUCOSE, CAPILLARY
GLUCOSE-CAPILLARY: 264 mg/dL — AB (ref 65–99)
Glucose-Capillary: 227 mg/dL — ABNORMAL HIGH (ref 65–99)

## 2016-04-30 NOTE — Telephone Encounter (Signed)
Spoke with pt. She states she started cardiac rehab today.  She is very tired after this and is not sure she can work and go to rehab.  Her legs are very sore after doing cardiac rehab. She is scheduled to resume work on July 1,2017. She works at [employer] and stands a lot at work.  Her employer is giving her time off to attend cardiac rehab but pt feels she needs time to adjust to rehab and get leg strength. She feels like she needs to stay out of work until the end of July.  Her disability ends when she resumes work.  She is asking if letter could be written to her employer keeping her out of work until the end of July.  Pt's primary cardiologist is Dr. Kirke Corin and he is not in office. Will review with Tereso Newcomer, PA who last saw pt.

## 2016-04-30 NOTE — Telephone Encounter (Signed)
I spoke with pt and told her letter has been written. She would like to pick this up in the office. Letter left at front desk for pt.

## 2016-04-30 NOTE — Telephone Encounter (Signed)
Ok to keep out of work until the end of July. I will write note in her chart. Tereso Newcomer, PA-C   04/30/2016 4:47 PM

## 2016-04-30 NOTE — Telephone Encounter (Signed)
New Message  Pt calling to speak w/ rN about her cardac rehab and when she might be able to start working again.Please call back and discuss.

## 2016-04-30 NOTE — Progress Notes (Signed)
Daily Session Note  Patient Details  Name: [name] MRN: [id number] Date of Birth: 1965-12-05 Referring Provider:        CARDIAC REHAB PHASE II ORIENTATION from 04/24/2016 in [name]   Referring Provider  [name] MD      Encounter Date: 04/30/2016  Check In:     Session Check In - 04/30/16 1006    Check-In   Location [facility]   Staff Present [name], MS, ACSM RCEP, Exercise Physiologist;[name], RN, BSN;[name], RN, BSN   Supervising physician immediately available to respond to emergencies [facility] immediately available   Physician(s) Dr. Marily Memos    Medication changes reported     No   Fall or balance concerns reported    No   Warm-up and Cool-down Performed as group-led instruction   Resistance Training Performed Yes   Pain Assessment   Currently in Pain? No/denies      Capillary Blood Glucose: Results for orders placed or performed during the hospital encounter of 04/30/16 (from the past 24 hour(s))  Glucose, capillary     Status: Abnormal   Collection Time: 04/30/16  9:50 AM  Result Value Ref Range   Glucose-Capillary 227 (H) 65 - 99 mg/dL  Glucose, capillary     Status: Abnormal   Collection Time: 04/30/16 10:38 AM  Result Value Ref Range   Glucose-Capillary 264 (H) 65 - 99 mg/dL     Goals Met:  No report of cardiac concerns or symptoms  Goals Unmet:  Not Applicable  Comments: Pt started cardiac rehab today.  Pt tolerated light exercise without difficulty. VSS, telemetry-Sinus with inverted T wave, asymptomatic.  Medication list reconciled. Pt denies barriers to medicaiton compliance.  PSYCHOSOCIAL ASSESSMENT:  PHQ-0. Pt exhibits positive coping skills, hopeful outlook with supportive family. No psychosocial needs identified at this time, no psychosocial interventions necessary.    Pt enjoys watching soap operas on TV.   Pt oriented to exercise equipment and routine.     Understanding verbalized. [name] was given a vocational rehab packet to review and complete. Patient encouraged to take rest breaks as needed. Max blood pressure noted at 162/82. [name] is deconditioned but is looking forward to participating in the program. Will continue to monitor the patient throughout  the program.     [name] is Medical Director for Cardiac Rehab at [facility].

## 2016-05-02 ENCOUNTER — Encounter (HOSPITAL_COMMUNITY): Payer: BLUE CROSS/BLUE SHIELD

## 2016-05-02 ENCOUNTER — Encounter (HOSPITAL_COMMUNITY)
Admission: RE | Admit: 2016-05-02 | Discharge: 2016-05-02 | Disposition: A | Discharge status: Home / Self Care | Payer: BLUE CROSS/BLUE SHIELD | Source: Ambulatory Visit | Attending: Cardiovascular Disease | Admitting: Cardiovascular Disease

## 2016-05-02 DIAGNOSIS — Z955 Presence of coronary angioplasty implant and graft: Secondary | ICD-10-CM

## 2016-05-02 LAB — GLUCOSE, CAPILLARY
GLUCOSE-CAPILLARY: 265 mg/dL — AB (ref 65–99)
Glucose-Capillary: 296 mg/dL — ABNORMAL HIGH (ref 65–99)

## 2016-05-04 ENCOUNTER — Encounter (HOSPITAL_COMMUNITY): Payer: BLUE CROSS/BLUE SHIELD

## 2016-05-04 ENCOUNTER — Encounter (HOSPITAL_COMMUNITY)
Admission: RE | Admit: 2016-05-04 | Discharge: 2016-05-04 | Disposition: A | Discharge status: Home / Self Care | Payer: BLUE CROSS/BLUE SHIELD | Source: Ambulatory Visit | Attending: Cardiovascular Disease | Admitting: Cardiovascular Disease

## 2016-05-04 DIAGNOSIS — Z955 Presence of coronary angioplasty implant and graft: Secondary | ICD-10-CM

## 2016-05-04 LAB — GLUCOSE, CAPILLARY: GLUCOSE-CAPILLARY: 221 mg/dL — AB (ref 65–99)

## 2016-05-07 ENCOUNTER — Telehealth: Payer: Self-pay | Admitting: Family Medicine

## 2016-05-07 ENCOUNTER — Encounter (HOSPITAL_COMMUNITY)
Admission: RE | Admit: 2016-05-07 | Discharge: 2016-05-07 | Disposition: A | Discharge status: Home / Self Care | Payer: BLUE CROSS/BLUE SHIELD | Source: Ambulatory Visit | Attending: Cardiovascular Disease | Admitting: Cardiovascular Disease

## 2016-05-07 ENCOUNTER — Encounter (HOSPITAL_COMMUNITY): Payer: BLUE CROSS/BLUE SHIELD

## 2016-05-07 DIAGNOSIS — Z955 Presence of coronary angioplasty implant and graft: Secondary | ICD-10-CM | POA: Insufficient documentation

## 2016-05-07 DIAGNOSIS — I2510 Atherosclerotic heart disease of native coronary artery without angina pectoris: Secondary | ICD-10-CM | POA: Insufficient documentation

## 2016-05-07 DIAGNOSIS — I252 Old myocardial infarction: Secondary | ICD-10-CM | POA: Insufficient documentation

## 2016-05-07 DIAGNOSIS — E669 Obesity, unspecified: Secondary | ICD-10-CM | POA: Insufficient documentation

## 2016-05-07 DIAGNOSIS — Z79899 Other long term (current) drug therapy: Secondary | ICD-10-CM | POA: Insufficient documentation

## 2016-05-07 DIAGNOSIS — G4733 Obstructive sleep apnea (adult) (pediatric): Secondary | ICD-10-CM | POA: Insufficient documentation

## 2016-05-07 DIAGNOSIS — Z6841 Body Mass Index (BMI) 40.0 and over, adult: Secondary | ICD-10-CM | POA: Insufficient documentation

## 2016-05-07 DIAGNOSIS — I10 Essential (primary) hypertension: Secondary | ICD-10-CM | POA: Insufficient documentation

## 2016-05-07 DIAGNOSIS — Z794 Long term (current) use of insulin: Secondary | ICD-10-CM | POA: Insufficient documentation

## 2016-05-07 DIAGNOSIS — E119 Type 2 diabetes mellitus without complications: Secondary | ICD-10-CM | POA: Insufficient documentation

## 2016-05-07 LAB — GLUCOSE, CAPILLARY
Glucose-Capillary: 205 mg/dL — ABNORMAL HIGH (ref 65–99)
Glucose-Capillary: 350 mg/dL — ABNORMAL HIGH (ref 65–99)

## 2016-05-07 NOTE — Telephone Encounter (Signed)
Received call from Cardiac Rehab. Patient blood sugar 350. Unable to work out per protocol.   Spoke with patient. Feels well. Fasting blood sugars have mostly been in the 200's over the past week. Has upcoming appointment. Encouraged her to continue to check blood sugars at home. Will adjust insulin at next appointment.

## 2016-05-07 NOTE — Progress Notes (Signed)
Incomplete Session Note  Patient Details  Name: [name] MRN: [id number] Date of Birth: [birth year] Referring Provider:        CARDIAC REHAB PHASE II ORIENTATION from 04/24/2016 in [facility]   Referring Provider  Lorine Bears MD      Val Riles did not complete her rehab session.  [name]'s blood sugar is 350 this AM. No exercise per protocol.  The patient said she ate at a cook out  Yesterday. Patient ate oatmeal this morning and took insulin and metformin as prescribed. Dr Versie Starks office called and notified.Dr Randolm Idol talked [name] over the phone. No new orders received. [name] plans to return to exercise on Wednesday. I asked [name] to check her blood sugar prior to coming to exercise. Patient states understanding.

## 2016-05-09 ENCOUNTER — Encounter (HOSPITAL_COMMUNITY)
Admission: RE | Admit: 2016-05-09 | Discharge: 2016-05-09 | Disposition: A | Discharge status: Home / Self Care | Payer: BLUE CROSS/BLUE SHIELD | Source: Ambulatory Visit | Attending: Cardiovascular Disease | Admitting: Cardiovascular Disease

## 2016-05-09 ENCOUNTER — Telehealth: Payer: Self-pay | Admitting: Physician Assistant

## 2016-05-09 ENCOUNTER — Ambulatory Visit (INDEPENDENT_AMBULATORY_CARE_PROVIDER_SITE_OTHER): Payer: BLUE CROSS/BLUE SHIELD | Admitting: Gastroenterology

## 2016-05-09 ENCOUNTER — Encounter: Payer: Self-pay | Admitting: Gastroenterology

## 2016-05-09 ENCOUNTER — Encounter (HOSPITAL_COMMUNITY): Payer: BLUE CROSS/BLUE SHIELD

## 2016-05-09 VITALS — BP 130/76 | HR 84 | Ht 62.5 in | Wt 231.8 lb

## 2016-05-09 DIAGNOSIS — R1310 Dysphagia, unspecified: Secondary | ICD-10-CM

## 2016-05-09 DIAGNOSIS — I10 Essential (primary) hypertension: Secondary | ICD-10-CM | POA: Diagnosis not present

## 2016-05-09 DIAGNOSIS — Z955 Presence of coronary angioplasty implant and graft: Secondary | ICD-10-CM

## 2016-05-09 DIAGNOSIS — I5042 Chronic combined systolic (congestive) and diastolic (congestive) heart failure: Secondary | ICD-10-CM

## 2016-05-09 DIAGNOSIS — G4733 Obstructive sleep apnea (adult) (pediatric): Secondary | ICD-10-CM

## 2016-05-09 DIAGNOSIS — I252 Old myocardial infarction: Secondary | ICD-10-CM | POA: Diagnosis not present

## 2016-05-09 DIAGNOSIS — Z794 Long term (current) use of insulin: Secondary | ICD-10-CM | POA: Diagnosis not present

## 2016-05-09 DIAGNOSIS — Z79899 Other long term (current) drug therapy: Secondary | ICD-10-CM | POA: Diagnosis not present

## 2016-05-09 DIAGNOSIS — R05 Cough: Secondary | ICD-10-CM

## 2016-05-09 DIAGNOSIS — I2510 Atherosclerotic heart disease of native coronary artery without angina pectoris: Secondary | ICD-10-CM

## 2016-05-09 DIAGNOSIS — R059 Cough, unspecified: Secondary | ICD-10-CM

## 2016-05-09 DIAGNOSIS — E119 Type 2 diabetes mellitus without complications: Secondary | ICD-10-CM | POA: Diagnosis not present

## 2016-05-09 DIAGNOSIS — Z6841 Body Mass Index (BMI) 40.0 and over, adult: Secondary | ICD-10-CM | POA: Diagnosis not present

## 2016-05-09 DIAGNOSIS — E669 Obesity, unspecified: Secondary | ICD-10-CM | POA: Diagnosis not present

## 2016-05-09 LAB — GLUCOSE, CAPILLARY: Glucose-Capillary: 232 mg/dL — ABNORMAL HIGH (ref 65–99)

## 2016-05-09 NOTE — Telephone Encounter (Signed)
Walk In pt Form-[place] paper-dropped off gave to Coy Saunas

## 2016-05-09 NOTE — Patient Instructions (Signed)
You have been scheduled for a Barium Esophogram at [facility] (1st floor of the hospital) on 05-29-2016 at 11:30am. Please arrive 15 minutes prior to your appointment for registration. Make certain not to have anything to eat or drink 6 hours prior to your test. If you need to reschedule for any reason, please contact radiology at [phone] to do so. __________________________________________________________________ A barium swallow is an examination that concentrates on views of the esophagus. This tends to be a double contrast exam (barium and two liquids which, when combined, create a gas to distend the wall of the oesophagus) or single contrast (non-ionic iodine based). The study is usually tailored to your symptoms so a good history is essential. Attention is paid during the study to the form, structure and configuration of the esophagus, looking for functional disorders (such as aspiration, dysphagia, achalasia, motility and reflux) EXAMINATION You may be asked to change into a gown, depending on the type of swallow being performed. A radiologist and radiographer will perform the procedure. The radiologist will advise you of the type of contrast selected for your procedure and direct you during the exam. You will be asked to stand, sit or lie in several different positions and to hold a small amount of fluid in your mouth before being asked to swallow while the imaging is performed .In some instances you may be asked to swallow barium coated marshmallows to assess the motility of a solid food bolus. The exam can be recorded as a digital or video fluoroscopy procedure. POST PROCEDURE It will take 1-2 days for the barium to pass through your system. To facilitate this, it is important, unless otherwise directed, to increase your fluids for the next 24-48hrs and to resume your normal diet.  This test typically takes about 30 minutes to  perform. __________________________________________________________________________________  If you are age 21 or older, your body mass index should be between 23-30. Your Body mass index is 41.69 kg/(m^2). If this is out of the aforementioned range listed, please consider follow up with your Primary Care Provider.  If you are age 72 or younger, your body mass index should be between 19-25. Your Body mass index is 41.69 kg/(m^2). If this is out of the aformentioned range listed, please consider follow up with your Primary Care Provider.   Dr Lucky Rathke office number [phone]  Thank you for choosing [facility]  Dr Amada Jupiter III

## 2016-05-09 NOTE — Progress Notes (Signed)
[facility] Consult Note:  History: [name] 05/09/2016  Referring physician: Lupita Dawn, MD  Reason for consult/chief complaint: Dysphagia and Cough   Subjective HPI:  This is a 50 year old woman with multiple medical problems as outlined below referred by pulmonary for ongoing dysphagia. Records indicate that she was admitted for an acute MI in April 2017 and had a prolonged intubation. She underwent placement of a drug-eluting stent in the right coronary artery and is on dual antiplatelet therapy with aspirin and Brilinta.  Shortly after that, she was in the ED for respiratory distress and stridor, and I reviewed an ENT evaluation note by Dr. Arville Care. She had subglottic edema and a possible early stenosis, CT scan done at that time is as noted below. Since then she has had chronic hoarseness with cough and oropharyngeal dysphagia where sometimes eating will make her cough and things will feel stuck in the neck. She had no trouble with dysphagia prior to to the MI and intubation.   ROS:  Review of Systems  Constitutional: Positive for fatigue. Negative for appetite change and unexpected weight change.  HENT: Negative for mouth sores and voice change.   Eyes: Negative for pain and redness.  Respiratory: Positive for cough. Negative for shortness of breath.   Cardiovascular: Positive for leg swelling. Negative for chest pain and palpitations.  Genitourinary: Negative for dysuria and hematuria.  Musculoskeletal: Positive for arthralgias. Negative for myalgias.  Skin: Negative for pallor and rash.  Neurological: Negative for weakness and headaches.  Hematological: Negative for adenopathy.     Past Medical History: Past Medical History  Diagnosis Date  . Hypertension   . Flash pulmonary edema ([place]) 02/10/2016    requiring intubation  . CAD (coronary artery disease) 02/10/2016    a. 03/14/2016 Staged PCI with DES to mid RCA. b. NSTEMI 02/2016: DES to  mid-LAD, POBA to 1st Diag, 85% stenosis of RCA noted with staged PCI recommended  . ARF (acute respiratory failure) ([place]) 02/10/2016  . Chronic combined systolic (congestive) and diastolic (congestive) heart failure ([place])     a. Echo 02/2016: EF 40-45% w/ Grade 2 DD. 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  . Myocardial infarction ([place]) 02/2016  . Type II diabetes mellitus ([place])   . OSA on CPAP   . Obesity (BMI 30-39.9) 04/03/2016   Reviewed last cardiology note 04/03/16.  DES to the RCA 02/2016  Past Surgical History: Past Surgical History  Procedure Laterality Date  . Anterior cervical decomp/discectomy fusion  08/28/2012    Procedure: ANTERIOR CERVICAL DECOMPRESSION/DISCECTOMY FUSION 2 LEVELS;  Surgeon: Otilio Connors, MD;  Location: [place] [facility];  Service: Neurosurgery;  Laterality: N/A;  Cervical four-five, cervical six-seven Anterior cervical decompression/diskectomy/fusion/LifeNet Bone/Trestle plate  . Back surgery    . Breast cyst excision Right   . Cardiac catheterization N/A 02/15/2016    Procedure: Left Heart Cath and Coronary Angiography;  Surgeon: Wellington Hampshire, MD;  Location: [place] CV LAB;  Service: Cardiovascular;  Laterality: N/A;  . Cardiac catheterization N/A 02/15/2016    Procedure: Coronary Stent Intervention;  Surgeon: Wellington Hampshire, MD;  Location: [place] CV LAB;  Service: Cardiovascular;  Laterality: N/A;  . Cardiac catheterization N/A 02/15/2016    Procedure: Coronary Balloon Angioplasty;  Surgeon: Wellington Hampshire, MD;  Location: [place] CV LAB;  Service: Cardiovascular;  Laterality: N/A;  . Coronary angioplasty with stent placement    . Cardiac catheterization N/A 03/14/2016    Procedure:  Coronary Stent Intervention;  Surgeon: Wellington Hampshire, MD;  Location: [place] CV LAB;  Service: Cardiovascular;  Laterality: N/A;  . Split night study  04/07/2016     Family History: Family History  Problem Relation Age of Onset   . Heart disease Paternal Grandmother 30  . Diabetes Paternal Grandmother   . Hypertension Paternal Grandmother   . Heart disease Paternal Aunt   . Cancer Neg Hx   . Stroke Neg Hx   . Alcohol abuse Neg Hx   . Heart disease Father 73    MI    Social History: Social History   Social History  . Marital Status: Married    Spouse Name: N/A  . Number of Children: 1  . Years of Education: N/A   Social History Main Topics  . Smoking status: Never Smoker   . Smokeless tobacco: Never Used  . Alcohol Use: Yes     Comment: 03/14/2016 "I'll have a couple drinks on major holidays"  . Drug Use: Yes    Special: Marijuana     Comment: 03/14/2016 "last use was in 02/2016"  . Sexual Activity: Not Currently    Birth Control/ Protection: None   Other Topics Concern  . None   Social History Narrative   Lives with husband.  Has one daughter.  Work full time at [employer].   Some high school education.        Mother's history is unknown patient raised by paternal grandmother     Allergies: Allergies  Allergen Reactions  . Ace Inhibitors Swelling    Outpatient Meds: Current Outpatient Prescriptions  Medication Sig Dispense Refill  . acetaminophen (TYLENOL) 325 MG tablet Take 2 tablets (650 mg total) by mouth every 6 (six) hours as needed for mild pain (or Fever >/= 101). 60 tablet 0  . albuterol (PROVENTIL HFA;VENTOLIN HFA) 108 (90 Base) MCG/ACT inhaler Inhale 2 puffs into the lungs every 6 (six) hours as needed for wheezing or shortness of breath. 1 Inhaler 0  . amLODipine (NORVASC) 10 MG tablet Take 1 tablet (10 mg total) by mouth daily. 30 tablet 2  . aspirin 81 MG tablet Take 81 mg by mouth daily.    Marland Kitchen atorvastatin (LIPITOR) 40 MG tablet Take 1 tablet (40 mg total) by mouth daily at 6 PM. 90 tablet 0  . blood glucose meter kit and supplies KIT Dispense based on patient and insurance preference. Use up to four times daily as directed. (FOR ICD-9 250.00, 250.01). 1 each 0  .  carvedilol (COREG) 12.5 MG tablet Take 1 tablet (12.5 mg total) by mouth 2 (two) times daily with a meal. 60 tablet 2  . docusate sodium (COLACE) 100 MG capsule Take 1 capsule (100 mg total) by mouth every 12 (twelve) hours. 30 capsule 0  . esomeprazole (NEXIUM) 40 MG capsule Take 1 capsule (40 mg total) by mouth daily at 12 noon. 30 capsule 1  . hydrochlorothiazide (MICROZIDE) 12.5 MG capsule Take 1 capsule (12.5 mg total) by mouth daily. 30 capsule 2  . Insulin Glargine (LANTUS SOLOSTAR) 100 UNIT/ML Solostar Pen Inject 20 Units into the skin daily at 10 pm. 5 pen 1  . metFORMIN (GLUCOPHAGE) 1000 MG tablet Take 1 tablet (1,000 mg total) by mouth 2 (two) times daily with a meal. 60 tablet 3  . polyethylene glycol (MIRALAX / GLYCOLAX) packet Take 17 g by mouth daily as needed for mild constipation or moderate constipation.    . pregabalin (LYRICA) 75 MG  capsule Take 1 capsule (75 mg total) by mouth 2 (two) times daily. 60 capsule 2  . senna-docusate (SENOKOT-S) 8.6-50 MG tablet Take 2 tablets by mouth daily. As needed for constipation. 60 tablet 1  . sucralfate (CARAFATE) 1 g tablet Take 1 tablet (1 g total) by mouth 4 (four) times daily -  with meals and at bedtime. 120 tablet 2  . ticagrelor (BRILINTA) 90 MG TABS tablet Take 90 mg by mouth 2 (two) times daily.     No current facility-administered medications for this visit.     ___________________________________________________________________ Objective  Exam:  BP 130/76 mmHg  Pulse 84  Ht 5' 2.5" (1.588 m)  Wt 231 lb 12.8 oz (105.144 kg)  BMI 41.69 kg/m2  LMP 04/22/2016 (Approximate)   General: this is a(n) chronically ill-appearing woman who looks older than stated age .  Eyes: sclera anicteric, no redness  ENT: oral mucosa moist without lesions, no cervical or supraclavicular lymphadenopathy, fair dentition. Her voice is hoarse  CV: RRR without murmur, S1/S2, no JVD, no peripheral edema  Resp: clear to auscultation  bilaterally, normal RR and effort noted  GI: Obese, soft, no tenderness, with active bowel sounds. No guarding or palpable organomegaly noted.  Skin; warm and dry, no rash or jaundice noted  Neuro: awake, alert and oriented x 3. Normal gross motor function and fluent speech  Labs:  Hgb 9.2 on 02/15/16, then 7.5 on 04/19/16  Radiologic Studies:  CT scan soft tissue neck /16/17 :  Diffuse subglottic tracheal edema extending over 3 cm segment, beginning 3 cm below the true cords, with circumferential wall narrowing, and fibrinous type webs. Estimated luminal narrowing could be up to 50%  Assessment: Encounter Diagnoses  Name Primary?  Marland Kitchen Dysphagia Yes  . Cough   . Chronic combined systolic (congestive) and diastolic (congestive) heart failure ([place])   . CAD, multiple vessel   . OSA (obstructive sleep apnea)     This appears all due to upper airway edema and possible vocal cord oropharyngeal muscle dysfunction as a result of her prolonged intubation. Think it is less likely to be an underlying structural esophageal lesion, since all of this began with the episode in April. Upper airway edema /dysfunction ? VC dysfunction Unllikely structural esophageal lesion, since all began after this episode  Plan:  Barium swallow. She cannot have upper endoscopy with any dilation at this point since she is on dual antiplatelet therapy for at least the next 9 months. We have given her the office number for Dr. Constance Holster so she can be reevaluated in the office with a fiberoptic exam.  Thank you for the courtesy of this consult.  Please call me with any questions or concerns.  Nelida Meuse III  CC: Lupita Dawn, MD Christinia Gully, MD (Pulmonary)

## 2016-05-11 ENCOUNTER — Encounter (HOSPITAL_COMMUNITY)
Admission: RE | Admit: 2016-05-11 | Discharge: 2016-05-11 | Disposition: A | Discharge status: Home / Self Care | Payer: BLUE CROSS/BLUE SHIELD | Source: Ambulatory Visit | Attending: Cardiovascular Disease | Admitting: Cardiovascular Disease

## 2016-05-11 ENCOUNTER — Encounter (HOSPITAL_COMMUNITY): Payer: BLUE CROSS/BLUE SHIELD

## 2016-05-11 DIAGNOSIS — Z955 Presence of coronary angioplasty implant and graft: Secondary | ICD-10-CM

## 2016-05-11 LAB — GLUCOSE, CAPILLARY: GLUCOSE-CAPILLARY: 195 mg/dL — AB (ref 65–99)

## 2016-05-14 ENCOUNTER — Encounter (HOSPITAL_COMMUNITY): Payer: BLUE CROSS/BLUE SHIELD

## 2016-05-14 ENCOUNTER — Encounter (HOSPITAL_COMMUNITY)
Admission: RE | Admit: 2016-05-14 | Discharge: 2016-05-14 | Disposition: A | Discharge status: Home / Self Care | Payer: BLUE CROSS/BLUE SHIELD | Source: Ambulatory Visit | Attending: Cardiovascular Disease | Admitting: Cardiovascular Disease

## 2016-05-14 DIAGNOSIS — Z955 Presence of coronary angioplasty implant and graft: Secondary | ICD-10-CM | POA: Diagnosis not present

## 2016-05-14 LAB — GLUCOSE, CAPILLARY: GLUCOSE-CAPILLARY: 221 mg/dL — AB (ref 65–99)

## 2016-05-15 ENCOUNTER — Encounter: Payer: Self-pay | Admitting: Cardiovascular Disease

## 2016-05-15 ENCOUNTER — Ambulatory Visit (INDEPENDENT_AMBULATORY_CARE_PROVIDER_SITE_OTHER): Payer: BLUE CROSS/BLUE SHIELD | Admitting: Cardiovascular Disease

## 2016-05-15 VITALS — BP 139/77 | HR 75 | Ht 62.5 in | Wt 229.0 lb

## 2016-05-15 DIAGNOSIS — I2510 Atherosclerotic heart disease of native coronary artery without angina pectoris: Secondary | ICD-10-CM | POA: Diagnosis not present

## 2016-05-15 DIAGNOSIS — Z79899 Other long term (current) drug therapy: Secondary | ICD-10-CM | POA: Diagnosis not present

## 2016-05-15 DIAGNOSIS — E785 Hyperlipidemia, unspecified: Secondary | ICD-10-CM | POA: Diagnosis not present

## 2016-05-15 NOTE — Progress Notes (Signed)
Cardiology Office Note   Date:  05/15/2016   ID:  [name], DOB December 15, 1965, MRN [id number]  PCP:  Lupita Dawn, MD  Cardiologist:   [name], MD   Chief Complaint  Patient presents with  . Follow-up    SOB;occasionally. LIGHTHEADED; occasionally. EDEMA; in legs and feet when standing period of time.      History of Present Illness: [name] is a 50 y.o. female who presents for  A follow-up visit regarding coronary artery disease and chronic systolic heart failure. She has known history of hypertension, type 2 diabetes and obesity. She was hospitalized on April 14 at [facility] with respiratory failure due to acute systolic heart failure with non-ST elevation myocardial infarction. Peak troponin was 7. Echocardiogram showed an ejection fraction of 40-45% with apical and inferoapical akinesis. There was a questionable mass on the mitral valve which was ruled out by TEE. She underwent cardiac catheterization during her hospitalization which showed severe two-vessel coronary artery disease with 99% mid to distal LAD stenosis, 99% ostial first diagonal stenosis and 85% mid RCA stenosis. She underwent successful balloon angioplasty of the first diagonal and drug-eluting stent placement to the mid to distal LAD. She developed left bundle branch block when the left ventricle was engaged and this persisted throughout her hospitalization. She was subsequently discharged home but returned with respiratory distress and stridor which was found to be due to subglottic edema. She was reintubated. This was felt to be due to her recent intubation and possibly due to ACE inhibitor use. She was discharged home on prednisone. I proceeded with staged RCA PCI and drug-eluting stent placement in May without complications. She has been doing well and has been attending cardiac rehabilitation with no recurrent chest pain. She has chronic exertional dyspnea with no recent worsening. She was diagnosed  with sleep apnea and is going for CPAP titration later this month. Her biggest issue continues to be in cough in spite of treatment for presumed GERD. She is suspected of having vocal cord problems from intubation. She has a follow-up appointment with ENT. She is planning to go back to work on July 30.   Past Medical History  Diagnosis Date  . Hypertension   . Flash pulmonary edema ([place]) 02/10/2016    requiring intubation  . CAD (coronary artery disease) 02/10/2016    a. 03/14/2016 Staged PCI with DES to mid RCA. b. NSTEMI 02/2016: DES to mid-LAD, POBA to 1st Diag, 85% stenosis of RCA noted with staged PCI recommended  . ARF (acute respiratory failure) ([place]) 02/10/2016  . Chronic combined systolic (congestive) and diastolic (congestive) heart failure ([place])     a. Echo 02/2016: EF 40-45% w/ Grade 2 DD. 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  . Myocardial infarction ([place]) 02/2016  . Type II diabetes mellitus ([place])   . OSA on CPAP   . Obesity (BMI 30-39.9) 04/03/2016    Past Surgical History  Procedure Laterality Date  . Anterior cervical decomp/discectomy fusion  08/28/2012    Procedure: ANTERIOR CERVICAL DECOMPRESSION/DISCECTOMY FUSION 2 LEVELS;  Surgeon: Otilio Connors, MD;  Location: [place] [facility];  Service: Neurosurgery;  Laterality: N/A;  Cervical four-five, cervical six-seven Anterior cervical decompression/diskectomy/fusion/LifeNet Bone/Trestle plate  . Back surgery    . Breast cyst excision Right   . Cardiac catheterization N/A 02/15/2016    Procedure: Left Heart Cath and Coronary Angiography;  Surgeon: Wellington Hampshire, MD;  Location: [place] [facility];  Service: Cardiovascular;  Laterality: N/A;  . Cardiac catheterization N/A 02/15/2016    Procedure: Coronary Stent Intervention;  Surgeon: Wellington Hampshire, MD;  Location: [place] CV LAB;  Service: Cardiovascular;  Laterality: N/A;  . Cardiac catheterization N/A 02/15/2016    Procedure: Coronary Balloon  Angioplasty;  Surgeon: Wellington Hampshire, MD;  Location: [place] CV LAB;  Service: Cardiovascular;  Laterality: N/A;  . Coronary angioplasty with stent placement    . Cardiac catheterization N/A 03/14/2016    Procedure: Coronary Stent Intervention;  Surgeon: Wellington Hampshire, MD;  Location: [place] [facility];  Service: Cardiovascular;  Laterality: N/A;  . Split night study  04/07/2016     Current Outpatient Prescriptions  Medication Sig Dispense Refill  . acetaminophen (TYLENOL) 325 MG tablet Take 2 tablets (650 mg total) by mouth every 6 (six) hours as needed for mild pain (or Fever >/= 101). 60 tablet 0  . albuterol (PROVENTIL HFA;VENTOLIN HFA) 108 (90 Base) MCG/ACT inhaler Inhale 2 puffs into the lungs every 6 (six) hours as needed for wheezing or shortness of breath. 1 Inhaler 0  . amLODipine (NORVASC) 10 MG tablet Take 1 tablet (10 mg total) by mouth daily. 30 tablet 2  . aspirin 81 MG tablet Take 81 mg by mouth daily.    Marland Kitchen atorvastatin (LIPITOR) 40 MG tablet Take 1 tablet (40 mg total) by mouth daily at 6 PM. 90 tablet 0  . blood glucose meter kit and supplies KIT Dispense based on patient and insurance preference. Use up to four times daily as directed. (FOR ICD-9 250.00, 250.01). 1 each 0  . carvedilol (COREG) 12.5 MG tablet Take 1 tablet (12.5 mg total) by mouth 2 (two) times daily with a meal. 60 tablet 2  . docusate sodium (COLACE) 100 MG capsule Take 1 capsule (100 mg total) by mouth every 12 (twelve) hours. 30 capsule 0  . hydrochlorothiazide (MICROZIDE) 12.5 MG capsule Take 1 capsule (12.5 mg total) by mouth daily. 30 capsule 2  . Insulin Glargine (LANTUS SOLOSTAR) 100 UNIT/ML Solostar Pen Inject 20 Units into the skin daily at 10 pm. 5 pen 1  . metFORMIN (GLUCOPHAGE) 1000 MG tablet Take 1 tablet (1,000 mg total) by mouth 2 (two) times daily with a meal. 60 tablet 3  . polyethylene glycol (MIRALAX / GLYCOLAX) packet Take 17 g by mouth daily as needed for mild constipation or  moderate constipation.    . pregabalin (LYRICA) 75 MG capsule Take 1 capsule (75 mg total) by mouth 2 (two) times daily. 60 capsule 2  . senna-docusate (SENOKOT-S) 8.6-50 MG tablet Take 2 tablets by mouth daily. As needed for constipation. 60 tablet 1  . ticagrelor (BRILINTA) 90 MG TABS tablet Take 90 mg by mouth 2 (two) times daily.     No current facility-administered medications for this visit.    Allergies:   Ace inhibitors    Social History:  The patient  reports that she has never smoked. She has never used smokeless tobacco. She reports that she drinks alcohol. She reports that she uses illicit drugs (Marijuana).   Family History:  The patient's family history includes Diabetes in her paternal grandmother; Heart disease in her paternal aunt; Heart disease (age of onset: 49) in her paternal grandmother; Heart disease (age of onset: 49) in her father; Hypertension in her paternal grandmother. There is no history of Cancer, Stroke, or Alcohol abuse.    ROS:  Please see the history of present illness.   Otherwise, review  of systems are positive for none.   All other systems are reviewed and negative.    PHYSICAL EXAM: VS:  BP 139/77 mmHg  Pulse 75  Ht 5' 2.5" (1.588 m)  Wt 229 lb (103.874 kg)  BMI 41.19 kg/m2  LMP 04/22/2016 (Approximate) , BMI Body mass index is 41.19 kg/(m^2). GEN: Well nourished, well developed, in no acute distress HEENT: normal Neck: no JVD, carotid bruits, or masses Cardiac: RRR; no murmurs, rubs, or gallops,no edema  Respiratory:  clear to auscultation bilaterally, normal work of breathing GI: soft, nontender, nondistended, + BS MS: no deformity or atrophy Skin: warm and dry, no rash Neuro:  Strength and sensation are intact Psych: euthymic mood, full affect   EKG:  EKG is not ordered today.    Recent Labs: 02/25/2016: Magnesium 2.1 03/08/2016: ALT 30 03/15/2016: Hemoglobin 8.9*; Platelets 162 03/22/2016: Brain Natriuretic Peptide 64.0; BUN 9; Creat  0.89; Potassium 4.1; Sodium 139 04/19/2016: TSH 1.66    Lipid Panel    Component Value Date/Time   CHOL 169 11/17/2013 0935   TRIG 749* 02/10/2016 2355   HDL 48 11/17/2013 0935   CHOLHDL 3.5 11/17/2013 0935   VLDL 22 11/17/2013 0935   LDLCALC 99 11/17/2013 0935      Wt Readings from Last 3 Encounters:  05/15/16 229 lb (103.874 kg)  05/09/16 231 lb 12.8 oz (105.144 kg)  04/24/16 235 lb 3.7 oz (106.7 kg)       ASSESSMENT AND PLAN:  1.  Coronary artery disease involving native coronary arteries Without angina:  She is doing well overall with improvement in symptoms after RCA PCI. Continue cardiac rehabilitation. Continue dual antiplatelet therapy at least until May 2018.  2. Chronic systolic heart failure: Ejection fraction was 40-45%. She appears to be euvolemic without diuretics. Avoid ACE inhibitor due to possible angioedema.  3. Obstructive sleep apnea: She is scheduled for CPAP titration later this month.  4. Hyperlipidemia: Continue treatment with atorvastatin. I requested fasting lipid and liver profile to be done today.   Disposition:   FU with me in 4 months  Signed,  [name], MD  05/15/2016 9:12 AM    [place]

## 2016-05-15 NOTE — Patient Instructions (Signed)
Medication Instructions:  Continue current medications  Labwork: Fasting Lipids Liver Today  Testing/Procedures: NONE  Follow-Up: Your physician recommends that you schedule a follow-up appointment in: 4 Months   Any Other Special Instructions Will Be Listed Below (If Applicable).   If you need a refill on your cardiac medications before your next appointment, please call your pharmacy.

## 2016-05-16 ENCOUNTER — Encounter (HOSPITAL_COMMUNITY)
Admission: RE | Admit: 2016-05-16 | Discharge: 2016-05-16 | Disposition: A | Discharge status: Home / Self Care | Payer: BLUE CROSS/BLUE SHIELD | Source: Ambulatory Visit | Attending: Cardiovascular Disease | Admitting: Cardiovascular Disease

## 2016-05-16 ENCOUNTER — Encounter (HOSPITAL_COMMUNITY): Payer: BLUE CROSS/BLUE SHIELD

## 2016-05-16 DIAGNOSIS — Z955 Presence of coronary angioplasty implant and graft: Secondary | ICD-10-CM | POA: Diagnosis not present

## 2016-05-16 LAB — LIPID PANEL
Cholesterol: 117 mg/dL — ABNORMAL LOW (ref 125–200)
HDL: 32 mg/dL — ABNORMAL LOW (ref >=46)
LDL CALC: 35 mg/dL (ref <130)
Total CHOL/HDL Ratio: 3.7 Ratio (ref <=5.0)
Triglycerides: 250 mg/dL — ABNORMAL HIGH (ref <150)
VLDL: 50 mg/dL — ABNORMAL HIGH (ref <30)

## 2016-05-16 LAB — HEPATIC FUNCTION PANEL
ALK PHOS: 69 U/L (ref 33–115)
ALT: 16 U/L (ref 6–29)
AST: 20 U/L (ref 10–35)
Albumin: 3.6 g/dL (ref 3.6–5.1)
BILIRUBIN INDIRECT: 0.6 mg/dL (ref 0.2–1.2)
BILIRUBIN TOTAL: 0.8 mg/dL (ref 0.2–1.2)
Bilirubin, Direct: 0.2 mg/dL (ref <=0.2)
Total Protein: 6.1 g/dL (ref 6.1–8.1)

## 2016-05-18 ENCOUNTER — Encounter (HOSPITAL_COMMUNITY): Payer: BLUE CROSS/BLUE SHIELD

## 2016-05-18 ENCOUNTER — Ambulatory Visit (INDEPENDENT_AMBULATORY_CARE_PROVIDER_SITE_OTHER): Payer: BLUE CROSS/BLUE SHIELD | Admitting: Family Medicine

## 2016-05-18 VITALS — BP 128/64 | HR 73 | Temp 97.6°F | Ht 63.0 in | Wt 228.0 lb

## 2016-05-18 DIAGNOSIS — D509 Iron deficiency anemia, unspecified: Secondary | ICD-10-CM

## 2016-05-18 DIAGNOSIS — E1142 Type 2 diabetes mellitus with diabetic polyneuropathy: Secondary | ICD-10-CM

## 2016-05-18 DIAGNOSIS — D649 Anemia, unspecified: Secondary | ICD-10-CM | POA: Diagnosis not present

## 2016-05-18 DIAGNOSIS — R05 Cough: Secondary | ICD-10-CM

## 2016-05-18 DIAGNOSIS — R059 Cough, unspecified: Secondary | ICD-10-CM

## 2016-05-18 DIAGNOSIS — M7989 Other specified soft tissue disorders: Secondary | ICD-10-CM | POA: Insufficient documentation

## 2016-05-18 DIAGNOSIS — E1159 Type 2 diabetes mellitus with other circulatory complications: Secondary | ICD-10-CM | POA: Diagnosis not present

## 2016-05-18 DIAGNOSIS — I10 Essential (primary) hypertension: Secondary | ICD-10-CM

## 2016-05-18 HISTORY — DX: Iron deficiency anemia, unspecified: D50.9

## 2016-05-18 LAB — CBC WITH DIFFERENTIAL/PLATELET
BASOS ABS: 0 {cells}/uL (ref 0–200)
Basophils Relative: 0 %
EOS ABS: 129 {cells}/uL (ref 15–500)
EOS PCT: 1 %
HCT: 35.2 % (ref 35.0–45.0)
Hemoglobin: 10.8 g/dL — ABNORMAL LOW (ref 11.7–15.5)
Lymphocytes Relative: 13 %
Lymphs Abs: 1677 cells/uL (ref 850–3900)
MCH: 23.4 pg — AB (ref 27.0–33.0)
MCHC: 30.7 g/dL — AB (ref 32.0–36.0)
MCV: 76.4 fL — AB (ref 80.0–100.0)
MONOS PCT: 6 %
MPV: 11.7 fL (ref 7.5–12.5)
Monocytes Absolute: 774 cells/uL (ref 200–950)
NEUTROS PCT: 80 %
Neutro Abs: 10320 cells/uL — ABNORMAL HIGH (ref 1500–7800)
PLATELETS: 250 10*3/uL (ref 140–400)
RBC: 4.61 MIL/uL (ref 3.80–5.10)
RDW: 17 % — ABNORMAL HIGH (ref 11.0–15.0)
WBC: 12.9 10*3/uL — ABNORMAL HIGH (ref 3.8–10.8)

## 2016-05-18 LAB — IRON AND TIBC
%SAT: 9 % — AB (ref 11–50)
IRON: 35 ug/dL — AB (ref 40–190)
TIBC: 406 ug/dL (ref 250–450)
UIBC: 371 ug/dL (ref 125–400)

## 2016-05-18 LAB — FERRITIN: FERRITIN: 22 ng/mL (ref 10–232)

## 2016-05-18 MED ORDER — PREGABALIN 150 MG PO CAPS: 150.0000 mg | ORAL_CAPSULE | Freq: Two times a day (BID) | ORAL | Status: DC

## 2016-05-18 NOTE — Assessment & Plan Note (Signed)
Symptoms uncontrolled with Lyrica 75 mg BID -increase to 150 mg BID

## 2016-05-18 NOTE — Assessment & Plan Note (Signed)
Cough persists. Recently seen by GI and ENT. Will start Steroid burst for inflammation of oropharynx.  -will send for ENT records -GI stopped PPI however ENT restarted, will get records -patient encouraged to continue PPI for the time being

## 2016-05-18 NOTE — Assessment & Plan Note (Signed)
History of anemia. -check CBC, Iron Panel, Ferritin

## 2016-05-18 NOTE — Progress Notes (Signed)
   Subjective:    Patient ID: [name], female    DOB: 02-Feb-1966, 50 y.o.   MRN: [id number]  HPI 50 y/o female presents for routine follow up.  Cough - continue to have daily cough, productive of sputum, has not been using albuterol inhaler 05/09/16 - seen at GI, unable to do EGD due to duel antiplatelet therapy,  05/18/16 - seen at ENT [facility]), swelling still present in throat/larynx, was given steroids yesterday   DM - did have one episode of 300 a few weeks ago, AM Fasting mostly in the low 200's, currently on Lantus 20 units daily and Metformin 1000 BID  HTN - started HCTZ 12.5 at last visit, Coreg 12.5 BID and Norvasc 10 mg daily,  Home BP 130's/60-70's.   GERD - was taken off GERD medications (Nexium and Carafate by GI), then restarted by ENT  Peripheral Neuropathy - no relief with Lyrica 75 mg BID  OSA - working with Dr. Mayford Knife to get CPAP machine, needs to have another sleep study  Legs - feels like has to move frequently, feels tiredness in legs, no physical sensation associated with this  Anemia - history of anemia, reports no active bleeding  Review of Systems  Constitutional: Negative for fever, chills and fatigue.  Respiratory: Positive for cough.   Cardiovascular: Positive for leg swelling. Negative for chest pain.  Gastrointestinal: Positive for nausea. Negative for vomiting and diarrhea.       Objective:   Physical Exam BP 128/64 mmHg  Pulse 73  Temp(Src) 97.6 F (36.4 C) (Oral)  Ht 5\' 3"  (1.6 m)  Wt 228 lb (103.42 kg)  BMI 40.40 kg/m2  LMP 04/22/2016 (Approximate) Gen: pleasant female, NAD HEENT: normocephalic, PERRL, EOMI, no scleral icterus, MMM, uvula midline Cardiac: RRR, S1 and S2 present, no murmur Resp: CTAB, normal effort Abd: obese, soft, no tenderness, normal bowel sounds Ext: 1+ edema to bilateral knees  04/19/16 TSH normal 03/22/16 normal renal function 03/08/16 albumin low at 3.3 03/15/16 Hbg low at 8/9     Assessment & Plan:   Essential hypertension Well controlled on HCTZ, Coreg, and Amlodipine -no changes in regimen today  Diabetes mellitus, type 2 (HCC) Patient to start steroid burst. Following instructions given to patient. - increase Lantus to 22 units, if your morning blood sugars are greater than 300 then increase to 25 units, if after starting 25 units your blood sugars are greater than 300 in the morning call Dr. Randolm Idol; after you stop the prednisone return to 20 units; please make an appointment with your eye doctor (need dilated eye exam).   Anemia History of anemia. -check CBC, Iron Panel, Ferritin  Diabetic peripheral neuropathy (HCC) Symptoms uncontrolled with Lyrica 75 mg BID -increase to 150 mg BID  Cough Cough persists. Recently seen by GI and ENT. Will start Steroid burst for inflammation of oropharynx.  -will send for ENT records -GI stopped PPI however ENT restarted, will get records -patient encouraged to continue PPI for the time being  Leg swelling Multifactorial from Combined CHF, anemia, low protein, and venous insufficiency.  -check iron levels today -consider compression stockings if worsents

## 2016-05-18 NOTE — Assessment & Plan Note (Signed)
Well controlled on HCTZ, Coreg, and Amlodipine -no changes in regimen today

## 2016-05-18 NOTE — Assessment & Plan Note (Signed)
Patient to start steroid burst. Following instructions given to patient. - increase Lantus to 22 units, if your morning blood sugars are greater than 300 then increase to 25 units, if after starting 25 units your blood sugars are greater than 300 in the morning call Dr. Randolm Idol; after you stop the prednisone return to 20 units; please make an appointment with your eye doctor (need dilated eye exam).

## 2016-05-18 NOTE — Patient Instructions (Addendum)
It was nice to see you today.  Blood Pressure - well controlled, continue current medications  Diabetes - increase Lantus to 22 units, if your morning blood sugars are greater than 300 then increase to 25 units, if after starting 25 units your blood sugars are greater than 300 in the morning call Dr. Randolm Idol; after you stop the prednisone return to 20 units; please make an appointment with your eye doctor (need dilated eye exam).   Acid Reflux - start the medication prescribed by ENT  Nerve Pain - increase Lyrica to 150 mg twice daily   History of Anemia - check labs today  Leg fatigue/restlessness - attempt higher dose of Lyrica, may be related to low hemoglobin  Due for pap smear - make an appointment with Dr. Randolm Idol to have this done.

## 2016-05-18 NOTE — Assessment & Plan Note (Signed)
Multifactorial from Combined CHF, anemia, low protein, and venous insufficiency.  -check iron levels today -consider compression stockings if worsents

## 2016-05-21 ENCOUNTER — Encounter (HOSPITAL_COMMUNITY): Payer: BLUE CROSS/BLUE SHIELD

## 2016-05-21 ENCOUNTER — Telehealth: Payer: Self-pay | Admitting: Family Medicine

## 2016-05-21 DIAGNOSIS — D72829 Elevated white blood cell count, unspecified: Secondary | ICD-10-CM | POA: Insufficient documentation

## 2016-05-21 DIAGNOSIS — D649 Anemia, unspecified: Secondary | ICD-10-CM

## 2016-05-21 HISTORY — DX: Elevated white blood cell count, unspecified: D72.829

## 2016-05-21 MED ORDER — FERROUS SULFATE 325 (65 FE) MG PO TABS: 325.0000 mg | ORAL_TABLET | Freq: Every day | ORAL | Status: DC

## 2016-05-21 NOTE — Telephone Encounter (Signed)
Spoke to patient about lab results.   Anemia - consistent with iron deficiency anemia. No current bleeding, still has period monthly, no heavy bleeding. No blood in stool. Will start iron supplementation.  History of possible Hepatitis C - check labs at next appointment  WBC elevated - no fevers/chills/sob/dysuria. Was not on steroids at time of test. Repeat CBC at next appointment.

## 2016-05-21 NOTE — Assessment & Plan Note (Signed)
Labs consistent with iron deficiency anemia. No current bleeding, still has period monthly, no heavy bleeding. No blood in stool. Will start iron supplementation.

## 2016-05-22 ENCOUNTER — Encounter: Payer: Self-pay | Admitting: Physician Assistant

## 2016-05-22 NOTE — Progress Notes (Signed)
Cardiac Individual Treatment Plan  Patient Details  Name: [name] MRN: [id number] Date of Birth: 1966/09/22 Referring Provider:        CARDIAC REHAB PHASE II ORIENTATION from 04/24/2016 in [place]   Referring Provider  [name] MD      Initial Encounter Date:       CARDIAC REHAB PHASE II ORIENTATION from 04/24/2016 in [place]   Date  04/24/16   Referring Provider  [name] MD      Visit Diagnosis: Stented coronary artery  Patient's Home Medications on Admission:  Current outpatient prescriptions:  .  acetaminophen (TYLENOL) 325 MG tablet, Take 2 tablets (650 mg total) by mouth every 6 (six) hours as needed for mild pain (or Fever >/= 101)., Disp: 60 tablet, Rfl: 0 .  albuterol (PROVENTIL HFA;VENTOLIN HFA) 108 (90 Base) MCG/ACT inhaler, Inhale 2 puffs into the lungs every 6 (six) hours as needed for wheezing or shortness of breath., Disp: 1 Inhaler, Rfl: 0 .  amLODipine (NORVASC) 10 MG tablet, Take 1 tablet (10 mg total) by mouth daily., Disp: 30 tablet, Rfl: 2 .  aspirin 81 MG tablet, Take 81 mg by mouth daily., Disp: , Rfl:  .  atorvastatin (LIPITOR) 40 MG tablet, Take 1 tablet (40 mg total) by mouth daily at 6 PM., Disp: 90 tablet, Rfl: 0 .  blood glucose meter kit and supplies KIT, Dispense based on patient and insurance preference. Use up to four times daily as directed. (FOR ICD-9 250.00, 250.01)., Disp: 1 each, Rfl: 0 .  carvedilol (COREG) 12.5 MG tablet, Take 1 tablet (12.5 mg total) by mouth 2 (two) times daily with a meal., Disp: 60 tablet, Rfl: 2 .  docusate sodium (COLACE) 100 MG capsule, Take 1 capsule (100 mg total) by mouth every 12 (twelve) hours., Disp: 30 capsule, Rfl: 0 .  ferrous sulfate 325 (65 FE) MG tablet, Take 1 tablet (325 mg total) by mouth daily with breakfast., Disp: 90 tablet, Rfl: 1 .  hydrochlorothiazide (MICROZIDE) 12.5 MG capsule, Take 1 capsule (12.5 mg total) by  mouth daily., Disp: 30 capsule, Rfl: 2 .  Insulin Glargine (LANTUS SOLOSTAR) 100 UNIT/ML Solostar Pen, Inject 20 Units into the skin daily at 10 pm., Disp: 5 pen, Rfl: 1 .  metFORMIN (GLUCOPHAGE) 1000 MG tablet, Take 1 tablet (1,000 mg total) by mouth 2 (two) times daily with a meal., Disp: 60 tablet, Rfl: 3 .  polyethylene glycol (MIRALAX / GLYCOLAX) packet, Take 17 g by mouth daily as needed for mild constipation or moderate constipation., Disp: , Rfl:  .  pregabalin (LYRICA) 150 MG capsule, Take 1 capsule (150 mg total) by mouth 2 (two) times daily., Disp: 60 capsule, Rfl: 2 .  senna-docusate (SENOKOT-S) 8.6-50 MG tablet, Take 2 tablets by mouth daily. As needed for constipation., Disp: 60 tablet, Rfl: 1 .  ticagrelor (BRILINTA) 90 MG TABS tablet, Take 90 mg by mouth 2 (two) times daily., Disp: , Rfl:   Past Medical History: Past Medical History  Diagnosis Date  . Hypertension   . Flash pulmonary edema ([place]) 02/10/2016    requiring intubation  . CAD (coronary artery disease) 02/10/2016    a. 03/14/2016 Staged PCI with DES to mid RCA. b. NSTEMI 02/2016: DES to mid-LAD, POBA to 1st Diag, 85% stenosis of RCA noted with staged PCI recommended  . ARF (acute respiratory failure) ([place]) 02/10/2016  . Chronic combined systolic (congestive) and diastolic (congestive) heart failure ([facility])  a. Echo 02/2016: EF 40-45% w/ Grade 2 DD. 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  . Myocardial infarction ([place]) 02/2016  . Type II diabetes mellitus ([place])   . OSA on CPAP   . Obesity (BMI 30-39.9) 04/03/2016    Tobacco Use: History  Smoking status  . Never Smoker   Smokeless tobacco  . Never Used    Labs:     Recent Review Flowsheet Data    Labs for ITP Cardiac and Pulmonary Rehab Latest Ref Rng 02/19/2016 02/19/2016 02/20/2016 04/19/2016 05/15/2016   Cholestrol 125 - 200 mg/dL - - - - 117(L)   LDLCALC <130 mg/dL - - - - 35   HDL >=46 mg/dL - - - - 32(L)   Trlycerides <150 mg/dL  - - - - 250(H)   Hemoglobin A1c - - - - 7.5 -   PHART 7.350 - 7.450 - 7.326(L) 7.433 - -   PCO2ART 35.0 - 45.0 mmHg - 58.7(HH) 41.7 - -   HCO3 20.0 - 24.0 mEq/L - 30.8(H) 28.0(H) - -   TCO2 0 - 100 mmol/L 26 33 29 - -   O2SAT - - 100.0 99.0 - -      Capillary Blood Glucose: Lab Results  Component Value Date   GLUCAP 221* 05/14/2016   GLUCAP 195* 05/11/2016   GLUCAP 232* 05/09/2016   GLUCAP 350* 05/07/2016   GLUCAP 221* 05/04/2016     Exercise Target Goals:    Exercise Program Goal: Individual exercise prescription set with THRR, safety & activity barriers. Participant demonstrates ability to understand and report RPE using BORG scale, to self-measure pulse accurately, and to acknowledge the importance of the exercise prescription.  Exercise Prescription Goal: Starting with aerobic activity 30 plus minutes a day, 3 days per week for initial exercise prescription. Provide home exercise prescription and guidelines that participant acknowledges understanding prior to discharge.  Activity Barriers & Risk Stratification:     Activity Barriers & Cardiac Risk Stratification - 04/24/16 1505    Activity Barriers & Cardiac Risk Stratification   Activity Barriers Balance Concerns;History of Falls;Assistive Device;Other (comment)  Lt knee buckles, bilateral diabetic foot pain   Comments Lt knee buckles, bilateral diabetic foot pain   Cardiac Risk Stratification High      6 Minute Walk:     6 Minute Walk      04/24/16 1458       6 Minute Walk   Phase Initial     Distance 1068 feet     Walk Time 6 minutes     # of Rest Breaks 0     MPH 2.02     METS 2.69     RPE 11     VO2 Peak 9.43     Symptoms Yes (comment)     Comments Low back pain.     Resting HR 72 bpm     Resting BP 120/70 mmHg     Max Ex. HR 85 bpm     Max Ex. BP 130/70 mmHg     2 Minute Post BP 104/62 mmHg        Initial Exercise Prescription:     Initial Exercise Prescription - 04/25/16 0900    Date  of Initial Exercise RX and Referring Provider   Date 04/24/16   Referring Provider [name] MD   Bike   Level 0.7   Minutes 10   METs 2.23   NuStep   Level 2   Minutes 10  METs 2.2   Track   Laps 8   Minutes 10   METs 2.39   Prescription Details   Frequency (times per week) 3   Duration Progress to 30 minutes of continuous aerobic without signs/symptoms of physical distress   Intensity   THRR 40-80% of Max Heartrate 69-138   Ratings of Perceived Exertion 11-13   Perceived Dyspnea 0-4   Progression   Progression Continue to progress workloads to maintain intensity without signs/symptoms of physical distress.   Resistance Training   Training Prescription Yes   Weight 2 lbs.   Reps 10-12      Perform Capillary Blood Glucose checks as needed.  Exercise Prescription Changes:      Exercise Prescription Changes      05/23/16 1700           Exercise Review   Progression Yes       Response to Exercise   Blood Pressure (Admit) 130/79 mmHg       Blood Pressure (Exercise) 120/60 mmHg       Blood Pressure (Exit) 101/75 mmHg       Heart Rate (Admit) 75 bpm       Heart Rate (Exercise) 92 bpm       Heart Rate (Exit) 70 bpm       Rating of Perceived Exertion (Exercise) 13       Duration Progress to 30 minutes of continuous aerobic without signs/symptoms of physical distress       Intensity THRR unchanged       Progression   Progression Continue to progress workloads to maintain intensity without signs/symptoms of physical distress.       Average METs 2.6       Resistance Training   Training Prescription Yes       Weight 2 lbs.       Reps 10-12       Bike   Level 1       Minutes 10       METs 2.8       NuStep   Level 3       Minutes 10       METs 2.3       Track   Laps 10       Minutes 10       METs 2.74          Exercise Comments:      Exercise Comments      05/23/16 1745           Exercise Comments Off to a good start with exercise.           Discharge Exercise Prescription (Final Exercise Prescription Changes):     Exercise Prescription Changes - 05/23/16 1700    Exercise Review   Progression Yes   Response to Exercise   Blood Pressure (Admit) 130/79 mmHg   Blood Pressure (Exercise) 120/60 mmHg   Blood Pressure (Exit) 101/75 mmHg   Heart Rate (Admit) 75 bpm   Heart Rate (Exercise) 92 bpm   Heart Rate (Exit) 70 bpm   Rating of Perceived Exertion (Exercise) 13   Duration Progress to 30 minutes of continuous aerobic without signs/symptoms of physical distress   Intensity THRR unchanged   Progression   Progression Continue to progress workloads to maintain intensity without signs/symptoms of physical distress.   Average METs 2.6   Resistance Training   Training Prescription Yes   Weight 2 lbs.   Reps 10-12  Bike   Level 1   Minutes 10   METs 2.8   NuStep   Level 3   Minutes 10   METs 2.3   Track   Laps 10   Minutes 10   METs 2.74      Nutrition:  Target Goals: Understanding of nutrition guidelines, daily intake of sodium <1550m, cholesterol <2091m calories 30% from fat and 7% or less from saturated fats, daily to have 5 or more servings of fruits and vegetables.  Biometrics:     Pre Biometrics - 04/24/16 1506    Pre Biometrics   Height 5' 2.25" (1.581 m)   Weight 235 lb 3.7 oz (106.7 kg)   Waist Circumference 44 inches   Hip Circumference 48.25 inches   Waist to Hip Ratio 0.91 %   BMI (Calculated) 42.8   Triceps Skinfold 47.5 mm   Grip Strength 28.5 kg   Flexibility 0 in   Single Leg Stand 11.21 seconds       Nutrition Therapy Plan and Nutrition Goals:     Nutrition Therapy & Goals - 05/02/16 1354    Nutrition Therapy   Diet Carb Modified, Therapeutic Lifestyle Changes   Personal Nutrition Goals   Personal Goal #1 1-2 lb wt loss per week to a goal wt loss of 6-24 lb at [facility] and counsel regarding  individualized specific dietary modifications aiming towards targeted core components such as weight, hypertension, lipid management, diabetes, heart failure and other comorbidities.   Expected Outcomes Short Term Goal: Understand basic principles of dietary content, such as calories, fat, sodium, cholesterol and nutrients.;Long Term Goal: Adherence to prescribed nutrition plan.      Nutrition Discharge: Nutrition Scores:     Nutrition Assessments - 05/02/16 1355    MEDFICTS Scores   Pre Score 46      Nutrition Goals Re-Evaluation:   Psychosocial: Target Goals: Acknowledge presence or absence of depression, maximize coping skills, provide positive support system. Participant is able to verbalize types and ability to use techniques and skills needed for reducing stress and depression.  Initial Review & Psychosocial Screening:     Initial Psych Review & Screening - 04/24/16 1616    Family Dynamics   Good Support System? Yes   Barriers   Psychosocial barriers to participate in program The patient should benefit from training in stress management and relaxation.   Screening Interventions   Interventions Encouraged to exercise      Quality of Life Scores:     Quality of Life - 04/24/16 1458    Quality of Life Scores   Health/Function Pre 23.1 %   Socioeconomic Pre 24.38 %   Psych/Spiritual Pre 23.57 %   Family Pre 30 %   GLOBAL Pre 24.47 %      PHQ-9:     Recent Review Flowsheet Data    [facility] 05/18/2016 04/30/2016 04/19/2016 03/27/2016 03/01/2016   Decreased Interest 0 0 0 0 0    Down, Depressed, Hopeless 0 0 1 0 0   PHQ - 2 Score 0 0 1 0 0      Psychosocial Evaluation and Intervention:   Psychosocial Re-Evaluation:     Psychosocial Re-Evaluation      05/22/16 1039           Psychosocial Re-Evaluation   Interventions Encouraged to attend Cardiac Rehabilitation for the exercise       Continued Psychosocial Services Needed No  Vocational Rehabilitation: Provide vocational rehab assistance to qualifying candidates.   Vocational Rehab Evaluation & Intervention:     Vocational Rehab - 05/17/16 1034    Initial Vocational Rehab Evaluation & Intervention   Assessment shows need for Vocational Rehabilitation --  Vocation rehab form faxed       Education: Education Goals: Education classes will be provided on a weekly basis, covering required topics. Participant will state understanding/return demonstration of topics presented.  Learning Barriers/Preferences:     Learning Barriers/Preferences - 04/24/16 1613    Learning Barriers/Preferences   Learning Preferences Individual Instruction;Written Material;Skilled Demonstration      Education Topics: Count Your Pulse:  -Group instruction provided by verbal instruction, demonstration, patient participation and written materials to support subject.  Instructors address importance of being able to find your pulse and how to count your pulse when at home without a heart monitor.  Patients get hands on experience counting their pulse with staff help and individually.   Heart Attack, Angina, and Risk Factor Modification:  -Group instruction provided by verbal instruction, video, and written materials to support subject.  Instructors address signs and symptoms of angina and heart attacks.    Also discuss risk factors for heart disease and how to make changes to improve heart health risk factors.   Functional Fitness:  -Group instruction provided by verbal instruction, demonstration, patient participation, and written materials to support subject.  Instructors address safety measures for doing things around the house.  Discuss how to get up and down off the floor, how to pick things up properly, how to safely get out of a chair without assistance, and balance training.   Meditation and Mindfulness:  -Group instruction provided by verbal instruction, patient  participation, and written materials to support subject.  Instructor addresses importance of mindfulness and meditation practice to help reduce stress and improve awareness.  Instructor also leads participants through a meditation exercise.    Stretching for Flexibility and Mobility:  -Group instruction provided by verbal instruction, patient participation, and written materials to support subject.  Instructors lead participants through series of stretches that are designed to increase flexibility thus improving mobility.  These stretches are additional exercise for major muscle groups that are typically performed during regular warm up and cool down.   Hands Only CPR Anytime:  -Group instruction provided by verbal instruction, video, patient participation and written materials to support subject.  Instructors co-teach with AHA video for hands only CPR.  Participants get hands on experience with mannequins.   Nutrition I class: Heart Healthy Eating:  -Group instruction provided by PowerPoint slides, verbal discussion, and written materials to support subject matter. The instructor gives an explanation and review of the Therapeutic Lifestyle Changes diet recommendations, which includes a discussion on lipid goals, dietary fat, sodium, fiber, plant stanol/sterol esters, sugar, and the components of a well-balanced, healthy diet.   Nutrition II class: Lifestyle Skills:  -Group instruction provided by PowerPoint slides, verbal discussion, and written materials to support subject matter. The instructor gives an explanation and review of label reading, grocery shopping for heart health, heart healthy recipe modifications, and ways to make healthier choices when eating out.   Diabetes Question & Answer:  -Group instruction provided by PowerPoint slides, verbal discussion, and written materials to support subject matter. The instructor gives an explanation and review of diabetes co-morbidities, pre- and  post-prandial blood glucose goals, pre-exercise blood glucose goals, signs, symptoms, and treatment of hypoglycemia and hyperglycemia, and foot care basics.   Diabetes Blitz:  -  Group instruction provided by PowerPoint slides, verbal discussion, and written materials to support subject matter. The instructor gives an explanation and review of the physiology behind type 1 and type 2 diabetes, diabetes medications and rational behind using different medications, pre- and post-prandial blood glucose recommendations and Hemoglobin A1c goals, diabetes diet, and exercise including blood glucose guidelines for exercising safely.    Portion Distortion:  -Group instruction provided by PowerPoint slides, verbal discussion, written materials, and food models to support subject matter. The instructor gives an explanation of serving size versus portion size, changes in portions sizes over the last 20 years, and what consists of a serving from each food group.          CARDIAC REHAB PHASE II EXERCISE from 05/02/2016 in [place]   Date  05/02/16   Educator  RD   Instruction Review Code  2- meets goals/outcomes      Stress Management:  -Group instruction provided by verbal instruction, video, and written materials to support subject matter.  Instructors review role of stress in heart disease and how to cope with stress positively.     Exercising on Your Own:  -Group instruction provided by verbal instruction, power point, and written materials to support subject.  Instructors discuss benefits of exercise, components of exercise, frequency and intensity of exercise, and end points for exercise.  Also discuss use of nitroglycerin and activating EMS.  Review options of places to exercise outside of rehab.  Review guidelines for sex with heart disease.   Cardiac Drugs I:  -Group instruction provided by verbal instruction and written materials to support subject.  Instructor  reviews cardiac drug classes: antiplatelets, anticoagulants, beta blockers, and statins.  Instructor discusses reasons, side effects, and lifestyle considerations for each drug class.   Cardiac Drugs II:  -Group instruction provided by verbal instruction and written materials to support subject.  Instructor reviews cardiac drug classes: angiotensin converting enzyme inhibitors (ACE-I), angiotensin II receptor blockers (ARBs), nitrates, and calcium channel blockers.  Instructor discusses reasons, side effects, and lifestyle considerations for each drug class.   Anatomy and Physiology of the Circulatory System:  -Group instruction provided by verbal instruction, video, and written materials to support subject.  Reviews functional anatomy of heart, how it relates to various diagnoses, and what role the heart plays in the overall system.   Knowledge Questionnaire Score:     Knowledge Questionnaire Score - 05/02/16 1356    Knowledge Questionnaire Score   Pre Score 20/24     DM test returned incomplete      Core Components/Risk Factors/Patient Goals at Admission:     Personal Goals and Risk Factors at Admission - 04/24/16 1614    Core Components/Risk Factors/Patient Goals on Admission    Weight Management Obesity;Yes   Intervention Weight Management: Develop a combined nutrition and exercise program designed to reach desired caloric intake, while maintaining appropriate intake of nutrient and fiber, sodium and fats, and appropriate energy expenditure required for the weight goal.;Obesity: Provide education and appropriate resources to help participant work on and attain dietary goals.;Weight Management/Obesity: Establish reasonable short term and long term weight goals.   Admit Weight 235 lb 3.7 oz (106.7 kg)   Expected Outcomes Short Term: Continue to assess and modify interventions until short term weight is achieved;Long Term: Adherence to nutrition and physical activity/exercise program  aimed toward attainment of established weight goal;Weight Loss: Understanding of general recommendations for a balanced deficit meal plan, which promotes 1-2 lb weight  loss per week and includes a negative energy balance of [phone] kcal/d;Understanding recommendations for meals to include 15-35% energy as protein, 25-35% energy from fat, 35-60% energy from carbohydrates, less than 241m of dietary cholesterol, 20-35 gm of total fiber daily   Sedentary Yes   Intervention Provide advice, education, support and counseling about physical activity/exercise needs.;Develop an individualized exercise prescription for aerobic and resistive training based on initial evaluation findings, risk stratification, comorbidities and participant's personal goals.   Expected Outcomes Achievement of increased cardiorespiratory fitness and enhanced flexibility, muscular endurance and strength shown through measurements of functional capacity and personal statement of participant.      Core Components/Risk Factors/Patient Goals Review:      Goals and Risk Factor Review      05/23/16 1746           Core Components/Risk Factors/Patient Goals Review   Personal Goals Review Sedentary;Other       Review Doing well with exercise at cardiac rehab. Increasing workloads as tolerated.       Expected Outcomes Achieve weight loss goals and improve strength and stamina through regular aerobic exercise and resistance training.          Core Components/Risk Factors/Patient Goals at Discharge (Final Review):      Goals and Risk Factor Review - 05/23/16 1746    Core Components/Risk Factors/Patient Goals Review   Personal Goals Review Sedentary;Other   Review Doing well with exercise at cardiac rehab. Increasing workloads as tolerated.   Expected Outcomes Achieve weight loss goals and improve strength and stamina through regular aerobic exercise and resistance training.      ITP Comments:     ITP Comments       04/24/16 1609           ITP Comments Dr. TFransico Him Medical Director           Comments: Pt is making expected progress toward personal goals after completing 8 sessions. Recommend continued exercise and life style modification education including  stress management and relaxation techniques to decrease cardiac risk profile. A vocational rehab referral was made. [name] has had some issues with elevated blood sugars otherwise [name] is enjoying participating in cardiac rehab. MBarnet Pall RN,BSN 05/24/2016 10:22 AM

## 2016-05-23 ENCOUNTER — Encounter (HOSPITAL_COMMUNITY): Payer: BLUE CROSS/BLUE SHIELD

## 2016-05-24 ENCOUNTER — Telehealth (HOSPITAL_COMMUNITY): Payer: Self-pay | Admitting: *Deleted

## 2016-05-24 ENCOUNTER — Encounter (HOSPITAL_BASED_OUTPATIENT_CLINIC_OR_DEPARTMENT_OTHER): Payer: BLUE CROSS/BLUE SHIELD

## 2016-05-25 ENCOUNTER — Encounter (HOSPITAL_COMMUNITY)
Admission: RE | Admit: 2016-05-25 | Discharge: 2016-05-25 | Disposition: A | Discharge status: Home / Self Care | Payer: BLUE CROSS/BLUE SHIELD | Source: Ambulatory Visit | Attending: Cardiovascular Disease | Admitting: Cardiovascular Disease

## 2016-05-25 ENCOUNTER — Encounter (HOSPITAL_COMMUNITY): Payer: BLUE CROSS/BLUE SHIELD

## 2016-05-25 DIAGNOSIS — Z955 Presence of coronary angioplasty implant and graft: Secondary | ICD-10-CM

## 2016-05-28 ENCOUNTER — Institutional Professional Consult (permissible substitution): Payer: BLUE CROSS/BLUE SHIELD | Admitting: Pulmonary Disease

## 2016-05-28 ENCOUNTER — Encounter (HOSPITAL_COMMUNITY): Payer: BLUE CROSS/BLUE SHIELD

## 2016-05-28 ENCOUNTER — Ambulatory Visit (HOSPITAL_BASED_OUTPATIENT_CLINIC_OR_DEPARTMENT_OTHER): Payer: BLUE CROSS/BLUE SHIELD | Attending: Cardiology | Admitting: Cardiology

## 2016-05-28 DIAGNOSIS — G4736 Sleep related hypoventilation in conditions classified elsewhere: Secondary | ICD-10-CM | POA: Insufficient documentation

## 2016-05-28 DIAGNOSIS — G4733 Obstructive sleep apnea (adult) (pediatric): Secondary | ICD-10-CM

## 2016-05-29 ENCOUNTER — Ambulatory Visit (HOSPITAL_COMMUNITY): Payer: BLUE CROSS/BLUE SHIELD

## 2016-05-30 ENCOUNTER — Encounter (HOSPITAL_COMMUNITY)
Admission: RE | Admit: 2016-05-30 | Discharge: 2016-05-30 | Disposition: A | Discharge status: Home / Self Care | Payer: BLUE CROSS/BLUE SHIELD | Source: Ambulatory Visit | Attending: Cardiovascular Disease | Admitting: Cardiovascular Disease

## 2016-05-30 ENCOUNTER — Encounter (HOSPITAL_COMMUNITY): Payer: BLUE CROSS/BLUE SHIELD

## 2016-05-30 DIAGNOSIS — Z955 Presence of coronary angioplasty implant and graft: Secondary | ICD-10-CM

## 2016-05-30 NOTE — Progress Notes (Signed)
Reviewed home exercise guidelines with patient including endpoints, temperature precautions, target heart rate and rate of perceived exertion. Pt plans to walk as her mode of home exercise. Pt voices understanding of instructions given. Judi Jaffe M Daronte Shostak, MS, ACSM CCEP  

## 2016-05-30 NOTE — Progress Notes (Signed)
[facility] does not have a prescription for sublingual nitroglycerin will check with Dr Kennith Maes.Gladstone Lighter, RN,BSN 05/30/2016 12:27 PM

## 2016-06-01 ENCOUNTER — Encounter (HOSPITAL_COMMUNITY): Payer: BLUE CROSS/BLUE SHIELD

## 2016-06-01 ENCOUNTER — Telehealth (HOSPITAL_COMMUNITY): Payer: Self-pay | Admitting: *Deleted

## 2016-06-01 NOTE — Telephone Encounter (Signed)
-----   Message from Iran Ouch, MD sent at 06/01/2016  4:17 PM EDT ----- Regarding: RE: sublingual nitro glycerin No need if she is not having angina.  ----- Message ----- From: Cammy Copa, RN Sent: 05/30/2016  12:28 PM To: Iona Coach, RN, Iran Ouch, MD Subject: sublingual nitro glycerin                      Good afternoon Dr Kennith Maes,  [name] is enjoying participating in phase 2 cardiac rehab. [name] does not have a prescription for sublingual nitroglycerin. Checking to see if she needs a prescription?  Thanks for your assistance,   Gladstone Lighter, RN,BSN 05/30/2016 12:37 PM

## 2016-06-04 ENCOUNTER — Encounter (HOSPITAL_COMMUNITY): Payer: BLUE CROSS/BLUE SHIELD

## 2016-06-05 ENCOUNTER — Other Ambulatory Visit: Payer: Self-pay | Admitting: Family Medicine

## 2016-06-05 ENCOUNTER — Other Ambulatory Visit: Payer: Self-pay | Admitting: *Deleted

## 2016-06-05 MED ORDER — ATORVASTATIN CALCIUM 40 MG PO TABS: 40.0000 mg | ORAL_TABLET | Freq: Every day | ORAL | 1 refills | Status: DC

## 2016-06-06 ENCOUNTER — Encounter (HOSPITAL_COMMUNITY): Payer: BLUE CROSS/BLUE SHIELD

## 2016-06-06 ENCOUNTER — Encounter (HOSPITAL_COMMUNITY): Admission: RE | Admit: 2016-06-06 | Payer: BLUE CROSS/BLUE SHIELD | Source: Ambulatory Visit

## 2016-06-08 ENCOUNTER — Telehealth (HOSPITAL_COMMUNITY): Payer: Self-pay | Admitting: *Deleted

## 2016-06-08 ENCOUNTER — Encounter (HOSPITAL_COMMUNITY): Payer: BLUE CROSS/BLUE SHIELD

## 2016-06-08 ENCOUNTER — Ambulatory Visit: Payer: BLUE CROSS/BLUE SHIELD | Admitting: Student

## 2016-06-08 NOTE — Procedures (Signed)
   Patient Name: [name], [name] Date: 05/28/2016 Gender: Female D.O.B: 20-Oct-1966 Age (years): 38 Referring Provider: Armanda Magic MD, ABSM Height (inches): 63 Interpreting Physician: Armanda Magic MD, ABSM Weight (lbs): 230 RPSGT: Armen Pickup BMI: 41 MRN: [id number] Neck Size: 19.50  CLINICAL INFORMATION The patient is referred for a CPAP titration to treat sleep apnea.   SLEEP STUDY TECHNIQUE As per the AASM Manual for the Scoring of Sleep and Associated Events v2.3 (April 2016) with a hypopnea requiring 4% desaturations. The channels recorded and monitored were frontal, central and occipital EEG, electrooculogram (EOG), submentalis EMG (chin), nasal and oral airflow, thoracic and abdominal wall motion, anterior tibialis EMG, snore microphone, electrocardiogram, and pulse oximetry. Continuous positive airway pressure (CPAP) was initiated at the beginning of the study and titrated to treat sleep-disordered breathing.  MEDICATIONS Medications taken by the patient : Reviewed in the chart Medications administered by patient during sleep study : No sleep medicine administered.  TECHNICIAN COMMENTS Comments added by technician: NO RESTROOM VISTED  Comments added by scorer: N/A  RESPIRATORY PARAMETERS Optimal PAP Pressure (cm): 16  AHI at Optimal Pressure (/hr):2.4 Overall Minimal O2 (%):80.00  Supine % at Optimal Pressure (%):100 Minimal O2 at Optimal Pressure (%): 87.0    SLEEP ARCHITECTURE The study was initiated at 10:15:38 PM and ended at 4:37:44 AM. Sleep onset time was 3.8 minutes and the sleep efficiency was 97.0%. The total sleep time was 370.5 minutes. The patient spent 1.08% of the night in stage N1 sleep, 67.34% in stage N2 sleep, 0.27% in stage N3 and 31.31% in REM.Stage REM latency was 45.5 minutes Wake after sleep onset was 7.8. Alpha intrusion was absent. Supine sleep was 72.74%.  CARDIAC DATA The 2 lead EKG demonstrated sinus rhythm. The mean heart rate  was 65.06 beats per minute. Other EKG findings include: None.  LEG MOVEMENT DATA The total Periodic Limb Movements of Sleep (PLMS) were 0. The PLMS index was 0.00. A PLMS index of <15 is considered normal in adults.  IMPRESSIONS - The optimal PAP pressure was 16 cm of water. - Central sleep apnea was not noted during this titration (CAI = 0.6/h). - Severe oxygen desaturations were observed during this titration (min O2 = 80.00%).  Total time seen with Oxygen saturations < 88% was 18.2 minutes.  - No snoring was audible during this study. - No cardiac abnormalities were observed during this study. - Clinically significant periodic limb movements were not noted during this study. Arousals associated with PLMs were rare.  DIAGNOSIS - Obstructive Sleep Apnea (327.23 [G47.33 ICD-10]) - Nocturnal Hypoxemia  RECOMMENDATIONS - Trial of CPAP therapy on 16 cm H2O with a Small size Resmed Full Face Mask AirFit F20 mask and heated humidification. - Avoid alcohol, sedatives and other CNS depressants that may worsen sleep apnea and disrupt normal sleep architecture. - Sleep hygiene should be reviewed to assess factors that may improve sleep quality. - Weight management and regular exercise should be initiated or continued. - Return to Sleep Center for re-evaluation after 10 weeks of therapy - Overnight pulse oximetry on CPAP therapy to determine if supplemental nighttime O2 needed.    Armanda Magic Diplomate, American Board of Sleep Medicine  ELECTRONICALLY SIGNED ON:  06/08/2016, 3:21 PM [facility] PH: [phone]   FX: [phone] ACCREDITED BY THE AMERICAN ACADEMY OF SLEEP MEDICINE

## 2016-06-08 NOTE — Telephone Encounter (Signed)
Placed follow up call for pt.  Pt called earlier this week indicating that she would be out due to falling.  Left message regarding pt general well being.  Requested call back, contact information provided. Alanson Aly, BSN

## 2016-06-11 ENCOUNTER — Encounter (HOSPITAL_COMMUNITY): Payer: BLUE CROSS/BLUE SHIELD

## 2016-06-11 ENCOUNTER — Encounter (HOSPITAL_COMMUNITY): Admission: RE | Admit: 2016-06-11 | Payer: BLUE CROSS/BLUE SHIELD | Source: Ambulatory Visit

## 2016-06-12 NOTE — Progress Notes (Signed)
Let me patient know she had a successful titration. Patient verbalized understanding. Will send orders to [facility].

## 2016-06-13 ENCOUNTER — Encounter (HOSPITAL_COMMUNITY): Payer: BLUE CROSS/BLUE SHIELD

## 2016-06-15 ENCOUNTER — Encounter (HOSPITAL_COMMUNITY): Payer: BLUE CROSS/BLUE SHIELD

## 2016-06-15 ENCOUNTER — Other Ambulatory Visit: Payer: Self-pay | Admitting: *Deleted

## 2016-06-15 ENCOUNTER — Encounter: Payer: Self-pay | Admitting: *Deleted

## 2016-06-15 ENCOUNTER — Encounter (HOSPITAL_COMMUNITY): Admission: RE | Admit: 2016-06-15 | Payer: BLUE CROSS/BLUE SHIELD | Source: Ambulatory Visit

## 2016-06-15 DIAGNOSIS — Z006 Encounter for examination for normal comparison and control in clinical research program: Secondary | ICD-10-CM

## 2016-06-15 MED ORDER — AMBULATORY NON FORMULARY MEDICATION: 81.0000 mg | Freq: Every day | Status: DC

## 2016-06-15 MED ORDER — AMBULATORY NON FORMULARY MEDICATION: 90.0000 mg | Freq: Two times a day (BID) | Status: DC

## 2016-06-15 NOTE — Progress Notes (Signed)
TWILIGHT Research study 3 month randomization visit completed. patient denies any bleeding or other adverse events. She also states she has been compliant with Brilinta and ASA. Today she was  Randomized to ASA 81 mg or PLACEBO as part of the TWILIGHT Research protocol.  Instructed patient the importance of compliance to medication and instructed to not take any other ASA then what is provided by research. Questions encouraged and answered.

## 2016-06-16 ENCOUNTER — Other Ambulatory Visit: Payer: Self-pay | Admitting: Family Medicine

## 2016-06-18 ENCOUNTER — Encounter: Payer: Self-pay | Admitting: Family Medicine

## 2016-06-18 ENCOUNTER — Encounter (HOSPITAL_COMMUNITY): Payer: BLUE CROSS/BLUE SHIELD

## 2016-06-18 ENCOUNTER — Ambulatory Visit (HOSPITAL_COMMUNITY)
Admission: RE | Admit: 2016-06-18 | Discharge: 2016-06-18 | Disposition: A | Discharge status: Home / Self Care | Payer: BLUE CROSS/BLUE SHIELD | Source: Ambulatory Visit | Attending: Family Medicine | Admitting: Family Medicine

## 2016-06-18 ENCOUNTER — Ambulatory Visit (INDEPENDENT_AMBULATORY_CARE_PROVIDER_SITE_OTHER): Payer: BLUE CROSS/BLUE SHIELD | Admitting: Family Medicine

## 2016-06-18 VITALS — BP 165/73 | HR 78 | Temp 98.3°F | Wt 235.2 lb

## 2016-06-18 DIAGNOSIS — M7989 Other specified soft tissue disorders: Secondary | ICD-10-CM | POA: Diagnosis not present

## 2016-06-18 DIAGNOSIS — R233 Spontaneous ecchymoses: Secondary | ICD-10-CM | POA: Insufficient documentation

## 2016-06-18 DIAGNOSIS — R05 Cough: Secondary | ICD-10-CM

## 2016-06-18 DIAGNOSIS — R238 Other skin changes: Secondary | ICD-10-CM

## 2016-06-18 DIAGNOSIS — E1159 Type 2 diabetes mellitus with other circulatory complications: Secondary | ICD-10-CM | POA: Diagnosis not present

## 2016-06-18 DIAGNOSIS — R059 Cough, unspecified: Secondary | ICD-10-CM

## 2016-06-18 HISTORY — DX: Spontaneous ecchymoses: R23.3

## 2016-06-18 HISTORY — DX: Other skin changes: R23.8

## 2016-06-18 LAB — CBC
HCT: 35 % (ref 35.0–45.0)
Hemoglobin: 10.6 g/dL — ABNORMAL LOW (ref 11.7–15.5)
MCH: 23.4 pg — AB (ref 27.0–33.0)
MCHC: 30.3 g/dL — AB (ref 32.0–36.0)
MCV: 77.3 fL — ABNORMAL LOW (ref 80.0–100.0)
MPV: 10.9 fL (ref 7.5–12.5)
PLATELETS: 285 10*3/uL (ref 140–400)
RBC: 4.53 MIL/uL (ref 3.80–5.10)
RDW: 18 % — AB (ref 11.0–15.0)
WBC: 10.2 10*3/uL (ref 3.8–10.8)

## 2016-06-18 MED ORDER — DM-GUAIFENESIN ER 30-600 MG PO TB12: 1.0000 | ORAL_TABLET | Freq: Two times a day (BID) | ORAL | 1 refills | Status: DC

## 2016-06-18 NOTE — Assessment & Plan Note (Addendum)
Patient continue to experience chronic cough. She has noticed very little change in frequency and acuity of her symptoms despite mucolytic use, PPI and recent steroid burst secondary oropharynx inflammation in the setting or recent intubation. Patient pulmonary work up was negative for any lung process. Work up by GI and ENT consistent with upper airway inflammation will continue to monitor for improvement and provide additional treatment to increase mucus clearance. --Start patient on Mucinex 30-600 mg BID, will reevaluate at next clinic visit.

## 2016-06-18 NOTE — Progress Notes (Signed)
Subjective:    Patient ID: [name], female    DOB: [birth year], 50 y.o.   MRN: [id number]   CC: Left arm/ Right forearm/ left leg bruising  HPI: [name] is a 50 yo with a past medical history significant for HTN, combined systolic and diastolic CHF, NSTEMI, DMII,  OSA, HLD, GERD who presented to clinic with acute onset of bilateral upper extremities and right leg bruising.  Upper and lower extremities bruising: Patient reports that she started to notice some bruising on her right arm last Wednesday. Patient went to her scheduled cardiology appointment on Friday as she is part of TWILIGHT research study conducted by the cardiology department. Patient was asked of any signs of bleeding which she denied at the time. Over the weekend patient noticed additional bruising on her right forearm and left leg and arm. Patient denies any blood in stool or urine, hematemesis, bleeding gum, epistaxis, or BRBPR. Patient also noticed right leg swelling below her knee on Sunday with increase tightness. Patient denies any pain, shortness of breath, chest pain, headaches, vision troubles or difficulty ambulating.  Cough: Patient with a history of chronic cough since hospitalization for NSTEMI in April when she was intubated in the ICU. Pulmonary work up ruled out lung process and she was told cough most likely related to intubation. Patient admit to using mucolytic with minimal relief in symptoms.  DM type 2: Patient reports elevated pre prandial (200's) and post prandial (300')s bood glucose level since last visit in clinic. Patient states she has been following treatment plan as recommended by PCP.  Smoking status reviewed   ROS: all other systems were reviewed and are negative other than in the HPI   Past medical history, surgical, family, and social history reviewed and updated in the EMR as appropriate.  Objective:  BP (!) 165/73   Pulse 78   Temp 98.3 F (36.8 C) (Oral)   Wt 235 lb 3.2 oz  (106.7 kg)   BMI 41.66 kg/m  Vitals and nursing note reviewed General: NAD, pleasant, able to participate in exam Cardiac: RRR, normal heart sounds, no murmurs. 2+ radial and PT pulses bilaterally Respiratory: CTAB, normal effort, No wheezes, rales or rhonchi Abdomen: soft, nontender, nondistended, no hepatic or splenomegaly, +BS Extremities: Right forearm ecchymosis (x2) with mild induration , right leg ecchymosis with increase leg circumference, and left arm ecchymosis, full ROM, normal strength Skin: warm and dry, no rashes noted Neuro: alert and oriented x4, no focal deficits Psych: Normal affect and mood           Assessment & Plan:    Cough Patient continue to experience chronic cough. She has noticed very little change in frequency and acuity of her symptoms despite mucolytic use, PPI and recent steroid burst secondary oropharynx inflammation in the setting or recent intubation. Patient pulmonary work up was negative for any lung process. Work up by GI and ENT consistent with upper airway inflammation will continue to monitor for improvement and provide additional treatment to increase mucus clearance. --Start patient on Mucinex 30-600 mg BID, will reevaluate at next clinic visit.  Leg swelling New onset of swollen right leg with ecchymosis noted on exam and concerning for hematoma vs DVT. --Order right lower leg venous doppler   Diabetes mellitus, type 2 (HCC) Patient continue to endorse elevated blood glucose after completing steroid burst a month ago. Patient was on 20 units prior to steroid course, was then transitioned to 25 units of lantus during treatment  and ask to return to baseline 20 units after completion. However, with continued elevated blood glucose (200-300's) patient will be restart on 25 units of Lantus until next clinic visit. --Increase lantus dose from 20 to 25 units once daily --Continue metformin 1000 BID  Abnormal bruising Patient has been  enrolled in the twilight study conducted by the cardiology department at [place]. Patient was randomized to the treatment arm of the study and has been on Brilinta/Ticagrelor a blood thinner use for patient with a history of heart attack. Patient has also been taken aspirin 81 mg. Acute bruising most likely secondary to brilinta, which increases risk of serious bleeding. Patient will contact research study coordinator and will most likely be remove from study. --Order CBC, Protime-INR, PT, aPTT. Will follow up in clinic. Patient given clear instruction to present to ED with worsenin bleeding/ecchymosis or increase shortness of breath, hematemesis, bloody cough, or BRBPR or tarry stools.    [name], MD Family Medicine Resident PGY-1

## 2016-06-18 NOTE — Assessment & Plan Note (Signed)
Patient has been enrolled in the twilight study conducted by the cardiology department at [place]. Patient was randomized to the treatment arm of the study and has been on Brilinta/Ticagrelor a blood thinner use for patient with a history of heart attack. Patient has also been taken aspirin 81 mg. Acute bruising most likely secondary to brilinta, which increases risk of serious bleeding. Patient will contact research study coordinator and will most likely be remove from study. --Order CBC, Protime-INR, PT, aPTT. Will follow up in clinic. Patient given clear instruction to present to ED with worsenin bleeding/ecchymosis or increase shortness of breath, hematemesis, bloody cough, or BRBPR or tarry stools.

## 2016-06-18 NOTE — Assessment & Plan Note (Signed)
New onset of swollen right leg with ecchymosis noted on exam and concerning for hematoma vs DVT. --Order right lower leg venous doppler

## 2016-06-18 NOTE — Assessment & Plan Note (Signed)
Patient continue to endorse elevated blood glucose after completing steroid burst a month ago. Patient was on 20 units prior to steroid course, was then transitioned to 25 units of lantus during treatment and ask to return to baseline 20 units after completion. However, with continued elevated blood glucose (200-300's) patient will be restart on 25 units of Lantus until next clinic visit. --Increase lantus dose from 20 to 25 units once daily --Continue metformin 1000 BID

## 2016-06-18 NOTE — Patient Instructions (Addendum)
It was great seeing you today! We have addressed the following issues today  1. Recent bruising on your right and left arm and forearm, as well as right leg. Will check your blood count and platelet. 2. Swollen right leg concerning for possible blood clot or bleeding, will have a ultrasound of your right leg 3. Chronic cough most likely secondary to intubation when you were hospitalized for your heart attack    If we did any lab work today, and the results require attention, either me or my nurse will get in touch with you. If everything is normal, you will get a letter in mail. If you don't hear from [place] in two weeks, please give [place] a call. Otherwise, I look forward to talking with you again at our next visit. If you have any questions or concerns before then, please call the clinic at [phone].  Please bring all your medications to every doctors visit   Sign up for My Chart to have easy access to your labs results, and communication with your Primary care physician.    Please check-out at the front desk before leaving the clinic.   Take Care,

## 2016-06-18 NOTE — Progress Notes (Addendum)
VASCULAR LAB PRELIMINARY  PRELIMINARY  PRELIMINARY  PRELIMINARY  Right lower extremity venous duplex has been completed.     Right:  No evidence of DVT, superficial thrombosis, or Baker's cyst.  Small hematoma below the knee anterior/medial with debris-at the bruising area.  Called Dr.Diallo with results no one answered the phones.    Lutie Pickler, RVT, RDMS 06/18/2016, 1:36 PM

## 2016-06-19 ENCOUNTER — Telehealth (HOSPITAL_COMMUNITY): Payer: Self-pay | Admitting: *Deleted

## 2016-06-19 ENCOUNTER — Encounter (HOSPITAL_COMMUNITY): Payer: Self-pay | Admitting: *Deleted

## 2016-06-19 LAB — APTT: aPTT: 29 s (ref 22–34)

## 2016-06-19 LAB — PROTIME-INR
INR: 1.1
PROTHROMBIN TIME: 11.3 s (ref 9.0–11.5)

## 2016-06-19 NOTE — Progress Notes (Signed)
Discharge Summary  Patient Details  Name: [name] MRN: [id number] Date of Birth: 11/24/1965 Referring Provider:   Flowsheet Row CARDIAC REHAB PHASE [facility]  Referring Provider  Lorine BearsArida, Muhammad MD       Number of Visits: 9  Reason for Discharge:  Early Exit:  Back to work  Smoking History:  History  Smoking Status  . Never Smoker  Smokeless Tobacco  . Never Used    Diagnosis:  No diagnosis found.  ADL UCSD:   Initial Exercise Prescription:     Initial Exercise Prescription - 04/25/16 0900      Date of Initial Exercise RX and Referring Provider   Date 04/24/16   Referring Provider Lorine BearsArida, Muhammad MD     Bike   Level 0.7   Minutes 10   METs 2.23     NuStep   Level 2   Minutes 10   METs 2.2     Track   Laps 8   Minutes 10   METs 2.39     Prescription Details   Frequency (times per week) 3   Duration Progress to 30 minutes of continuous aerobic without signs/symptoms of physical distress     Intensity   THRR 40-80% of Max Heartrate 69-138   Ratings of Perceived Exertion 11-13   Perceived Dyspnea 0-4     Progression   Progression Continue to progress workloads to maintain intensity without signs/symptoms of physical distress.     Resistance Training   Training Prescription Yes   Weight 2 lbs.   Reps 10-12      Discharge Exercise Prescription (Final Exercise Prescription Changes):     Exercise Prescription Changes - 06/22/16 1400      Exercise Review   Progression Yes     Response to Exercise   Blood Pressure (Admit) 100/70   Blood Pressure (Exercise) 138/80   Blood Pressure (Exit) 118/60   Heart Rate (Admit) 74 bpm   Heart Rate (Exercise) 96 bpm   Heart Rate (Exit) 71 bpm   Rating of Perceived Exertion (Exercise) 13   Comments Reviewed home exercise guidelines on 05/30/16.   Duration Progress to 30 minutes of continuous aerobic without signs/symptoms of physical  distress   Intensity THRR unchanged     Progression   Progression Continue to progress workloads to maintain intensity without signs/symptoms of physical distress.   Average METs 2.6     Resistance Training   Training Prescription Yes   Weight 2 lbs.   Reps 10-12     Bike   Level 1   Minutes 10   METs 2.78     NuStep   Level 3   Minutes 10   METs 2.2     Track   Laps 10   Minutes 10   METs 2.74     Home Exercise Plan   Plans to continue exercise at Home   Frequency Add 2 additional days to program exercise sessions.      Functional Capacity:     6 Minute Walk    Row Name 04/24/16 1458         6 Minute Walk   Phase Initial     Distance 1068 feet     Walk Time 6 minutes     # of Rest Breaks 0     MPH 2.02     METS 2.69     RPE 11     VO2 Peak  9.43     Symptoms Yes (comment)     Comments Low back pain.     Resting HR 72 bpm     Resting BP 120/70     Max Ex. HR 85 bpm     Max Ex. BP 130/70     2 Minute Post BP 104/62        Psychological, QOL, Others - Outcomes: PHQ 2/9: Depression screen [facility] 2/9 06/28/2016 06/18/2016 05/18/2016 04/30/2016 04/19/2016  Decreased Interest 0 0 0 0 0  Down, Depressed, Hopeless 1 0 0 0 1  PHQ - 2 Score 1 0 0 0 1    Quality of Life:     Quality of Life - 04/24/16 1458      Quality of Life Scores   Health/Function Pre 23.1 %   Socioeconomic Pre 24.38 %   Psych/Spiritual Pre 23.57 %   Family Pre 30 %   GLOBAL Pre 24.47 %      Personal Goals: Goals established at orientation with interventions provided to work toward goal.     Personal Goals and Risk Factors at Admission - 04/24/16 1614      Core Components/Risk Factors/Patient Goals on Admission    Weight Management Obesity;Yes   Intervention Weight Management: Develop a combined nutrition and exercise program designed to reach desired caloric intake, while maintaining appropriate intake of nutrient and fiber, sodium and fats, and appropriate energy expenditure  required for the weight goal.;Obesity: Provide education and appropriate resources to help participant work on and attain dietary goals.;Weight Management/Obesity: Establish reasonable short term and long term weight goals.   Admit Weight 235 lb 3.7 oz (106.7 kg)   Expected Outcomes Short Term: Continue to assess and modify interventions until short term weight is achieved;Long Term: Adherence to nutrition and physical activity/exercise program aimed toward attainment of established weight goal;Weight Loss: Understanding of general recommendations for a balanced deficit meal plan, which promotes 1-2 lb weight loss per week and includes a negative energy balance of [phone] kcal/d;Understanding recommendations for meals to include 15-35% energy as protein, 25-35% energy from fat, 35-60% energy from carbohydrates, less than 200mg  of dietary cholesterol, 20-35 gm of total fiber daily   Sedentary Yes   Intervention Provide advice, education, support and counseling about physical activity/exercise needs.;Develop an individualized exercise prescription for aerobic and resistive training based on initial evaluation findings, risk stratification, comorbidities and participant's personal goals.   Expected Outcomes Achievement of increased cardiorespiratory fitness and enhanced flexibility, muscular endurance and strength shown through measurements of functional capacity and personal statement of participant.       Personal Goals Discharge:     Goals and Risk Factor Review    Row Name 05/23/16 1746 06/22/16 1421           Core Components/Risk Factors/Patient Goals Review   Personal Goals Review Sedentary;Other Sedentary;Other      Review Doing well with exercise at cardiac rehab. Increasing workloads as tolerated. Unable to reassess goals due to premature discharge. Pt plans to continue walking on her own at this time.      Expected Outcomes Achieve weight loss goals and improve strength and stamina  through regular aerobic exercise and resistance training. Continue exercise independently at this time.         Nutrition & Weight - Outcomes:     Pre Biometrics - 04/24/16 1506      Pre Biometrics   Height 5' 2.25" (1.581 m)   Weight 235 lb 3.7 oz (106.7 kg)  Waist Circumference 44 inches   Hip Circumference 48.25 inches   Waist to Hip Ratio 0.91 %   BMI (Calculated) 42.8   Triceps Skinfold 47.5 mm   Grip Strength 28.5 kg   Flexibility 0 in   Single Leg Stand 11.21 seconds         Post Biometrics - 06/19/16 1412       Post  Biometrics   Height 5' 2.25" (1.581 m)   Weight 235 lb 10.8 oz (106.9 kg)   BMI (Calculated) 42.8      Nutrition:     Nutrition Therapy & Goals - 05/02/16 1354      Nutrition Therapy   Diet Carb Modified, Therapeutic Lifestyle Changes     Personal Nutrition Goals   Personal Goal #1 1-2 lb wt loss per week to a goal wt loss of 6-24 lb at graduation from Cardiac Rehab     Intervention Plan   Intervention Prescribe, educate and counsel regarding individualized specific dietary modifications aiming towards targeted core components such as weight, hypertension, lipid management, diabetes, heart failure and other comorbidities.   Expected Outcomes Short Term Goal: Understand basic principles of dietary content, such as calories, fat, sodium, cholesterol and nutrients.;Long Term Goal: Adherence to prescribed nutrition plan.      Nutrition Discharge:     Nutrition Assessments - 05/02/16 1355      MEDFICTS Scores   Pre Score 46      Education Questionnaire Score:     Knowledge Questionnaire Score - 05/02/16 1356      Knowledge Questionnaire Score   Pre Score 20/24     DM test returned incomplete      Alisah's last day of participation was on 05/30/2016. Latera has returned to working 6 hours a day and wants to be discharged from the program at this time.Sayler hopes that she may be able to finish the program at a later date and time.Gladstone Lighter, RN,BSN 07/17/2016 11:49 AM

## 2016-06-20 ENCOUNTER — Encounter (HOSPITAL_COMMUNITY): Payer: BLUE CROSS/BLUE SHIELD

## 2016-06-22 ENCOUNTER — Encounter (HOSPITAL_COMMUNITY): Payer: BLUE CROSS/BLUE SHIELD

## 2016-06-25 ENCOUNTER — Encounter (HOSPITAL_COMMUNITY): Payer: BLUE CROSS/BLUE SHIELD

## 2016-06-27 ENCOUNTER — Encounter (HOSPITAL_COMMUNITY): Payer: BLUE CROSS/BLUE SHIELD

## 2016-06-27 ENCOUNTER — Other Ambulatory Visit: Payer: Self-pay | Admitting: Family Medicine

## 2016-06-28 ENCOUNTER — Ambulatory Visit (INDEPENDENT_AMBULATORY_CARE_PROVIDER_SITE_OTHER): Payer: BLUE CROSS/BLUE SHIELD | Admitting: Family Medicine

## 2016-06-28 ENCOUNTER — Ambulatory Visit: Payer: BLUE CROSS/BLUE SHIELD | Admitting: Family Medicine

## 2016-06-28 ENCOUNTER — Encounter: Payer: Self-pay | Admitting: Family Medicine

## 2016-06-28 DIAGNOSIS — I10 Essential (primary) hypertension: Secondary | ICD-10-CM

## 2016-06-28 DIAGNOSIS — F32A Depression, unspecified: Secondary | ICD-10-CM

## 2016-06-28 DIAGNOSIS — F329 Major depressive disorder, single episode, unspecified: Secondary | ICD-10-CM | POA: Diagnosis not present

## 2016-06-28 DIAGNOSIS — E1142 Type 2 diabetes mellitus with diabetic polyneuropathy: Secondary | ICD-10-CM | POA: Diagnosis not present

## 2016-06-28 DIAGNOSIS — R238 Other skin changes: Secondary | ICD-10-CM

## 2016-06-28 DIAGNOSIS — R4589 Other symptoms and signs involving emotional state: Secondary | ICD-10-CM

## 2016-06-28 DIAGNOSIS — E1159 Type 2 diabetes mellitus with other circulatory complications: Secondary | ICD-10-CM | POA: Diagnosis not present

## 2016-06-28 DIAGNOSIS — R233 Spontaneous ecchymoses: Secondary | ICD-10-CM

## 2016-06-28 MED ORDER — DULOXETINE HCL 30 MG PO CPEP: 30.0000 mg | ORAL_CAPSULE | Freq: Every day | ORAL | 2 refills | Status: DC

## 2016-06-28 MED ORDER — INSULIN GLARGINE 100 UNIT/ML SOLOSTAR PEN: 30.0000 [IU] | PEN_INJECTOR | Freq: Every day | SUBCUTANEOUS | 1 refills | Status: DC

## 2016-06-28 MED ORDER — INSULIN GLARGINE 100 UNIT/ML SOLOSTAR PEN
30.0000 [IU] | PEN_INJECTOR | Freq: Every day | SUBCUTANEOUS | 1 refills | Status: DC
Start: 2016-06-28 — End: 2016-06-28

## 2016-06-28 NOTE — Progress Notes (Signed)
   Subjective:    Patient ID: [name], female    DOB: Feb 15, 1966, 50 y.o.   MRN: [id number]  HPI  50 y/o female presents for routine follow up.  Diabetes - Lantus increased to 25 units daily due to elevated blood sugars at visit a few weeks ago, blood sugars still around 300  Bruising - seen by Dr. Sydnee Cabal a few weeks ago, she is part of cardiology TWILIGHT study where she may be assigned to a blood thinner, bruising is slowly improving, still has "nodules" under areas of bruising  Leg Pain - patient reports daily leg pain, she manages a self-checkout and is on legs most of the day, has attempted Tylenol 8 hour with minimal relief, feels fatigue/tiredness in legs; previously attempted Tramadol for pain, reports swelling, has prescription for compression stockings (has not started)  Neuropathy - Lyrica 150 mg BID, reports continued paraesthesias in feet, sharp pains when she lays down  Depressed - due to medical problems, no sleeping well, reports falling asleep throughout the day, denies HI or SI, no hallucinations  Social - nonsmoker  Review of Systems  Constitutional: Negative for chills and fever.  Respiratory: Negative for chest tightness and shortness of breath.   Cardiovascular: Positive for leg swelling. Negative for chest pain.       Objective:   Physical Exam BP (!) 157/73   Pulse 70   Temp 98.3 F (36.8 C) (Oral)   Wt 231 lb (104.8 kg)   BMI 41.91 kg/m  Gen: pleasant female, NAD Cardiac: RRR, S1 and S2 present, no murmur Resp: CTAB, normal effort Ext: 1+ edema to knees Foot exam: pulses intact, no ulcerations, mildly decreased sensation to monofilament  Reviewed recent coagulation labs which were in the normal range.      Assessment & Plan:  Essential hypertension Mildly elevated today. Likely due to pain.  -as controlled at last visit no changes in regimen today -monitor at subsequent visits  Diabetes mellitus, type 2 (HCC) Fasting blood sugars still  uncontrolled (300's) Increase Lantus to 30 units daily Close followup  Diabetic peripheral neuropathy (HCC) Uncontrolled with Gabapentin 150 mg BID -start trial of Cymbalta 30 mg daily in addition to Gabapentin  Abnormal bruising Resolving. Patient has upcoming appointment with Cardiology to discuss blood thinners.   Depression Patient has depression likely due to multiple medical issues and slow improvement after most recent hospitalization. -trial of Cymbalta 30 mg daily

## 2016-06-28 NOTE — Assessment & Plan Note (Signed)
Uncontrolled with Gabapentin 150 mg BID -start trial of Cymbalta 30 mg daily in addition to Gabapentin

## 2016-06-28 NOTE — Assessment & Plan Note (Signed)
Resolving. Patient has upcoming appointment with Cardiology to discuss blood thinners.

## 2016-06-28 NOTE — Assessment & Plan Note (Signed)
Patient has depression likely due to multiple medical issues and slow improvement after most recent hospitalization. -trial of Cymbalta 30 mg daily

## 2016-06-28 NOTE — Patient Instructions (Signed)
It was nice to see you today.  Diabetes - increase the Lantus to 30 units daily  Leg Pain - start Cymbalta (mood and pain medication), wear compression stockings at work as swelling can cause pain, purchase a nice pair of shoes to wear at work (for [employer])  Neuropathy - trial of Cymbalta 30 mg daily  Please make a follow up appointment in 2-3 weeks to check blood pressure, follow up on leg pain, follow up on Diabetes, and perform pap smear.

## 2016-06-28 NOTE — Assessment & Plan Note (Signed)
Mildly elevated today. Likely due to pain.  -as controlled at last visit no changes in regimen today -monitor at subsequent visits

## 2016-06-28 NOTE — Progress Notes (Deleted)
50 y.o. year old female presents for well woman/preventative visit and annual GYN examination.  Acute Concerns:   Diet:  Exercise:  Sexual/Birth History:  Birth Control:  POA/Living Will:  Social:  Social History   Social History  . Marital status: Married    Spouse name: N/A  . Number of children: 1  . Years of education: N/A   Social History Main Topics  . Smoking status: Never Smoker  . Smokeless tobacco: Never Used  . Alcohol use Yes     Comment: 03/14/2016 "I'll have a couple drinks on major holidays"  . Drug use:     Types: Marijuana     Comment: 03/14/2016 "last use was in 02/2016"  . Sexual activity: Not Currently    Birth control/ protection: None   Other Topics Concern  . Not on file   Social History Narrative   Lives with husband.  Has one daughter.  Work full time at [employer].   Some high school education.        Mother's history is unknown patient raised by paternal grandmother     Immunization: Immunization History  Administered Date(s) Administered  . Influenza,inj,Quad PF,36+ Mos 12/18/2013  . Pneumococcal Polysaccharide-23 08/29/2012, 11/19/2013  . Tdap 12/18/2013    Cancer Screening:  Pap Smear:   Mammogram: @MAMMOFINDINGS @  Colonoscopy:   Dexa:{dexa:[id number]::"DEXA scan"}.    Physical Exam: VITALS: Reviewed GEN: Pleasant female, NAD HEENT: Normocephalic, PERRL, EOMI, no scleral icterus, bilateral TM pearly grey, nasal septum midline, MMM, uvula midline, no anterior or posterior lymphadenopathy, no thyromegaly CARDIAC:RRR, S1 and S2 present, no murmur, no heaves/thrills RESP: CTAB, normal effort BREAST:Exam performed in the presence of a chaperone. No masses. No nipple discharge. No axillary lymphadenopathy. ABD: soft, no tenderness, normal bowel sounds GU/GYN:Exam performed in the presence of a chaperone. Normal external genitalia. Cervix unremarkable. Bimanual exam identified no masses EXT: No edema, 2+ radial and DP  pulses SKIN: no rash  Reviewed coagulation studies from a few weeks ago. Everything in normal range.   ASSESSMENT & PLAN: 50 y.o. female presents for annual well woman/preventative exam and GYN exam. Please see problem specific assessment and plan.   No problem-specific Assessment & Plan notes found for this encounter.

## 2016-06-28 NOTE — Assessment & Plan Note (Signed)
Fasting blood sugars still uncontrolled (300's) Increase Lantus to 30 units daily Close followup

## 2016-06-29 ENCOUNTER — Encounter (HOSPITAL_COMMUNITY): Payer: BLUE CROSS/BLUE SHIELD

## 2016-06-29 ENCOUNTER — Encounter (HOSPITAL_BASED_OUTPATIENT_CLINIC_OR_DEPARTMENT_OTHER): Payer: BLUE CROSS/BLUE SHIELD

## 2016-07-02 ENCOUNTER — Encounter (HOSPITAL_COMMUNITY): Payer: BLUE CROSS/BLUE SHIELD

## 2016-07-04 ENCOUNTER — Encounter (HOSPITAL_COMMUNITY): Payer: BLUE CROSS/BLUE SHIELD

## 2016-07-06 ENCOUNTER — Encounter (HOSPITAL_COMMUNITY): Payer: BLUE CROSS/BLUE SHIELD

## 2016-07-09 ENCOUNTER — Encounter (HOSPITAL_COMMUNITY): Payer: BLUE CROSS/BLUE SHIELD

## 2016-07-09 IMAGING — CR DG CHEST 2V
2 series · 2 of 2 positions shown · non-contrast
Comparison: 03/10/2016.  02/23/2016.

CLINICAL DATA: Productive cough.

EXAM:
CHEST  2 VIEW

[w chest pa]
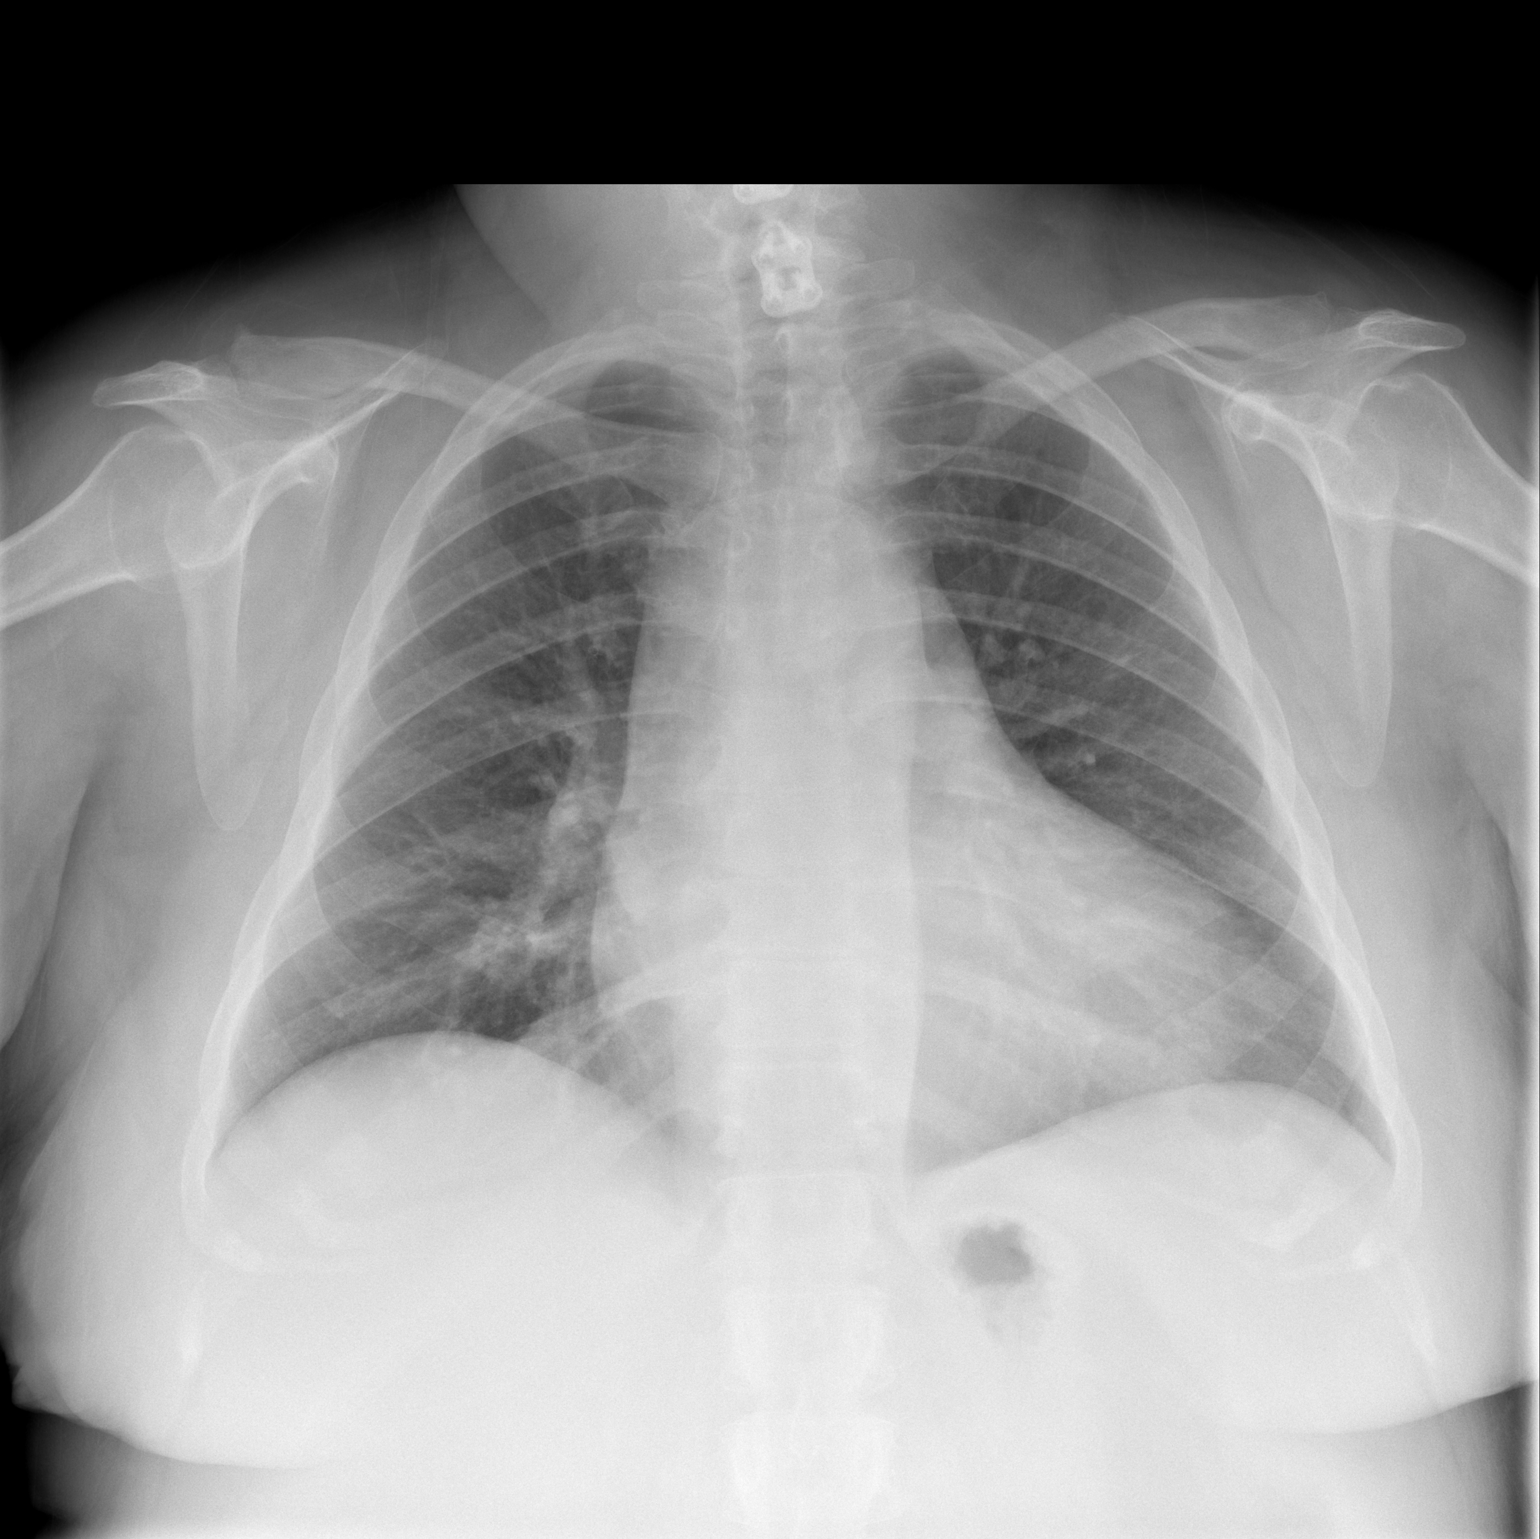

[w chest lat]
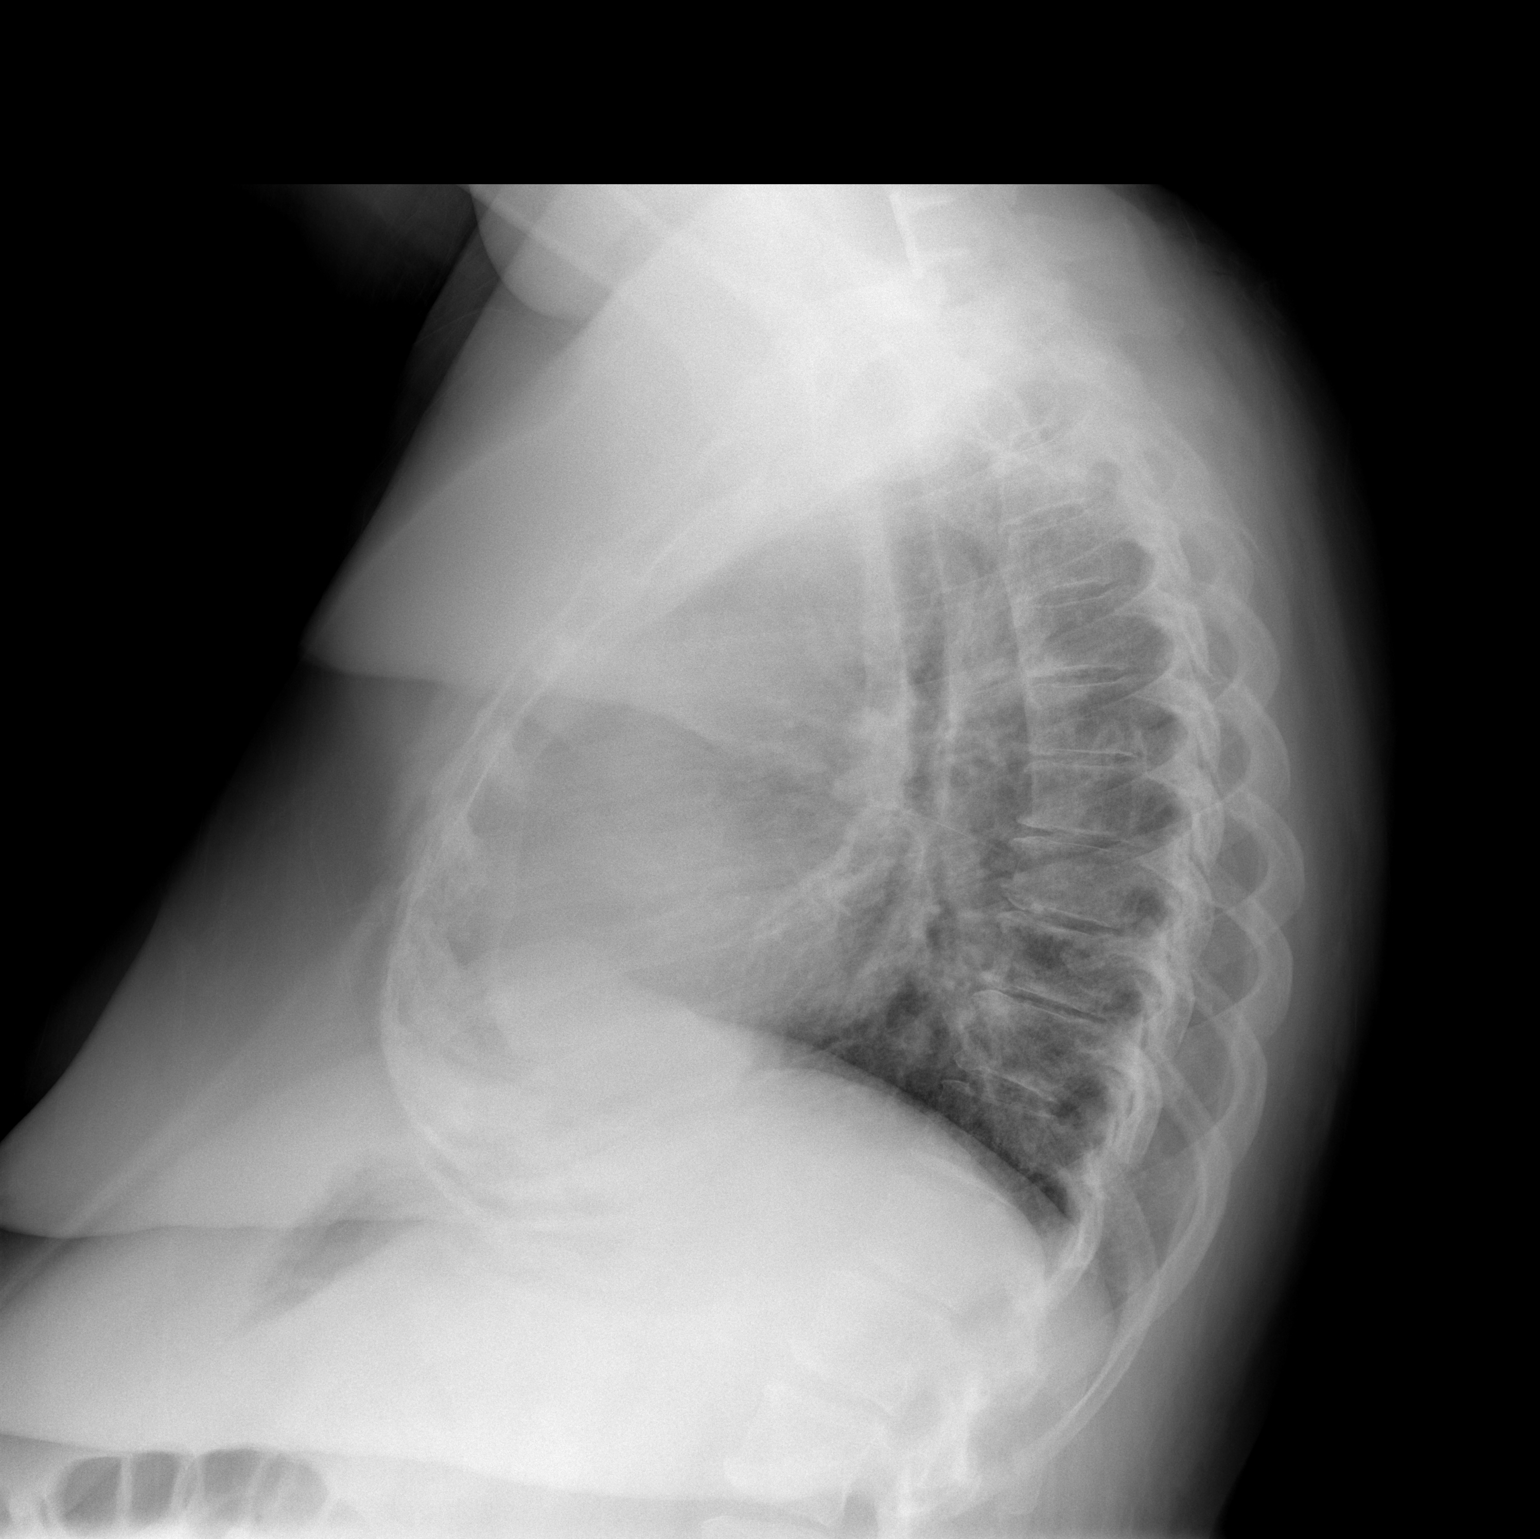

[2 of 2 positions shown; findings below may reference images not displayed]

FINDINGS: Mediastinum hilar structures are normal. Stable cardiomegaly. Low
lung volumes with mild bibasilar atelectasis, improved prior exam.
No pleural effusion or pneumothorax. Prior cervical spine fusion .
IMPRESSION: 1. Low lung volumes with mild bibasilar atelectasis, improved from
prior exam.

2. Stable cardiomegaly.  No pulmonary venous congestion.

## 2016-07-11 ENCOUNTER — Encounter (HOSPITAL_COMMUNITY): Payer: BLUE CROSS/BLUE SHIELD

## 2016-07-13 ENCOUNTER — Encounter (HOSPITAL_COMMUNITY): Payer: BLUE CROSS/BLUE SHIELD

## 2016-07-16 ENCOUNTER — Encounter (HOSPITAL_COMMUNITY): Payer: BLUE CROSS/BLUE SHIELD

## 2016-07-16 ENCOUNTER — Encounter: Payer: Self-pay | Admitting: Cardiology

## 2016-07-17 ENCOUNTER — Ambulatory Visit (INDEPENDENT_AMBULATORY_CARE_PROVIDER_SITE_OTHER): Payer: BLUE CROSS/BLUE SHIELD | Admitting: Cardiovascular Disease

## 2016-07-17 ENCOUNTER — Encounter: Payer: Self-pay | Admitting: *Deleted

## 2016-07-17 ENCOUNTER — Encounter: Payer: Self-pay | Admitting: Cardiovascular Disease

## 2016-07-17 VITALS — BP 126/72 | HR 80 | Ht 62.0 in | Wt 226.0 lb

## 2016-07-17 DIAGNOSIS — I5022 Chronic systolic (congestive) heart failure: Secondary | ICD-10-CM | POA: Diagnosis not present

## 2016-07-17 DIAGNOSIS — E785 Hyperlipidemia, unspecified: Secondary | ICD-10-CM | POA: Diagnosis not present

## 2016-07-17 DIAGNOSIS — I2510 Atherosclerotic heart disease of native coronary artery without angina pectoris: Secondary | ICD-10-CM

## 2016-07-17 DIAGNOSIS — Z006 Encounter for examination for normal comparison and control in clinical research program: Secondary | ICD-10-CM

## 2016-07-17 DIAGNOSIS — I10 Essential (primary) hypertension: Secondary | ICD-10-CM

## 2016-07-17 MED ORDER — ATORVASTATIN CALCIUM 20 MG PO TABS: 20.0000 mg | ORAL_TABLET | Freq: Every day | ORAL | 3 refills | Status: DC

## 2016-07-17 NOTE — Progress Notes (Signed)
TWILIGHT Research study month 4 telephone follow up visit completed. Patient compliant with research provided medication. Denies any bleeding or other adverse events. Questions encouraged and answered.

## 2016-07-17 NOTE — Patient Instructions (Signed)
Medication Instructions:  Your physician has recommended you make the following change in your medication:  1. START Atorvastatin 20mg  take one tablet by mouth every evening  Labwork: No new orders.   Testing/Procedures: No new orders.   Follow-Up: Your physician wants you to follow-up in: 6 MONTHS with Dr Kirke Corin.  You will receive a reminder letter in the mail two months in advance. If you don't receive a letter, please call our office to schedule the follow-up appointment.   Any Other Special Instructions Will Be Listed Below (If Applicable).     If you need a refill on your cardiac medications before your next appointment, please call your pharmacy.

## 2016-07-17 NOTE — Progress Notes (Signed)
Cardiology Office Note   Date:  07/17/2016   ID:  [name], DOB [birth year], MRN [id number]  PCP:  Jamie Dawn, MD  Cardiologist:   Jamie Sacramento, MD   Chief Complaint  Patient presents with  . Follow-up    pt wants to discuss a study medication      History of Present Illness: [name] is a 50 y.o. female who presents for  A follow-up visit regarding coronary artery disease and chronic systolic heart failure. She has known history of hypertension, type 2 diabetes and obesity. She was hospitalized on April 14 at [facility] with respiratory failure due to acute systolic heart failure with non-ST elevation myocardial infarction. Peak troponin was 7. Echocardiogram showed an ejection fraction of 40-45% with apical and inferoapical akinesis. There was a questionable mass on the mitral valve which was ruled out by TEE. She underwent cardiac catheterization during her hospitalization which showed severe two-vessel coronary artery disease with 99% mid to distal LAD stenosis, 99% ostial first diagonal stenosis and 85% mid RCA stenosis. She underwent successful balloon angioplasty of the first diagonal and drug-eluting stent placement to the mid to distal LAD. She developed left bundle branch block when the left ventricle was engaged and this persisted throughout her hospitalization. She was subsequently discharged home but returned with respiratory distress and stridor which was found to be due to subglottic edema. She was reintubated. This was felt to be due to her recent intubation and possibly due to ACE inhibitor use. She was discharged home on prednisone. I proceeded with staged RCA PCI and drug-eluting stent placement in May without complications. She has been doing well and has been attending cardiac rehabilitation with no recurrent chest pain.  She has been doing well and denies any chest pain or shortness of breath. She has experienced increased bruising but nothing unusual. Her  cough improved after discontinuing lisinopril. She is back to work. Atorvastatin is not on her medication list for unclear reasons.   Past Medical History:  Diagnosis Date  . ARF (acute respiratory failure) ([place]) 02/10/2016  . CAD (coronary artery disease) 02/10/2016   a. 03/14/2016 Staged PCI with DES to mid RCA. b. NSTEMI 02/2016: DES to mid-LAD, POBA to 1st Diag, 85% stenosis of RCA noted with staged PCI recommended  . Chronic combined systolic (congestive) and diastolic (congestive) heart failure ([place])    a. Echo 02/2016: EF 40-45% w/ Grade 2 DD. 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  . Flash pulmonary edema ([place]) 02/10/2016   requiring intubation  . Hypertension   . Meralgia paresthetica of right side 12/18/2013  . Myocardial infarction ([place]) 02/2016  . NSTEMI (non-ST elevated myocardial infarction) ([place])   . Obesity (BMI 30-39.9) 04/03/2016  . Orthostatic hypotension 04/27/2014  . OSA on CPAP   . Required emergent intubation 02/10/2016  . Type II diabetes mellitus ([place])     Past Surgical History:  Procedure Laterality Date  . ANTERIOR CERVICAL DECOMP/DISCECTOMY FUSION  08/28/2012   Procedure: ANTERIOR CERVICAL DECOMPRESSION/DISCECTOMY FUSION 2 LEVELS;  Surgeon: Jamie Connors, MD;  Location: [place] [facility];  Service: Neurosurgery;  Laterality: N/A;  Cervical four-five, cervical six-seven Anterior cervical decompression/diskectomy/fusion/LifeNet Bone/Trestle plate  . BACK SURGERY    . BREAST CYST EXCISION Right   . CARDIAC CATHETERIZATION N/A 02/15/2016   Procedure: Left Heart Cath and Coronary Angiography;  Surgeon: Jamie Hampshire, MD;  Location: [place] [facility];  Service: Cardiovascular;  Laterality: N/A;  .  CARDIAC CATHETERIZATION N/A 02/15/2016   Procedure: Coronary Stent Intervention;  Surgeon: Jamie Hampshire, MD;  Location: [place] [facility];  Service: Cardiovascular;  Laterality: N/A;  . CARDIAC CATHETERIZATION N/A 02/15/2016   Procedure: Coronary  Balloon Angioplasty;  Surgeon: Jamie Hampshire, MD;  Location: [place] [facility];  Service: Cardiovascular;  Laterality: N/A;  . CARDIAC CATHETERIZATION N/A 03/14/2016   Procedure: Coronary Stent Intervention;  Surgeon: Jamie Hampshire, MD;  Location: [place] [facility];  Service: Cardiovascular;  Laterality: N/A;  . CORONARY ANGIOPLASTY WITH STENT PLACEMENT    . SPLIT NIGHT STUDY  04/07/2016     Current Outpatient Prescriptions  Medication Sig Dispense Refill  . polyethylene glycol (MIRALAX / GLYCOLAX) packet Take 17 g by mouth as needed for mild constipation or moderate constipation.     . pregabalin (LYRICA) 150 MG capsule Take 1 capsule (150 mg total) by mouth 2 (two) times daily. (Patient taking differently: Take 150 mg by mouth daily as needed. ) 60 capsule 2  . senna-docusate (SENOKOT-S) 8.6-50 MG tablet Take 2 tablets by mouth daily. As needed for constipation. (Patient taking differently: Take 2 tablets by mouth daily as needed. As needed for constipation.) 60 tablet 1  . acetaminophen (TYLENOL) 325 MG tablet Take 2 tablets (650 mg total) by mouth every 6 (six) hours as needed for mild pain (or Fever >/= 101). 60 tablet 0  . albuterol (PROVENTIL HFA;VENTOLIN HFA) 108 (90 Base) MCG/ACT inhaler Inhale 2 puffs into the lungs every 6 (six) hours as needed for wheezing or shortness of breath. 1 Inhaler 0  . AMBULATORY NON FORMULARY MEDICATION Take 90 mg by mouth 2 (two) times daily. Medication Name: BRILINTA 90 mg BID (TWILIGHT Research study provided)    . AMBULATORY NON FORMULARY MEDICATION Take 81 mg by mouth daily. Medication Name: ASPIRIN 81 mg daily or PLACEBO (TWILIGHT Research study)    . amLODipine (NORVASC) 10 MG tablet TAKE ONE TABLET BY MOUTH ONCE DAILY 90 tablet 1  . blood glucose meter kit and supplies KIT Dispense based on patient and insurance preference. Use up to four times daily as directed. (FOR ICD-9 250.00, 250.01). 1 each 0  . carvedilol (COREG) 12.5 MG tablet TAKE ONE  TABLET BY MOUTH TWICE DAILY WITH MEALS 60 tablet 2  . dextromethorphan-guaiFENesin (MUCINEX DM) 30-600 MG 12hr tablet Take 1 tablet by mouth 2 (two) times daily. 60 tablet 1  . docusate sodium (COLACE) 100 MG capsule Take 1 capsule (100 mg total) by mouth every 12 (twelve) hours. 30 capsule 0  . DULoxetine (CYMBALTA) 30 MG capsule Take 1 capsule (30 mg total) by mouth daily. 30 capsule 2  . ferrous sulfate 325 (65 FE) MG tablet Take 1 tablet (325 mg total) by mouth daily with breakfast. 90 tablet 1  . hydrochlorothiazide (MICROZIDE) 12.5 MG capsule Take 1 capsule (12.5 mg total) by mouth daily. 30 capsule 2  . Insulin Glargine (LANTUS SOLOSTAR) 100 UNIT/ML Solostar Pen Inject 30 Units into the skin daily at 10 pm. 5 pen 1  . metFORMIN (GLUCOPHAGE) 1000 MG tablet Take 1 tablet (1,000 mg total) by mouth 2 (two) times daily with a meal. 60 tablet 3   No current facility-administered medications for this visit.     Allergies:   Ace inhibitors    Social History:  The patient  reports that she has never smoked. She has never used smokeless tobacco. She reports that she drinks alcohol. She reports that she uses drugs, including Marijuana.  Family History:  The patient's family history includes Diabetes in her paternal grandmother; Heart disease in her paternal aunt; Heart disease (age of onset: 16) in her paternal grandmother; Heart disease (age of onset: 61) in her father; Hypertension in her paternal grandmother.    ROS:  Please see the history of present illness.   Otherwise, review of systems are positive for none.   All other systems are reviewed and negative.    PHYSICAL EXAM: VS:  BP 126/72 (BP Location: Left Arm, Patient Position: Sitting, Cuff Size: Large)   Pulse 80   Ht _0  (1.575 m)   Wt 226 lb (102.5 kg)   SpO2 97%   BMI 41.34 kg/m  , BMI Body mass index is 41.34 kg/m. GEN: Well nourished, well developed, in no acute distress HEENT: normal Neck: no JVD, carotid bruits, or  masses Cardiac: RRR; no murmurs, rubs, or gallops,no edema  Respiratory:  clear to auscultation bilaterally, normal work of breathing GI: soft, nontender, nondistended, + BS MS: no deformity or atrophy Skin: warm and dry, no rash Neuro:  Strength and sensation are intact Psych: euthymic mood, full affect   EKG:  EKG is not ordered today.    Recent Labs: 02/25/2016: Magnesium 2.1 03/22/2016: Brain Natriuretic Peptide 64.0; BUN 9; Creat 0.89; Potassium 4.1; Sodium 139 04/19/2016: TSH 1.66 05/15/2016: ALT 16 06/18/2016: Hemoglobin 10.6; Platelets 285    Lipid Panel    Component Value Date/Time   CHOL 117 (L) 05/15/2016 0952   TRIG 250 (H) 05/15/2016 0952   HDL 32 (L) 05/15/2016 0952   CHOLHDL 3.7 05/15/2016 0952   VLDL 50 (H) 05/15/2016 0952   LDLCALC 35 05/15/2016 0952      Wt Readings from Last 3 Encounters:  07/17/16 226 lb (102.5 kg)  06/28/16 231 lb (104.8 kg)  06/19/16 235 lb 10.8 oz (106.9 kg)       ASSESSMENT AND PLAN:  1.  Coronary artery disease involving native coronary arteries Without angina:  She is doing well overall with improvement in symptoms after RCA PCI. She has mild bruising with no serious bleeding which is expected with dual antiplatelet therapy. This has nothing to do with the twilight research study as she needs to be on dual antiplatelet therapy anyway.  2. Chronic systolic heart failure: Ejection fraction was 40-45%. She appears to be euvolemic without diuretics.   3. Obstructive sleep apnea: Continue CPAP.  4. Hyperlipidemia:  I resumed atorvastatin at the lower dose of 20 mg once daily. There is a strong indication for treatment with atorvastatin due to her known history of coronary artery disease and diabetes. It is possible that atorvastatin was discontinued due to mild myalgia. Hopefully, she will tolerate smaller doses. LDL was she was in treatment was 35. Triglyceride was mildly elevated and I talked with her about the importance of diet  and exercise.   Disposition:   FU with me in 6 months  Signed,  Jamie Sacramento, MD  07/17/2016 8:10 AM    [place]

## 2016-07-18 ENCOUNTER — Encounter (HOSPITAL_COMMUNITY): Payer: BLUE CROSS/BLUE SHIELD

## 2016-07-18 ENCOUNTER — Other Ambulatory Visit: Payer: Self-pay | Admitting: Family Medicine

## 2016-07-18 DIAGNOSIS — I10 Essential (primary) hypertension: Secondary | ICD-10-CM

## 2016-07-20 ENCOUNTER — Encounter (HOSPITAL_COMMUNITY): Payer: BLUE CROSS/BLUE SHIELD

## 2016-07-21 ENCOUNTER — Encounter: Payer: Self-pay | Admitting: Cardiology

## 2016-07-23 ENCOUNTER — Encounter (HOSPITAL_COMMUNITY): Payer: BLUE CROSS/BLUE SHIELD

## 2016-07-25 ENCOUNTER — Encounter (HOSPITAL_COMMUNITY): Payer: BLUE CROSS/BLUE SHIELD

## 2016-07-27 ENCOUNTER — Encounter (HOSPITAL_COMMUNITY): Payer: BLUE CROSS/BLUE SHIELD

## 2016-07-30 ENCOUNTER — Encounter (HOSPITAL_COMMUNITY): Payer: BLUE CROSS/BLUE SHIELD

## 2016-08-01 ENCOUNTER — Encounter (HOSPITAL_COMMUNITY): Payer: BLUE CROSS/BLUE SHIELD

## 2016-08-03 ENCOUNTER — Encounter (HOSPITAL_COMMUNITY): Payer: BLUE CROSS/BLUE SHIELD

## 2016-08-06 ENCOUNTER — Encounter (HOSPITAL_COMMUNITY): Payer: BLUE CROSS/BLUE SHIELD

## 2016-08-08 ENCOUNTER — Encounter (HOSPITAL_COMMUNITY): Payer: BLUE CROSS/BLUE SHIELD

## 2016-08-10 ENCOUNTER — Encounter (HOSPITAL_COMMUNITY): Payer: BLUE CROSS/BLUE SHIELD

## 2016-08-13 ENCOUNTER — Encounter (HOSPITAL_COMMUNITY): Payer: BLUE CROSS/BLUE SHIELD

## 2016-08-15 ENCOUNTER — Encounter (HOSPITAL_COMMUNITY): Payer: BLUE CROSS/BLUE SHIELD

## 2016-08-17 ENCOUNTER — Encounter (HOSPITAL_COMMUNITY): Payer: BLUE CROSS/BLUE SHIELD

## 2016-08-18 ENCOUNTER — Other Ambulatory Visit: Payer: Self-pay | Admitting: Family Medicine

## 2016-08-20 ENCOUNTER — Encounter (HOSPITAL_COMMUNITY): Payer: BLUE CROSS/BLUE SHIELD

## 2016-08-20 ENCOUNTER — Ambulatory Visit: Payer: BLUE CROSS/BLUE SHIELD | Admitting: Cardiology

## 2016-08-20 ENCOUNTER — Ambulatory Visit (INDEPENDENT_AMBULATORY_CARE_PROVIDER_SITE_OTHER): Payer: BLUE CROSS/BLUE SHIELD | Admitting: Cardiology

## 2016-08-20 VITALS — BP 144/72 | HR 72 | Ht 62.5 in | Wt 234.8 lb

## 2016-08-20 DIAGNOSIS — I10 Essential (primary) hypertension: Secondary | ICD-10-CM

## 2016-08-20 DIAGNOSIS — E669 Obesity, unspecified: Secondary | ICD-10-CM

## 2016-08-20 DIAGNOSIS — G4733 Obstructive sleep apnea (adult) (pediatric): Secondary | ICD-10-CM

## 2016-08-20 NOTE — Progress Notes (Signed)
Cardiology Office Note    Date:  08/20/2016   ID:  [name], DOB 1966/04/11, MRN [id number]  PCP:  Jamie Dawn, MD  Cardiologist:  Jamie Him, MD   Chief Complaint  Patient presents with  . Sleep Apnea  . Hypertension    History of Present Illness:  [name] is a 50 y.o. female who has a history of CAD, chronic systolic CHF and OSA who is on CPAP.  She is doing well. She was seen by me for OSA last OV and had a history of OSA but was not on CPAP.  We ordered a sleep study showing mild OSA with an AHI of 8.6/hr and oxygen desaturations as low as 87% with mild snoring.  She underwent CPAP to 16cm H2O with full face mask.  She is now here for followup.  She is doing well with her CPAP device.  She tolerates the full face mask and feels the pressure is adequate.  She does not think that she snores.  Since going back on CPAP she feels more rested in the am but occasionally does nap due to daytime sleepiness.  She still gets up at night to go to the bathroom.  She denies any significant mouth dryness or congestion.  Past Medical History:  Diagnosis Date  . ARF (acute respiratory failure) ([place]) 02/10/2016  . CAD (coronary artery disease) 02/10/2016   a. 03/14/2016 Staged PCI with DES to mid RCA. b. NSTEMI 02/2016: DES to mid-LAD, POBA to 1st Diag, 85% stenosis of RCA noted with staged PCI recommended  . Chronic combined systolic (congestive) and diastolic (congestive) heart failure ([place])    a. Echo 02/2016: EF 40-45% w/ Grade 2 DD. 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  . Flash pulmonary edema ([place]) 02/10/2016   requiring intubation  . Hypertension   . Meralgia paresthetica of right side 12/18/2013  . Myocardial infarction 02/2016  . NSTEMI (non-ST elevated myocardial infarction) ([place])   . Obesity (BMI 30-39.9) 04/03/2016  . Orthostatic hypotension 04/27/2014  . OSA on CPAP   . Required emergent intubation 02/10/2016  . Type II diabetes mellitus  ([place])     Past Surgical History:  Procedure Laterality Date  . ANTERIOR CERVICAL DECOMP/DISCECTOMY FUSION  08/28/2012   Procedure: ANTERIOR CERVICAL DECOMPRESSION/DISCECTOMY FUSION 2 LEVELS;  Surgeon: Jamie Connors, MD;  Location: [place] [facility];  Service: Neurosurgery;  Laterality: N/A;  Cervical four-five, cervical six-seven Anterior cervical decompression/diskectomy/fusion/LifeNet Bone/Trestle plate  . BACK SURGERY    . BREAST CYST EXCISION Right   . CARDIAC CATHETERIZATION N/A 02/15/2016   Procedure: Left Heart Cath and Coronary Angiography;  Surgeon: Jamie Hampshire, MD;  Location: [place] [facility];  Service: Cardiovascular;  Laterality: N/A;  . CARDIAC CATHETERIZATION N/A 02/15/2016   Procedure: Coronary Stent Intervention;  Surgeon: Jamie Hampshire, MD;  Location: [place] [facility];  Service: Cardiovascular;  Laterality: N/A;  . CARDIAC CATHETERIZATION N/A 02/15/2016   Procedure: Coronary Balloon Angioplasty;  Surgeon: Jamie Hampshire, MD;  Location: [place] [facility];  Service: Cardiovascular;  Laterality: N/A;  . CARDIAC CATHETERIZATION N/A 03/14/2016   Procedure: Coronary Stent Intervention;  Surgeon: Jamie Hampshire, MD;  Location: [place] [facility];  Service: Cardiovascular;  Laterality: N/A;  . CORONARY ANGIOPLASTY WITH STENT PLACEMENT    . SPLIT NIGHT STUDY  04/07/2016    Current Medications: Outpatient Medications Prior to Visit  Medication Sig Dispense Refill  . acetaminophen (TYLENOL) 325  MG tablet Take 2 tablets (650 mg total) by mouth every 6 (six) hours as needed for mild pain (or Fever >/= 101). 60 tablet 0  . albuterol (PROVENTIL HFA;VENTOLIN HFA) 108 (90 Base) MCG/ACT inhaler Inhale 2 puffs into the lungs every 6 (six) hours as needed for wheezing or shortness of breath. 1 Inhaler 0  . AMBULATORY NON FORMULARY MEDICATION Take 90 mg by mouth 2 (two) times daily. Medication Name: BRILINTA 90 mg BID (TWILIGHT Research study provided)    . AMBULATORY NON FORMULARY  MEDICATION Take 81 mg by mouth daily. Medication Name: ASPIRIN 81 mg daily or PLACEBO (TWILIGHT Research study)    . amLODipine (NORVASC) 10 MG tablet TAKE ONE TABLET BY MOUTH ONCE DAILY 90 tablet 1  . blood glucose meter kit and supplies KIT Dispense based on patient and insurance preference. Use up to four times daily as directed. (FOR ICD-9 250.00, 250.01). 1 each 0  . carvedilol (COREG) 12.5 MG tablet TAKE ONE TABLET BY MOUTH TWICE DAILY WITH MEALS 60 tablet 2  . dextromethorphan-guaiFENesin (MUCINEX DM) 30-600 MG 12hr tablet Take 1 tablet by mouth 2 (two) times daily. 60 tablet 1  . docusate sodium (COLACE) 100 MG capsule Take 1 capsule (100 mg total) by mouth every 12 (twelve) hours. 30 capsule 0  . DULoxetine (CYMBALTA) 30 MG capsule Take 1 capsule (30 mg total) by mouth daily. 30 capsule 2  . ferrous sulfate 325 (65 FE) MG tablet Take 1 tablet (325 mg total) by mouth daily with breakfast. 90 tablet 1  . hydrochlorothiazide (MICROZIDE) 12.5 MG capsule TAKE ONE CAPSULE BY MOUTH ONCE DAILY 30 capsule 2  . Insulin Glargine (LANTUS SOLOSTAR) 100 UNIT/ML Solostar Pen Inject 30 Units into the skin daily at 10 pm. 5 pen 1  . metFORMIN (GLUCOPHAGE) 1000 MG tablet TAKE ONE TABLET BY MOUTH TWICE DAILY WITH A MEAL 60 tablet 3  . polyethylene glycol (MIRALAX / GLYCOLAX) packet Take 17 g by mouth as needed for mild constipation or moderate constipation.     . senna-docusate (SENOKOT-S) 8.6-50 MG tablet Take 2 tablets by mouth daily. As needed for constipation. (Patient taking differently: Take 2 tablets by mouth daily as needed. As needed for constipation.) 60 tablet 1  . atorvastatin (LIPITOR) 20 MG tablet Take 1 tablet (20 mg total) by mouth daily. 90 tablet 3  . pregabalin (LYRICA) 150 MG capsule Take 1 capsule (150 mg total) by mouth 2 (two) times daily. (Patient taking differently: Take 150 mg by mouth daily as needed. ) 60 capsule 2   No facility-administered medications prior to visit.       Allergies:   Ace inhibitors   Social History   Social History  . Marital status: Married    Spouse name: N/A  . Number of children: 1  . Years of education: N/A   Social History Main Topics  . Smoking status: Never Smoker  . Smokeless tobacco: Never Used  . Alcohol use Yes     Comment: 03/14/2016 "I'll have a couple drinks on major holidays"  . Drug use:     Types: Marijuana     Comment: 03/14/2016 "last use was in 02/2016"  . Sexual activity: Not Currently    Birth control/ protection: None   Other Topics Concern  . Not on file   Social History Narrative   Lives with husband.  Has one daughter.  Work full time at [employer].   Some high school education.  Mother's history is unknown patient raised by paternal grandmother      Family History:  The patient's family history includes Diabetes in her paternal grandmother; Heart disease in her paternal aunt; Heart disease (age of onset: 45) in her paternal grandmother; Heart disease (age of onset: 34) in her father; Hypertension in her paternal grandmother.   ROS:   Please see the history of present illness.    ROS All other systems reviewed and are negative.  No flowsheet data found.     PHYSICAL EXAM:   VS:  BP (!) 144/72   Pulse 72   Ht 5' 2.5" (1.588 m)   Wt 234 lb 12.8 oz (106.5 kg)   BMI 42.26 kg/m    GEN: Well nourished, well developed, in no acute distress  HEENT: normal  Neck: no JVD, carotid bruits, or masses Cardiac: RRR; no murmurs, rubs, or gallops,no edema.  Intact distal pulses bilaterally.  Respiratory:  clear to auscultation bilaterally, normal work of breathing GI: soft, nontender, nondistended, + BS MS: no deformity or atrophy  Skin: warm and dry, no rash Neuro:  Alert and Oriented x 3, Strength and sensation are intact Psych: euthymic mood, full affect  Wt Readings from Last 3 Encounters:  08/20/16 234 lb 12.8 oz (106.5 kg)  07/17/16 226 lb (102.5 kg)  06/28/16 231 lb (104.8 kg)       Studies/Labs Reviewed:   EKG:  EKG is not ordered today.    Recent Labs: 02/25/2016: Magnesium 2.1 03/22/2016: Brain Natriuretic Peptide 64.0; BUN 9; Creat 0.89; Potassium 4.1; Sodium 139 04/19/2016: TSH 1.66 05/15/2016: ALT 16 06/18/2016: Hemoglobin 10.6; Platelets 285   Lipid Panel    Component Value Date/Time   CHOL 117 (L) 05/15/2016 0952   TRIG 250 (H) 05/15/2016 0952   HDL 32 (L) 05/15/2016 0952   CHOLHDL 3.7 05/15/2016 0952   VLDL 50 (H) 05/15/2016 0952   LDLCALC 35 05/15/2016 0952    Additional studies/ records that were reviewed today include:  CPAP download    ASSESSMENT:    1. OSA (obstructive sleep apnea)   2. Essential hypertension   3. Obesity (BMI 30-39.9)      PLAN:  In order of problems listed above:  OSA - the patient is tolerating PAP therapy well without any problems. The PAP download was reviewed today and showed an AHI of 0.8/hr on 16 cm H2O with 73% compliance in using more than 4 hours nightly.  The patient has been using and benefiting from CPAP use and will continue to benefit from therapy.  2.  HTN - BP controlled on current meds.  Continue amlodipine/BB/diuretic. 3.  I have encouraged Steele to get into a routine exercise program and cut back on carbs and portions.     Medication Adjustments/Labs and Tests Ordered: Current medicines are reviewed at length with the patient today.  Concerns regarding medicines are outlined above.  Medication changes, Labs and Tests ordered today are listed in the Patient Instructions below.  There are no Patient Instructions on file for this visit.   Signed, Jamie Him, MD  08/20/2016 2:11 PM    [facility] [place], [place], [place]  [address] Phone: [phone]; Fax: [phone]

## 2016-08-20 NOTE — Patient Instructions (Signed)

## 2016-08-22 ENCOUNTER — Encounter (HOSPITAL_COMMUNITY): Payer: BLUE CROSS/BLUE SHIELD

## 2016-08-22 ENCOUNTER — Telehealth (HOSPITAL_COMMUNITY): Payer: Self-pay

## 2016-08-22 NOTE — Telephone Encounter (Signed)
-----   Message from Quintella Reichert, MD sent at 03/28/2016  9:04 PM EDT ----- Regarding: RE: is pt ok to start CR? In regards to sleep workup that has no bearing on cardiac rehab.  Any other concerns need to be addressed to his primary cardiologist as I have never seen this patient.  Armanda Magic ----- Message -----    From: Paulita Fujita    Sent: 03/28/2016   5:20 PM      To: Quintella Reichert, MD Subject: is pt ok to start CR?                          Good evening Dr. Mayford Knife,   The above pt has an upcoming appointment in regards to CPAP and sleep apnea. The pt is due to start CR on April 24, 2016. Will the pt be ok to start cardiac rehab at that time?   Thanks  [employer]

## 2016-08-24 ENCOUNTER — Encounter (HOSPITAL_COMMUNITY): Payer: BLUE CROSS/BLUE SHIELD

## 2016-08-24 ENCOUNTER — Other Ambulatory Visit: Payer: Self-pay | Admitting: Family Medicine

## 2016-08-24 DIAGNOSIS — E1159 Type 2 diabetes mellitus with other circulatory complications: Secondary | ICD-10-CM

## 2016-08-27 ENCOUNTER — Encounter (HOSPITAL_COMMUNITY): Payer: BLUE CROSS/BLUE SHIELD

## 2016-08-29 ENCOUNTER — Encounter (HOSPITAL_COMMUNITY): Payer: BLUE CROSS/BLUE SHIELD

## 2016-08-31 ENCOUNTER — Encounter (HOSPITAL_COMMUNITY): Payer: BLUE CROSS/BLUE SHIELD

## 2016-09-10 ENCOUNTER — Telehealth (INDEPENDENT_AMBULATORY_CARE_PROVIDER_SITE_OTHER): Payer: Self-pay | Admitting: *Deleted

## 2016-09-10 DIAGNOSIS — I5042 Chronic combined systolic (congestive) and diastolic (congestive) heart failure: Secondary | ICD-10-CM

## 2016-09-10 DIAGNOSIS — M7989 Other specified soft tissue disorders: Secondary | ICD-10-CM

## 2016-09-10 NOTE — Telephone Encounter (Signed)
Pt. Called stating she was seen in August and got a prescription for socks. Pt has lost this prescription and didn't know if she could have another or if she needed to be seen first. Call back number is [phone]. Also pt. Has accident insurance and wanted a copy of office notes.

## 2016-09-12 NOTE — Telephone Encounter (Signed)
See message concerning Rx for socks. Please Advise. Thank You

## 2016-09-13 ENCOUNTER — Telehealth (INDEPENDENT_AMBULATORY_CARE_PROVIDER_SITE_OTHER): Payer: Self-pay | Admitting: Sports Medicine

## 2016-09-13 MED ORDER — MEDICAL COMPRESSION SOCKS MISC: 99 refills | Status: DC

## 2016-09-13 NOTE — Telephone Encounter (Signed)
Talked with patient concerning Rx for socks and also printed off last office note for patient.

## 2016-09-13 NOTE — Telephone Encounter (Signed)
Patient request a return call regarding the message she was left pertaining to the Rx for the compression socks

## 2016-09-13 NOTE — Telephone Encounter (Signed)
New Rx printed for pt to take to [facility]

## 2016-09-13 NOTE — Telephone Encounter (Signed)
Called and left voicemail advising patient that Rx for compression socks is ready for pick up at the front desk.

## 2016-09-14 ENCOUNTER — Telehealth (INDEPENDENT_AMBULATORY_CARE_PROVIDER_SITE_OTHER): Payer: Self-pay | Admitting: Sports Medicine

## 2016-09-14 NOTE — Telephone Encounter (Signed)
Talked with patient concerning Rx for socks and where she could find the compression socks.

## 2016-09-14 NOTE — Telephone Encounter (Signed)
Patient called advised her insurance company told her she need a product ID number to be able to see if the socks are approved by [employer].  The compression sock is listed as [employer] compression socks  30/40   The number to contact is [phone]

## 2016-09-17 ENCOUNTER — Other Ambulatory Visit: Payer: Self-pay | Admitting: Family Medicine

## 2016-09-17 DIAGNOSIS — I10 Essential (primary) hypertension: Secondary | ICD-10-CM

## 2016-09-19 ENCOUNTER — Encounter: Payer: Self-pay | Admitting: Cardiology

## 2016-09-24 ENCOUNTER — Other Ambulatory Visit: Payer: Self-pay | Admitting: Family Medicine

## 2016-09-30 ENCOUNTER — Emergency Department (HOSPITAL_COMMUNITY): Payer: BLUE CROSS/BLUE SHIELD

## 2016-09-30 ENCOUNTER — Encounter (HOSPITAL_COMMUNITY): Payer: Self-pay

## 2016-09-30 ENCOUNTER — Emergency Department (HOSPITAL_COMMUNITY)
Admission: EM | Admit: 2016-09-30 | Discharge: 2016-09-30 | Disposition: A | Discharge status: Home / Self Care | Payer: BLUE CROSS/BLUE SHIELD | Attending: Emergency Medicine | Admitting: Emergency Medicine

## 2016-09-30 DIAGNOSIS — Y92002 Bathroom of unspecified non-institutional (private) residence single-family (private) house as the place of occurrence of the external cause: Secondary | ICD-10-CM | POA: Insufficient documentation

## 2016-09-30 DIAGNOSIS — W182XXA Fall in (into) shower or empty bathtub, initial encounter: Secondary | ICD-10-CM | POA: Insufficient documentation

## 2016-09-30 DIAGNOSIS — I5042 Chronic combined systolic (congestive) and diastolic (congestive) heart failure: Secondary | ICD-10-CM | POA: Diagnosis not present

## 2016-09-30 DIAGNOSIS — E119 Type 2 diabetes mellitus without complications: Secondary | ICD-10-CM | POA: Diagnosis not present

## 2016-09-30 DIAGNOSIS — S8001XA Contusion of right knee, initial encounter: Secondary | ICD-10-CM | POA: Diagnosis not present

## 2016-09-30 DIAGNOSIS — Z794 Long term (current) use of insulin: Secondary | ICD-10-CM | POA: Insufficient documentation

## 2016-09-30 DIAGNOSIS — Y939 Activity, unspecified: Secondary | ICD-10-CM | POA: Diagnosis not present

## 2016-09-30 DIAGNOSIS — Z955 Presence of coronary angioplasty implant and graft: Secondary | ICD-10-CM | POA: Diagnosis not present

## 2016-09-30 DIAGNOSIS — Y999 Unspecified external cause status: Secondary | ICD-10-CM | POA: Insufficient documentation

## 2016-09-30 DIAGNOSIS — I2510 Atherosclerotic heart disease of native coronary artery without angina pectoris: Secondary | ICD-10-CM | POA: Diagnosis not present

## 2016-09-30 DIAGNOSIS — I252 Old myocardial infarction: Secondary | ICD-10-CM | POA: Diagnosis not present

## 2016-09-30 DIAGNOSIS — S0990XA Unspecified injury of head, initial encounter: Secondary | ICD-10-CM | POA: Diagnosis present

## 2016-09-30 DIAGNOSIS — S0083XA Contusion of other part of head, initial encounter: Secondary | ICD-10-CM | POA: Insufficient documentation

## 2016-09-30 DIAGNOSIS — I110 Hypertensive heart disease with heart failure: Secondary | ICD-10-CM | POA: Diagnosis not present

## 2016-09-30 MED ORDER — NAPROXEN 375 MG PO TABS: 375.0000 mg | ORAL_TABLET | Freq: Two times a day (BID) | ORAL | 0 refills | Status: AC | PRN

## 2016-09-30 NOTE — ED Notes (Signed)
Transported to xray 

## 2016-09-30 NOTE — ED Provider Notes (Signed)
[facility] DEPT Provider Note   CSN: [id number] Arrival date & time: 09/30/16  1457     History   Chief Complaint Chief Complaint  Patient presents with  . fell in shower    HPI [name] is a 50 y.o. female.  HPI 50 year old female with extensive past medical history as below who presents with knee pain and she'll pain. The patient was in the shower approximately an hour ago. She states she lost her footing and fell directly onto her chin and right knee. She extends immediate onset of severe, aching, throbbing pain. Pain is made worse with palpation and any movement of her jaw. Denies any loose teeth. Denies any oral lesions or bleeding. She had no loss of consciousness. She also has mild right neck pain but denies any midline pain. No numbness or weakness of her upper or lower extremities.  Past Medical History:  Diagnosis Date  . ARF (acute respiratory failure) ([place]) 02/10/2016  . CAD (coronary artery disease) 02/10/2016   a. 03/14/2016 Staged PCI with DES to mid RCA. b. NSTEMI 02/2016: DES to mid-LAD, POBA to 1st Diag, 85% stenosis of RCA noted with staged PCI recommended  . Chronic combined systolic (congestive) and diastolic (congestive) heart failure    a. Echo 02/2016: EF 40-45% w/ Grade 2 DD. 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  . Flash pulmonary edema ([place]) 02/10/2016   requiring intubation  . Hypertension   . Meralgia paresthetica of right side 12/18/2013  . Myocardial infarction 02/2016  . NSTEMI (non-ST elevated myocardial infarction) ([place])   . Obesity (BMI 30-39.9) 04/03/2016  . Orthostatic hypotension 04/27/2014  . OSA on CPAP   . Required emergent intubation 02/10/2016  . Type II diabetes mellitus [facility])     Patient Active Problem List   Diagnosis Date Noted  . Depressed mood 06/28/2016  . Depression 06/28/2016  . Abnormal bruising 06/18/2016  . Leukocytosis 05/21/2016  . Anemia 05/18/2016  . Leg swelling 05/18/2016  .  Preventative health care 04/19/2016  . Diabetic peripheral neuropathy ([place]) 04/19/2016  . Obesity (BMI 30-39.9) 04/03/2016  . Cough 03/28/2016  . Dyspnea 03/09/2016  . Dysphagia 03/09/2016  . Constipation 03/07/2016  . Fall 03/01/2016  . OSA (obstructive sleep apnea)   . Mitral valve mass   . CAD, multiple vessel 02/20/2016  . Stridor 02/19/2016  . Hypertensive heart disease with heart failure ([place])   . GERD (gastroesophageal reflux disease) 12/03/2013  . Chronic combined systolic and diastolic congestive heart failure ([place]) 11/16/2013  . Degenerative disk disease 08/12/2012  . Hyperlipidemia with target LDL less than 100 08/11/2012  . Essential hypertension 07/26/2012  . Diabetes mellitus, type 2 ([place]) 07/26/2012  . Morbid (severe) obesity due to excess calories ([place]) 07/26/2012    Past Surgical History:  Procedure Laterality Date  . ANTERIOR CERVICAL DECOMP/DISCECTOMY FUSION  08/28/2012   Procedure: ANTERIOR CERVICAL DECOMPRESSION/DISCECTOMY FUSION 2 LEVELS;  Surgeon: Otilio Connors, MD;  Location: [facility];  Service: Neurosurgery;  Laterality: N/A;  Cervical four-five, cervical six-seven Anterior cervical decompression/diskectomy/fusion/LifeNet Bone/Trestle plate  . BACK SURGERY    . BREAST CYST EXCISION Right   . CARDIAC CATHETERIZATION N/A 02/15/2016   Procedure: Left Heart Cath and Coronary Angiography;  Surgeon: Wellington Hampshire, MD;  Location: [place] [facility];  Service: Cardiovascular;  Laterality: N/A;  . CARDIAC CATHETERIZATION N/A 02/15/2016   Procedure: Coronary Stent Intervention;  Surgeon: Wellington Hampshire, MD;  Location: [place] [facility];  Service:  Cardiovascular;  Laterality: N/A;  . CARDIAC CATHETERIZATION N/A 02/15/2016   Procedure: Coronary Balloon Angioplasty;  Surgeon: Wellington Hampshire, MD;  Location: [place] [facility];  Service: Cardiovascular;  Laterality: N/A;  . CARDIAC CATHETERIZATION N/A 03/14/2016   Procedure: Coronary Stent Intervention;  Surgeon:  Wellington Hampshire, MD;  Location: [place] [facility];  Service: Cardiovascular;  Laterality: N/A;  . CORONARY ANGIOPLASTY WITH STENT PLACEMENT    . SPLIT NIGHT STUDY  04/07/2016    OB History    No data available       Home Medications    Prior to Admission medications   Medication Sig Start Date End Date Taking? Authorizing Provider  acetaminophen (TYLENOL) 325 MG tablet Take 2 tablets (650 mg total) by mouth every 6 (six) hours as needed for mild pain (or Fever >/= 101). 11/19/13   Olam Idler, MD  albuterol (PROVENTIL HFA;VENTOLIN HFA) 108 (90 Base) MCG/ACT inhaler Inhale 2 puffs into the lungs every 6 (six) hours as needed for wheezing or shortness of breath. 02/25/16   Shanker Kristeen Mans, MD  AMBULATORY NON FORMULARY MEDICATION Take 90 mg by mouth 2 (two) times daily. Medication Name: BRILINTA 90 mg BID (TWILIGHT Research study provided) 03/15/16   Burnell Blanks, MD  AMBULATORY NON FORMULARY MEDICATION Take 81 mg by mouth daily. Medication Name: ASPIRIN 81 mg daily or PLACEBO (TWILIGHT Research study) 06/15/16   Burnell Blanks, MD  amLODipine (NORVASC) 10 MG tablet TAKE ONE TABLET BY MOUTH ONCE DAILY 06/18/16   Lupita Dawn, MD  atorvastatin (LIPITOR) 40 MG tablet Take 40 mg by mouth daily. 06/05/16   Historical Provider, MD  blood glucose meter kit and supplies KIT Dispense based on patient and insurance preference. Use up to four times daily as directed. (FOR ICD-9 250.00, 250.01). 02/17/16   Theodis Blaze, MD  carvedilol (COREG) 12.5 MG tablet TAKE ONE TABLET BY MOUTH TWICE DAILY WITH MEALS 09/24/16   Lupita Dawn, MD  dextromethorphan-guaiFENesin [facility] DM) 30-600 MG 12hr tablet Take 1 tablet by mouth 2 (two) times daily. 06/18/16   Marjie Skiff, MD  docusate sodium (COLACE) 100 MG capsule Take 1 capsule (100 mg total) by mouth every 12 (twelve) hours. 03/10/16   Comer Locket, PA-C  DULoxetine (CYMBALTA) 30 MG capsule Take 1 capsule (30 mg total) by mouth daily.  06/28/16   Lupita Dawn, MD  Elastic Bandages & Supports (MEDICAL COMPRESSION SOCKS) MISC 2 layer medical compression socks - 30-43mHg - Knee High - VIVE WEAR BRAND 09/13/16   MGerda Diss DO  ferrous sulfate 325 (65 FE) MG tablet Take 1 tablet (325 mg total) by mouth daily with breakfast. 05/21/16   KLupita Dawn MD  hydrochlorothiazide (MICROZIDE) 12.5 MG capsule TAKE ONE CAPSULE BY MOUTH ONCE DAILY 09/17/16   KLupita Dawn MD  Insulin Glargine (LANTUS SOLOSTAR) 100 UNIT/ML Solostar Pen Inject 30 Units into the skin daily at 10 pm. 08/27/16   KLupita Dawn MD  metFORMIN (GLUCOPHAGE) 1000 MG tablet TAKE ONE TABLET BY MOUTH TWICE DAILY WITH A MEAL 08/20/16   KLupita Dawn MD  naproxen (NAPROSYN) 375 MG tablet Take 1 tablet (375 mg total) by mouth 2 (two) times daily as needed for moderate pain. 09/30/16 10/07/16  CDuffy Bruce MD  polyethylene glycol ([facility]/ GFloria Raveling packet Take 17 g by mouth as needed for mild constipation or moderate constipation.     Historical Provider, MD  senna-docusate (SENOKOT-S) 8.6-50 MG tablet Take  2 tablets by mouth daily. As needed for constipation. Patient taking differently: Take 2 tablets by mouth daily as needed. As needed for constipation. 03/07/16   Lupita Dawn, MD    Family History Family History  Problem Relation Age of Onset  . Heart disease Father 66    MI  . Heart disease Paternal Grandmother 73  . Diabetes Paternal Grandmother   . Hypertension Paternal Grandmother   . Heart disease Paternal Aunt   . Cancer Neg Hx   . Stroke Neg Hx   . Alcohol abuse Neg Hx     Social History Social History  Substance Use Topics  . Smoking status: Never Smoker  . Smokeless tobacco: Never Used  . Alcohol use Yes     Comment: 03/14/2016 "I'll have a couple drinks on major holidays"     Allergies   Ace inhibitors   Review of Systems Review of Systems  Constitutional: Negative for chills, fatigue and fever.  HENT: Positive for facial swelling.  Negative for congestion and rhinorrhea.   Eyes: Negative for visual disturbance.  Respiratory: Negative for cough, shortness of breath and wheezing.   Cardiovascular: Negative for chest pain and leg swelling.  Gastrointestinal: Negative for abdominal pain, diarrhea, nausea and vomiting.  Genitourinary: Negative for dysuria and flank pain.  Musculoskeletal: Positive for arthralgias. Negative for neck pain and neck stiffness.  Skin: Negative for rash and wound.  Allergic/Immunologic: Negative for immunocompromised state.  Neurological: Negative for syncope, weakness and headaches.  All other systems reviewed and are negative.    Physical Exam Updated Vital Signs BP 143/66 (BP Location: Right Arm)   Pulse 63   Temp 97.6 F (36.4 C) (Oral)   Resp 16   LMP 08/21/2016 Comment: verified before imaging = irregular periods  SpO2 100%   Physical Exam  Constitutional: She is oriented to person, place, and time. She appears well-developed and well-nourished. No distress.  HENT:  Head: Normocephalic and atraumatic.  Moderate submandibular ecchymosis with TTP over inferior aspect of mandible. No open wounds. No oral or lip swelling or lesions. No facial asymmetry. Poor dentition without acute fx or oral or tongue lesions.  Eyes: Conjunctivae are normal.  Neck: Neck supple.  Mild right paraspinal TTP. No bony deformities.  Cardiovascular: Normal rate, regular rhythm and normal heart sounds.  Exam reveals no friction rub.   No murmur heard. Pulmonary/Chest: Effort normal and breath sounds normal. No respiratory distress. She has no wheezes. She has no rales.  Abdominal: She exhibits no distension.  Musculoskeletal: She exhibits no edema.  Neurological: She is alert and oriented to person, place, and time. She exhibits normal muscle tone.  Skin: Skin is warm. Capillary refill takes less than 2 seconds.  Psychiatric: She has a normal mood and affect.  Nursing note and vitals  reviewed.   LOWER EXTREMITY EXAM: RIGHT  INSPECTION & PALPATION: Moderate swelling over her anterior right knee with associated ecchymosis. No ligamentous instability. Range of motion is full and painless.  SENSORY: sensation is intact to light touch in:  Superficial peroneal nerve distribution (over dorsum of foot) Deep peroneal nerve distribution (over first dorsal web space) Sural nerve distribution (over lateral aspect 5th metatarsal) Saphenous nerve distribution (over medial instep)  MOTOR:  + Motor EHL (great toe dorsiflexion) + FHL (great toe plantar flexion)  + TA (ankle dorsiflexion)  + GSC (ankle plantar flexion)  VASCULAR: 2+ dorsalis pedis and posterior tibialis pulses Capillary refill < 2 sec, toes warm and well-perfused  COMPARTMENTS:  Soft, warm, well-perfused No pain with passive extension No parethesias     ED Treatments / Results  Labs (all labs ordered are listed, but only abnormal results are displayed) Labs Reviewed - No data to display  EKG  EKG Interpretation None       Radiology Ct Head Wo Contrast  Result Date: 09/30/2016 CLINICAL DATA:  Facial injury after fall last night. No loss of consciousness. EXAM: CT HEAD WITHOUT CONTRAST CT MAXILLOFACIAL WITHOUT CONTRAST CT CERVICAL SPINE WITHOUT CONTRAST TECHNIQUE: Multidetector CT imaging of the head, cervical spine, and maxillofacial structures were performed using the standard protocol without intravenous contrast. Multiplanar CT image reconstructions of the cervical spine and maxillofacial structures were also generated. COMPARISON:  CT scan of July 26, 2012. FINDINGS: CT HEAD FINDINGS Brain: No mass effect or midline shift is noted. Ventricular size is within normal limits. There is no evidence of mass lesion, hemorrhage or acute infarction. Vascular: No hyperdense vessel or unexpected calcification. Skull: Normal. Negative for fracture or focal lesion. Other: None. CT MAXILLOFACIAL FINDINGS  Osseous: No fracture or mandibular dislocation. No destructive process. Orbits: Negative. No traumatic or inflammatory finding. Sinuses: Clear. Soft tissues: Negative. CT CERVICAL SPINE FINDINGS Alignment: Normal. Skull base and vertebrae: Status post surgical anterior fusion of C4-5 and C6-7. No fracture is noted. Posterior facet joints are unremarkable. Soft tissues and spinal canal: No prevertebral fluid or swelling. No visible canal hematoma. Disc levels: Postsurgical changes as described above. Moderate degenerative disc disease is noted at C7-T1. Upper chest: Negative. Other: None. IMPRESSION: Normal head CT. No abnormality seen in maxillofacial region. Postsurgical and degenerative changes seen in the cervical spine. No acute abnormality seen. Electronically Signed   By: Marijo Conception, M.D.   On: 09/30/2016 17:29   Ct Cervical Spine Wo Contrast  Result Date: 09/30/2016 CLINICAL DATA:  Facial injury after fall last night. No loss of consciousness. EXAM: CT HEAD WITHOUT CONTRAST CT MAXILLOFACIAL WITHOUT CONTRAST CT CERVICAL SPINE WITHOUT CONTRAST TECHNIQUE: Multidetector CT imaging of the head, cervical spine, and maxillofacial structures were performed using the standard protocol without intravenous contrast. Multiplanar CT image reconstructions of the cervical spine and maxillofacial structures were also generated. COMPARISON:  CT scan of July 26, 2012. FINDINGS: CT HEAD FINDINGS Brain: No mass effect or midline shift is noted. Ventricular size is within normal limits. There is no evidence of mass lesion, hemorrhage or acute infarction. Vascular: No hyperdense vessel or unexpected calcification. Skull: Normal. Negative for fracture or focal lesion. Other: None. CT MAXILLOFACIAL FINDINGS Osseous: No fracture or mandibular dislocation. No destructive process. Orbits: Negative. No traumatic or inflammatory finding. Sinuses: Clear. Soft tissues: Negative. CT CERVICAL SPINE FINDINGS Alignment: Normal.  Skull base and vertebrae: Status post surgical anterior fusion of C4-5 and C6-7. No fracture is noted. Posterior facet joints are unremarkable. Soft tissues and spinal canal: No prevertebral fluid or swelling. No visible canal hematoma. Disc levels: Postsurgical changes as described above. Moderate degenerative disc disease is noted at C7-T1. Upper chest: Negative. Other: None. IMPRESSION: Normal head CT. No abnormality seen in maxillofacial region. Postsurgical and degenerative changes seen in the cervical spine. No acute abnormality seen. Electronically Signed   By: Marijo Conception, M.D.   On: 09/30/2016 17:29   Dg Knee Complete 4 Views Right  Result Date: 09/30/2016 CLINICAL DATA:  Fall in bathtub today landed on right knee. Anterior lateral pain and hematoma. EXAM: RIGHT KNEE - COMPLETE 4+ VIEW COMPARISON:  06/07/2016 FINDINGS: Arterial vascular calcifications. Subcutaneous edema anterior to  the patella. Chronic mild irregularity of the tibial tubercle, not appreciably changed. Upper normal amount of fluid in the knee joint. IMPRESSION: 1. Prepatellar subcutaneous edema but no acute bony findings. No significant knee effusion. 2. Atherosclerotic vascular disease. Electronically Signed   By: Van Clines M.D.   On: 09/30/2016 16:44   Ct Maxillofacial Wo Contrast  Result Date: 09/30/2016 CLINICAL DATA:  Facial injury after fall last night. No loss of consciousness. EXAM: CT HEAD WITHOUT CONTRAST CT MAXILLOFACIAL WITHOUT CONTRAST CT CERVICAL SPINE WITHOUT CONTRAST TECHNIQUE: Multidetector CT imaging of the head, cervical spine, and maxillofacial structures were performed using the standard protocol without intravenous contrast. Multiplanar CT image reconstructions of the cervical spine and maxillofacial structures were also generated. COMPARISON:  CT scan of July 26, 2012. FINDINGS: CT HEAD FINDINGS Brain: No mass effect or midline shift is noted. Ventricular size is within normal limits. There  is no evidence of mass lesion, hemorrhage or acute infarction. Vascular: No hyperdense vessel or unexpected calcification. Skull: Normal. Negative for fracture or focal lesion. Other: None. CT MAXILLOFACIAL FINDINGS Osseous: No fracture or mandibular dislocation. No destructive process. Orbits: Negative. No traumatic or inflammatory finding. Sinuses: Clear. Soft tissues: Negative. CT CERVICAL SPINE FINDINGS Alignment: Normal. Skull base and vertebrae: Status post surgical anterior fusion of C4-5 and C6-7. No fracture is noted. Posterior facet joints are unremarkable. Soft tissues and spinal canal: No prevertebral fluid or swelling. No visible canal hematoma. Disc levels: Postsurgical changes as described above. Moderate degenerative disc disease is noted at C7-T1. Upper chest: Negative. Other: None. IMPRESSION: Normal head CT. No abnormality seen in maxillofacial region. Postsurgical and degenerative changes seen in the cervical spine. No acute abnormality seen. Electronically Signed   By: Marijo Conception, M.D.   On: 09/30/2016 17:29    Procedures Procedures (including critical care time)  Medications Ordered in ED Medications - No data to display   Initial Impression / Assessment and Plan / ED Course  I have reviewed the triage vital signs and the nursing notes.  Pertinent labs & imaging results that were available during my care of the patient were reviewed by me and considered in my medical decision making (see chart for details).  Clinical Course     50 year old female who presents with facial pain and right knee pain after mechanical fall in the shower. No loss of consciousness and patient is not on blood thinners. Examination is as above. No oral trauma. CT of the head, neck, and face are unremarkable. No acute abnormality. Plain film of the knee is negative as well patient is ambulatory. Will discharge with supportive care and outpatient follow-up.  Final Clinical Impressions(s) / ED  Diagnoses   Final diagnoses:  Contusion of face, initial encounter  Contusion of right knee, initial encounter  Fall in (into) shower or empty bathtub, initial encounter    New Prescriptions Discharge Medication List as of 09/30/2016  5:54 PM    START taking these medications   Details  naproxen (NAPROSYN) 375 MG tablet Take 1 tablet (375 mg total) by mouth 2 (two) times daily as needed for moderate pain., Starting Sun 09/30/2016, Until Sun 10/07/2016, Print         Duffy Bruce, MD 09/30/16 1843

## 2016-09-30 NOTE — ED Triage Notes (Signed)
Patient slipped and fell in shower 1 hour pta. Fell hitting chin on lip of tub, bruising noted, no laceration. Also has right knee pain with swelling, denies loc.

## 2016-10-01 ENCOUNTER — Encounter: Payer: Self-pay | Admitting: *Deleted

## 2016-10-01 DIAGNOSIS — Z006 Encounter for examination for normal comparison and control in clinical research program: Secondary | ICD-10-CM

## 2016-10-01 NOTE — Progress Notes (Signed)
Patient called Research office stating she is out of Brilinta. I informed her that I dispensed her two bottles enough to last until 06/Mar/2018. She states she cant find another bottle. Patient instructed to come to research office and bottle # [id number] dispensed to patient.

## 2016-11-09 ENCOUNTER — Ambulatory Visit: Payer: BLUE CROSS/BLUE SHIELD | Admitting: Internal Medicine

## 2016-11-21 ENCOUNTER — Other Ambulatory Visit: Payer: Self-pay | Admitting: Family Medicine

## 2016-12-26 ENCOUNTER — Encounter: Payer: Self-pay | Admitting: *Deleted

## 2016-12-26 DIAGNOSIS — Z006 Encounter for examination for normal comparison and control in clinical research program: Secondary | ICD-10-CM

## 2016-12-26 NOTE — Progress Notes (Signed)
TWILIGHT research study month 9 follow up visit completed. Patient denies any bleeding issues or other adverse events. She states she has been compliant with study required medication. After pill count she has been 97.4 % compliant with ASA/Placebo and 97.6 % compliant with Brilinta. She returns bottle # T [id number] sealed because she had misplaced and I dispensed her a new bottle in November. Today she was dispensed the following bottle numbers: [id number]; [phone]. Her next research required visit is due no later than 2/SEP/2018. At that appointment she will no longer receive Brilinta or ASA/Placebo. Further antiplatelet therapy will be at the discretion of her cardiologist. Questions encouraged and answered.

## 2016-12-29 ENCOUNTER — Other Ambulatory Visit: Payer: Self-pay | Admitting: Family Medicine

## 2017-01-15 ENCOUNTER — Other Ambulatory Visit: Payer: Self-pay | Admitting: Family Medicine

## 2017-01-15 DIAGNOSIS — I10 Essential (primary) hypertension: Secondary | ICD-10-CM

## 2017-01-22 ENCOUNTER — Ambulatory Visit (INDEPENDENT_AMBULATORY_CARE_PROVIDER_SITE_OTHER): Payer: BLUE CROSS/BLUE SHIELD | Admitting: Cardiovascular Disease

## 2017-01-22 ENCOUNTER — Encounter: Payer: Self-pay | Admitting: Cardiovascular Disease

## 2017-01-22 VITALS — BP 130/77 | HR 67 | Ht 62.5 in | Wt 226.0 lb

## 2017-01-22 DIAGNOSIS — I5022 Chronic systolic (congestive) heart failure: Secondary | ICD-10-CM

## 2017-01-22 DIAGNOSIS — E785 Hyperlipidemia, unspecified: Secondary | ICD-10-CM | POA: Diagnosis not present

## 2017-01-22 DIAGNOSIS — I2510 Atherosclerotic heart disease of native coronary artery without angina pectoris: Secondary | ICD-10-CM

## 2017-01-22 NOTE — Progress Notes (Signed)
Cardiology Office Note   Date:  01/22/2017   ID:  [name], [name] 06-18-1966, MRN [id number]  PCP:  Lupita Dawn, MD  Cardiologist:   [name], MD   Chief Complaint  Patient presents with  . Follow-up      History of Present Illness: [name] is a 51 y.o. female who presents for a follow-up visit regarding coronary artery disease and chronic systolic heart failure. She has known history of hypertension, type 2 diabetes and obesity. She was hospitalized in April, 2017 with respiratory failure due to acute systolic heart failure with non-ST elevation myocardial infarction.  Echocardiogram showed an ejection fraction of 40-45% with apical and inferoapical akinesis. There was a questionable mass on the mitral valve which was ruled out by TEE. She underwent cardiac catheterization during her hospitalization which showed severe two-vessel coronary artery disease with 99% mid to distal LAD stenosis, 99% ostial first diagonal stenosis and 85% mid RCA stenosis. She underwent successful balloon angioplasty of the first diagonal and drug-eluting stent placement to the mid to distal LAD.   She was subsequently discharged home but returned with respiratory distress and stridor which was found to be due to subglottic edema. She was reintubated. This was felt to be due to her recent intubation and possibly due to ACE inhibitor use. She was discharged home on prednisone. She had staged RCA PCI and drug-eluting stent placement in May without complications.  She is enrolled in the twilight research study. She has been doing well and denies any chest pain or shortness of breath. She is very compliant with her medications.  Past Medical History:  Diagnosis Date  . ARF (acute respiratory failure) ([place]) 02/10/2016  . CAD (coronary artery disease) 02/10/2016   a. 03/14/2016 Staged PCI with DES to mid RCA. b. NSTEMI 02/2016: DES to mid-LAD, POBA to 1st Diag, 85% stenosis of RCA noted with  staged PCI recommended  . Chronic combined systolic (congestive) and diastolic (congestive) heart failure    a. Echo 02/2016: EF 40-45% w/ Grade 2 DD. 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  . Flash pulmonary edema ([place]) 02/10/2016   requiring intubation  . Hypertension   . Meralgia paresthetica of right side 12/18/2013  . Myocardial infarction 02/2016  . NSTEMI (non-ST elevated myocardial infarction) ([place])   . Obesity (BMI 30-39.9) 04/03/2016  . Orthostatic hypotension 04/27/2014  . OSA on CPAP   . Required emergent intubation 02/10/2016  . Type II diabetes mellitus ([place])     Past Surgical History:  Procedure Laterality Date  . ANTERIOR CERVICAL DECOMP/DISCECTOMY FUSION  08/28/2012   Procedure: ANTERIOR CERVICAL DECOMPRESSION/DISCECTOMY FUSION 2 LEVELS;  Surgeon: Otilio Connors, MD;  Location: [facility];  Service: Neurosurgery;  Laterality: N/A;  Cervical four-five, cervical six-seven Anterior cervical decompression/diskectomy/fusion/LifeNet Bone/Trestle plate  . BACK SURGERY    . BREAST CYST EXCISION Right   . CARDIAC CATHETERIZATION N/A 02/15/2016   Procedure: Left Heart Cath and Coronary Angiography;  Surgeon: Wellington Hampshire, MD;  Location: [facility];  Service: Cardiovascular;  Laterality: N/A;  . CARDIAC CATHETERIZATION N/A 02/15/2016   Procedure: Coronary Stent Intervention;  Surgeon: Wellington Hampshire, MD;  Location: [facility];  Service: Cardiovascular;  Laterality: N/A;  . CARDIAC CATHETERIZATION N/A 02/15/2016   Procedure: Coronary Balloon Angioplasty;  Surgeon: Wellington Hampshire, MD;  Location: [facility];  Service: Cardiovascular;  Laterality: N/A;  . CARDIAC CATHETERIZATION N/A 03/14/2016  Procedure: Coronary Stent Intervention;  Surgeon: Wellington Hampshire, MD;  Location: [facility];  Service: Cardiovascular;  Laterality: N/A;  . CORONARY ANGIOPLASTY WITH STENT PLACEMENT    . SPLIT NIGHT STUDY  04/07/2016     Current  Outpatient Prescriptions  Medication Sig Dispense Refill  . acetaminophen (TYLENOL) 325 MG tablet Take 2 tablets (650 mg total) by mouth every 6 (six) hours as needed for mild pain (or Fever >/= 101). 60 tablet 0  . albuterol (PROVENTIL HFA;VENTOLIN HFA) 108 (90 Base) MCG/ACT inhaler Inhale 2 puffs into the lungs every 6 (six) hours as needed for wheezing or shortness of breath. 1 Inhaler 0  . AMBULATORY NON FORMULARY MEDICATION Take 90 mg by mouth 2 (two) times daily. Medication Name: BRILINTA 90 mg BID (TWILIGHT Research study provided)    . AMBULATORY NON FORMULARY MEDICATION Take 81 mg by mouth daily. Medication Name: ASPIRIN 81 mg daily or PLACEBO (TWILIGHT Research study)    . amLODipine (NORVASC) 10 MG tablet TAKE ONE TABLET BY MOUTH ONCE DAILY 90 tablet 1  . atorvastatin (LIPITOR) 40 MG tablet Take 40 mg by mouth daily.    Marland Kitchen atorvastatin (LIPITOR) 40 MG tablet TAKE ONE TABLET BY MOUTH ONCE DAILY AT  6PM 90 tablet 1  . blood glucose meter kit and supplies KIT Dispense based on patient and insurance preference. Use up to four times daily as directed. (FOR ICD-9 250.00, 250.01). 1 each 0  . carvedilol (COREG) 12.5 MG tablet TAKE ONE TABLET BY MOUTH TWICE DAILY WITH MEALS 60 tablet 2  . dextromethorphan-guaiFENesin (MUCINEX DM) 30-600 MG 12hr tablet Take 1 tablet by mouth 2 (two) times daily. 60 tablet 1  . docusate sodium (COLACE) 100 MG capsule Take 1 capsule (100 mg total) by mouth every 12 (twelve) hours. 30 capsule 0  . DULoxetine (CYMBALTA) 30 MG capsule Take 1 capsule (30 mg total) by mouth daily. 30 capsule 2  . Elastic Bandages & Supports (MEDICAL COMPRESSION SOCKS) MISC 2 layer medical compression socks - 30-90mHg - Knee High - VIVE WEAR BRAND 1 each prn  . ferrous sulfate 325 (65 FE) MG tablet Take 1 tablet (325 mg total) by mouth daily with breakfast. 90 tablet 1  . hydrochlorothiazide (MICROZIDE) 12.5 MG capsule TAKE ONE CAPSULE BY MOUTH ONCE DAILY 30 capsule 2  . Insulin Glargine  (LANTUS SOLOSTAR) 100 UNIT/ML Solostar Pen Inject 30 Units into the skin daily at 10 pm. 5 pen 3  . metFORMIN (GLUCOPHAGE) 1000 MG tablet TAKE ONE TABLET BY MOUTH TWICE DAILY WITH MEALS 60 tablet 3  . polyethylene glycol (MIRALAX / GLYCOLAX) packet Take 17 g by mouth as needed for mild constipation or moderate constipation.     . senna-docusate (SENOKOT-S) 8.6-50 MG tablet Take 2 tablets by mouth daily. As needed for constipation. (Patient taking differently: Take 2 tablets by mouth daily as needed. As needed for constipation.) 60 tablet 1   No current facility-administered medications for this visit.     Allergies:   Ace inhibitors    Social History:  The patient  reports that she has never smoked. She has never used smokeless tobacco. She reports that she drinks alcohol. She reports that she uses drugs, including Marijuana.   Family History:  The patient's family history includes Diabetes in her paternal grandmother; Heart disease in her paternal aunt; Heart disease (age of onset: 511 in her paternal grandmother; Heart disease (age of onset: 519 in her father; Hypertension in her paternal grandmother.  ROS:  Please see the history of present illness.   Otherwise, review of systems are positive for none.   All other systems are reviewed and negative.    PHYSICAL EXAM: VS:  BP 130/77   Pulse 67   Ht 5' 2.5" (1.588 m)   Wt 226 lb (102.5 kg)   BMI 40.68 kg/m  , BMI Body mass index is 40.68 kg/m. GEN: Well nourished, well developed, in no acute distress  HEENT: normal  Neck: no JVD, carotid bruits, or masses Cardiac: RRR; no  rubs, or gallops,no edema . One out of 6 systolic ejection murmur in the aortic area. Respiratory:  clear to auscultation bilaterally, normal work of breathing GI: soft, nontender, nondistended, + BS MS: no deformity or atrophy  Skin: warm and dry, no rash Neuro:  Strength and sensation are intact Psych: euthymic mood, full affect   EKG:  EKG is not ordered  today.    Recent Labs: 02/25/2016: Magnesium 2.1 03/22/2016: Brain Natriuretic Peptide 64.0; BUN 9; Creat 0.89; Potassium 4.1; Sodium 139 04/19/2016: TSH 1.66 05/15/2016: ALT 16 06/18/2016: Hemoglobin 10.6; Platelets 285    Lipid Panel    Component Value Date/Time   CHOL 117 (L) 05/15/2016 0952   TRIG 250 (H) 05/15/2016 0952   HDL 32 (L) 05/15/2016 0952   CHOLHDL 3.7 05/15/2016 0952   VLDL 50 (H) 05/15/2016 0952   LDLCALC 35 05/15/2016 0952      Wt Readings from Last 3 Encounters:  01/22/17 226 lb (102.5 kg)  08/20/16 234 lb 12.8 oz (106.5 kg)  07/17/16 226 lb (102.5 kg)       ASSESSMENT AND PLAN:  1.  Coronary artery disease involving native coronary arteries without angina:   She is doing well with no anginal symptoms. Continue medical therapy. After she is done with twilight research study, I am planning to keep her on aspirin 81 mg once daily.  2. Chronic systolic heart failure: Ejection fraction was 40-45%. She appears to be euvolemic without diuretics.   3. Obstructive sleep apnea: Continue CPAP.  4. Hyperlipidemia:  Continue treatment with atorvastatin with a target LDL of less than 70. Most recent LDL in July was 85.  Disposition:   FU with me in 6 months  Signed,  [name], MD  01/22/2017 8:54 AM    [place]

## 2017-01-22 NOTE — Patient Instructions (Signed)

## 2017-01-30 ENCOUNTER — Other Ambulatory Visit: Payer: Self-pay | Admitting: *Deleted

## 2017-01-30 DIAGNOSIS — E1159 Type 2 diabetes mellitus with other circulatory complications: Secondary | ICD-10-CM

## 2017-01-30 MED ORDER — INSULIN GLARGINE 100 UNIT/ML SOLOSTAR PEN: 30.0000 [IU] | PEN_INJECTOR | Freq: Every day | SUBCUTANEOUS | 3 refills | Status: DC

## 2017-02-15 ENCOUNTER — Telehealth: Payer: Self-pay | Admitting: Family Medicine

## 2017-02-15 ENCOUNTER — Other Ambulatory Visit: Payer: Self-pay | Admitting: Family Medicine

## 2017-02-15 MED ORDER — INSULIN GLARGINE 100 UNIT/ML SOLOSTAR PEN: 30.0000 [IU] | PEN_INJECTOR | Freq: Every day | SUBCUTANEOUS | 3 refills | Status: DC

## 2017-02-15 MED ORDER — "INSULIN SYRINGE 29G X 1/2"" 1 ML MISC": 1 refills | Status: DC

## 2017-02-15 MED ORDER — INSULIN GLARGINE 100 UNIT/ML ~~LOC~~ SOLN: 30.0000 [IU] | Freq: Every day | SUBCUTANEOUS | 1 refills | Status: DC

## 2017-02-15 NOTE — Telephone Encounter (Signed)
Pt called and would like to speak to Dr. Randolm Idol about a refill she needs desperately. I asked her which one and she said that she would let him. [name]

## 2017-02-15 NOTE — Telephone Encounter (Signed)
Attempted to call patient, no answer, left message that I would call her pharmacy.

## 2017-02-15 NOTE — Telephone Encounter (Signed)
Pt states she doesn't have much insulin left and would like to get this called in ASAP. ep

## 2017-02-15 NOTE — Telephone Encounter (Signed)
Called pharmacy and confirmed that patient would like to switch to insulin vials. Sent in prescription.

## 2017-02-15 NOTE — Telephone Encounter (Signed)
Received faxed request from [facility] [phone]). Patient is requesting lantus vial instead of solostar pen. Kinnie Feil, RN, BSN

## 2017-02-15 NOTE — Addendum Note (Signed)
Addended by: Uvaldo Rising on: 02/15/2017 03:37 PM   Modules accepted: Orders

## 2017-02-15 NOTE — Telephone Encounter (Signed)
[facility] is calling and would like to speak to the doctor about changing the patients solostar to the VIALS since this wood be cheaper and the patient is almost out. [name]

## 2017-02-25 NOTE — Progress Notes (Signed)
   Subjective:    Patient ID: [name], female    DOB: 02/11/1966, 51 y.o.   MRN: [id number]  HPI 51 y/o female presents for routine follow up.   Diabetes/Obesity Prescribed Lantus 30 units daily and Metformin 1000 mg BID (had been taking both tablets at same time and had stomach upset, better with spreading out), reports compliance, fasting blood sugars running high 200's, does not check other times of day,   Exercise - walks 30 minutes per day, limited by pain Diet - "terrible", does not feel like eating, occurred after previous MI, eats only one meal per day (usually caserole of meat/vegetables and salad), does eat one snack per day (crackers); goal to eat more vegetables  Hypertension  Prescribed Novasc 10 mg daily, Coreg 12.5 mg BID, and HCTZ 12.5 mg daily, No current ACE-I/ARB due to history of allergic reaction. No lightheadedness, no chest pain.   Foot Pain Due to nerve pain per patient, needles/sharp pains in feet, attempted gabapentin (did not work), Lyrica too expensive but worked    Iron Deficiency Anemia  No longer taking iron, no bleeding in stools   HM Due for pap smear, diabetic eye exam, and hemoglobin A1C  Social Nonsmoker  Review of Systems See above    Objective:   Physical Exam BP (!) 142/80   Pulse 60   Temp 97.5 F (36.4 C) (Oral)   Ht 5' 2.5" (1.588 m)   Wt 216 lb 6.4 oz (98.2 kg)   SpO2 98%   BMI 38.95 kg/m   Gen: pleasant female, NAD, obese HEENT: normocephalic, PERRL, EOMI, no scleral icterus, MMM, uvula midline, neck supple, no adenopathy Cardiac: RRR, S1 and S2 present, no murmur Resp: CTAB, normal effort Ext: trace edema, Foot exam - 2+ DP pulses, no skin breakdown, diminished sensation to monofilament testing  A1C 8.1 (up from 7.5 at last visit).      Assessment & Plan:  Essential hypertension Right at border of 140/80. No changes in medications today. -encouraged healthy eating habits, weight loss, and regular  exercise  Diabetic peripheral neuropathy (HCC) Uncontrolled. No current therapy. Previously unable to afford Lyrica. -restart Gabapentin 300 mg TID, titrate as able to   Diabetes mellitus, type 2 (HCC) Uncontrolled. A1C 8.1. No changes in medications today as patient wishes to pursue continue lifestyle changes. -continue lantus 30 units daily and Metformin 1000 mg BID -discussed healthy eating habits and regular exercise  Anemia No current bleeding. Not on supplementation. -check cbc today  Morbid (severe) obesity due to excess calories (HCC) 20 pound weight loss over past 6 months. Congratulated patient. -patient to continue to work on losing weight.   Patient told to make separate appointment for pap smear/well woman visit.

## 2017-02-28 ENCOUNTER — Ambulatory Visit (INDEPENDENT_AMBULATORY_CARE_PROVIDER_SITE_OTHER): Payer: BLUE CROSS/BLUE SHIELD | Admitting: Family Medicine

## 2017-02-28 ENCOUNTER — Encounter: Payer: Self-pay | Admitting: Family Medicine

## 2017-02-28 VITALS — BP 142/80 | HR 60 | Temp 97.5°F | Ht 62.5 in | Wt 216.4 lb

## 2017-02-28 DIAGNOSIS — I10 Essential (primary) hypertension: Secondary | ICD-10-CM | POA: Diagnosis not present

## 2017-02-28 DIAGNOSIS — D649 Anemia, unspecified: Secondary | ICD-10-CM

## 2017-02-28 DIAGNOSIS — E1159 Type 2 diabetes mellitus with other circulatory complications: Secondary | ICD-10-CM

## 2017-02-28 DIAGNOSIS — E1142 Type 2 diabetes mellitus with diabetic polyneuropathy: Secondary | ICD-10-CM

## 2017-02-28 LAB — POCT GLYCOSYLATED HEMOGLOBIN (HGB A1C): HEMOGLOBIN A1C: 8.1

## 2017-02-28 MED ORDER — GABAPENTIN 300 MG PO CAPS: 300.0000 mg | ORAL_CAPSULE | Freq: Three times a day (TID) | ORAL | 2 refills | Status: DC

## 2017-02-28 NOTE — Assessment & Plan Note (Signed)
Uncontrolled. A1C 8.1. No changes in medications today as patient wishes to pursue continue lifestyle changes. -continue lantus 30 units daily and Metformin 1000 mg BID -discussed healthy eating habits and regular exercise

## 2017-02-28 NOTE — Patient Instructions (Addendum)
It was nice to see you today.  Foot Pain - please start Gabapentin 300 mg three times per day  Diabetes - A1C up a little today (8.1), no change in regimen today, try to increase vegetable intake, repeat in 3 months  Iron - check today  Blood Pressure - not bad today, no changes today  Return in 3 months to recheck the diabetes.  Call me in 2-4 weeks to see how your feet are doing.

## 2017-02-28 NOTE — Assessment & Plan Note (Signed)
Uncontrolled. No current therapy. Previously unable to afford Lyrica. -restart Gabapentin 300 mg TID, titrate as able to

## 2017-02-28 NOTE — Assessment & Plan Note (Signed)
Right at border of 140/80. No changes in medications today. -encouraged healthy eating habits, weight loss, and regular exercise

## 2017-02-28 NOTE — Assessment & Plan Note (Signed)
No current bleeding. Not on supplementation. -check cbc today

## 2017-02-28 NOTE — Assessment & Plan Note (Signed)
20 pound weight loss over past 6 months. Congratulated patient. -patient to continue to work on losing weight.

## 2017-03-01 LAB — BASIC METABOLIC PANEL
BUN / CREAT RATIO: 17 (ref 9–23)
BUN: 13 mg/dL (ref 6–24)
CO2: 25 mmol/L (ref 18–29)
CREATININE: 0.76 mg/dL (ref 0.57–1.00)
Calcium: 9.7 mg/dL (ref 8.7–10.2)
Chloride: 94 mmol/L — ABNORMAL LOW (ref 96–106)
GFR calc Af Amer: 107 mL/min/{1.73_m2} (ref >59)
GFR calc non Af Amer: 92 mL/min/{1.73_m2} (ref >59)
GLUCOSE: 211 mg/dL — AB (ref 65–99)
POTASSIUM: 3.4 mmol/L — AB (ref 3.5–5.2)
SODIUM: 140 mmol/L (ref 134–144)

## 2017-03-01 LAB — CBC
Hematocrit: 32.5 % — ABNORMAL LOW (ref 34.0–46.6)
Hemoglobin: 9.6 g/dL — ABNORMAL LOW (ref 11.1–15.9)
MCH: 21.3 pg — AB (ref 26.6–33.0)
MCHC: 29.5 g/dL — ABNORMAL LOW (ref 31.5–35.7)
MCV: 72 fL — AB (ref 79–97)
PLATELETS: 247 10*3/uL (ref 150–379)
RBC: 4.5 x10E6/uL (ref 3.77–5.28)
RDW: 17.1 % — AB (ref 12.3–15.4)
WBC: 10.2 10*3/uL (ref 3.4–10.8)

## 2017-03-04 ENCOUNTER — Telehealth: Payer: Self-pay | Admitting: Family Medicine

## 2017-03-04 NOTE — Telephone Encounter (Signed)
Attempted to contact patient with lab results, no answer, left message stating I would call again tomorrow.

## 2017-03-05 NOTE — Telephone Encounter (Signed)
Attempted to contact patient, no answer.

## 2017-03-08 ENCOUNTER — Encounter: Payer: BLUE CROSS/BLUE SHIELD | Admitting: Family Medicine

## 2017-03-08 ENCOUNTER — Other Ambulatory Visit: Payer: Self-pay | Admitting: Family Medicine

## 2017-03-08 DIAGNOSIS — E876 Hypokalemia: Secondary | ICD-10-CM

## 2017-03-08 DIAGNOSIS — D649 Anemia, unspecified: Secondary | ICD-10-CM

## 2017-03-08 HISTORY — DX: Hypokalemia: E87.6

## 2017-03-08 MED ORDER — FERROUS SULFATE 325 (65 FE) MG PO TABS: 325.0000 mg | ORAL_TABLET | Freq: Every day | ORAL | 1 refills | Status: DC

## 2017-03-08 NOTE — Telephone Encounter (Signed)
Spoke to patient about lab results. See Orders Only encounter

## 2017-03-08 NOTE — Progress Notes (Signed)
Called and discussed lab results.  Hemoglobin low - has been out of iron pills, history of iron deficiency, refilled iron tablets, reports monthly periods that last 4-5 days, no spotting between periods, no blood in stools, recheck after back on iron  Low Potassium - intermittently low, states that she will take an occasional lasix pill if her legs swell, patient to eat bananas, recheck at next visit.

## 2017-03-08 NOTE — Assessment & Plan Note (Signed)
intermittently low, states that she will take an occasional lasix pill if her legs swell, patient to eat bananas, recheck at next visit.

## 2017-03-08 NOTE — Assessment & Plan Note (Signed)
Hemoglobin low - has been out of iron pills, history of iron deficiency, refilled iron tablets, reports monthly periods that last 4-5 days, no spotting between periods, no blood in stools, recheck after back on iron, will need colonoscopy when turns 51 later this year.

## 2017-03-08 NOTE — Progress Notes (Deleted)
51 y.o. year old female presents for well woman/preventative visit and annual GYN examination.  Acute Concerns: Diabetic Neuropathy - started on Gabapentin at last visit Anemia - Hemoglobin 9.6 one week ago (history of iron deficiency) Low Potassium - 3.4 one week ago Diet:  Exercise:  Sexual/Birth History:  Birth Control:  POA/Living Will:  Social:  Social History   Social History  . Marital status: Married    Spouse name: N/A  . Number of children: 1  . Years of education: N/A   Social History Main Topics  . Smoking status: Never Smoker  . Smokeless tobacco: Never Used  . Alcohol use Yes     Comment: 03/14/2016 "I'll have a couple drinks on major holidays"  . Drug use: Yes    Types: Marijuana     Comment: 03/14/2016 "last use was in 02/2016"  . Sexual activity: Not Currently    Birth control/ protection: None   Other Topics Concern  . Not on file   Social History Narrative   Lives with husband.  Has one daughter.  Work full time at [employer].   Some high school education.        Mother's history is unknown patient raised by paternal grandmother     Immunization: Immunization History  Administered Date(s) Administered  . Influenza,inj,Quad PF,36+ Mos 12/18/2013  . Pneumococcal Polysaccharide-23 08/29/2012, 11/19/2013  . Tdap 12/18/2013    Cancer Screening:  Pap Smear: Due for pap smear  Mammogram:  Colonoscopy:   Dexa: Not a canidate    Physical Exam: VITALS: Reviewed GEN: Pleasant female, NAD HEENT: Normocephalic, PERRL, EOMI, no scleral icterus, bilateral TM pearly grey, nasal septum midline, MMM, uvula midline, no anterior or posterior lymphadenopathy, no thyromegaly CARDIAC:RRR, S1 and S2 present, no murmur, no heaves/thrills RESP: CTAB, normal effort BREAST:Exam performed in the presence of a chaperone. No masses. No nipple discharge. No axillary lymphadenopathy. ABD: soft, no tenderness, normal bowel sounds GU/GYN:Exam performed in the  presence of a chaperone. Normal external genitalia. Cervix unremarkable. Bimanual exam identified no masses EXT: No edema, 2+ radial and DP pulses SKIN: no rash  ASSESSMENT & PLAN: 51 y.o. female presents for annual well woman/preventative exam and GYN exam. Please see problem specific assessment and plan.   No problem-specific Assessment & Plan notes found for this encounter.

## 2017-04-02 ENCOUNTER — Other Ambulatory Visit: Payer: Self-pay | Admitting: Family Medicine

## 2017-04-17 ENCOUNTER — Other Ambulatory Visit: Payer: Self-pay | Admitting: Family Medicine

## 2017-04-17 DIAGNOSIS — I10 Essential (primary) hypertension: Secondary | ICD-10-CM

## 2017-05-09 ENCOUNTER — Encounter: Payer: BLUE CROSS/BLUE SHIELD | Admitting: Family Medicine

## 2017-05-09 DIAGNOSIS — I5042 Chronic combined systolic (congestive) and diastolic (congestive) heart failure: Secondary | ICD-10-CM | POA: Insufficient documentation

## 2017-05-09 DIAGNOSIS — Z9989 Dependence on other enabling machines and devices: Secondary | ICD-10-CM

## 2017-05-09 DIAGNOSIS — G4733 Obstructive sleep apnea (adult) (pediatric): Secondary | ICD-10-CM | POA: Insufficient documentation

## 2017-06-07 ENCOUNTER — Other Ambulatory Visit: Payer: Self-pay | Admitting: *Deleted

## 2017-06-07 MED ORDER — ATORVASTATIN CALCIUM 40 MG PO TABS: ORAL_TABLET | ORAL | 1 refills | Status: DC

## 2017-06-17 ENCOUNTER — Encounter: Payer: Self-pay | Admitting: *Deleted

## 2017-06-17 ENCOUNTER — Other Ambulatory Visit: Payer: Self-pay | Admitting: *Deleted

## 2017-06-17 DIAGNOSIS — E1142 Type 2 diabetes mellitus with diabetic polyneuropathy: Secondary | ICD-10-CM

## 2017-06-17 MED ORDER — AMLODIPINE BESYLATE 10 MG PO TABS: 10.0000 mg | ORAL_TABLET | Freq: Every day | ORAL | 0 refills | Status: DC

## 2017-06-17 MED ORDER — ATORVASTATIN CALCIUM 40 MG PO TABS: ORAL_TABLET | ORAL | 0 refills | Status: DC

## 2017-06-17 MED ORDER — GABAPENTIN 300 MG PO CAPS: 300.0000 mg | ORAL_CAPSULE | Freq: Three times a day (TID) | ORAL | 0 refills | Status: DC

## 2017-06-17 NOTE — Telephone Encounter (Signed)
Patient completely out of gabapentin, atorvastatin and amlodipine. Has appt to meet new PCP on 07/11/2017. Kinnie Feil, RN, BSN

## 2017-06-27 ENCOUNTER — Encounter: Payer: Self-pay | Admitting: *Deleted

## 2017-06-27 ENCOUNTER — Other Ambulatory Visit: Payer: Self-pay | Admitting: *Deleted

## 2017-06-27 DIAGNOSIS — Z006 Encounter for examination for normal comparison and control in clinical research program: Secondary | ICD-10-CM

## 2017-06-27 MED ORDER — ASPIRIN EC 81 MG PO TBEC: 81.0000 mg | DELAYED_RELEASE_TABLET | Freq: Every day | ORAL | 3 refills | Status: DC

## 2017-06-27 NOTE — Progress Notes (Signed)
TWILIGHT Research study month 15 follow up visit completed. Patient denies any bleeding events. After pill she has been 99 % compliant with ASA/placebo and 96% compliant with Brilinta. She has been instructed to start ASA 81 mg daily. She will have 1 more research required telephone follow up before 15/NOV/2018. Questions encouraged and answered. I thanked the patient for participating in study.

## 2017-07-11 ENCOUNTER — Ambulatory Visit (INDEPENDENT_AMBULATORY_CARE_PROVIDER_SITE_OTHER): Payer: Self-pay | Admitting: Family Medicine

## 2017-07-11 ENCOUNTER — Encounter: Payer: Self-pay | Admitting: Family Medicine

## 2017-07-11 VITALS — BP 118/58 | HR 65 | Temp 98.3°F | Wt 208.0 lb

## 2017-07-11 DIAGNOSIS — D509 Iron deficiency anemia, unspecified: Secondary | ICD-10-CM

## 2017-07-11 DIAGNOSIS — E785 Hyperlipidemia, unspecified: Secondary | ICD-10-CM

## 2017-07-11 DIAGNOSIS — E1159 Type 2 diabetes mellitus with other circulatory complications: Secondary | ICD-10-CM

## 2017-07-11 DIAGNOSIS — Z9989 Dependence on other enabling machines and devices: Secondary | ICD-10-CM

## 2017-07-11 DIAGNOSIS — G4733 Obstructive sleep apnea (adult) (pediatric): Secondary | ICD-10-CM

## 2017-07-11 DIAGNOSIS — E1142 Type 2 diabetes mellitus with diabetic polyneuropathy: Secondary | ICD-10-CM

## 2017-07-11 DIAGNOSIS — Z79899 Other long term (current) drug therapy: Secondary | ICD-10-CM

## 2017-07-11 DIAGNOSIS — I2510 Atherosclerotic heart disease of native coronary artery without angina pectoris: Secondary | ICD-10-CM

## 2017-07-11 DIAGNOSIS — I10 Essential (primary) hypertension: Secondary | ICD-10-CM

## 2017-07-11 LAB — POCT GLYCOSYLATED HEMOGLOBIN (HGB A1C): HEMOGLOBIN A1C: 7.7

## 2017-07-11 MED ORDER — AMLODIPINE BESYLATE 10 MG PO TABS: 10.0000 mg | ORAL_TABLET | Freq: Every day | ORAL | 3 refills | Status: DC

## 2017-07-11 MED ORDER — METFORMIN HCL 1000 MG PO TABS: 1000.0000 mg | ORAL_TABLET | Freq: Two times a day (BID) | ORAL | 3 refills | Status: DC

## 2017-07-11 MED ORDER — HYDROCHLOROTHIAZIDE 12.5 MG PO CAPS: 12.5000 mg | ORAL_CAPSULE | Freq: Every day | ORAL | 3 refills | Status: DC

## 2017-07-11 MED ORDER — GABAPENTIN 300 MG PO CAPS: 900.0000 mg | ORAL_CAPSULE | Freq: Three times a day (TID) | ORAL | 3 refills | Status: DC

## 2017-07-11 MED ORDER — INSULIN GLARGINE 100 UNIT/ML ~~LOC~~ SOLN: 30.0000 [IU] | Freq: Every day | SUBCUTANEOUS | 3 refills | Status: DC

## 2017-07-11 MED ORDER — CARVEDILOL 12.5 MG PO TABS: 12.5000 mg | ORAL_TABLET | Freq: Two times a day (BID) | ORAL | 3 refills | Status: DC

## 2017-07-11 MED ORDER — ATORVASTATIN CALCIUM 40 MG PO TABS: ORAL_TABLET | ORAL | 3 refills | Status: DC

## 2017-07-11 NOTE — Patient Instructions (Signed)
Your blood pressure is very good.  I believe your weight loss is what has really brought your blood pressure down.  If your weight loss continues, we may need to talk about coming off some blood pressure medications in the future.   Your blood sugars are improving.  Again, your weight loss and exercise are and will continue to lower your A1c. Good Job. Keep taking your diabetes medications as you are.    No more than 200 mg of caffeine throughout the day.  Once you are enrolled in the [facility] Card"), we will get your labs, flu vaccination and Pap smear.  Try the Gabapentin at 900 mg three tijmes a day for pain in your feet.  This is three capsules three times a day.  Watch for leg swelling, weight gain and excess sleepiness or fatigue.

## 2017-07-12 NOTE — Assessment & Plan Note (Signed)
Established problem. Stable. Continue current therapy Once enrolled in [facility], will check LDL

## 2017-07-12 NOTE — Assessment & Plan Note (Signed)
Not tolerating mask Once pt has insurance, will refer to Sleep Specialist to see if there is a CPAP mask the pt may tolerate.

## 2017-07-12 NOTE — Assessment & Plan Note (Signed)
Established problem. Stable. Continue current therapy  

## 2017-07-12 NOTE — Assessment & Plan Note (Addendum)
Symptomatically stable LMP two months ago. Hx of HMB.   If enterring climacteric, may be able to build up iron reserves. Once enrolled in [facility], will check Hgb

## 2017-07-12 NOTE — Progress Notes (Signed)
Subjective:    Patient ID: [name], female    DOB: 17-Jan-1966, 51 y.o.   MRN: [id number] ROSEANNA KOPLIN is alone Sources of clinical information for visit is/are patient and past medical records. Nursing assessment for this office visit was reviewed with the patient for accuracy and revision.  Depression screen PHQ 2/9 07/11/2017  Decreased Interest 0  Down, Depressed, Hopeless 0  PHQ - 2 Score 0  Altered sleeping -  Tired, decreased energy -  Change in appetite -  Feeling bad or failure about yourself  -  Trouble concentrating -  Moving slowly or fidgety/restless -  Suicidal thoughts -  PHQ-9 Score -  Some recent data might be hidden   Fall Risk  04/24/2016 04/27/2014  Falls in the past year? Yes No  Number falls in past yr: 1 -  Injury with Fall? Yes -  Risk Factor Category  High Fall Risk -  Risk for fall due to : History of fall(s);Other (Comment) -  Risk for fall due to: Comment Lt knee buckles, bilateral diabetic foot pain -  Follow up Falls evaluation completed -    HPI Problem List Items Addressed This Visit      High   Diabetes mellitus, type 2 ([place]) CHRONIC DIABETES  Disease Monitoring  Blood Sugar Ranges: running in 100s to 200s  Polyuria: no   Visual problems: no   Medication Compliance: yes, taking lantus 30 units daily and metformin 1000 BID  Medication Side Effects  Hypoglycemia: no   Preventitive Health Care             Daily Aspirin: yes              Statin: yes             Dental evaluation in 56-month:             Recent eGFR: > 60             ACEI: Life threatening angioedema requiring intubation  Eye Exam: unable to afford without insurance  Diet pattern: emphasizing vegetables  Exercise: no     Relevant Medications   atorvastatin (LIPITOR) 40 MG tablet   insulin glargine (LANTUS) 100 UNIT/ML injection   metFORMIN (GLUCOPHAGE) 1000 MG tablet   Other Relevant Orders   HgB A1c (Completed)   Morbid (severe) obesity due to excess  calories (HCC) - longstadning - Has - not wearing CPAP because feels like smothering. Mask covers nose and mouth. - Deneis short sleep latency - Pt believes snoring has decreased since working on weight loss   Relevant Medications   insulin glargine (LANTUS) 100 UNIT/ML injection   metFORMIN (GLUCOPHAGE) 1000 MG tablet   CAD, multiple vessel - longstadning - no chest pain, no claudication, no limb weakness/numbness   Relevant Medications   amLODipine (NORVASC) 10 MG tablet   atorvastatin (LIPITOR) 40 MG tablet   carvedilol (COREG) 12.5 MG tablet   hydrochlorothiazide (MICROZIDE) 12.5 MG capsule   Other Relevant Orders   Lipid Panel   Diabetic peripheral neuropathy (HCC) - longstanding - Gabapentin 3028mcapsules, 3 capsules three times a day (Prescribed 2 capsules TID by Dr FlHyman Biblet last ov) has significantly improved pain in feet.   - Denies dizziness, somnolence, falls, unsteadiness, leg edema worsening    Relevant Medications   atorvastatin (LIPITOR) 40 MG tablet   insulin glargine (LANTUS) 100 UNIT/ML injection   metFORMIN (GLUCOPHAGE) 1000 MG tablet   gabapentin (NEURONTIN) 300 MG capsule  Medium   Essential hypertension - CHRONIC HYPERTENSION  Disease Monitoring  Blood pressure range: not checking at home  Chest pain: no   Dyspnea: yes, but not above baseline   Claudication: no   Medication compliance: Yes, Amlodipine 10 mg daily, Carvedilol 12.5 mg BID, HCTZ 12.5 mg daily, ,  Medication Side Effects  Lightheadedness: no   Urinary frequency: no   Edema: no      Relevant Medications   amLODipine (NORVASC) 10 MG tablet   atorvastatin (LIPITOR) 40 MG tablet   carvedilol (COREG) 12.5 MG tablet   hydrochlorothiazide (MICROZIDE) 12.5 MG capsule     Low   Hyperlipidemia with target LDL less than 100 Disease Monitoring: See symptoms for Hypertension  Medications: Compliance- yes, atorvastatin 40 mg daily RUQ pain- no  Muscle aches- no       Relevant  Medications   amLODipine (NORVASC) 10 MG tablet   atorvastatin (LIPITOR) 40 MG tablet   carvedilol (COREG) 12.5 MG tablet   hydrochlorothiazide (MICROZIDE) 12.5 MG capsule   Other Relevant Orders   Lipid Panel   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 Hx HMB - taking an iron tablet daily   Relevant Orders   CBC with Differential   Ferritin   Reticulocytes    Other Visit Diagnoses    High risk medication use       Relevant Orders   CBC with Differential     SH: Lost job with insurance coverage.  Now working at [employer] and will not have insurance coverage for one year per pt.  Smoking status reviewed.   Review of Systems See hpi    Objective:   Physical Exam VS reviewed Vitals:   07/11/17 0850  BP: (!) 118/58  Pulse: 65  Temp: 98.3 F (36.8 C)  SpO2: 95%    GEN: Alert, Cooperative, Groomed, NAD Gait: Normal speed, No significant path deviation, Step through +,  Psych: Normal affect/thought/speech/language     Assessment & Plan:  See problem list

## 2017-07-12 NOTE — Assessment & Plan Note (Signed)
Wt Readings from Last 3 Encounters:  07/11/17 208 lb (94.3 kg)  02/28/17 216 lb 6.4 oz (98.2 kg)  01/22/17 226 lb (102.5 kg)  Established problem that has improved.  Continue current weight loss program with increase in vegetables in diet.

## 2017-07-12 NOTE — Assessment & Plan Note (Signed)
Established problem that has improved.  Continue with Gabapentin 300 mg capsule, three capsule TID

## 2017-07-12 NOTE — Assessment & Plan Note (Signed)
Adequate blood pressure control.  No evidence of new end organ damage.  Tolerating medication without significant adverse effects.  Plan to continue current blood pressure regiment.  With ongoing weight loss, may be able to start reduction in BP meds, like HCTZ

## 2017-07-12 NOTE — Assessment & Plan Note (Addendum)
Lab Results  Component Value Date   HGBA1C 7.7 07/11/2017  Slight improvement from 02/2017 ov primarily due to weight loss. Will reckeck A1c in three months, hopefully with ongoing weight loss will continue to see A1c reduction withpossible insulin reduction in future.  Check Vit B12 next ov given chronic metformin therapy.  Diabetic Eye Exam once insured again.

## 2017-08-02 ENCOUNTER — Other Ambulatory Visit: Payer: Self-pay | Admitting: *Deleted

## 2017-08-02 DIAGNOSIS — E1142 Type 2 diabetes mellitus with diabetic polyneuropathy: Secondary | ICD-10-CM

## 2017-08-02 DIAGNOSIS — E1159 Type 2 diabetes mellitus with other circulatory complications: Secondary | ICD-10-CM

## 2017-08-02 MED ORDER — GABAPENTIN 300 MG PO CAPS: 900.0000 mg | ORAL_CAPSULE | Freq: Three times a day (TID) | ORAL | 3 refills | Status: DC

## 2017-08-02 MED ORDER — INSULIN GLARGINE 100 UNIT/ML SOLOSTAR PEN: 30.0000 [IU] | PEN_INJECTOR | Freq: Every day | SUBCUTANEOUS | 0 refills | Status: DC

## 2017-08-02 MED ORDER — INSULIN GLARGINE 100 UNIT/ML ~~LOC~~ SOLN: 30.0000 [IU] | Freq: Every day | SUBCUTANEOUS | 3 refills | Status: DC

## 2017-08-02 NOTE — Telephone Encounter (Signed)
Patient in clinic, can't afford refill for Lantus.  Lantus SoloStar samples given today.  Clovis Pu, RN

## 2017-08-02 NOTE — Addendum Note (Signed)
Addended by: Clovis Pu on: 08/02/2017 03:18 PM   Modules accepted: Orders

## 2017-09-25 ENCOUNTER — Telehealth: Payer: Self-pay | Admitting: *Deleted

## 2017-09-25 NOTE — Telephone Encounter (Signed)
Several attempts to leave message have been unsuccessful. Daughters number on chart [phone] is not a valid number!

## 2017-11-12 ENCOUNTER — Telehealth: Payer: Self-pay | Admitting: *Deleted

## 2017-11-12 NOTE — Telephone Encounter (Addendum)
Unsuccessful attempt to contact patient for TWILIGHT follow up. Certified letter mailed. 12/09/17 Update: unopened certified letter returned to research office.

## 2018-02-20 ENCOUNTER — Other Ambulatory Visit: Payer: Self-pay | Admitting: Family Medicine

## 2018-02-20 DIAGNOSIS — E1142 Type 2 diabetes mellitus with diabetic polyneuropathy: Secondary | ICD-10-CM

## 2018-03-24 ENCOUNTER — Encounter: Payer: Self-pay | Admitting: *Deleted

## 2018-03-24 NOTE — Progress Notes (Signed)
Patient came in today stating that she is having trouble affording her insulin and has been out of medication for 2 months.  Patient made an appointment to see PCP to discuss and follow up on her diabetes.  Sample of lantus given NDC: 0088-2219-00, lot# [id number] exp 08/04/2020.  Kenyen Candy,CMA

## 2018-04-03 ENCOUNTER — Encounter: Payer: Self-pay | Admitting: Family Medicine

## 2018-04-03 ENCOUNTER — Ambulatory Visit (INDEPENDENT_AMBULATORY_CARE_PROVIDER_SITE_OTHER): Payer: Self-pay | Admitting: Family Medicine

## 2018-04-03 ENCOUNTER — Other Ambulatory Visit: Payer: Self-pay

## 2018-04-03 VITALS — BP 138/74 | HR 72 | Temp 98.8°F | Ht 63.0 in | Wt 226.0 lb

## 2018-04-03 DIAGNOSIS — L089 Local infection of the skin and subcutaneous tissue, unspecified: Secondary | ICD-10-CM

## 2018-04-03 DIAGNOSIS — K219 Gastro-esophageal reflux disease without esophagitis: Secondary | ICD-10-CM

## 2018-04-03 DIAGNOSIS — E1165 Type 2 diabetes mellitus with hyperglycemia: Secondary | ICD-10-CM

## 2018-04-03 DIAGNOSIS — Z1231 Encounter for screening mammogram for malignant neoplasm of breast: Secondary | ICD-10-CM

## 2018-04-03 DIAGNOSIS — Z1239 Encounter for other screening for malignant neoplasm of breast: Secondary | ICD-10-CM

## 2018-04-03 LAB — POCT GLYCOSYLATED HEMOGLOBIN (HGB A1C): HbA1c, POC (controlled diabetic range): 10.7 % — AB (ref 0.0–7.0)

## 2018-04-03 MED ORDER — INSULIN NPH (HUMAN) (ISOPHANE) 100 UNIT/ML ~~LOC~~ SUSP: SUBCUTANEOUS | 11 refills | Status: DC

## 2018-04-03 MED ORDER — RANITIDINE HCL 150 MG PO TABS: ORAL_TABLET | ORAL | 11 refills | Status: DC

## 2018-04-03 MED ORDER — CEPHALEXIN 500 MG PO CAPS: 500.0000 mg | ORAL_CAPSULE | Freq: Four times a day (QID) | ORAL | 0 refills | Status: AC

## 2018-04-03 NOTE — Patient Instructions (Signed)
I believe you have an infected "corn" on your fourth toe.  To heal it we will need to keep the area of qthe corn dry and keep the fourth toe from rubbing against the 5th toe.   Use the Alginate dressing every day to keep the corn area dry and prevent the toe rubbing against other toes.   The fourth toe has some infection in it, I recommend you take an antibiotic for the next week to help the toe to heal.   When you check out at the front desk, ask for the packet for the "[employer]."  It will have the financial information you will need to provide to qualify for the card.   It also has the telephone number to contact to arrange an appointment to complete the application for the "[facility] Card."  Dr Orva Gwaltney callin a medicine like Zantac (Ranitidine) to take when you take your medicine to help prevent reflux indigestion.   Dr Jakyra Kenealy called in a prescription for NPH insulin.  Use this insulin instead of Lantus.   Inject 20 units of NPH insulin about 30 to 60 minutes before your evening meal and Inject 10 units of NPH about 30 to 60 minutes before your morning meals.    I would like to see you back in the office in 2 to 3 weeks to see how the toe corn is healing.   If the toe swells more, gets more red or increases in pain, please let our office know as soon as possible.

## 2018-04-04 NOTE — Assessment & Plan Note (Signed)
Established problem Uncontrolled Patient unable to afford Lantus. Plan: Start NPH 20 units [place] before evening meal, 10 units before morning meal. Continue Metformin.  Given information about apply for [facility] enrollment. Will check Vit B12 (on metformin), Hgb/ftn given hx of IDA, LDL.  Needs dental exam, needs eye exam.

## 2018-04-04 NOTE — Assessment & Plan Note (Signed)
New problem Infected soft corn lateral left 4th toe.  Keflex 500 mg tab, 1 tab QID x 7d. Alginate dressing padding in 3rd and 4th web spaces.  RTC 3 weeks to evaluate.  Return precautions given.
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
## 2018-04-17 ENCOUNTER — Encounter: Payer: Self-pay | Admitting: Family Medicine

## 2018-04-17 ENCOUNTER — Ambulatory Visit (INDEPENDENT_AMBULATORY_CARE_PROVIDER_SITE_OTHER): Payer: Self-pay | Admitting: Family Medicine

## 2018-04-17 ENCOUNTER — Other Ambulatory Visit: Payer: Self-pay

## 2018-04-17 VITALS — BP 130/72 | HR 67 | Temp 97.7°F | Ht 63.0 in | Wt 228.0 lb

## 2018-04-17 DIAGNOSIS — L84 Corns and callosities: Secondary | ICD-10-CM

## 2018-04-17 DIAGNOSIS — B353 Tinea pedis: Secondary | ICD-10-CM

## 2018-04-17 DIAGNOSIS — E1159 Type 2 diabetes mellitus with other circulatory complications: Secondary | ICD-10-CM

## 2018-04-17 HISTORY — DX: Corns and callosities: L84

## 2018-04-17 NOTE — Assessment & Plan Note (Addendum)
Established problem. Uncontrolled based on recent morning glucose readings 248-350. - Has been able to get insulin NPH and is using it 2x daily - Plan: NPH 20 units before morning meal, 15 units before evening meal - Needs test strips for checking glucose daily once she has orange card - Foot exam completed today - Had eye exam earlier this year, but need to get records sent to [facility]

## 2018-04-17 NOTE — Patient Instructions (Signed)
Continue taking you NPH insulin 20 units 30 to 60 minutes prior to your brunch meal and 15 units prior to your night time meal.    Your right foot corn is getting smaller and is less inflamed.    Your left foot corn is new.  You have athlete's foot, an infection with a foot fungus.  This fungus infection makes it easier for toe corns to develop.  The fungus will need to be treated with any over-the-counter athlete's foot powder or spray.    Use the athlete's foot treatment as directed on the container  Continue using the athlete's foot treatment for 2 weeks after the whitish skin between toes is gone.  This will make sure the any spots tiny fungus are killed.   Keep using the alginate dressing between toes after you apply your athlete's foot treatment.  You may use the same alginate dressing over and over for one day, then change the next day.  Once you get your "[employer]" we will make a referral to a podiatrist to help with the treatment of your feet.  We will also get necessary blood tests and give appropriate vaccinations.

## 2018-04-17 NOTE — Progress Notes (Signed)
Date of Visit: 04/17/2018  [name] is accompanied by granddaughter Sources of clinical information for visit is/are patient, relative(s) and past medical records. Nursing assessment for this office visit was reviewed with the patient for accuracy and revision.  Previous Report(s) Reviewed: historical medical records  Depression screen [facility] 2/9 04/17/2018  Decreased Interest 0  Down, Depressed, Hopeless 0  PHQ - 2 Score 0  Altered sleeping -  Tired, decreased energy -  Change in appetite -  Feeling bad or failure about yourself  -  Trouble concentrating -  Moving slowly or fidgety/restless -  Suicidal thoughts -  PHQ-9 Score -  Some recent data might be hidden   Fall Risk  04/24/2016 04/27/2014  Falls in the past year? Yes No  Number falls in past yr: 1 -  Injury with Fall? Yes -  Risk Factor Category  High Fall Risk -  Risk for fall due to : History of fall(s);Other (Comment) -  Risk for fall due to: Comment Lt knee buckles, bilateral diabetic foot pain -  Follow up Falls evaluation completed -    HPI:  [name] is a 52 y.o. here today, accompanied by her granddaughter, to follow up on infection of toe ulcer:  Infection of left 4th toe corn - Finished 7-day course of Keflex, although notes that this "didn't seem to do anything" - Notes that toe is still red and swollen; swelling goes down during the day when she can spend some time off of her feet while at her house, but gets worse when she works at night and is on her feet for several consecutive hours. Lower left leg from mid calf down generally becomes swollen after being on her feet for long periods of time. - Toe becomes stiff and painful with swelling, but no pain at baseline - After her shower, granddaughter cleans area with alcohol and then applies alginate dressing to area - Area seems to be healing well and becoming smaller according to granddaughter who helps her with dressings every day - Denies fever, chills,  fatigue  Soft corn of right 4th toe - Granddaughter noticed a new toe ulcer on lateral side of 4th toe on right foot about 1 week ago; she has been cleaning the area with alcohol and applying the alginate dressing to this ulcer like she is doing for the other side - Denies pain, warmth of area, or purulent exudate from ulcer - No recent change in shoes  Type 2 Diabetes Mellitus - Checked sugar this morning with last test strip and it was 248 - Unable to get more test strips due to issues with insurance coverage; working on getting orange card, but waiting on paperwork from work - Taking insulin NPH, 20 units after breakfast/brunch and 15 units at night after dinner; breakfast tends to be largest meal of day - Endorses tingling in toes - Denies changes in vision; had an eye exam earlier this year, although cannot recall which office she was seen at (it was somewhere on [address]  ROS: See HPI.  PMH: Type 2 diabetes mellitus, CAD, HTN, OSA on CPAP  PHYSICAL EXAM: BP 130/72   Pulse 67   Temp 97.7 F (36.5 C) (Oral)   Ht 5\' 3"  (1.6 m)   Wt 228 lb (103.4 kg)   SpO2 98%   BMI 40.39 kg/m  Gen: Well appearing, sitting comfortably in chair Neuro: grossly intact without focal findings, speech normal Ext: White scale in toe web spaces bilaterally -  Left LE: Trace edema below ankle; 4th toe moderately edematous but without warmth; well-healing ulcer on lateral surface of 4th toe with no local erythema or exudate Right foot: ~1 cm soft corn with central ulceration on lateral surface of 4th toe without exudate or surrounding erythema; no warmth Diabetic Foot Exam - Simple   Simple Foot Form Diabetic Foot exam was performed with the following findings:  Yes 04/17/2018 11:22 AM  Visual Inspection See comments:  Yes Sensation Testing Intact to touch and monofilament testing bilaterally:  Yes Pulse Check Posterior Tibialis and Dorsalis pulse intact bilaterally:  Yes Comments Skin  ulcerations of lateral side of 4th toes bilaterally. No signs of infection.    ASSESSMENT/PLAN:  Health maintenance:  - Overdue for:  - Pap smear  - Pneumovax  - Colonoscopy - Diabetic foot exam completed today; patient has had eye exam recently, but need to get records - Mammogram ordered on 04/03/18 - Need for screening blood tests once patient has orange card/insurance coverage  Toe infection Established problem. - Healing well without signs of infection. - Recommended discontinuing application of alcohol to area to expedite healing. - Instructed to apply athlete's foot powder due to evidence of fungal infection - Advised continued use of alginate dressing padding in 4th web space - Return to clinic in 4 weeks to reevaluate, and refer to podiatrist if orange card has been received   Corn of toe New problem. Soft corn on lateral surface of 4th toe. - No signs of infection - Recommended discontinuing application of alcohol to expedite healing - Instructed to use athlete's foot powder for 2 weeks and apply alginate dressing over corn in 4th web space - Will make podiatry referral once patient has orange card  Diabetes mellitus, type 2 (HCC) Established problem. Uncontrolled based on recent morning glucose readings 248-350. - Has been able to get insulin NPH and is using it 2x daily - Plan: NPH 20 units before morning meal, 15 units before evening meal - Needs test strips for checking glucose daily once she has orange card - Foot exam completed today - Had eye exam earlier this year, but need to get records sent to [facility]  FOLLOW UP: Follow up in 1 month for the above issues.  [name], MS3 [facility], [facility]

## 2018-04-17 NOTE — Assessment & Plan Note (Addendum)
New problem. Soft corn on lateral surface of 4th toe. - No signs of infection - Recommended discontinuing application of alcohol to expedite healing - Instructed to use athlete's foot powder for 2 weeks and apply alginate dressing over corn in 4th web space - Will make podiatry referral once patient has orange card

## 2018-04-17 NOTE — Assessment & Plan Note (Signed)
Established problem. - Healing well without signs of infection. - Recommended discontinuing application of alcohol to area to expedite healing. - Instructed to apply athlete's foot powder due to evidence of fungal infection - Advised continued use of alginate dressing padding in 4th web space - Return to clinic in 4 weeks to reevaluate, and refer to podiatrist if orange card has been received

## 2018-04-18 ENCOUNTER — Encounter: Payer: Self-pay | Admitting: Family Medicine

## 2018-04-18 NOTE — Progress Notes (Signed)
I have interviewed and examined the patient with MS [place].  I have discussed the case and verified the key findings with her.   I agree with her assessments and plans as documented in 6/13 office visit note. My additions and revisions are noted in red color font.   Addition diagnosis 1) bilateral tinea pedis - likely contributory to development of soft corns.  Treatment with topical antifungal powder or spray to feet and between toes. Follow up in 3 to 4 weeks to assess response.

## 2018-05-15 ENCOUNTER — Encounter: Payer: Self-pay | Admitting: Family Medicine

## 2018-05-15 ENCOUNTER — Other Ambulatory Visit: Payer: Self-pay

## 2018-05-15 ENCOUNTER — Ambulatory Visit (INDEPENDENT_AMBULATORY_CARE_PROVIDER_SITE_OTHER): Payer: Self-pay | Admitting: Family Medicine

## 2018-05-15 DIAGNOSIS — L84 Corns and callosities: Secondary | ICD-10-CM

## 2018-05-15 DIAGNOSIS — E1165 Type 2 diabetes mellitus with hyperglycemia: Secondary | ICD-10-CM

## 2018-05-15 MED ORDER — INSULIN REGULAR HUMAN 100 UNIT/ML IJ SOLN: 5.0000 [IU] | Freq: Two times a day (BID) | INTRAMUSCULAR | Status: DC

## 2018-05-15 NOTE — Patient Instructions (Signed)
Your blood sugars are still too high to heal your foot wound.  We should start a short acting Regular insulin in addition to your NPH insulin.  Take the Regular insulin and NPH insulin injections together 30 minutes before your meals.   The new bandage on your corn would be changed only once a week.  If the bandage peals off before a week, just replace it with a new bandage.  Try not to take off the new bandage early if you can, it help trap healing hormones against the wound to help it heal.   I would like to see you back in a month to check your sugars and wee you foot corn.    I am glad you have completed the application for the [facility]. Once you have it we will get referrals and other medical issues addressed.

## 2018-05-16 ENCOUNTER — Encounter: Payer: Self-pay | Admitting: Family Medicine

## 2018-05-16 NOTE — Assessment & Plan Note (Signed)
Established problem Uncontrolled glycemia Continue NPH 20 before morning meal and 14 units before evening meals. Start Regular insulin 5 units before morning meal and 5 units before evening meal.  Will titrate based on CBG that pt is to record and bring to next ov.   Test strips once orange card

## 2018-05-16 NOTE — Progress Notes (Signed)
   Subjective:    Patient ID: [name], female    DOB: 11-17-1965, 52 y.o.   MRN: [id number]  HPI   Soft corn, right foot - left corn healed with alginate padding and successful treatment of tinea pedis of both feetwith Lotrimin spray.  - Right 4th lateral 4th toe open soft corn persisting. - pt still applying alcohol to wound despite recommendation last visit that this practice would delay or prevent wound healing.  Granddgt assists with the foot care.  - Using alginate between 4th and 5th toe as padding and drying agent and separation of toes.   Diabetes  Disease Monitoring  Blood Sugar Ranges: running in 300s  Polyuria: no   Visual problems: no   Medication Compliance: yes  Medication Side Effects  Hypoglycemia: no      doccumentation required for [facility] "organge card" Will pursue consultation with [facility] once [facility] card comes thru and Health Maintenance issues.  Health maintenance:  - Overdue for:             - Pap smear             - Pneumovax             - Colonoscopy - Diabetic foot exam completed today; patient has had eye exam recently, but need to get records - Mammogram ordered on 04/03/18 - Need for screening blood tests once patient has orange card/insurance coverage  Review of Systems See hpi    Objective:   Physical Exam VS reviewed GEN: Alert, Cooperative, Groomed, NAD Ext: no maceration in web spaces of either foot. Evidence of lamisil spray pwoder on foot web spaces.  Soft open corn right lateral 4th toe of left foot.   approx 7-10 mm wide with 3-4 mm opening.  Base is white.   No surrounding erythema.  Left foot without corn.  4th toe remains slgihtly swollen and erythematous on dorsum, mildy tender, no increasedwarmth.     Assessment & Plan:

## 2018-05-16 NOTE — Assessment & Plan Note (Signed)
Established problem Right 4 toe corn slowly improving.  Tinae pedis under control with lotrimin spray.  Apply colloidal dressing once a week to corn to cushion it from other toe and discourage applying alcohol to site.  Await orange card for referal to [facility] podiatry. -

## 2018-05-21 ENCOUNTER — Ambulatory Visit: Payer: Self-pay

## 2018-06-19 ENCOUNTER — Ambulatory Visit: Payer: Self-pay | Admitting: Family Medicine

## 2018-06-21 ENCOUNTER — Other Ambulatory Visit: Payer: Self-pay | Admitting: Family Medicine

## 2018-06-21 DIAGNOSIS — I10 Essential (primary) hypertension: Secondary | ICD-10-CM

## 2018-07-17 ENCOUNTER — Emergency Department (HOSPITAL_COMMUNITY)
Admission: EM | Admit: 2018-07-17 | Discharge: 2018-07-17 | Disposition: A | Discharge status: Home / Self Care | Payer: Self-pay | Attending: Emergency Medicine | Admitting: Emergency Medicine

## 2018-07-17 ENCOUNTER — Encounter (HOSPITAL_COMMUNITY): Payer: Self-pay | Admitting: Emergency Medicine

## 2018-07-17 DIAGNOSIS — Z955 Presence of coronary angioplasty implant and graft: Secondary | ICD-10-CM | POA: Insufficient documentation

## 2018-07-17 DIAGNOSIS — Z79899 Other long term (current) drug therapy: Secondary | ICD-10-CM | POA: Insufficient documentation

## 2018-07-17 DIAGNOSIS — Z7982 Long term (current) use of aspirin: Secondary | ICD-10-CM | POA: Insufficient documentation

## 2018-07-17 DIAGNOSIS — I110 Hypertensive heart disease with heart failure: Secondary | ICD-10-CM | POA: Insufficient documentation

## 2018-07-17 DIAGNOSIS — I2510 Atherosclerotic heart disease of native coronary artery without angina pectoris: Secondary | ICD-10-CM | POA: Insufficient documentation

## 2018-07-17 DIAGNOSIS — E119 Type 2 diabetes mellitus without complications: Secondary | ICD-10-CM | POA: Insufficient documentation

## 2018-07-17 DIAGNOSIS — Z794 Long term (current) use of insulin: Secondary | ICD-10-CM | POA: Insufficient documentation

## 2018-07-17 DIAGNOSIS — K047 Periapical abscess without sinus: Secondary | ICD-10-CM | POA: Insufficient documentation

## 2018-07-17 DIAGNOSIS — I252 Old myocardial infarction: Secondary | ICD-10-CM | POA: Insufficient documentation

## 2018-07-17 DIAGNOSIS — I5042 Chronic combined systolic (congestive) and diastolic (congestive) heart failure: Secondary | ICD-10-CM | POA: Insufficient documentation

## 2018-07-17 MED ORDER — AMOXICILLIN 500 MG PO CAPS: 500.0000 mg | ORAL_CAPSULE | Freq: Three times a day (TID) | ORAL | 0 refills | Status: DC

## 2018-07-17 MED ORDER — NAPROXEN 500 MG PO TABS: 500.0000 mg | ORAL_TABLET | Freq: Two times a day (BID) | ORAL | 0 refills | Status: DC

## 2018-07-17 NOTE — Discharge Instructions (Signed)
Follow-up with a dentist as soon as possible.

## 2018-07-17 NOTE — ED Triage Notes (Signed)
Pt c/o facial sinus pain for couple days. Denies cough, congestion, sore throat or ear pain.

## 2018-07-17 NOTE — ED Provider Notes (Signed)
[facility] DEPT Provider Note   CSN: [id number] Arrival date & time: 07/17/18  1128     History   Chief Complaint Chief Complaint  Patient presents with  . Facial Pain    HPI [name] is a 52 y.o. female who presents to the ED with facial pain. The pain is located in the sinus area and started a couple days ago. Patient denies cough, congestion, sore throat or ear pain.   HPI  Past Medical History:  Diagnosis Date  . Abnormal bruising 06/18/2016  . ARF (acute respiratory failure) ([place]) 02/10/2016  . CAD (coronary artery disease) 02/10/2016   a. 03/14/2016 Staged PCI with DES to mid RCA. b. NSTEMI 02/2016: DES to mid-LAD, POBA to 1st Diag, 85% stenosis of RCA noted with staged PCI recommended  . Chronic combined systolic (congestive) and diastolic (congestive) heart failure ([place])    a. Echo 02/2016: EF 40-45% w/ Grade 2 DD. 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  Marland Kitchen Dysphagia 03/09/2016   Referred to GI [facility] 03/09/2016    . Dyspnea 03/09/2016   Off ACEi since 02/19/16 -Spirometry 03/09/2016  No airflow obst     . Fall 03/01/2016  . Flash pulmonary edema ([place]) 02/10/2016   requiring intubation  . Hypertension   . Hypokalemia 03/08/2017  . Leukocytosis 05/21/2016  . Meralgia paresthetica of right side 12/18/2013  . Myocardial infarction ([place]) 02/2016  . NSTEMI (non-ST elevated myocardial infarction) ([place])   . Obesity (BMI 30-39.9) 04/03/2016  . Orthostatic hypotension 04/27/2014  . OSA (obstructive sleep apnea)   . OSA on CPAP   . Required emergent intubation 02/10/2016  . Stridor 02/19/2016  . Type II diabetes mellitus [facility])     Patient Active Problem List   Diagnosis Date Noted  . Corn of toe 04/17/2018  . Chronic combined systolic (congestive) and diastolic (congestive) heart failure ([place])   . OSA on CPAP   . Depression 06/28/2016  . Anemia 05/18/2016  . Preventative health care 04/19/2016  . Diabetic peripheral  neuropathy ([place]) 04/19/2016  . Constipation 03/07/2016  . CAD, multiple vessel 02/20/2016  . Hypertensive heart disease with heart failure ([place])   . CAD (coronary artery disease) 02/10/2016  . Myocardial infarction ([place]) 02/04/2016  . GERD (gastroesophageal reflux disease) 12/03/2013  . Chronic combined systolic and diastolic congestive heart failure ([place]) 11/16/2013  . Degenerative disk disease 08/12/2012  . Hyperlipidemia with target LDL less than 100 08/11/2012  . Essential hypertension 07/26/2012  . Type 2 diabetes mellitus with other circulatory complications ([place]) 98/33/8250  . Morbid (severe) obesity due to excess calories ([place]) 07/26/2012    Past Surgical History:  Procedure Laterality Date  . ANTERIOR CERVICAL DECOMP/DISCECTOMY FUSION  08/28/2012   Procedure: ANTERIOR CERVICAL DECOMPRESSION/DISCECTOMY FUSION 2 LEVELS;  Surgeon: Otilio Connors, MD;  Location: [facility]URO ORS;  Service: Neurosurgery;  Laterality: N/A;  Cervical four-five, cervical six-seven Anterior cervical decompression/diskectomy/fusion/LifeNet Bone/Trestle plate  . BACK SURGERY    . BREAST CYST EXCISION Right   . CARDIAC CATHETERIZATION N/A 02/15/2016   Procedure: Left Heart Cath and Coronary Angiography;  Surgeon: Wellington Hampshire, MD;  Location: [facility] CV LAB;  Service: Cardiovascular;  Laterality: N/A;  . CARDIAC CATHETERIZATION N/A 02/15/2016   Procedure: Coronary Stent Intervention;  Surgeon: Wellington Hampshire, MD;  Location: [facility] CV LAB;  Service: Cardiovascular;  Laterality: N/A;  . CARDIAC CATHETERIZATION N/A 02/15/2016   Procedure: Coronary Balloon Angioplasty;  Surgeon: Wellington Hampshire,  MD;  Location: [facility] CV LAB;  Service: Cardiovascular;  Laterality: N/A;  . CARDIAC CATHETERIZATION N/A 03/14/2016   Procedure: Coronary Stent Intervention;  Surgeon: Wellington Hampshire, MD;  Location: [facility] LAB;  Service: Cardiovascular;  Laterality: N/A;  . CORONARY ANGIOPLASTY WITH STENT PLACEMENT     . SPLIT NIGHT STUDY  04/07/2016     OB History   None      Home Medications    Prior to Admission medications   Medication Sig Start Date End Date Taking? Authorizing Provider  amLODipine (NORVASC) 10 MG tablet Take 1 tablet (10 mg total) by mouth daily. 07/11/17   McDiarmid, Blane Ohara, MD  amoxicillin (AMOXIL) 500 MG capsule Take 1 capsule (500 mg total) by mouth 3 (three) times daily. 07/17/18   Ashley Murrain, NP  aspirin EC 81 MG tablet Take 1 tablet (81 mg total) by mouth daily. 06/27/17   Wellington Hampshire, MD  atorvastatin (LIPITOR) 40 MG tablet TAKE ONE TABLET BY MOUTH ONCE DAILY 07/11/17   McDiarmid, Blane Ohara, MD  blood glucose meter kit and supplies KIT Dispense based on patient and insurance preference. Use up to four times daily as directed. (FOR ICD-9 250.00, 250.01). 02/17/16   Theodis Blaze, MD  carvedilol (COREG) 12.5 MG tablet Take 1 tablet (12.5 mg total) by mouth 2 (two) times daily with a meal. 07/11/17   McDiarmid, Blane Ohara, MD  gabapentin (NEURONTIN) 300 MG capsule Take 3 capsules (900 mg total) by mouth 3 (three) times daily. 08/02/17   McDiarmid, Blane Ohara, MD  hydrochlorothiazide (MICROZIDE) 12.5 MG capsule TAKE 1 CAPSULE BY MOUTH ONCE DAILY 06/24/18   McDiarmid, Blane Ohara, MD  insulin NPH Human (HUMULIN N,NOVOLIN N) 100 UNIT/ML injection Inject subcutaneously 20 units 30 minutes before evening meal and inject subcutaneously 10 units 30 minutes before morning meal. 04/03/18   McDiarmid, Blane Ohara, MD  INSULIN SYRINGE 1CC/29G 29G X 1/2" 1 ML MISC Use to inject insulin daily. 02/15/17   Lupita Dawn, MD  metFORMIN (GLUCOPHAGE) 1000 MG tablet Take 1 tablet (1,000 mg total) by mouth 2 (two) times daily with a meal. 07/11/17   McDiarmid, Blane Ohara, MD  naproxen (NAPROSYN) 500 MG tablet Take 1 tablet (500 mg total) by mouth 2 (two) times daily. 07/17/18   Ashley Murrain, NP  polyethylene glycol [facility] / GLYCOLAX) packet Take 17 g by mouth as needed for mild constipation or moderate constipation.      [provider]  ranitidine (ZANTAC) 150 MG tablet Take 1 tablet by mouth with other medications 04/03/18   McDiarmid, Blane Ohara, MD    Family History Family History  Problem Relation Age of Onset  . Heart disease Father 59       MI  . Heart disease Paternal Grandmother 36  . Diabetes Paternal Grandmother   . Hypertension Paternal Grandmother   . Heart disease Paternal Aunt   . Cancer Neg Hx   . Stroke Neg Hx   . Alcohol abuse Neg Hx     Social History Social History   Tobacco Use  . Smoking status: Never Smoker  . Smokeless tobacco: Never Used  Substance Use Topics  . Alcohol use: Yes    Comment: 03/14/2016 "I'll have a couple drinks on major holidays"  . Drug use: Yes    Types: Marijuana    Comment: 03/14/2016 "last use was in 02/2016"     Allergies   Ace inhibitors   Review of Systems  Review of Systems  HENT: Positive for dental problem and facial swelling.   All other systems reviewed and are negative.    Physical Exam Updated Vital Signs BP 139/72 (BP Location: Left Arm)   Pulse 63   Temp 98.4 F (36.9 C) (Oral)   Resp 18   SpO2 97%   Physical Exam  Constitutional: She appears well-developed and well-nourished. No distress.  HENT:  Right Ear: Tympanic membrane normal.  Left Ear: Tympanic membrane normal.  Mouth/Throat: Uvula is midline. Abnormal dentition. Dental caries present. No posterior oropharyngeal edema or posterior oropharyngeal erythema.    Facial swelling left side. Upper left dental area with decayed tooth and tenderness and swelling of the gum surrounding the tooth.  Eyes: Conjunctivae and EOM are normal.  Neck: Neck supple.  Cardiovascular: Normal rate.  Pulmonary/Chest: Effort normal.  Musculoskeletal: Normal range of motion.  Lymphadenopathy:    She has no cervical adenopathy.  Neurological: She is alert.  Skin: Skin is warm and dry.  Psychiatric: She has a normal mood and affect.  Nursing note and vitals  reviewed.    ED Treatments / Results  Labs (all labs ordered are listed, but only abnormal results are displayed) Labs Reviewed - No data to display  Procedures Procedures (including critical care time)  Medications Ordered in ED Medications - No data to display   Initial Impression / Assessment and Plan / ED Course  I have reviewed the triage vital signs and the nursing notes. Patient with toothache.  No gross abscess.  Exam unconcerning for Ludwig's angina or spread of infection.  Will treat with penicillin and anti-inflammatories medicine.  Urged patient to follow-up with dentist.  Dental resource given.   Final Clinical Impressions(s) / ED Diagnoses   Final diagnoses:  Dental infection    ED Discharge Orders         Ordered    amoxicillin (AMOXIL) 500 MG capsule  3 times daily     07/17/18 1353    naproxen (NAPROSYN) 500 MG tablet  2 times daily     07/17/18 1353           Janit Bern [place], NP 07/17/18 1358    Drenda Freeze, MD 07/18/18 [phone]

## 2018-07-17 NOTE — ED Notes (Signed)
Pt left without d/c instructions. Rx had been called into [facility] but patient states she does not get prescriptions filled at that pharmacy. Pharmacy called and Rx canceled. Pt given new script and left prior to receiving new instructions.

## 2018-08-08 ENCOUNTER — Other Ambulatory Visit: Payer: Self-pay | Admitting: Family Medicine

## 2018-08-08 DIAGNOSIS — E1142 Type 2 diabetes mellitus with diabetic polyneuropathy: Secondary | ICD-10-CM

## 2018-08-19 ENCOUNTER — Other Ambulatory Visit: Payer: Self-pay | Admitting: Family Medicine

## 2018-09-11 ENCOUNTER — Other Ambulatory Visit: Payer: Self-pay | Admitting: Family Medicine

## 2018-09-11 DIAGNOSIS — E1142 Type 2 diabetes mellitus with diabetic polyneuropathy: Secondary | ICD-10-CM

## 2018-10-07 ENCOUNTER — Ambulatory Visit (INDEPENDENT_AMBULATORY_CARE_PROVIDER_SITE_OTHER): Payer: Self-pay | Admitting: Family Medicine

## 2018-10-07 ENCOUNTER — Encounter: Payer: Self-pay | Admitting: Family Medicine

## 2018-10-07 ENCOUNTER — Other Ambulatory Visit: Payer: Self-pay

## 2018-10-07 VITALS — BP 145/82 | HR 63 | Temp 98.6°F | Wt 234.0 lb

## 2018-10-07 DIAGNOSIS — R1030 Lower abdominal pain, unspecified: Secondary | ICD-10-CM

## 2018-10-07 DIAGNOSIS — Z794 Long term (current) use of insulin: Secondary | ICD-10-CM

## 2018-10-07 DIAGNOSIS — E1159 Type 2 diabetes mellitus with other circulatory complications: Secondary | ICD-10-CM

## 2018-10-07 DIAGNOSIS — E1165 Type 2 diabetes mellitus with hyperglycemia: Secondary | ICD-10-CM

## 2018-10-07 LAB — POCT GLYCOSYLATED HEMOGLOBIN (HGB A1C): HBA1C, POC (CONTROLLED DIABETIC RANGE): 7.2 % — AB (ref 0.0–7.0)

## 2018-10-07 NOTE — Patient Instructions (Signed)
It was a pleasure to see you today! Thank you for choosing [facility] for your primary care. [name] was seen for belly pain and diabetes check. Come back to the clinic if you have any routine needs, and go to the emergency room if you have any life-threatening symptoms.   Today we gave you a note to try and have access to a stool at work.  We also checked your A1c which was a 7.2.  This is an improvement from your prior check over 10.  Continue taking your NPH 25 units 2 times per day.  Do not forget to call in and ask for a repeat fill of your glucose meter and test strip prescription once your insurance becomes active in January.   Please bring all your medications to every doctors visit   Sign up for My Chart to have easy access to your labs results, and communication with your Primary care physician.     Please check-out at the front desk before leaving the clinic.     Best,  Dr. Marthenia Rolling FAMILY MEDICINE RESIDENT - PGY2 10/07/2018 2:09 PM

## 2018-10-07 NOTE — Progress Notes (Signed)
    Subjective:  [name] is a 52 y.o. female who presents to the [facility] today with a chief complaint of lower belly pain when standing for long periods of time at work and diabetes check.   HPI: Patient is a Conservation officer, nature at a gas station, she says that she stands for 8 hours at a time.  Roughly 4 hours into her shift she begins to have pain under her pannus.  This is usually resolved with either propping up on leg or sitting down.  Even 5 minutes of doing this resets her pain and limits her to get back to work.  She has had no injuries no bowel symptoms no urinary symptoms, no changes in activity level, no significant changes in weight lately, no significant medication changes.  No nausea vomiting no appetite changes.  This pain is rated a 6 out of 10 does not radiate it is alleviated by changing position no particular thing makes it worse other time standing.  This is the first instance of her having this it is been going on for roughly 1 month    Objective:  Physical Exam: BP (!) 145/82   Pulse 63   Temp 98.6 F (37 C) (Oral)   Wt 234 lb (106.1 kg)   LMP 01/13/2018 (LMP Unknown)   SpO2 96%   BMI 41.45 kg/m   Gen: Obese, NAD, resting comfortably CV: RRR with no murmurs appreciated Pulm: NWOB, CTAB with no crackles, wheezes, or rhonchi GI: Normal bowel sounds present. Soft, Nontender, Nondistended. MSK: no edema, cyanosis, or clubbing noted Skin: warm, dry, large pannus at abdomen no sign of infection Neuro: grossly normal, moves all extremities Psych: Normal affect and thought content  Results for orders placed or performed in visit on 10/07/18 (from the past 72 hour(s))  HgB A1c     Status: Abnormal   Collection Time: 10/07/18  1:45 PM  Result Value Ref Range   Hemoglobin A1C     HbA1c POC (<> result, manual entry)     HbA1c, POC (prediabetic range)     HbA1c, POC (controlled diabetic range) 7.2 (A) 0.0 - 7.0 %     Assessment/Plan:  Type 2 diabetes mellitus with other  circulatory complications (HCC) Patient says she is only been taking the NPH, she has been taking 25 units twice daily has not been taking short acting.  A1c down to 7.2 from over 10.  We discussed with her that after she gets her insurance in January and that she needs to refill her sugar testing supplies so that we can continue to titrate her insulin. we had significant discussion about the dangers of not taking insulin without the pill check her blood sugars  Lower abdominal pain Physical exam and story consistent with pain related to large pannus because patient is obese.  Pain is worse when standing for long peers of time and changes as soon as she props her leg up or sits which offers some support to the pannus.  Patient denies rash, wound, bowel or urinary symptoms, belly pain when sitting, nausea vomiting, change in appetite, change in weight, febrile symptoms.  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  We also discussed need to control diet which patient understands but is doubtful about as we are approaching the holiday season   Marthenia Rolling, [place] FAMILY MEDICINE RESIDENT - PGY2 10/08/2018 7:19 PM

## 2018-10-08 DIAGNOSIS — R1030 Lower abdominal pain, unspecified: Secondary | ICD-10-CM | POA: Insufficient documentation

## 2018-10-08 MED ORDER — INSULIN NPH (HUMAN) (ISOPHANE) 100 UNIT/ML ~~LOC~~ SUSP: 25.0000 [IU] | Freq: Two times a day (BID) | SUBCUTANEOUS | 11 refills | Status: DC

## 2018-10-08 NOTE — Assessment & Plan Note (Signed)
Physical exam and story consistent with pain related to large pannus because patient is obese.  Pain is worse when standing for long peers of time and changes as soon as she props her leg up or sits which offers some support to the pannus.  Patient denies rash, wound, bowel or urinary symptoms, belly pain when sitting, nausea vomiting, change in appetite, change in weight, febrile symptoms.  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  We also discussed need to control diet which patient understands but is doubtful about as we are approaching the holiday season

## 2018-10-08 NOTE — Assessment & Plan Note (Signed)
Patient says she is only been taking the NPH, she has been taking 25 units twice daily has not been taking short acting.  A1c down to 7.2 from over 10.  We discussed with her that after she gets her insurance in January and that she needs to refill her sugar testing supplies so that we can continue to titrate her insulin. we had significant discussion about the dangers of not taking insulin without the pill check her blood sugars

## 2019-01-11 ENCOUNTER — Other Ambulatory Visit: Payer: Self-pay | Admitting: Family Medicine

## 2019-01-11 DIAGNOSIS — E1142 Type 2 diabetes mellitus with diabetic polyneuropathy: Secondary | ICD-10-CM

## 2019-01-15 ENCOUNTER — Encounter: Payer: Self-pay | Admitting: Family Medicine

## 2019-01-15 ENCOUNTER — Other Ambulatory Visit: Payer: Self-pay

## 2019-01-15 ENCOUNTER — Ambulatory Visit (INDEPENDENT_AMBULATORY_CARE_PROVIDER_SITE_OTHER): Payer: BLUE CROSS/BLUE SHIELD | Admitting: Family Medicine

## 2019-01-15 VITALS — BP 132/74 | HR 79 | Temp 99.0°F | Ht 63.0 in | Wt 233.0 lb

## 2019-01-15 DIAGNOSIS — Z8614 Personal history of Methicillin resistant Staphylococcus aureus infection: Secondary | ICD-10-CM

## 2019-01-15 DIAGNOSIS — Z79899 Other long term (current) drug therapy: Secondary | ICD-10-CM | POA: Diagnosis not present

## 2019-01-15 DIAGNOSIS — I214 Non-ST elevation (NSTEMI) myocardial infarction: Secondary | ICD-10-CM | POA: Diagnosis not present

## 2019-01-15 DIAGNOSIS — E1159 Type 2 diabetes mellitus with other circulatory complications: Secondary | ICD-10-CM | POA: Diagnosis not present

## 2019-01-15 DIAGNOSIS — I2510 Atherosclerotic heart disease of native coronary artery without angina pectoris: Secondary | ICD-10-CM | POA: Diagnosis not present

## 2019-01-15 DIAGNOSIS — E119 Type 2 diabetes mellitus without complications: Secondary | ICD-10-CM

## 2019-01-15 DIAGNOSIS — I110 Hypertensive heart disease with heart failure: Secondary | ICD-10-CM

## 2019-01-15 DIAGNOSIS — E785 Hyperlipidemia, unspecified: Secondary | ICD-10-CM

## 2019-01-15 DIAGNOSIS — E1142 Type 2 diabetes mellitus with diabetic polyneuropathy: Secondary | ICD-10-CM

## 2019-01-15 DIAGNOSIS — Z794 Long term (current) use of insulin: Secondary | ICD-10-CM

## 2019-01-15 DIAGNOSIS — N951 Menopausal and female climacteric states: Secondary | ICD-10-CM

## 2019-01-15 DIAGNOSIS — I5042 Chronic combined systolic (congestive) and diastolic (congestive) heart failure: Secondary | ICD-10-CM

## 2019-01-15 DIAGNOSIS — I10 Essential (primary) hypertension: Secondary | ICD-10-CM

## 2019-01-15 DIAGNOSIS — D509 Iron deficiency anemia, unspecified: Secondary | ICD-10-CM | POA: Diagnosis not present

## 2019-01-15 DIAGNOSIS — L84 Corns and callosities: Secondary | ICD-10-CM

## 2019-01-15 DIAGNOSIS — Z780 Asymptomatic menopausal state: Secondary | ICD-10-CM

## 2019-01-15 DIAGNOSIS — Z888 Allergy status to other drugs, medicaments and biological substances status: Secondary | ICD-10-CM

## 2019-01-15 DIAGNOSIS — R942 Abnormal results of pulmonary function studies: Secondary | ICD-10-CM

## 2019-01-15 LAB — POCT GLYCOSYLATED HEMOGLOBIN (HGB A1C): HBA1C, POC (CONTROLLED DIABETIC RANGE): 7.9 % — AB (ref 0.0–7.0)

## 2019-01-15 MED ORDER — METFORMIN HCL 1000 MG PO TABS: ORAL_TABLET | ORAL | 3 refills | Status: DC

## 2019-01-15 MED ORDER — BLOOD GLUCOSE MONITOR KIT: PACK | 0 refills | Status: DC

## 2019-01-15 MED ORDER — HYDROCHLOROTHIAZIDE 12.5 MG PO CAPS: 12.5000 mg | ORAL_CAPSULE | Freq: Every day | ORAL | 3 refills | Status: DC

## 2019-01-15 MED ORDER — AMLODIPINE BESYLATE 10 MG PO TABS: 10.0000 mg | ORAL_TABLET | Freq: Every day | ORAL | 5 refills | Status: DC

## 2019-01-15 MED ORDER — CARVEDILOL 12.5 MG PO TABS: 12.5000 mg | ORAL_TABLET | Freq: Two times a day (BID) | ORAL | 3 refills | Status: DC

## 2019-01-15 MED ORDER — ATORVASTATIN CALCIUM 40 MG PO TABS: ORAL_TABLET | ORAL | 11 refills | Status: DC

## 2019-01-15 NOTE — Patient Instructions (Signed)
We will arrange referrals to Podiatry and Ophthamology.  Let our office know if you have not heard from these referrals within 10 days.   Dr Sible Straley will look into the new diabetes medication called Semaglutide.  It works just as well at reducing blood sugars as the injectable forms of similar medications.  It also help people loss more weight than the injectables.    Dr Humaira Sculley sent in a beginner's glucometer kit.  When you are starting to run low on glucometer sticks, let our office know the type of glucometer you got so we can send in refills for the right type of glucometer.    Your A1c was 7.9%, slightly up form 7.2% in December.  Again, we will see if we can get you the Semaglutide at a reasonable price to help lower your blood sugars some.    We will talk about screening for colon cancer at a future visit.   It is time for a Pap smear.  We can talk about how you want to handle this at your next office visit with Dr Tracia Lacomb.    Refills were sent in for your prescription medications.    Dr Pete Merten will call you if your tests are not good. Otherwise he will send you a letter.  If you sign up for MyChart online, you will be able to see your test results once Dr Reginae Wolfrey has reviewed them.  If you do not hear from [place] with in 2 weeks please call our office

## 2019-01-15 NOTE — Progress Notes (Signed)
n-6

## 2019-01-16 ENCOUNTER — Encounter: Payer: Self-pay | Admitting: Family Medicine

## 2019-01-16 ENCOUNTER — Telehealth: Payer: Self-pay | Admitting: Pharmacist

## 2019-01-16 DIAGNOSIS — Z780 Asymptomatic menopausal state: Secondary | ICD-10-CM

## 2019-01-16 DIAGNOSIS — Z794 Long term (current) use of insulin: Secondary | ICD-10-CM

## 2019-01-16 DIAGNOSIS — E119 Type 2 diabetes mellitus without complications: Secondary | ICD-10-CM | POA: Insufficient documentation

## 2019-01-16 DIAGNOSIS — Z888 Allergy status to other drugs, medicaments and biological substances status: Secondary | ICD-10-CM | POA: Insufficient documentation

## 2019-01-16 DIAGNOSIS — N951 Menopausal and female climacteric states: Secondary | ICD-10-CM | POA: Insufficient documentation

## 2019-01-16 HISTORY — DX: Asymptomatic menopausal state: Z78.0

## 2019-01-16 HISTORY — DX: Menopausal and female climacteric states: N95.1

## 2019-01-16 HISTORY — DX: Allergy status to other drugs, medicaments and biological substances: Z88.8

## 2019-01-16 LAB — CBC WITH DIFFERENTIAL/PLATELET
BASOS ABS: 0.1 10*3/uL (ref 0.0–0.2)
Basos: 1 %
EOS (ABSOLUTE): 0.4 10*3/uL (ref 0.0–0.4)
EOS: 4 %
HEMATOCRIT: 36.1 % (ref 34.0–46.6)
HEMOGLOBIN: 11.8 g/dL (ref 11.1–15.9)
IMMATURE GRANULOCYTES: 1 %
Immature Grans (Abs): 0.1 10*3/uL (ref 0.0–0.1)
LYMPHS ABS: 2.5 10*3/uL (ref 0.7–3.1)
Lymphs: 22 %
MCH: 25 pg — ABNORMAL LOW (ref 26.6–33.0)
MCHC: 32.7 g/dL (ref 31.5–35.7)
MCV: 77 fL — ABNORMAL LOW (ref 79–97)
Monocytes Absolute: 0.6 10*3/uL (ref 0.1–0.9)
Monocytes: 6 %
NEUTROS PCT: 66 %
Neutrophils Absolute: 7.4 10*3/uL — ABNORMAL HIGH (ref 1.4–7.0)
Platelets: 232 10*3/uL (ref 150–450)
RBC: 4.72 x10E6/uL (ref 3.77–5.28)
RDW: 15.9 % — ABNORMAL HIGH (ref 11.7–15.4)
WBC: 11.1 10*3/uL — AB (ref 3.4–10.8)

## 2019-01-16 LAB — CMP14+EGFR
A/G RATIO: 1.5 (ref 1.2–2.2)
ALT: 28 IU/L (ref 0–32)
AST: 27 IU/L (ref 0–40)
Albumin: 4.1 g/dL (ref 3.8–4.9)
Alkaline Phosphatase: 71 IU/L (ref 39–117)
BILIRUBIN TOTAL: 0.5 mg/dL (ref 0.0–1.2)
BUN / CREAT RATIO: 16 (ref 9–23)
BUN: 16 mg/dL (ref 6–24)
CHLORIDE: 97 mmol/L (ref 96–106)
CO2: 26 mmol/L (ref 20–29)
Calcium: 10 mg/dL (ref 8.7–10.2)
Creatinine, Ser: 1.02 mg/dL — ABNORMAL HIGH (ref 0.57–1.00)
GFR calc Af Amer: 74 mL/min/{1.73_m2} (ref >59)
GFR calc non Af Amer: 64 mL/min/{1.73_m2} (ref >59)
Globulin, Total: 2.7 g/dL (ref 1.5–4.5)
Glucose: 161 mg/dL — ABNORMAL HIGH (ref 65–99)
Potassium: 3.6 mmol/L (ref 3.5–5.2)
Sodium: 141 mmol/L (ref 134–144)
TOTAL PROTEIN: 6.8 g/dL (ref 6.0–8.5)

## 2019-01-16 LAB — LIPID PANEL
CHOLESTEROL TOTAL: 138 mg/dL (ref 100–199)
Chol/HDL Ratio: 2.9 ratio (ref 0.0–4.4)
HDL: 48 mg/dL (ref >39)
LDL CALC: 58 mg/dL (ref 0–99)
TRIGLYCERIDES: 162 mg/dL — AB (ref 0–149)
VLDL CHOLESTEROL CAL: 32 mg/dL (ref 5–40)

## 2019-01-16 LAB — FERRITIN: FERRITIN: 33 ng/mL (ref 15–150)

## 2019-01-16 MED ORDER — BLOOD GLUCOSE MONITOR KIT: PACK | 0 refills | Status: DC

## 2019-01-16 NOTE — Assessment & Plan Note (Signed)
Established problem. Stable. Interested in GLP-1 Inhibitor like semaglutide oral  May not be able to get BCBS to cover, though could use manufacturer card to lower initial costs o/w  May recommend initial injectable forms  Needs referral to Diabetes Nutritional Counseling at next visit. I do not believe she been, or if she has, it has been a longtime.

## 2019-01-16 NOTE — Telephone Encounter (Signed)
Asked by Dr. McDiarmid to contact patient and discuss semaglutide options.   We discussed both injection and use of oral (Rybelsus) and injection therapy with Ozempic (we discussed use of sample).    She preferred the oral but is willing to trial the injection while we pursue use of "coupon/discount card" through the manufacturer.    She will come 3/16 at 9:00 AM to initiate injection therapy.

## 2019-01-16 NOTE — Assessment & Plan Note (Signed)
Established problem. Bilateral soft corns on lateral sides of 4th digits persist, though are not inflammed as they have been.   Patient has mechanical issues with her feet that may require surgical intervention or accommodation in her foot ware.  [facility] referred to Podiatry.

## 2019-01-16 NOTE — Assessment & Plan Note (Addendum)
Lab Results  Component Value Date   HGBA1C 7.9 (A) 01/15/2019  Established problem slightly worsened from 7.3%  Will discuss addition of either SGLT-2 inhibitor or GLP1 inhibitor.  Patient is interested in GLP-1 Inhibitor like semaglutide oral  May not be able to get BCBS to cover, though could use manufacturer card to lower initial costs o/w  May recommend initial injectable forms  Needs referral to Diabetes Nutritional Counseling at next visit. I do not believe she been, or if she has, it has been a longtime.

## 2019-01-16 NOTE — Assessment & Plan Note (Signed)
CBC:    Component Value Date/Time   WBC 11.1 (H) 01/15/2019 1702   WBC 10.2 06/18/2016 0940   HGB 11.8 01/15/2019 1702   HCT 36.1 01/15/2019 1702   PLT 232 01/15/2019 1702   MCV 77 (L) 01/15/2019 1702   NEUTROABS 7.4 (H) 01/15/2019 1702   LYMPHSABS 2.5 01/15/2019 1702   MONOABS 774 05/18/2016 0900   EOSABS 0.4 01/15/2019 1702   BASOSABS 0.1 01/15/2019 1702   Lab Results  Component Value Date   FERRITIN 33 01/15/2019    MCV measure since 2010 labs with either low or low normal values.  RBC counts have been in high range several times in past.  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    Never the less, [name] is due for colorectal cancer screening - she said she would like to think about screening colonoscopy before agreeing for now.

## 2019-01-16 NOTE — Assessment & Plan Note (Signed)
New state for patient. Need discuss Vitamin D and calcium supplementation in future.

## 2019-01-16 NOTE — Assessment & Plan Note (Signed)
Established problem Improved.  At goal of LDL < 70. Pt tolerating statin therapy. Continue atorvastatin 40.   Lipid Panel     Component Value Date/Time   CHOL 138 01/15/2019 1702   TRIG 162 (H) 01/15/2019 1702   HDL 48 01/15/2019 1702   CHOLHDL 2.9 01/15/2019 1702   CHOLHDL 3.7 05/15/2016 0952   VLDL 50 (H) 05/15/2016 0952   LDLCALC 58 01/15/2019 1702

## 2019-01-16 NOTE — Telephone Encounter (Signed)
Reviewed and agree with plan to add GLP-1R inhibitor to patient's diabetes regiment.

## 2019-01-16 NOTE — Progress Notes (Signed)
Subjective:    Patient ID: [name], female    DOB: Dec 25, 1965, 53 y.o.   MRN: [id number] [name] is alone Sources of clinical information for visit is/are patient and past medical records. Nursing assessment for this office visit was reviewed with the patient for accuracy and revision.   Previous Report(s) Reviewed: historical medical records  Depression screen [facility] 2/9 10/07/2018  Decreased Interest 0  Down, Depressed, Hopeless 0  PHQ - 2 Score 0  Altered sleeping -  Tired, decreased energy -  Change in appetite -  Feeling bad or failure about yourself  -  Trouble concentrating -  Moving slowly or fidgety/restless -  Suicidal thoughts -  PHQ-9 Score -  Some recent data might be hidden   Fall Risk  04/24/2016 04/27/2014  Falls in the past year? Yes No  Number falls in past yr: 1 -  Injury with Fall? Yes -  Risk Factor Category  High Fall Risk -  Risk for fall due to : History of fall(s);Other (Comment) -  Risk for fall due to: Comment Lt knee buckles, bilateral diabetic foot pain -  Follow up Falls evaluation completed -    History/P.E. limitations: none  Adult vaccines due  Topic Date Due  . TETANUS/TDAP  12/19/2023  . PNEUMOCOCCAL POLYSACCHARIDE VACCINE AGE 59-64 HIGH RISK  Completed    Diabetes Health Maintenance Due  Topic Date Due  . OPHTHALMOLOGY EXAM  11/23/2014  . FOOT EXAM  04/18/2019  . HEMOGLOBIN A1C  07/18/2019    Health Maintenance Due  Topic Date Due  . PAP SMEAR-Modifier  09/05/1988  . OPHTHALMOLOGY EXAM  11/23/2014  . MAMMOGRAM  09/05/2017  . COLONOSCOPY  09/05/2017     Chief Complaint  Patient presents with  . Diabetes  . Foot Pain     HPI  Problem List Items Addressed This Visit      High   CAD, multiple vessel - History MI 02/2016 - S/P Coronary Angioplasty with DES stent placement x 2 (Mid-LAD (02/2016), & Mid-RCA (03/2016)) - Recommended DAPT for one year - S/P Angioplasty ostium of first diagonal (02/2016)  - Bedside  TTE showed resolution of EF% reduction of 40-45% to 55-60%. - Participated in Cardiac Rehab 05/2016 - Denies Chest pain, increase in DOE, palpitations   Relevant Medications   amLODipine (NORVASC) 10 MG tablet   atorvastatin (LIPITOR) 40 MG tablet   carvedilol (COREG) 12.5 MG tablet   hydrochlorothiazide (MICROZIDE) 12.5 MG capsule   Diabetic peripheral neuropathy ([place]) - Onset possibly in 2015 with thigh burning diagnosed as neuropathy, but diagnosed with bilateral nocturnal feet pain in 04/2016.  Inadequate response to Lyrica 150 mg daily and tramadol. 06/2016 trial of duloxetine. Patient started on Gabapentin 300 mg TID as unable to afford Lyrica (No mention duloxetine) in 02/2017  - Pain in feet continue, particularly at night.  Taking gabapentin 300 mg tablet, 3 tablets (900 mg) TID. - Most of the pain is in the toes with postive numbness of "Pins n' Needles" feelings    Relevant Medications   atorvastatin (LIPITOR) 40 MG tablet   metFORMIN (GLUCOPHAGE) 1000 MG tablet   Other Relevant Orders   Lipid panel (Completed)   Hypertensive heart disease with heart failure ([place]) - History of G2DD first diagnosed TTE 03/2016 and still present on 02/13/16 TTE measured right before PCI procedures that restored the patient to normal systolic function.   Disease Monitoring  Checking Blood pressure at home: no, not checking  Chest pain: no   Dyspnea: yes, DOE of usual amount   Claudication: no   Medication compliance: yes, HCTZ, Amlodipine and carvedilol  Medication Side Effects  Lightheadedness: no   Urinary frequency: no   Edema: no   Palpitations: no   Preventitive Healthcare:  Exercise: no, and little physical activity in private and work life.   Diet type: casseroles, meats      Relevant Medications   amLODipine (NORVASC) 10 MG tablet   atorvastatin (LIPITOR) 40 MG tablet   carvedilol (COREG) 12.5 MG tablet   hydrochlorothiazide (MICROZIDE) 12.5 MG capsule   Morbid (severe)  obesity due to excess calories ([place]) - present for many years, found in note from 2006 - Eats one large meal a day, casseroles, meats with veg.  Denies snacking.  - Interested in GLP-1I diabetic meds that may help with weight loss - Has not seen diabetes nutrition counselor   Relevant Medications   metFORMIN (GLUCOPHAGE) 1000 MG tablet   Type 2 diabetes mellitus with other circulatory complications (H[facility]) - Primary (Chronic)  Disease Monitoring  Blood Sugar Ranges: does not have a glucometer  Polyuria: no   Visual problems: no   Medication Compliance: yes, metformnin 1000 mg BID, NPH 25 units BID before meals.   Medication Side Effects  Hypoglycemia: no   Preventitive Health Care             Daily Aspirin: No              Statin: Taking atorvastatin             Dental evaluation in 69-month: no             Recent eGFR: SCr 1.0, eGFR 64             ACEI: subglotal angioedema requring intubation  Eye Exam: no  Foot Exam: yes  Diet pattern: high fat and noncomplex carbohydrates  Exercise: no formal and little activity thru work of private life     Relevant Medications   blood glucose meter kit and supplies KIT   amLODipine (NORVASC) 10 MG tablet   atorvastatin (LIPITOR) 40 MG tablet   carvedilol (COREG) 12.5 MG tablet   hydrochlorothiazide (MICROZIDE) 12.5 MG capsule   metFORMIN (GLUCOPHAGE) 1000 MG tablet   Other Relevant Orders   HgB A1c (Completed)   CMP14+EGFR (Completed)       Low   Hyperlipidemia with target LDL less than 100 HYPERLIPIDEMIA  Disease Monitoring: See symptoms for Hypertension Heart  Medications: Compliance- taking atorvastatin RUQ pain- no  Muscle aches- only focal in neck, aching at work    Relevant Medications   amLODipine (NORVASC) 10 MG tablet   atorvastatin (LIPITOR) 40 MG tablet   carvedilol (COREG) 12.5 MG tablet   hydrochlorothiazide (MICROZIDE) 12.5 MG capsule   Other Relevant Orders   Lipid panel (Completed)   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  - Hotflashes occur day and at night.  Does not wake from sleep   Postmenopausal (Chronic)     Unprioritized   Corn of toe - Referral to podiatry - Patient with diabetic peripheral neuropathy - may need diabetic shoes with inserts.      Microcytic anemia   - Dx by PCP, Dr FRee Kida with iron-deficiency anemia in 05/2016. Patient was having menses at that time, so contributed cause of deficiency to menstruation.  He recommended iron supplement - Dr FRee Kidalisted active problem of IDA  at04/2018 ov. Pt still having menstruations.    CBC was checked   Relevant Orders   CBC with Differential (Completed)   Ferritin (Completed)      SH: smoking status reviewed.  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 High arches bilaterally SKIN: No lesion nor rashes of face  Diabetic Foot Exam - Simple   Simple Foot Form Diabetic Foot exam was performed with the following findings:  Yes 01/15/2019 10:00 AM  Visual Inspection See comments:  Yes Sensation Testing See comments:  Yes Pulse Check Posterior Tibialis and Dorsalis pulse intact bilaterally:  Yes Comments Bilateral soft corns lateral sides 4th digits of feet  Decrease sensation all toes bilaterally.  Intact sensation in mid-soles and heels.      Gait: Normal speed, No significant path deviation, Step through +,  Psych: Normal affect/thought/speech/language    Assessment & Plan:  Visit Problem List with A/P  Morbid (severe) obesity due to  excess calories ([place]) Established problem. Stable. Interested in GLP-1 Inhibitor like semaglutide oral  May not be able to get BCBS to cover, though could use manufacturer card to lower initial costs o/w  May recommend initial injectable forms  Needs referral to Diabetes Nutritional Counseling at next visit. I do not believe she been, or if she has, it has been a longtime.    Microcytic anemia CBC:    Component Value Date/Time   WBC 11.1 (H) 01/15/2019 1702   WBC 10.2 06/18/2016 0940   HGB 11.8 01/15/2019 1702   HCT 36.1 01/15/2019 1702   PLT 232 01/15/2019 1702   MCV 77 (L) 01/15/2019 1702   NEUTROABS 7.4 (H) 01/15/2019 1702   LYMPHSABS 2.5 01/15/2019 1702   MONOABS 774 05/18/2016 0900   EOSABS 0.4 01/15/2019 1702   BASOSABS 0.1 01/15/2019 1702   Lab Results  Component Value Date   FERRITIN 33 01/15/2019    MCV measure since 2010 labs with either low or low normal values.  RBC counts have been in high range several times in past.  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    Never the less, Ms Dhingra is due for colorectal cancer screening - she said she would like to think about screening colonoscopy before agreeing for now.     Corn of toe Established problem. Bilateral soft corns on lateral sides of 4th digits persist, though are not inflammed as they have been.   Patient has mechanical issues with her feet that may require surgical intervention or accommodation in her foot ware.  Ms Health referred to Podiatry.

## 2019-01-16 NOTE — Assessment & Plan Note (Signed)
New issue Per pt, not severe enough to warrant medication.

## 2019-01-19 ENCOUNTER — Telehealth: Payer: Self-pay

## 2019-01-19 ENCOUNTER — Other Ambulatory Visit: Payer: Self-pay

## 2019-01-19 ENCOUNTER — Ambulatory Visit (INDEPENDENT_AMBULATORY_CARE_PROVIDER_SITE_OTHER): Payer: BLUE CROSS/BLUE SHIELD | Admitting: Pharmacist

## 2019-01-19 ENCOUNTER — Encounter: Payer: Self-pay | Admitting: Pharmacist

## 2019-01-19 DIAGNOSIS — E1159 Type 2 diabetes mellitus with other circulatory complications: Secondary | ICD-10-CM

## 2019-01-19 DIAGNOSIS — E785 Hyperlipidemia, unspecified: Secondary | ICD-10-CM | POA: Diagnosis not present

## 2019-01-19 MED ORDER — BAYER CONTOUR MONITOR DEVI: 1.0000 | Freq: Once | 0 refills | Status: AC

## 2019-01-19 MED ORDER — GLUCOSE BLOOD VI STRP: ORAL_STRIP | 12 refills | Status: DC

## 2019-01-19 MED ORDER — SEMAGLUTIDE(0.25 OR 0.5MG/DOS) 2 MG/1.5ML ~~LOC~~ SOPN: 0.2500 mg | PEN_INJECTOR | SUBCUTANEOUS | 0 refills | Status: DC

## 2019-01-19 MED ORDER — SEMAGLUTIDE 3 MG PO TABS: 1.0000 | ORAL_TABLET | Freq: Every day | ORAL | 0 refills | Status: DC

## 2019-01-19 MED ORDER — BAYER MICROLET LANCETS MISC: 12 refills | Status: DC

## 2019-01-19 NOTE — Patient Instructions (Signed)
Nice to meet you today.   Please START Ozempic 0.25mg  WEEKLY for 4 weeks.  Then increase the dose to 0.5 mg WEEKLY.   Let us know if you have significant Nausea or any Vomiting.   We have attached a savings card for your Rybelsus be sure to present this at the pharmacy you are going to.  When you receive the Rybelsus - please store that until you finish the weekly doses via the Pen.  Once we are ready to start the ORAL Rybelsus - start at 3mg  daily THEN after a month we will start 7mg  daily.   Once you start Rybelsus, make sure to take this on an empty stomach with no more than 4 oz (half a glass) of water so this medication can absorb properly.   Don't hesitate to contact us if you have any questions.  Follow-Up with Dr. McDiarmid 4-6 weeks.

## 2019-01-19 NOTE — Progress Notes (Signed)
    S:     Chief Complaint  Patient presents with  . Medication Management    Diabetes    Patient arrives in good spirits and presents for diabetes evaluation, education, and management at the request of Dr. McDiarmid for oral and injectable semaglutide . Patient was referred on 01/16/2019.  Patient was last seen by Primary Care Provider on 01/15/2019.  Patient requested meter and supplies for Diabetes testing.   Patient reports Diabetes was diagnosed in Sep 2013.   Insurance coverage/medication affordability: BCBS [place] Plan  Patient reports adherence with medications.  Current diabetes medications include: Metformin 1000 mg PO BID, Insulin NPH 100 Unit/mL 25 units BID before a meal Current hypertension medications include: amlodipine 10 mg PO once daily, carvedilol 12.5 mg BID with a meal  Patient denies hypoglycemic events.  Patient-reported exercise habits: not exercising outside of work   Patient denies nocturia.  Patient denies neuropathy. Patient denies visual changes. Patient denies self foot exams.    O:  Physical Exam Vitals signs reviewed.    Review of Systems  All other systems reviewed and are negative.    Lab Results  Component Value Date   HGBA1C 7.9 (A) 01/15/2019   Vitals:   01/19/19 0920  BP: 132/72  Pulse: 61  SpO2: 96%    Lipid Panel     Component Value Date/Time   CHOL 138 01/15/2019 1702   TRIG 162 (H) 01/15/2019 1702   HDL 48 01/15/2019 1702   CHOLHDL 2.9 01/15/2019 1702   CHOLHDL 3.7 05/15/2016 0952   VLDL 50 (H) 05/15/2016 0952   LDLCALC 58 01/15/2019 1702     A/P: Diabetes longstanding (Sep 2013) currently uncontrolled. Patient is able to verbalize appropriate hypoglycemia management plan. Patient is adherent with medication. Control is suboptimal and warrants add-on therapy. -Continued insulin NPH (insulin Humulin N, Novolin N) 25 units BID before a meal.  -Started GLP-1 injectable Ozempic (generic name semaglutide)  0.25 mg  weekly for 4 weeks then increase the dose to 0.5 mg weekly. -Once Ozempic pen has run out start GLP-1 oral Rybelsus (generic name semaglutide) 3 mg daily for 4 weeks then increase the dose to 7 mg daily.   Discussed administration technique of Ozempic and titration schedule for the next few months. Acquired and discussed how to use savings card for Rybelsus and educated patient on how to take medication. -Next A1C anticipated 04/17/2019.   ASCVD risk - secondary prevention in patient with DM. Last LDL is controlled with Ldl of 58 in 01/2019.  Patient reports Aspirin therapy was stopped after one year of combination (with Ticagrelor) therapy post-MI  -Continued atorvastatin 40 mg.   Hypertension longstanding since April  2017, currently controlled.  BP goal = 130/80 mmHg. Patient appears adherent with medication.  Written patient instructions provided.  Total time in face to face counseling 38 minutes.    Follow up Dr. McDiarmid 4-6 weeks.   Patient seen with Wilhemina Bonito PharmD Candidate, Danae Orleans, PharmD, PGY-1 resident. and Madelon Lips, PharmD.

## 2019-01-19 NOTE — Assessment & Plan Note (Signed)
ASCVD risk - secondary prevention in patient with DM. Last LDL is controlled with Ldl of 58 in 01/2019.  Patient reports Aspirin therapy was stopped after one year of combination (with Ticagrelor) therapy post-MI  -Continued atorvastatin 40 mg.

## 2019-01-19 NOTE — Telephone Encounter (Signed)
Denied per CoverMyMeds. Alisa Brake, RN ([facility] RN)  

## 2019-01-19 NOTE — Telephone Encounter (Signed)
Received fax from [facility], PA needed on Rybesus.  Clinical questions submitted via Cover My Meds.  Waiting on response, could take up to 72 hours.  Cover My Meds info: Key: [id number]  Ples Specter, RN [facility] RN)

## 2019-01-19 NOTE — Assessment & Plan Note (Signed)
Diabetes longstanding (Sep 2013) currently uncontrolled. Patient is able to verbalize appropriate hypoglycemia management plan. Patient is adherent with medication. Control is suboptimal and warrants add-on therapy. -Continued insulin NPH (insulin Humulin N, Novolin N) 25 units BID before a meal.  -Started GLP-1 injectable Ozempic (generic name semaglutide)  0.25 mg weekly for 4 weeks then increase the dose to 0.5 mg weekly. -Once Ozempic pen has run out start GLP-1 oral Rybelsus (generic name semaglutide) 3 mg daily for 4 weeks then increase the dose to 7 mg daily.   Discussed administration technique of Ozempic and titration schedule for the next few months. Acquired and discussed how to use savings card for Rybelsus and educated patient on how to take medication. -Next A1C anticipated 04/17/2019.

## 2019-01-19 NOTE — Progress Notes (Signed)
Patient ID: [name], female   DOB: 10-23-1966, 53 y.o.   MRN: [id number] Reviewed: Agree with Dr. Macky Lower documentation and management.

## 2019-01-21 ENCOUNTER — Telehealth: Payer: Self-pay

## 2019-01-21 MED ORDER — SEMAGLUTIDE(0.25 OR 0.5MG/DOS) 2 MG/1.5ML ~~LOC~~ SOPN: 0.2500 mg | PEN_INJECTOR | SUBCUTANEOUS | 0 refills | Status: DC

## 2019-01-21 MED ORDER — SEMAGLUTIDE(0.25 OR 0.5MG/DOS) 2 MG/1.5ML ~~LOC~~ SOPN: 0.2500 mg | PEN_INJECTOR | SUBCUTANEOUS | 2 refills | Status: DC

## 2019-01-21 NOTE — Telephone Encounter (Signed)
Semaglutide (Ozampic) injectable sent 2 mg/1.5 ml pen x 1 with 2 RF into patient's pharmacy.  [facility] Blue Team - please contact Ms Odonald to let her know that refills of her Ozempic prescription are available at her pharmacy when her sample pen runs out.

## 2019-01-21 NOTE — Telephone Encounter (Signed)
Received fax from [facility], PA needed on Ozempic injection.  Clinical questions printed and placed in Dr. Raymondo Band box for completion.   Cover My Meds info: Key: [id number]  Nigel Mormon, RN

## 2019-01-21 NOTE — Addendum Note (Signed)
Addended byPerley Jain, TODD D on: 01/21/2019 08:03 AM   Modules accepted: Orders

## 2019-01-21 NOTE — Telephone Encounter (Signed)
LM for patient asking her to callback.  Please inform her that medication is at the pharmacy and she just needs to ask pharmacy to fill it when she is ready.  Rickelle Sylvestre,CMA

## 2019-01-22 ENCOUNTER — Ambulatory Visit (INDEPENDENT_AMBULATORY_CARE_PROVIDER_SITE_OTHER): Payer: BLUE CROSS/BLUE SHIELD | Admitting: Podiatry

## 2019-01-22 ENCOUNTER — Other Ambulatory Visit: Payer: Self-pay

## 2019-01-22 ENCOUNTER — Encounter: Payer: Self-pay | Admitting: Podiatry

## 2019-01-22 DIAGNOSIS — M79674 Pain in right toe(s): Secondary | ICD-10-CM

## 2019-01-22 DIAGNOSIS — L600 Ingrowing nail: Secondary | ICD-10-CM

## 2019-01-22 DIAGNOSIS — L84 Corns and callosities: Secondary | ICD-10-CM

## 2019-01-22 DIAGNOSIS — E1142 Type 2 diabetes mellitus with diabetic polyneuropathy: Secondary | ICD-10-CM | POA: Diagnosis not present

## 2019-01-22 DIAGNOSIS — M79675 Pain in left toe(s): Secondary | ICD-10-CM

## 2019-01-22 DIAGNOSIS — B351 Tinea unguium: Secondary | ICD-10-CM | POA: Diagnosis not present

## 2019-01-22 NOTE — Patient Instructions (Signed)
Diabetes Mellitus and Foot Care Foot care is an important part of your health, especially when you have diabetes. Diabetes may cause you to have problems because of poor blood flow (circulation) to your feet and legs, which can cause your skin to:  Become thinner and drier.  Break more easily.  Heal more slowly.  Peel and crack. You may also have nerve damage (neuropathy) in your legs and feet, causing decreased feeling in them. This means that you may not notice minor injuries to your feet that could lead to more serious problems. Noticing and addressing any potential problems early is the best way to prevent future foot problems. How to care for your feet Foot hygiene  Wash your feet daily with warm water and mild soap. Do not use hot water. Then, pat your feet and the areas between your toes until they are completely dry. Do not soak your feet as this can dry your skin.  Trim your toenails straight across. Do not dig under them or around the cuticle. File the edges of your nails with an emery board or nail file.  Apply a moisturizing lotion or petroleum jelly to the skin on your feet and to dry, brittle toenails. Use lotion that does not contain alcohol and is unscented. Do not apply lotion between your toes. Shoes and socks  Wear clean socks or stockings every day. Make sure they are not too tight. Do not wear knee-high stockings since they may decrease blood flow to your legs.  Wear shoes that fit properly and have enough cushioning. Always look in your shoes before you put them on to be sure there are no objects inside.  To break in new shoes, wear them for just a few hours a day. This prevents injuries on your feet. Wounds, scrapes, corns, and calluses  Check your feet daily for blisters, cuts, bruises, sores, and redness. If you cannot see the bottom of your feet, use a mirror or ask someone for help.  Do not cut corns or calluses or try to remove them with medicine.  If you  find a minor scrape, cut, or break in the skin on your feet, keep it and the skin around it clean and dry. You may clean these areas with mild soap and water. Do not clean the area with peroxide, alcohol, or iodine.  If you have a wound, scrape, corn, or callus on your foot, look at it several times a day to make sure it is healing and not infected. Check for: ? Redness, swelling, or pain. ? Fluid or blood. ? Warmth. ? Pus or a bad smell. General instructions  Do not cross your legs. This may decrease blood flow to your feet.  Do not use heating pads or hot water bottles on your feet. They may burn your skin. If you have lost feeling in your feet or legs, you may not know this is happening until it is too late.  Protect your feet from hot and cold by wearing shoes, such as at the beach or on hot pavement.  Schedule a complete foot exam at least once a year (annually) or more often if you have foot problems. If you have foot problems, report any cuts, sores, or bruises to your health care provider immediately. Contact a health care provider if:  You have a medical condition that increases your risk of infection and you have any cuts, sores, or bruises on your feet.  You have an injury that is not   healing.  You have redness on your legs or feet.  You feel burning or tingling in your legs or feet.  You have pain or cramps in your legs and feet.  Your legs or feet are numb.  Your feet always feel cold.  You have pain around a toenail. Get help right away if:  You have a wound, scrape, corn, or callus on your foot and: ? You have pain, swelling, or redness that gets worse. ? You have fluid or blood coming from the wound, scrape, corn, or callus. ? Your wound, scrape, corn, or callus feels warm to the touch. ? You have pus or a bad smell coming from the wound, scrape, corn, or callus. ? You have a fever. ? You have a red line going up your leg. Summary  Check your feet every day  for cuts, sores, red spots, swelling, and blisters.  Moisturize feet and legs daily.  Wear shoes that fit properly and have enough cushioning.  If you have foot problems, report any cuts, sores, or bruises to your health care provider immediately.  Schedule a complete foot exam at least once a year (annually) or more often if you have foot problems. This information is not intended to replace advice given to you by your health care provider. Make sure you discuss any questions you have with your health care provider. Document Released: 10/19/2000 Document Revised: 12/04/2017 Document Reviewed: 11/23/2016 Elsevier Interactive Patient Education  2019 [employer] Inc[employer]  Onychomycosis/Fungal Toenails  WHAT IS IT? An infection that lies within the keratin of your nail plate that is caused by a fungus.  WHY ME? Fungal infections affect all ages, sexes, races, and creeds.  There may be many factors that predispose you to a fungal infection such as age, coexisting medical conditions such as diabetes, or an autoimmune disease; stress, medications, fatigue, genetics, etc.  Bottom line: fungus thrives in a warm, moist environment and your shoes offer such a location.  IS IT CONTAGIOUS? Theoretically, yes.  You do not want to share shoes, nail clippers or files with someone who has fungal toenails.  Walking around barefoot in the same room or sleeping in the same bed is unlikely to transfer the organism.  It is important to realize, however, that fungus can spread easily from one nail to the next on the same foot.  HOW DO WE TREAT THIS?  There are several ways to treat this condition.  Treatment may depend on many factors such as age, medications, pregnancy, liver and kidney conditions, etc.  It is best to ask your doctor which options are available to you.  1. No treatment.   Unlike many other medical concerns, you can live with this condition.  However for many people this can be a painful condition and  may lead to ingrown toenails or a bacterial infection.  It is recommended that you keep the nails cut short to help reduce the amount of fungal nail. 2. Topical treatment.  These range from herbal remedies to prescription strength nail lacquers.  About 40-50% effective, topicals require twice daily application for approximately 9 to 12 months or until an entirely new nail has grown out.  The most effective topicals are medical grade medications available through physicians offices. 3. Oral antifungal medications.  With an 80-90% cure rate, the most common oral medication requires 3 to 4 months of therapy and stays in your system for a year as the new nail grows out.  Oral antifungal medications do require   blood work to make sure it is a safe drug for you.  A liver function panel will be performed prior to starting the medication and after the first month of treatment.  It is important to have the blood work performed to avoid any harmful side effects.  In general, this medication safe but blood work is required. 4. Laser Therapy.  This treatment is performed by applying a specialized laser to the affected nail plate.  This therapy is noninvasive, fast, and non-painful.  It is not covered by insurance and is therefore, out of pocket.  The results have been very good with a 80-95% cure rate.  The [facility] is the only practice in the area to offer this therapy. 5. Permanent Nail Avulsion.  Removing the entire nail so that a new nail will not grow back. 

## 2019-01-29 ENCOUNTER — Encounter: Payer: Self-pay | Admitting: Podiatry

## 2019-01-29 NOTE — Progress Notes (Signed)
Subjective: [name] presents today referred by McDiarmid, Blane Ohara, MD with history of diabetes and diabetic neuropathy. She presents with cc of painful medial 4th toe  right foot. Pain has been present for the past 6 months.  She has tried corn remover prescribed and followed by her PCP. It did provide some relief, but area is still sore.  She also relates discolored, thick toenails which interfere with daily activities.  Pain is aggravated when wearing enclosed shoe gear. Pain is relieved with periodic professional debridement.  She does have neuropathy and describes it as pins/needles sensation. She takes gabapentin for her symptoms.  She admits she still smokes cigarettes on occasion.  Admits to myocardial infarction about 2 years ago.  Last date seen by PCP 01/15/2019.  Past Medical History:  Diagnosis Date  . Abnormal bruising 06/18/2016  . ARF (acute respiratory failure) ([place]) 02/10/2016  . CAD (coronary artery disease) 02/10/2016   a. 03/14/2016 Staged PCI with DES to mid RCA. b. NSTEMI 02/2016: DES to mid-LAD, POBA to 1st Diag, 85% stenosis of RCA noted with staged PCI recommended  . Chronic combined systolic (congestive) and diastolic (congestive) heart failure ([place])    a. Echo 02/2016: EF 40-45% w/ Grade 2 DD. 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  Marland Kitchen Dysphagia 03/09/2016   Referred to [facility] [place] 03/09/2016    . Dyspnea 03/09/2016   Off ACEi since 02/19/16 -Spirometry 03/09/2016  No airflow obst     . Fall 03/01/2016  . Flash pulmonary edema ([place]) 02/10/2016   requiring intubation  . History of MRSA infection 11/05/2010   Skin abscesses  . History of myocardial infarction 02/04/2016  . Hypertension   . Hypokalemia 03/08/2017  . Leukocytosis 05/21/2016  . Meralgia paresthetica of right side 12/18/2013  . Microcytic anemia 05/18/2016  . Myocardial infarction ([place]) 02/2016  . NSTEMI (non-ST elevated myocardial infarction) ([place])   . Obesity (BMI 30-39.9) 04/03/2016   . Orthostatic hypotension 04/27/2014  . OSA (obstructive sleep apnea)   . OSA on CPAP   . Required emergent intubation 02/10/2016  . Stridor 02/19/2016  . Type II diabetes mellitus [facility])     Patient Active Problem List   Diagnosis Date Noted  . Postmenopausal 01/16/2019  . Menopausal vasomotor syndrome 01/16/2019  . Insulin-requiring or dependent type II diabetes mellitus ([place]) 01/16/2019  . Allergy to ACE inhibitors 01/16/2019  . Corn of toe 04/17/2018  . OSA on CPAP   . Depression 06/28/2016  . Microcytic anemia 05/18/2016  . Healthcare maintenance 04/19/2016  . Diabetic peripheral neuropathy ([place]) 04/19/2016  . Restrictive pattern present on pulmonary function testing 03/09/2016  . Constipation 03/07/2016  . CAD, multiple vessel 02/20/2016  . Hypertensive heart disease with heart failure ([place])   . GERD (gastroesophageal reflux disease) 12/03/2013  . Degenerative disk disease 08/12/2012  . Hyperlipidemia with target LDL less than 100 08/11/2012  . Essential hypertension 07/26/2012  . Type 2 diabetes mellitus with other circulatory complications ([place]) 60/45/4098  . Morbid (severe) obesity due to excess calories ([place]) 11/05/2004    Past Surgical History:  Procedure Laterality Date  . ANTERIOR CERVICAL DECOMP/DISCECTOMY FUSION  08/28/2012   Procedure: ANTERIOR CERVICAL DECOMPRESSION/DISCECTOMY FUSION 2 LEVELS;  Surgeon: Otilio Connors, MD;  Location: [facility];  Service: Neurosurgery;  Laterality: N/A;  Cervical four-five, cervical six-seven Anterior cervical decompression/diskectomy/fusion/Life[employer] Bone/Trestle plate  . BACK SURGERY    . BREAST CYST EXCISION Right   . CARDIAC CATHETERIZATION N/A 02/15/2016  Procedure: Left Heart Cath and Coronary Angiography;  Surgeon: Wellington Hampshire, MD;  Location: [facility];  Service: Cardiovascular;  Laterality: N/A;  . CARDIAC CATHETERIZATION N/A 02/15/2016   Procedure: Coronary Stent Intervention;  Surgeon: Wellington Hampshire,  MD;  Location: [place] [facility];  Service: Cardiovascular;  Laterality: N/A;  . CARDIAC CATHETERIZATION N/A 02/15/2016   Procedure: Coronary Balloon Angioplasty;  Surgeon: Wellington Hampshire, MD;  Location: [place];  Service: Cardiovascular;  Laterality: N/A;  . CARDIAC CATHETERIZATION N/A 03/14/2016   Procedure: Coronary Stent Intervention;  Surgeon: Wellington Hampshire, MD;  Location: [facility];  Service: Cardiovascular;  Laterality: N/A;  . CORONARY ANGIOPLASTY WITH STENT PLACEMENT    . SPLIT NIGHT STUDY  04/07/2016     Current Outpatient Medications:  .  amLODipine (NORVASC) 10 MG tablet, Take 1 tablet (10 mg total) by mouth daily., Disp: 60 tablet, Rfl: 5 .  atorvastatin (LIPITOR) 40 MG tablet, TAKE 1 TABLET BY MOUTH ONCE DAILY, Disp: 30 tablet, Rfl: 11 .  Bayer Microlet Lancets lancets, Use as instructed, Disp: 100 each, Rfl: 12 .  blood glucose meter kit and supplies KIT, Check sugars three times a day (Patient not taking: Reported on 01/19/2019), Disp: 1 each, Rfl: 0 .  carvedilol (COREG) 12.5 MG tablet, Take 1 tablet (12.5 mg total) by mouth 2 (two) times daily with a meal., Disp: 180 tablet, Rfl: 3 .  gabapentin (NEURONTIN) 300 MG capsule, TAKE 3 CAPSULES BY MOUTH THREE TIMES DAILY, Disp: 270 capsule, Rfl: 3 .  glucose blood test strip, Use as instructed, Disp: 100 each, Rfl: 12 .  hydrochlorothiazide (MICROZIDE) 12.5 MG capsule, Take 1 capsule (12.5 mg total) by mouth daily., Disp: 90 capsule, Rfl: 3 .  insulin NPH Human (HUMULIN N,NOVOLIN N) 100 UNIT/ML injection, Inject 0.25 mLs (25 Units total) into the skin 2 (two) times daily before a meal., Disp: 10 mL, Rfl: 11 .  INSULIN SYRINGE 1CC/29G 29G X 1/2" 1 ML MISC, Use to inject insulin daily., Disp: 100 each, Rfl: 1 .  metFORMIN (GLUCOPHAGE) 1000 MG tablet, TAKE 1 TABLET BY MOUTH TWICE DAILY WITH A MEAL, Disp: 180 tablet, Rfl: 3 .  polyethylene glycol (MIRALAX / GLYCOLAX) packet, Take 17 g by mouth as needed for mild  constipation or moderate constipation. , Disp: , Rfl:  .  Semaglutide,0.25 or 0.5MG/DOS, (OZEMPIC, 0.25 OR 0.5 MG/DOSE,) 2 MG/1.5ML SOPN, Inject 0.25-0.5 mg into the skin once a week. Start 0.5 mg injections once completed 4 weeks 0.25 mg injections., Disp: 1 pen, Rfl: 2 .  Semaglutide,0.25 or 0.5MG/DOS, (OZEMPIC, 0.25 OR 0.5 MG/DOSE,) 2 MG/1.5ML SOPN, Inject 0.25-0.5 mg into the skin once a week., Disp: 1 pen, Rfl: 0  Allergies  Allergen Reactions  . Ace Inhibitors Swelling    Subglottic edema requiring intubation for respiratory failure   Social History   Occupational History  . Not on file  Tobacco Use  . Smoking status: Current Some Day Smoker    Types: Cigarettes  . Smokeless tobacco: Never Used  Substance and Sexual Activity  . Alcohol use: Yes    Comment: 03/14/2016 "I'll have a couple drinks on major holidays"  . Drug use: Yes    Types: Marijuana    Comment: 03/14/2016 "last use was in 02/2016"  . Sexual activity: Not Currently    Birth control/protection: None    Family History  Problem Relation Age of Onset  . Heart disease Father 72  MI  . Heart disease Paternal Grandmother 10  . Diabetes Paternal Grandmother   . Hypertension Paternal Grandmother   . Heart disease Paternal Aunt   . Cancer Neg Hx   . Stroke Neg Hx   . Alcohol abuse Neg Hx     Immunization History  Administered Date(s) Administered  . Influenza,inj,Quad PF,6+ Mos 12/18/2013  . Pneumococcal Polysaccharide-23 08/29/2012, 11/19/2013  . Tdap 12/18/2013    Review of systems: Positive Findings in bold print.  Constitutional:  chills, fatigue, fever, sweats, weight change Communication: Optometrist, sign Ecologist, hand writing, iPad/Android device Head: headaches, head injury Eyes: changes in vision, eye pain, glaucoma, cataracts, macular degeneration, diplopia, glare,  light sensitivity, eyeglasses or contacts, blindness Ears nose mouth throat: hearing impaired, hearing aids,   ringing in ears, deaf, sign language,  vertigo,   nosebleeds,  rhinitis,  cold sores, snoring, swollen glands Cardiovascular: HTN, edema, arrhythmia, pacemaker in place, defibrillator in place, chest pain/tightness, chronic anticoagulation, blood clot, heart failure, MI Peripheral Vascular: leg cramps, varicose veins, blood clots, lymphedema, varicosities Respiratory:  difficulty breathing, denies congestion, SOB, wheezing, cough, emphysema Gastrointestinal: change in appetite or weight, abdominal pain, constipation, diarrhea, nausea, vomiting, vomiting blood, change in bowel habits, abdominal pain, jaundice, rectal bleeding, hemorrhoids, GERD Genitourinary:  nocturia,  pain on urination, polyuria,  blood in urine, Foley catheter, urinary urgency, ESRD on hemodialysis Musculoskeletal: amputation, cramping, stiff joints, painful joints, decreased joint motion, fractures, OA, gout, hemiplegia, paraplegia, uses cane, wheelchair bound, uses walker, uses rollator Skin: +changes in toenails, color change, dryness, itching, mole changes,  rash, wound(s), corns/calluses Neurological: headaches, numbness in feet, paresthesias in feet, burning in feet, fainting,  seizures, change in speech. denies headaches, memory problems/poor historian, cerebral palsy, weakness, paralysis, CVA, TIA Endocrine: diabetes, hypothyroidism, hyperthyroidism,  goiter, dry mouth, flushing, heat intolerance,  cold intolerance,  excessive thirst, denies polyuria,  nocturia Hematological:  easy bleeding, excessive bleeding, easy bruising, enlarged lymph nodes, on long term blood thinner, history of past transusions Allergy/immunological:  hives, eczema, frequent infections, multiple drug allergies, seasonal allergies, transplant recipient, multiple food allergies Psychiatric:  anxiety, depression, mood disorder, suicidal ideations, hallucinations, insomnia  Objective: Vascular Examination: Capillary refill time immediate  x 10  digits  Dorsalis pedis pulses palpable b/l.  Posterior tibial pulses palpable b/l.  Digital hair absent x 10 digits  Skin temperature gradient WNL b/l  Dermatological Examination: Skin with normal turgor, texture and tone b/l  Toenails 1-5 b/l discolored, thick, dystrophic with subungual debris and pain with palpation to nailbeds due to thickness of nails.Incurvated nailplate b/l great toes with tenderness to palpation. No erythema, no edema, no drainage noted.  Hyperkeratotic lesion lateral aspect b/l 4th digits with tenderness to palpation. No erythema, no edema, no drainage, no flocculence noted.  Musculoskeletal: Muscle strength 5/5 to all LE muscle groups  Neurological: Sensation decreased with 10 gram monofilament b/l. Vibratory sensation diminished b/l.  Assessment: 1. Painful onychomycosis toenails 1-5 b/l  2. Interdigital corn b/l 4th digit 3. Ingrown toenails b/l great toes, noninfected 4. NIDDM with neuropathy  Plan: 1. Discussed diabetic foot care principles. Literature dispensed on today. 2. Toenails 1-5 b/l were debrided in length and girth without iatrogenic bleeding.Offending nail borders debrided and curretaged b/l great toes. Borders cleansed with alcohol. Antibiotic ointment applied. No further treatment required by patient. Discussed aggressive toenail debridement with patient to avoid development of ingrown toenails. She related understanding. 3. Corn(s) pared right 4th digit b/l utilizing sterile scalpel blade. Dispensed Silipos toe separators for patient  to wear daily when wearing shoe gear. Discouraged use of OTC medicated corn/callus remover without professional management. She related understanding. 4. Patient to continue soft, supportive shoe gear 5. Patient to report any pedal injuries to medical professional immediately. 6. Follow up 3 months.  7. Patient/POA to call should there be a concern in the interim.

## 2019-02-02 NOTE — Telephone Encounter (Signed)
We will pursue daily GLP - Victoza as this is covered on her formulary.

## 2019-02-02 NOTE — Telephone Encounter (Signed)
Dr. Raymondo Band, this document is no longer in your box.  Were you still planning on pursuing?

## 2019-02-03 ENCOUNTER — Other Ambulatory Visit: Payer: Self-pay | Admitting: Family Medicine

## 2019-02-03 MED ORDER — LIRAGLUTIDE 18 MG/3ML ~~LOC~~ SOPN: 1.2000 mg | PEN_INJECTOR | Freq: Every day | SUBCUTANEOUS | 3 refills | Status: DC

## 2019-02-03 NOTE — Addendum Note (Signed)
Addended by: Kathrin Ruddy on: 02/03/2019 03:46 PM   Modules accepted: Orders

## 2019-02-03 NOTE — Telephone Encounter (Signed)
Dr Raymondo Band - Do I need to send in a prescription for the liraglutide to pt's pharmacy?    Acquanetta Belling, MD

## 2019-02-03 NOTE — Telephone Encounter (Signed)
Left message RE Victoza HIPAA compliant asking patient to start at 0.6mg  daily for 1 week then increase to 1.2mg  daily.  New Rx sent to pharmacy.

## 2019-02-05 ENCOUNTER — Telehealth: Payer: Self-pay | Admitting: *Deleted

## 2019-02-05 NOTE — Telephone Encounter (Signed)
Received fax from pharmacy, PA needed on Victoza.  Clinical questions submitted via Cover My Meds.  Med approved and pharmacy informed.  Cover My Meds info: Key: [id number]   Jone Baseman, CMA

## 2019-02-05 NOTE — Telephone Encounter (Signed)
Reviewed

## 2019-02-19 ENCOUNTER — Ambulatory Visit: Payer: BLUE CROSS/BLUE SHIELD | Admitting: Family Medicine

## 2019-04-07 ENCOUNTER — Ambulatory Visit (INDEPENDENT_AMBULATORY_CARE_PROVIDER_SITE_OTHER): Payer: BC Managed Care – PPO

## 2019-04-07 ENCOUNTER — Telehealth: Payer: Self-pay | Admitting: Family Medicine

## 2019-04-07 ENCOUNTER — Ambulatory Visit (INDEPENDENT_AMBULATORY_CARE_PROVIDER_SITE_OTHER): Payer: BC Managed Care – PPO | Admitting: Podiatry

## 2019-04-07 ENCOUNTER — Encounter: Payer: Self-pay | Admitting: Podiatry

## 2019-04-07 ENCOUNTER — Other Ambulatory Visit: Payer: Self-pay

## 2019-04-07 VITALS — Temp 98.1°F

## 2019-04-07 DIAGNOSIS — L97511 Non-pressure chronic ulcer of other part of right foot limited to breakdown of skin: Secondary | ICD-10-CM

## 2019-04-07 DIAGNOSIS — B351 Tinea unguium: Secondary | ICD-10-CM | POA: Diagnosis not present

## 2019-04-07 DIAGNOSIS — E08621 Diabetes mellitus due to underlying condition with foot ulcer: Secondary | ICD-10-CM | POA: Diagnosis not present

## 2019-04-07 DIAGNOSIS — E1142 Type 2 diabetes mellitus with diabetic polyneuropathy: Secondary | ICD-10-CM | POA: Diagnosis not present

## 2019-04-07 MED ORDER — AMOXICILLIN 500 MG PO CAPS: 500.0000 mg | ORAL_CAPSULE | Freq: Three times a day (TID) | ORAL | 0 refills | Status: AC

## 2019-04-07 NOTE — Patient Instructions (Addendum)
DRESSING CHANGES RIGHT FOOT:  WEAR SURGICAL SHOE AT ALL TIMES    1. KEEP RIGHT  FOOT DRY AT ALL TIMES!!!!  2. CLEANSE ULCER WITH SALINE.  3. DAB DRY WITH GAUZE SPONGE.  4. APPLY A LIGHT AMOUNT OF IODOSORB GEL TO BASE OF ULCER.  5. APPLY OUTER DRESSING/BAND-AID AS INSTRUCTED.  6. WEAR SURGICAL SHOE DAILY AT ALL TIMES.  7. DO NOT WALK BAREFOOT!!!  8.  IF YOU EXPERIENCE ANY FEVER, CHILLS, NIGHTSWEATS, NAUSEA OR VOMITING, ELEVATED OR LOW BLOOD SUGARS, REPORT TO EMERGENCY ROOM.  9. IF YOU EXPERIENCE INCREASED REDNESS, PAIN, SWELLING, DISCOLORATION, ODOR, PUS, DRAINAGE OR WARMTH OF YOUR FOOT, REPORT TO EMERGENCY ROOM.  Diabetes Mellitus and Foot Care Foot care is an important part of your health, especially when you have diabetes. Diabetes may cause you to have problems because of poor blood flow (circulation) to your feet and legs, which can cause your skin to:  Become thinner and drier.  Break more easily.  Heal more slowly.  Peel and crack. You may also have nerve damage (neuropathy) in your legs and feet, causing decreased feeling in them. This means that you may not notice minor injuries to your feet that could lead to more serious problems. Noticing and addressing any potential problems early is the best way to prevent future foot problems. How to care for your feet Foot hygiene  Wash your feet daily with warm water and mild soap. Do not use hot water. Then, pat your feet and the areas between your toes until they are completely dry. Do not soak your feet as this can dry your skin.  Trim your toenails straight across. Do not dig under them or around the cuticle. File the edges of your nails with an emery board or nail file.  Apply a moisturizing lotion or petroleum jelly to the skin on your feet and to dry, brittle toenails. Use lotion that does not contain alcohol and is unscented. Do not apply lotion between your toes. Shoes and socks  Wear clean socks or stockings every  day. Make sure they are not too tight. Do not wear knee-high stockings since they may decrease blood flow to your legs.  Wear shoes that fit properly and have enough cushioning. Always look in your shoes before you put them on to be sure there are no objects inside.  To break in new shoes, wear them for just a few hours a day. This prevents injuries on your feet. Wounds, scrapes, corns, and calluses  Check your feet daily for blisters, cuts, bruises, sores, and redness. If you cannot see the bottom of your feet, use a mirror or ask someone for help.  Do not cut corns or calluses or try to remove them with medicine.  If you find a minor scrape, cut, or break in the skin on your feet, keep it and the skin around it clean and dry. You may clean these areas with mild soap and water. Do not clean the area with peroxide, alcohol, or iodine.  If you have a wound, scrape, corn, or callus on your foot, look at it several times a day to make sure it is healing and not infected. Check for: ? Redness, swelling, or pain. ? Fluid or blood. ? Warmth. ? Pus or a bad smell. General instructions  Do not cross your legs. This may decrease blood flow to your feet.  Do not use heating pads or hot water bottles on your feet. They may burn your skin.  If you have lost feeling in your feet or legs, you may not know this is happening until it is too late.  Protect your feet from hot and cold by wearing shoes, such as at the beach or on hot pavement.  Schedule a complete foot exam at least once a year (annually) or more often if you have foot problems. If you have foot problems, report any cuts, sores, or bruises to your health care provider immediately. Contact a health care provider if:  You have a medical condition that increases your risk of infection and you have any cuts, sores, or bruises on your feet.  You have an injury that is not healing.  You have redness on your legs or feet.  You feel burning  or tingling in your legs or feet.  You have pain or cramps in your legs and feet.  Your legs or feet are numb.  Your feet always feel cold.  You have pain around a toenail. Get help right away if:  You have a wound, scrape, corn, or callus on your foot and: ? You have pain, swelling, or redness that gets worse. ? You have fluid or blood coming from the wound, scrape, corn, or callus. ? Your wound, scrape, corn, or callus feels warm to the touch. ? You have pus or a bad smell coming from the wound, scrape, corn, or callus. ? You have a fever. ? You have a red line going up your leg. Summary  Check your feet every day for cuts, sores, red spots, swelling, and blisters.  Moisturize feet and legs daily.  Wear shoes that fit properly and have enough cushioning.  If you have foot problems, report any cuts, sores, or bruises to your health care provider immediately.  Schedule a complete foot exam at least once a year (annually) or more often if you have foot problems. This information is not intended to replace advice given to you by your health care provider. Make sure you discuss any questions you have with your health care provider. Document Released: 10/19/2000 Document Revised: 12/04/2017 Document Reviewed: 11/23/2016 Elsevier Interactive Patient Education  2019 [employer].

## 2019-04-07 NOTE — Telephone Encounter (Signed)
Patient given a pack on algisite.  Advised her that we don't keep a huge stock of this in the office and that she can take the wrapper to a medical supply store to order from them. [name],CMA

## 2019-04-07 NOTE — Telephone Encounter (Signed)
Patient came by earlier but had to leave for another appointment.  She needs some more of the cotton that goes between her toes that she was given last time/visit.  Can we find some for her to give to her asap today as she will stop back by later?

## 2019-04-11 NOTE — Progress Notes (Signed)
Subjective: Patient presents today with diabetes for preventative foot care.   Today, she relates pain of interdigital corn right 4th digit. She says she did not apply the OTC corn remover as I instructed on last visit. She has been showering daily and getting the toe wet.  She has not been using the toe separator.   She also c/o elongated, discolored, thick toenails.   McDiarmid, Blane Ohara, MD is her PCP and last visit was 01/15/2019.  She denies any fever, chills, nightsweats, nausea or vomiting.   Allergies  Allergen Reactions  . Ace Inhibitors Swelling    Subglottic edema requiring intubation for respiratory failure     Objective: Vitals:   04/07/19 1514  Temp: 98.1 F (36.7 C)    Vascular Examination: Capillary refill time immediate x 10 digits.  Dorsalis pedis pulses palpable b/l.  Posterior tibial pulses palpable b/l.  Digital hair absent x 10 digits.  Skin temperature gradient WNL b/l.  Dermatological Examination: Skin with normal turgor, texture and tone b/l.  Toenails 1-5 b/l discolored, thick, dystrophic with subungual debris and pain with palpation to nailbeds due to thickness of nails.  Ulceration located lateral 4th digit PIPJ right foot.  Predebridement measurements carried out today of 1.0 x 1.0 x 0.1 cm. Mild edema, mild erythema and odor noted. No lymphangitis noted.  No probing to bone, no undermining, no active pus or purulence noted.  Postdebridement measurements today are: 1.0 x 1.0 x 0.2 cm. Mild edema, mild erythema and odor noted. No lymphangitis noted. No probing to bone, no undermining, no active pus or purulence noted.  Musculoskeletal: Muscle strength 5/5 to all LE muscle groups.  Neurological: Sensation decreased with 10 gram monofilament.  Vibratory sensation diminished b/l.   Xray right foot: Distal phalanx of right 5th digit abutting PIPJ of right 4th digit contributing to wound formation. No gas in tissues. +Plantar and posterior  heel spurring noted. No bone erosion noted.  Assessment: 1. Painful onychomycosis toenails 1-5 b/l 2. Diabetic Ulceration right 4th toe 4.   NIDDM with peripheral neuropathy  Plan: 1. Toenails 1-5 b/l were debrided in length and girth without iatrogenic bleeding. 2. Ulcer was debrided and reactive hyperkeratoses and necrotic tissue was resected to the level of bleeding or viable tissue. Ulcer was cleansed with wound cleanser. Iodosorb Gel was applied to base of wound with light dressing. 3. Rx sent to pharmacy for Amoxicillin 500 mg po tid x 10 days. 4. Xray right foot was performed and reviewed with patient. 5. Patient was given instructions on offloading and dressing change/aftercare and was instructed to call immediately if any signs or symptoms of infection arise. She is to cleanse right 4th digit with saline and apply Iodosorb Gel to base of wound and cover with light dressing once daily. Keep right foot dry at all times. 6. Patient instructed to report to emergency department with worsening appearance of ulcer/toe/foot, increased pain, foul odor, increased redness, swelling, drainage, fever, chills, nightsweats, nausea, vomiting, increased blood sugar.  7. Patient/POA related understanding. 8. We discussed the need for probable surgical correction of bony deformity contributing to ulcer formation.  Follow up 10 days with Dr. March Rummage for ulcer. 9. Follow up with me in 3 months for diabetic foot care. 10. Patient/POA to call should there be a concern in the interim.

## 2019-04-16 ENCOUNTER — Ambulatory Visit: Payer: BC Managed Care – PPO | Admitting: Family Medicine

## 2019-04-17 ENCOUNTER — Ambulatory Visit (INDEPENDENT_AMBULATORY_CARE_PROVIDER_SITE_OTHER): Payer: BC Managed Care – PPO | Admitting: Podiatry

## 2019-04-17 ENCOUNTER — Other Ambulatory Visit: Payer: Self-pay

## 2019-04-17 VITALS — Temp 97.3°F

## 2019-04-17 DIAGNOSIS — M2041 Other hammer toe(s) (acquired), right foot: Secondary | ICD-10-CM

## 2019-04-17 DIAGNOSIS — L97511 Non-pressure chronic ulcer of other part of right foot limited to breakdown of skin: Secondary | ICD-10-CM | POA: Diagnosis not present

## 2019-04-17 DIAGNOSIS — E08621 Diabetes mellitus due to underlying condition with foot ulcer: Secondary | ICD-10-CM

## 2019-04-23 ENCOUNTER — Encounter: Payer: Self-pay | Admitting: Family Medicine

## 2019-04-23 ENCOUNTER — Ambulatory Visit: Payer: BLUE CROSS/BLUE SHIELD | Admitting: Podiatry

## 2019-04-23 ENCOUNTER — Other Ambulatory Visit: Payer: Self-pay

## 2019-04-23 ENCOUNTER — Ambulatory Visit (INDEPENDENT_AMBULATORY_CARE_PROVIDER_SITE_OTHER): Payer: BC Managed Care – PPO | Admitting: Family Medicine

## 2019-04-23 VITALS — BP 124/68 | HR 64

## 2019-04-23 DIAGNOSIS — D509 Iron deficiency anemia, unspecified: Secondary | ICD-10-CM | POA: Diagnosis not present

## 2019-04-23 DIAGNOSIS — Z1239 Encounter for other screening for malignant neoplasm of breast: Secondary | ICD-10-CM

## 2019-04-23 DIAGNOSIS — I10 Essential (primary) hypertension: Secondary | ICD-10-CM

## 2019-04-23 DIAGNOSIS — E1159 Type 2 diabetes mellitus with other circulatory complications: Secondary | ICD-10-CM

## 2019-04-23 LAB — POCT GLYCOSYLATED HEMOGLOBIN (HGB A1C): HbA1c, POC (controlled diabetic range): 7.6 % — AB (ref 0.0–7.0)

## 2019-04-23 MED ORDER — LIRAGLUTIDE 18 MG/3ML ~~LOC~~ SOPN: 1.2000 mg | PEN_INJECTOR | Freq: Every day | SUBCUTANEOUS | 3 refills | Status: DC

## 2019-04-23 MED ORDER — BAYER CONTOUR MONITOR DEVI: 1.0000 | Freq: Three times a day (TID) | 99 refills | Status: DC

## 2019-04-23 NOTE — Patient Instructions (Addendum)
Your A1c measure of how well your blood sugar 7.6%.   This is down from your A1c of 7.9% last March and even more improved from your A1c of 10.7% in March of last year.   Great Job!!    Pick up your Victoza injection diabetes medication at the pharmacy.  A prescription for a glucose meter that we believe is covered by [employer] was sent into your pharmacy. Strips were also sent in.   Your blood pressure is well controlled.  Keep taking your blood pressure medications.  :)  After your foot is has healed, we will talk about getting your colonoscopy to screen for colon cancer and a Pap smear to screen for cervical cancer.   If you would prefer a female physician to perform you Pap smear, we can arrange that.   An order for a mammogram was sent to [facility].  They will contact you to set up a time for your mammogram that is convenient for you.

## 2019-04-24 ENCOUNTER — Encounter: Payer: Self-pay | Admitting: Family Medicine

## 2019-04-24 NOTE — Progress Notes (Signed)
Subjective:    Patient ID: [name], female    DOB: Sep 15, 1966, 53 y.o.   MRN: [id number] [name] is alone Sources of clinical information for visit is/are patient. Nursing assessment for this office visit was reviewed with the patient for accuracy and revision.   Previous Report(s) Reviewed: historical medical records  Depression screen [facility] 2/9 10/07/2018  Decreased Interest 0  Down, Depressed, Hopeless 0  PHQ - 2 Score 0  Altered sleeping -  Tired, decreased energy -  Change in appetite -  Feeling bad or failure about yourself  -  Trouble concentrating -  Moving slowly or fidgety/restless -  Suicidal thoughts -  PHQ-9 Score -  Some recent data might be hidden   Fall Risk  04/24/2016 04/27/2014  Falls in the past year? Yes No  Number falls in past yr: 1 -  Injury with Fall? Yes -  Risk Factor Category  High Fall Risk -  Risk for fall due to : History of fall(s);Other (Comment) -  Risk for fall due to: Comment Lt knee buckles, bilateral diabetic foot pain -  Follow up Falls evaluation completed -    History/P.E. limitations: none  Adult vaccines due  Topic Date Due  . TETANUS/TDAP  12/19/2023  . PNEUMOCOCCAL POLYSACCHARIDE VACCINE AGE 8-64 HIGH RISK  Completed    Diabetes Health Maintenance Due  Topic Date Due  . OPHTHALMOLOGY EXAM  11/23/2014  . HEMOGLOBIN A1C  10/23/2019  . FOOT EXAM  01/15/2020    Health Maintenance Due  Topic Date Due  . PAP SMEAR-Modifier  09/05/1988  . OPHTHALMOLOGY EXAM  11/23/2014  . MAMMOGRAM  09/05/2017  . COLONOSCOPY  09/05/2017     Chief Complaint  Patient presents with  . Diabetes     HPI  CHRONIC DIABETES  Disease Monitoring  Blood Sugar Ranges: Unable to afford glucometer  Polyuria: no   Visual problems: no   Recent Medication Changes:  yes, Addition of  Victoza 01/19/19 at [facility] with Dr Raymondo Band .   Medication Regiment Adherence: Patient stopped Victoza after first saqmplepen ran out.  She did not  realize that she had been approved for manufacturer discount.  She liked the Victoza bc it gave her early satiety.                                           Medication Side Effects  Hypoglycemia: no   Diet Pattern (Number & Time of meals): twice a day  Recent Physical Illness: no   Emotional Stressors: No  Level of Activity: work; Fish farm manager Care                          Taking Prescribed Statin: yes             Taking Prescribed ACEI/ARB: no                        Last Eye Exam: OK  Complications - right foot corn being treated by [facility].  Pt reports plan for surgical correction toes involved in corns in A1c <7.9%.    Review of Systems No fever No cough    Objective:   Physical Exam VS reviewed GEN: Alert, Cooperative, Groomed, NAD HEENT: PERRL; EAC bilaterally not occluded, TM's translucent with  normal LM, (+) LR;                No cervical LAN, No thyromegaly, No palpable masses Gait: Normal speed, No significant path deviation, Step through +,  Psych: Normal affect/thought/speech/language     Assessment & Plan:  25 minutes face to face where spent in total with counseling / coordination of care took more than 50% of the total time. Care was coordinated with Pharmacy who also saw patient during his visit.

## 2019-04-24 NOTE — Assessment & Plan Note (Signed)
Adequate blood pressure control.  No evidence of new end organ damage.  Tolerating medication without significant adverse effects.  Plan to continue current blood pressure regiment.  Goal < 130/80.  May be able to stop carvedilol or swap for ACEI/ARB, especially if Ur albumin/creatinine (+) macroalbuminuria.

## 2019-04-24 NOTE — Assessment & Plan Note (Signed)
Again, discussed need for colonoscopy with Ms Laughner.  She wants to put this off until after her podiatry surgery.

## 2019-04-24 NOTE — Assessment & Plan Note (Addendum)
Established problem that has improved.  Lab Results  Component Value Date   HGBA1C 7.6 (A) 04/23/2019   Continue NPH 25 units BID and metformin 1000 BID Restart Victoza 0.6 [place] daily x 2 days then 1.2 daily [place] Will try tapering down NPH at next ov in 3 months.   Order for glucometer covered by her BCBS plan (at least on our chart)  Will need to check Urine Albumin:Creatinine next visit to see if needs ACEI/ARB  Patient needs pap, she would prefer a female provider to perform it.

## 2019-04-27 ENCOUNTER — Other Ambulatory Visit: Payer: Self-pay

## 2019-04-27 DIAGNOSIS — E1159 Type 2 diabetes mellitus with other circulatory complications: Secondary | ICD-10-CM

## 2019-04-28 MED ORDER — BAYER CONTOUR MONITOR DEVI: 1.0000 | Freq: Three times a day (TID) | 99 refills | Status: DC

## 2019-05-05 NOTE — Progress Notes (Signed)
Subjective:  Patient ID: [name], female    DOB: [birth year],  MRN: [id number]  Chief Complaint  Patient presents with  . Diabetic Ulcer    right fourth toe, lateral side - xrays done 04/07/2019    53 y.o. female presents for wound care. Reports wound to the right fourth toe. Was referred for possible surgical discussion.   Review of Systems: Negative except as noted in the HPI. Denies N/V/F/Ch.  Past Medical History:  Diagnosis Date  . Abnormal bruising 06/18/2016  . ARF (acute respiratory failure) ([place]) 02/10/2016  . CAD (coronary artery disease) 02/10/2016   a. 03/14/2016 Staged PCI with DES to mid RCA. b. NSTEMI 02/2016: DES to mid-LAD, POBA to 1st Diag, 85% stenosis of RCA noted with staged PCI recommended  . Chronic combined systolic (congestive) and diastolic (congestive) heart failure ([place])    a. Echo 02/2016: EF 40-45% w/ Grade 2 DD. 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  Marland Kitchen Dysphagia 03/09/2016   Referred to [facility] 03/09/2016    . Dyspnea 03/09/2016   Off ACEi since 02/19/16 -Spirometry 03/09/2016  No airflow obst     . Fall 03/01/2016  . Flash pulmonary edema ([place]) 02/10/2016   requiring intubation  . History of MRSA infection 11/05/2010   Skin abscesses  . History of myocardial infarction 02/04/2016  . Hypertension   . Hypokalemia 03/08/2017  . Leukocytosis 05/21/2016  . Meralgia paresthetica of right side 12/18/2013  . Microcytic anemia 05/18/2016  . Myocardial infarction ([place]) 02/2016  . NSTEMI (non-ST elevated myocardial infarction) ([place])   . Obesity (BMI 30-39.9) 04/03/2016  . Orthostatic hypotension 04/27/2014  . OSA (obstructive sleep apnea)   . OSA on CPAP   . Required emergent intubation 02/10/2016  . Stridor 02/19/2016  . Type II diabetes mellitus ([facility])     Current Outpatient Medications:  .  amLODipine (NORVASC) 10 MG tablet, Take 1 tablet (10 mg total) by mouth daily., Disp: 60 tablet, Rfl: 5 .  atorvastatin (LIPITOR) 40 MG tablet, TAKE  1 TABLET BY MOUTH ONCE DAILY, Disp: 30 tablet, Rfl: 11 .  Bayer Microlet Lancets lancets, Use as instructed, Disp: 100 each, Rfl: 12 .  blood glucose meter kit and supplies KIT, Check sugars three times a day, Disp: 1 each, Rfl: 0 .  Blood Glucose Monitoring Suppl (CONTOUR BLOOD GLUCOSE SYSTEM) DEVI, 1 Device by Does not apply route 3 (three) times daily., Disp: 100 strip, Rfl: PRN .  carvedilol (COREG) 12.5 MG tablet, Take 1 tablet (12.5 mg total) by mouth 2 (two) times daily with a meal., Disp: 180 tablet, Rfl: 3 .  gabapentin (NEURONTIN) 300 MG capsule, TAKE 3 CAPSULES BY MOUTH THREE TIMES DAILY, Disp: 270 capsule, Rfl: 3 .  glucose blood test strip, Use as instructed, Disp: 100 each, Rfl: 12 .  hydrochlorothiazide (MICROZIDE) 12.5 MG capsule, Take 1 capsule (12.5 mg total) by mouth daily., Disp: 90 capsule, Rfl: 3 .  insulin NPH Human (HUMULIN N,NOVOLIN N) 100 UNIT/ML injection, Inject 0.25 mLs (25 Units total) into the skin 2 (two) times daily before a meal., Disp: 10 mL, Rfl: 11 .  INSULIN SYRINGE 1CC/29G 29G X 1/2" 1 ML MISC, Use to inject insulin daily., Disp: 100 each, Rfl: 1 .  liraglutide (VICTOZA) 18 MG/3ML SOPN, Inject 0.2 mLs (1.2 mg total) into the skin daily., Disp: 2 pen, Rfl: 3 .  metFORMIN (GLUCOPHAGE) 1000 MG tablet, TAKE 1 TABLET BY MOUTH TWICE DAILY WITH A MEAL, Disp: 180 tablet,  Rfl: 3 .  polyethylene glycol (MIRALAX / GLYCOLAX) packet, Take 17 g by mouth as needed for mild constipation or moderate constipation. , Disp: , Rfl:   Social History   Tobacco Use  Smoking Status Current Some Day Smoker  . Types: Cigarettes  Smokeless Tobacco Never Used    Allergies  Allergen Reactions  . Ace Inhibitors Swelling    Subglottic edema requiring intubation for respiratory failure   Objective:   Vitals:   04/17/19 1041  Temp: (!) 97.3 F (36.3 C)   There is no height or weight on file to calculate BMI. Constitutional Well developed. Well nourished.  Vascular Dorsalis  pedis pulses palpable bilaterally. Posterior tibial pulses palpable bilaterally. Capillary refill normal to all digits.  No cyanosis or clubbing noted. Pedal hair growth normal.  Neurologic Normal speech. Oriented to person, place, and time. Protective sensation absent  Dermatologic Wound lateral PIPJ fourth toe with small semi-epithelialized area no active drainage no warmth no erythema no signs of acute infection  Orthopedic: No pain to palpation either foot.  Hammertoe formation lesser digits right foot   Radiographs: none today. Assessment:   1. Diabetic ulcer of toe of right foot associated with diabetes mellitus due to underlying condition, limited to breakdown of skin ([place])   2. Hammertoe of right foot    Plan:  Patient was evaluated and treated and all questions answered.  Ulcer right 4th toe -He also does appear to be improving.  Continue Iodosorb.  Discussed possible surgical correction however given tobacco use would avoid surgery.  Dispensed silicone toe spacers.  Advised that if she continues to use pressure reduction techniques and local wound care think the wound will heal uneventfully  Return in about 1 month (around 05/17/2019) for Wound check.

## 2019-05-15 ENCOUNTER — Other Ambulatory Visit: Payer: Self-pay

## 2019-05-15 ENCOUNTER — Ambulatory Visit (INDEPENDENT_AMBULATORY_CARE_PROVIDER_SITE_OTHER): Payer: BC Managed Care – PPO

## 2019-05-15 ENCOUNTER — Ambulatory Visit (INDEPENDENT_AMBULATORY_CARE_PROVIDER_SITE_OTHER): Payer: BC Managed Care – PPO | Admitting: Podiatry

## 2019-05-15 ENCOUNTER — Other Ambulatory Visit: Payer: Self-pay | Admitting: Family Medicine

## 2019-05-15 ENCOUNTER — Encounter: Payer: Self-pay | Admitting: Podiatry

## 2019-05-15 ENCOUNTER — Telehealth: Payer: Self-pay | Admitting: *Deleted

## 2019-05-15 VITALS — Temp 97.3°F

## 2019-05-15 DIAGNOSIS — E1142 Type 2 diabetes mellitus with diabetic polyneuropathy: Secondary | ICD-10-CM

## 2019-05-15 DIAGNOSIS — L97511 Non-pressure chronic ulcer of other part of right foot limited to breakdown of skin: Secondary | ICD-10-CM

## 2019-05-15 DIAGNOSIS — M2041 Other hammer toe(s) (acquired), right foot: Secondary | ICD-10-CM

## 2019-05-15 DIAGNOSIS — E08621 Diabetes mellitus due to underlying condition with foot ulcer: Secondary | ICD-10-CM

## 2019-05-15 DIAGNOSIS — M869 Osteomyelitis, unspecified: Secondary | ICD-10-CM

## 2019-05-15 DIAGNOSIS — M8580 Other specified disorders of bone density and structure, unspecified site: Secondary | ICD-10-CM

## 2019-05-15 DIAGNOSIS — Z01818 Encounter for other preprocedural examination: Secondary | ICD-10-CM

## 2019-05-15 HISTORY — DX: Osteomyelitis, unspecified: M86.9

## 2019-05-15 HISTORY — DX: Diabetes mellitus due to underlying condition with foot ulcer: E08.621

## 2019-05-15 HISTORY — DX: Non-pressure chronic ulcer of other part of right foot limited to breakdown of skin: L97.511

## 2019-05-15 MED ORDER — CIPROFLOXACIN HCL 250 MG PO TABS: 250.0000 mg | ORAL_TABLET | Freq: Two times a day (BID) | ORAL | 0 refills | Status: DC

## 2019-05-15 MED ORDER — CLINDAMYCIN HCL 300 MG PO CAPS: 300.0000 mg | ORAL_CAPSULE | Freq: Two times a day (BID) | ORAL | 0 refills | Status: DC

## 2019-05-15 NOTE — Telephone Encounter (Signed)
I attempted to call the patient to verify that she was informed that her surgery will be May 21, 2019.  I couldn't leave a message because her voicemail had not been set up.

## 2019-05-15 NOTE — Telephone Encounter (Signed)
DOS 05/21/2019; 92957 - EXCISION BENIGN LESION 1.0 CM, 11981 - INSERTION ANTIBIOTIC DELIVERY DEVICE, 47340 -  PARTIAL RESECTION OF PHALANX, 14040 - ADJACENT TISSUE TRANSFER,  AND 37096 - INCISION ON THE RT FOOT  BCBS: Effective Date - 11/05/2018 - 11/04/9998   In-Network    Max Per Benefit Period Year-to-Date Remaining  CoInsurance     Deductible  $2,500.00 856.90  Out-Of-Pocket  $[phone] [phone]   In-Network Individual  [place] Coinsurance Authorization Required  Not Applicable  43%  Unknown  Utilization Management Organization: [employer] :

## 2019-05-15 NOTE — Patient Instructions (Signed)
Pre-Operative Instructions  Congratulations, you have decided to take an important step towards improving your quality of life.  You can be assured that the doctors and staff at [facility] will be with you every step of the way.  Here are some important things you should know:  1. Plan to be at the surgery center/hospital at least 1 (one) hour prior to your scheduled time, unless otherwise directed by the surgical center/hospital staff.  You must have a responsible adult accompany you, remain during the surgery and drive you home.  Make sure you have directions to the surgical center/hospital to ensure you arrive on time. 2. If you are having surgery at [facility] or [facility] hospitals, you will need a copy of your medical history and physical form from your family physician within one month prior to the date of surgery. We will give you a form for your primary physician to complete.  3. We make every effort to accommodate the date you request for surgery.  However, there are times where surgery dates or times have to be moved.  We will contact you as soon as possible if a change in schedule is required.   4. No aspirin/ibuprofen for one week before surgery.  If you are on aspirin, any non-steroidal anti-inflammatory medications (Mobic, Aleve, Ibuprofen) should not be taken seven (7) days prior to your surgery.  You make take Tylenol for pain prior to surgery.  5. Medications - If you are taking daily heart and blood pressure medications, seizure, reflux, allergy, asthma, anxiety, pain or diabetes medications, make sure you notify the surgery center/hospital before the day of surgery so they can tell you which medications you should take or avoid the day of surgery. 6. No food or drink after midnight the night before surgery unless directed otherwise by surgical center/hospital staff. 7. No alcoholic beverages 24-hours prior to surgery.  No smoking 24-hours prior or 24-hours after  surgery. 8. Wear loose pants or shorts. They should be loose enough to fit over bandages, boots, and casts. 9. Don't wear slip-on shoes. Sneakers are preferred. 10. Bring your boot with you to the surgery center/hospital.  Also bring crutches or a walker if your physician has prescribed it for you.  If you do not have this equipment, it will be provided for you after surgery. 11. If you have not been contacted by the surgery center/hospital by the day before your surgery, call to confirm the date and time of your surgery. 12. Leave-time from work may vary depending on the type of surgery you have.  Appropriate arrangements should be made prior to surgery with your employer. 13. Prescriptions will be provided immediately following surgery by your doctor.  Fill these as soon as possible after surgery and take the medication as directed. Pain medications will not be refilled on weekends and must be approved by the doctor. 14. Remove nail polish on the operative foot and avoid getting pedicures prior to surgery. 15. Wash the night before surgery.  The night before surgery wash the foot and leg well with water and the antibacterial soap provided. Be sure to pay special attention to beneath the toenails and in between the toes.  Wash for at least three (3) minutes. Rinse thoroughly with water and dry well with a towel.  Perform this wash unless told not to do so by your physician.  Enclosed: 1 Ice pack (please put in freezer the night before surgery)   1 Hibiclens skin cleaner     Pre-op instructions  If you have any questions regarding the instructions, please do not hesitate to call our office.  [place]: [address], [place], [place] [address] -- [phone]  [place]: [address] , [place] [address] -- [phone]  [place]: [address]  [place], [place] [address] -- [phone]  [place]: [address], [address], [place], [place] [address] -- [phone]  Website:  [email or web address] 

## 2019-05-18 ENCOUNTER — Encounter: Payer: Self-pay | Admitting: Family Medicine

## 2019-05-18 ENCOUNTER — Ambulatory Visit (INDEPENDENT_AMBULATORY_CARE_PROVIDER_SITE_OTHER): Payer: BC Managed Care – PPO | Admitting: Family Medicine

## 2019-05-18 ENCOUNTER — Other Ambulatory Visit (HOSPITAL_COMMUNITY)
Admission: RE | Admit: 2019-05-18 | Discharge: 2019-05-18 | Disposition: A | Discharge status: Home / Self Care | Payer: BC Managed Care – PPO | Source: Ambulatory Visit | Attending: Podiatry | Admitting: Podiatry

## 2019-05-18 ENCOUNTER — Other Ambulatory Visit: Payer: Self-pay

## 2019-05-18 ENCOUNTER — Telehealth: Payer: Self-pay | Admitting: Podiatry

## 2019-05-18 DIAGNOSIS — E1142 Type 2 diabetes mellitus with diabetic polyneuropathy: Secondary | ICD-10-CM

## 2019-05-18 DIAGNOSIS — Z1159 Encounter for screening for other viral diseases: Secondary | ICD-10-CM | POA: Diagnosis not present

## 2019-05-18 DIAGNOSIS — Z01810 Encounter for preprocedural cardiovascular examination: Secondary | ICD-10-CM

## 2019-05-18 NOTE — Progress Notes (Signed)
    Subjective:  [name] is a 53 y.o. female who presents to the [facility] today for presurgical risk stratification.   HPI: She reports a chronic wound of the right 4th toe for months/years.  She has had multiple appointments for this in the past and attempted excision of a corn in addition to antibiotics for infection.  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  She was told that they podiatry will try to save the toe but they are not sure it will be possible.  Surgery for debridement with possible removal of infected bone scheduled for 7/16.  She reports that she has never been fully sedated and is not sure how she would recover following sedation.  She reports a history of obstructive sleep apnea, using CPAP nightly.  She reports smoking marijuana 2-3 times per week.  Regarding her heart history, she has history of coronary artery disease status post stent placement of the RCA in 2017.  She is previously experienced a heart failure exacerbation is currently well controlled with no shortness of breath, chest pain on exertion.  She reports lying flat at night.  She reports mild lower extremity edema for the past month, improved in the mornings.   Chief Complaint noted Review of Symptoms - see HPI PMH - NSTEMI, CAD, heart failure, obesity, OSA, marijuana use, diabetes  Objective:  Physical Exam: BP 122/76   Pulse 69   Wt 225 lb (102.1 kg)   LMP 01/13/2018 (LMP Unknown)   SpO2 97%   BMI 40.50 kg/m    Gen: Morbidly obese woman, NAD, resting comfortably CV: RRR with no murmurs appreciated Pulm: NWOB, CTAB with no crackles, wheezes, or rhonchi GI: Normal bowel sounds present. Soft, Nontender, Nondistended. MSK: no edema, cyanosis, or clubbing noted Skin: warm, dry Extremities: 1+ LE edema bilaterally  [facility] Safe calculator: 0.1% rist of MI or cardiac arrest intraoperatively or up to 30 days post op.  No results found for this or any previous visit (from the past  72 hour(s)).   Assessment/Plan:  Encounter for pre-operative cardiovascular clearance According to the [facility] calculator, her risk of MI in the perioperative setting is 0.1%.  Given her history of diabetes, NSTEMI, heart failure, OSA, obesity, smoking I would put her in the low to medium risk category.  Prior to surgery, have recommended that she stop smoking entirely.  I see no additional areas for modification of risk factors prior to surgery.  I also recommend that anesthesiology consider local anesthesia in place of general anesthesia for this patient.  Ultimately, I would defer to anesthesiology's decision.  She has mentioned bilateral lower extremity edema in the past month without shortness of breath, chest pain, she is able to sleep flat.  I have low suspicion for decompensated heart failure at this time.  I have recommended compression socks to help with lower extremity swelling.  -Smoking cessation prior to surgery -Low-medium risk for a low risk surgery

## 2019-05-18 NOTE — Patient Instructions (Addendum)
It was good to see you today.  Overall, you are low-medium risk for cardiac issues in the perioperative period (during surgery or in the month following surgery).  I recommend that local anesthesia is used as opposed to general anesthesia but I will defer to anesthesiology and podiatry to make the final decision.  Glucose monitor: the glucose monitor is called Contour Blood Glucose System.  Please pick up you presurgical risk stratification papers at the front desk in one day.

## 2019-05-18 NOTE — Telephone Encounter (Signed)
Pt called about her surgery scheduled for this Thursday. Stated when she was in last week she was given the options of Wednesday the 13 th, Wednesday the 22nd, or Wednesday 29 th. Pt stated she chose the 22nd as her date but she got a call that her surgery date was this Thursday the 16 th. I told the pt I had spoken with Dr. March Rummage earlier and was going to call her that he would prefer if she kept her surgery for this Thursday due to the infection. That Dr. March Rummage was worried about the infection spreading or getting worse. I explained that our doctors don't have "block time" at the hospital and that is why it may have been scheduled for this Thursday instead of his normal surgery day, Wednesday. Pt stated she would have surgery this Thursday and wanted to know her time. I told her the surgery is at 7:30 am but someone from the scheduling department would be calling her either tomorrow or Wednesday to let her know what time to arrive for her surgery. I told her if she had not heard anything by Wednesday, to call them. I apologized to the pt for any inconvenience and the miscommunication and that I was filling in for our surgical coordinator, Delydia. Told pt to call us with any other questions and/or concerns she may have.

## 2019-05-18 NOTE — Assessment & Plan Note (Signed)
According to the [facility] calculator, her risk of MI in the perioperative setting is 0.1%.  Given her history of diabetes, NSTEMI, heart failure, OSA, obesity, smoking I would put her in the low to medium risk category.  Prior to surgery, have recommended that she stop smoking entirely.  I see no additional areas for modification of risk factors prior to surgery.  I also recommend that anesthesiology consider local anesthesia in place of general anesthesia for this patient.  Ultimately, I would defer to anesthesiology's decision.  She has mentioned bilateral lower extremity edema in the past month without shortness of breath, chest pain, she is able to sleep flat.  I have low suspicion for decompensated heart failure at this time.  I have recommended compression socks to help with lower extremity swelling.  -Smoking cessation prior to surgery -Low-medium risk for a low risk surgery

## 2019-05-18 NOTE — Telephone Encounter (Signed)
Noted thanks °

## 2019-05-19 LAB — SARS CORONAVIRUS 2 (TAT 6-24 HRS): SARS Coronavirus 2: NEGATIVE

## 2019-05-20 ENCOUNTER — Encounter (HOSPITAL_COMMUNITY): Payer: Self-pay | Admitting: *Deleted

## 2019-05-20 ENCOUNTER — Other Ambulatory Visit: Payer: Self-pay

## 2019-05-20 ENCOUNTER — Telehealth: Payer: Self-pay | Admitting: *Deleted

## 2019-05-20 NOTE — Progress Notes (Signed)
Subjective:  Patient ID: [name], female    DOB: August 03, 1966,  MRN: [id number]  Chief Complaint  Patient presents with  . Foot Ulcer    the 4th toe on the right is draining with some yellow and greenish color and has an odor    53 y.o. female presents for wound care.  Reports new drainage to the wound.  Has been applying Iodosorb but states the wound is worse this time than it was previously.  Review of Systems: Negative except as noted in the HPI. Denies N/V/F/Ch.  Past Medical History:  Diagnosis Date  . Abnormal bruising 06/18/2016  . ARF (acute respiratory failure) ([place]) 02/10/2016  . CAD (coronary artery disease) 02/10/2016   a. 03/14/2016 Staged PCI with DES to mid RCA. b. NSTEMI 02/2016: DES to mid-LAD, POBA to 1st Diag, 85% stenosis of RCA noted with staged PCI recommended  . Chronic combined systolic (congestive) and diastolic (congestive) heart failure ([place])    a. Echo 02/2016: EF 40-45% w/ Grade 2 DD. 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  Marland Kitchen Dysphagia 03/09/2016   Referred to [facility] 03/09/2016    . Dyspnea 03/09/2016   Off ACEi since 02/19/16 -Spirometry 03/09/2016  No airflow obst     . Fall 03/01/2016  . Flash pulmonary edema ([place]) 02/10/2016   requiring intubation  . History of MRSA infection 11/05/2010   Skin abscesses  . History of myocardial infarction 02/04/2016  . Hypertension   . Hypokalemia 03/08/2017  . Leukocytosis 05/21/2016  . Meralgia paresthetica of right side 12/18/2013  . Microcytic anemia 05/18/2016  . Myocardial infarction ([place]) 02/2016  . NSTEMI (non-ST elevated myocardial infarction) ([place])   . Obesity (BMI 30-39.9) 04/03/2016  . Orthostatic hypotension 04/27/2014  . OSA (obstructive sleep apnea)   . OSA on CPAP   . Pneumonia   . Required emergent intubation 02/10/2016  . Stridor 02/19/2016  . Type II diabetes mellitus (HCC)     Current Outpatient Medications:  .  albuterol (VENTOLIN HFA) 108 (90 Base) MCG/ACT inhaler, Inhale  1-2 puffs into the lungs every 4 (four) hours as needed for wheezing or shortness of breath., Disp: , Rfl:  .  amLODipine (NORVASC) 10 MG tablet, Take 1 tablet (10 mg total) by mouth daily., Disp: 60 tablet, Rfl: 5 .  atorvastatin (LIPITOR) 40 MG tablet, TAKE 1 TABLET BY MOUTH ONCE DAILY, Disp: 30 tablet, Rfl: 11 .  Bayer Microlet Lancets lancets, Use as instructed, Disp: 100 each, Rfl: 12 .  blood glucose meter kit and supplies KIT, Check sugars three times a day, Disp: 1 each, Rfl: 0 .  Blood Glucose Monitoring Suppl (CONTOUR BLOOD GLUCOSE SYSTEM) DEVI, 1 Device by Does not apply route 3 (three) times daily., Disp: 100 strip, Rfl: PRN .  carvedilol (COREG) 12.5 MG tablet, Take 1 tablet (12.5 mg total) by mouth 2 (two) times daily with a meal., Disp: 180 tablet, Rfl: 3 .  gabapentin (NEURONTIN) 300 MG capsule, TAKE 3 CAPSULES BY MOUTH THREE TIMES DAILY, Disp: 270 capsule, Rfl: 1 .  glucose blood test strip, Use as instructed, Disp: 100 each, Rfl: 12 .  hydrochlorothiazide (MICROZIDE) 12.5 MG capsule, Take 1 capsule (12.5 mg total) by mouth daily., Disp: 90 capsule, Rfl: 3 .  insulin NPH Human (HUMULIN N,NOVOLIN N) 100 UNIT/ML injection, Inject 0.25 mLs (25 Units total) into the skin 2 (two) times daily before a meal., Disp: 10 mL, Rfl: 11 .  INSULIN SYRINGE 1CC/29G 29G X 1/2"  1 ML MISC, Use to inject insulin daily., Disp: 100 each, Rfl: 1 .  liraglutide (VICTOZA) 18 MG/3ML SOPN, Inject 0.2 mLs (1.2 mg total) into the skin daily., Disp: 2 pen, Rfl: 3 .  metFORMIN (GLUCOPHAGE) 1000 MG tablet, TAKE 1 TABLET BY MOUTH TWICE DAILY WITH A MEAL, Disp: 180 tablet, Rfl: 3 .  polyethylene glycol (MIRALAX / GLYCOLAX) packet, Take 17 g by mouth as needed for mild constipation or moderate constipation. , Disp: , Rfl:   Social History   Tobacco Use  Smoking Status Never Smoker  Smokeless Tobacco Never Used    Allergies  Allergen Reactions  . Ace Inhibitors Swelling    Subglottic edema requiring  intubation for respiratory failure   Objective:   Vitals:   05/15/19 1032  Temp: (!) 97.3 F (36.3 C)   There is no height or weight on file to calculate BMI. Constitutional Well developed. Well nourished.  Vascular Dorsalis pedis pulses palpable bilaterally. Posterior tibial pulses palpable bilaterally. Capillary refill normal to all digits.  No cyanosis or clubbing noted. Pedal hair growth normal.  Neurologic Normal speech. Oriented to person, place, and time. Protective sensation absent  Dermatologic Wound lateral PIPJ fourth toe with probe to bone, edema, no significant warmth or erythema. No purulence expressible.  Orthopedic: No pain to palpation either foot.  Hammertoe formation lesser digits right foot   Radiographs: erosion noted proximal phalanx Assessment:   1. Diabetic ulcer of toe of right foot associated with diabetes mellitus due to underlying condition, limited to breakdown of skin ([place])   2. Pre-procedural examination   3. Osteomyelitis of fourth toe of right foot ([place])   4. Bone erosion   5. Hammertoe of right foot   6. Diabetic peripheral neuropathy associated with type 2 diabetes mellitus ([place])    Plan:  Patient was evaluated and treated and all questions answered.  Ulcer right 4th toe -Repeat XR show new erosion of the bone -Discussed the patient that due to erosion of the bone normal treatment for this would be toe amputation.  Discussed with pat patient order stated oral laxatives will be assisted with ient possibility of salvage option including debridement of all nonviable bone possible packing antibiotic beads and wound closure.  Patient elects for salvage option.  Discussed she may still end up with amputation despite attempted salvage.  Patient verbalized understanding. -Consent form drawn for procedure.  Reviewed and signed by patient. -Patient has failed all conservative therapy and wishes to proceed with surgical intervention. All risks,  benefits, and alternatives discussed with patient. No guarantees given. Consent reviewed and signed by patient. -Planned procedures: Right fourth toe debridement of infected bone, phalangectomy, application of antibiotic delivery device, wound excision and closure.   No follow-ups on file.

## 2019-05-20 NOTE — Progress Notes (Signed)
Anesthesia Chart Review: SAME DAY WORK-UP   Case: [id number] Date/Time: 05/21/19 0715   Procedures:      Right fourth toe debridement and removal of infected bone (Right )     Bone Biopsy with placement of antbiotic delivery device (Right )   Anesthesia type: Monitor Anesthesia Care   Diagnosis:      Diabetic ulcer of toe of right foot associated with diabetes mellitus due to underlying condition, limited to breakdown of skin ([place]) [Z12.458, L97.511]     Osteomyelitis of fourth toe of right foot ([place]) [M86.9]   Pre-op diagnosis: Right 4th toe wound, osteomyelitis   Location: [facility] OR ROOM 08 / [facility]   Surgeon: Evelina Bucy, DPM      DISCUSSION: Patient is a 53 year old female scheduled for the above procedure.  History includes smoking, CAD (NSTEMI 02/2016; s/p DES mLAD and PTCA D1 02/15/16; s/p staged DES mRCA, medical therapy for residual dRCA and RPDA disease 03/14/16), chronic combined systolic and diastolic CHF (EF normalized 02/2016 TEE), DM2, OSA (CPAP use), HLD, microcytic anemia, subglottic stenosis (4/20107, related to recent intubation versus ACE-I angioedema), C4-5/6-7 08/28/12.  - Admitted 02/10/16 with acute respiratory distress and acute on chronic diastolic CHF. Bedside echo showed depressed LVEF and IVC dynamics consistent with volume overload. She required intubation due to declining mental status and poor respiratory effort. Labs consistent with NSTEMI. Cardiology consulted. 2D echo showed LVEF 40-45% with apical and inferoapical akinesis and question of MV mass. Cardiac cath performed 02/15/16 and underwent DES mLAD and PTCA D1 with plans for staged RCA PCI (s/p mRCA DES 03/14/16). It was not felt that she could tolerate TEE at that time, so discharged 02/17/16, but re-admitted 02/19/16 with stridor. ENT Izora Gala, MD consulted with arytenoid edema noted on flexible fiberoptic laryngoscopy. Probable subglottic edema, possibly early stenosis following recent intubation. CT neck showed up  to 50% tracheal narrowing. Initially treated medically, but required intubation that evening (Glidescope 4, 7.0 ETT). ACE-I discontinued (presumably due to question so angioedema). TEE done 02/22/19 and did not show any vegetations or thrombus, EF normalized. Extubated 02/23/19. Discharged 02/25/16 on prednisone taper.  02/19/16 Intubation note (in setting of subglottic edema) by Germain Osgood, MD:  Patient sedated with fentanyl, versed, then etomidate. Easy bag.  On first attempt, airway noted to be anterior, no swelling noted, difficulty feeding ETT with glidescope stylet, so Bougie advanced through the cords. As tube was being fed, Bougie was displaced. Attempt aborted.  Additional medication with 37m propofol and additional 280metomidate.  2nd look again with good visualization, easy passage of Bougie, then easy passage of 7.0 ETT through the cords to a depth of 23 cm at the teeth. Bougie removed. + chest rise, good color change. +bilateral breath sounds. No sounds over the epigastrium.   Patient seen by FrMatilde HaymakerMD on 05/18/19 for H&P and preoperative risk assessment. He wrote, "Encounter for pre-operative cardiovascular clearance According to the GuEMCORher risk of MI in the perioperative setting is 0.1%.  Given her history of diabetes, NSTEMI, heart failure, OSA, obesity, smoking I would put her in the low to medium risk category.  Prior to surgery, have recommended that she stop smoking entirely.  I see no additional areas for modification of risk factors prior to surgery.  I also recommend that anesthesiology consider local anesthesia in place of general anesthesia for this patient. Ultimately, I would defer to anesthesiology's decision.  She has mentioned bilateral lower extremity edema in the past month  without shortness of breath, chest pain, she is able to sleep flat.  I have low suspicion for decompensated heart failure at this time.  I have recommended compression socks to help  with lower extremity swelling.  -Smoking cessation prior to surgery -Low-medium risk for a low risk surgery".  She has known CAD, CHF (with normalization of EF 02/2016). She has not seen cardiology in just over two years. PCI in 2017. Dr. March Rummage did send her to primary care for preoperative risk assessment that does address CAD and CHF. No active symptoms and "no additional areas for modification of risk factors." She was cleared with overall moderate risk with preference for MAC if feasible by surgeon and anesthesiologist. Labs and EKG were not done as part of her clearance, so these will need to be done on the day of surgery. If results are stable and no acute changes in her status then would anticipate that she could proceed. Anesthesia team to review same day results and evaluate patient--definitve anesthesia plan made at that time.  05/18/19 presurgical COVID-19 test was negative.   PROVIDERS: McDiarmid, Blane Ohara, MD is PCP. Last seen by Matilde Haymaker, MD 05/18/19. [name], MD is cardiologist. Last visit 01/22/17.   LABS: She will need updated labs prior to surgery. As of 01/15/19, her Cr was 1.02, H/H 11.8/36.1, PLT 232, glucose 161. A1c was 7.6 on 04/23/19.    EKG: It does not look like she had a preoperative EKG done as part of her pre-operative evaluation. She will need an EKG on the day of surgery.    CV: TEE 02/22/16 (to evaluate for mass/thrombus, see 02/13/16 TTE): Study Conclusions - Left ventricle: Hypertrophy was noted. Systolic function was   normal. The estimated ejection fraction was in the range of 55%   to 60%. - Aortic valve: No evidence of vegetation. - Mitral valve: No mass or vegetation seen. No evidence of   vegetation. - Right atrium: No evidence of thrombus in the atrial cavity or   appendage. - Atrial septum: No defect or patent foramen ovale was identified. - Pulmonic valve: No evidence of vegetation.   TTE 02/13/16 (in setting of acute respiratory failure,  NSTEMI, pre-PCI): Study Conclusions - Left ventricle: The cavity size was normal. Wall thickness was   increased in a pattern of severe LVH. Systolic function was   mildly to moderately reduced. The estimated ejection fraction was   in the range of 40% to 45%. Apical and inferoapical akinesis. No   mural thrombus with Definity. Doppler parameters are consistent   with pseudonormal left ventricular relaxation (grade 2 diastolic   dysfunction). The E/e&' ratio is >20, suggesting markedly elevated   LV filling pressure. - Mitral valve: Calcified annulus. 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 This could represent thrombus or vegetation, or   could be artifactual. There was trivial regurgitation. - Left atrium: The atrium was mildly dilated. - Right ventricle: The cavity size was normal. Wall thickness was   normal. Systolic function was normal. - Right atrium: The atrium was mildly dilated. - Atrial septum: No defect or patent foramen ovale was identified. - Inferior vena cava: The vessel was normal in size. The   respirophasic diameter changes were in the normal range (= 50%),   consistent with normal central venous pressure.  Impressions: - Compared to a recent echo on 02/10/2016, the LVEF is lower at   40-45% with apical and inferoapical akinesis. No mural thrombus   seen with Definity. There is a questionable prolapsing mass on   the mitral valve, but only seen in the 2 chamber view, trivial   associated AI - this may be artifact or possibly thrombus.   Recommend further evalution by TEE to exclude thrombus given   reduced LV function and apical wall motion abnormality.   Cardiac cath 02/15/16: CONCLUSION:  Ost Cx to Prox Cx lesion, 20% stenosed.  Ost LAD lesion, 50% stenosed.  2nd Mrg lesion, 40% stenosed.  Ost 3rd Mrg to 3rd Mrg lesion, 50%  stenosed.  Ost 2nd Diag to 2nd Diag lesion, 70% stenosed.  Mid RCA lesion, 85% stenosed.  Dist RCA lesion, 30% stenosed.  Ost RPDA to RPDA lesion, 50% stenosed.  Mid LAD to Dist LAD lesion, 99% stenosed. Post intervention, there is a 0% residual stenosis.  Ost 1st Diag to 1st Diag lesion, 99% stenosed. Post intervention, there is a 40% residual stenosis. 1. Severe two-vessel coronary artery disease involving the mid to distal LAD, large first diagonal and mid right coronary artery. 2. Moderately elevated left ventricular end-diastolic pressure with an LVEDP of 22 mmHg. Left ventricular angiography was not performed due to recent renal failure and known EF by echo. 3.Successful angioplasty and drug-eluting stent placement to the mid LAD and balloon angioplasty of the first diagonal. Recommendations: Dual antiplatelet therapy for at least one year. Staged RCA PCI in 3-4 weeks.  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 Avoid the right radial artery catheterization due to dual radial system.  PCI 02/15/16: PTCA/DES to mid LAD and balloon angioplasty of D1 PCI 03/14/16: PTCA/DES to mid RCA. The distal RCA has 50% diffuse disease into the right PDA which has 70% stenosis. These were left to be treated medically.     Past Medical History:  Diagnosis Date  . Abnormal bruising 06/18/2016  . ARF (acute respiratory failure) ([place]) 02/10/2016  . CAD (coronary artery disease) 02/10/2016   a. 03/14/2016 Staged PCI with DES to mid RCA. b. NSTEMI 02/2016: DES to mid-LAD, POBA to 1st Diag, 85% stenosis of RCA noted with staged PCI recommended  . Chronic combined systolic (congestive) and diastolic (congestive) heart failure ([place])    a. Echo 02/2016: EF 40-45% w/ Grade 2 DD. 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  Marland Kitchen Dysphagia 03/09/2016   Referred to [facility] 03/09/2016    . Dyspnea 03/09/2016   Off  ACEi since 02/19/16 -Spirometry 03/09/2016  No airflow obst     . Fall 03/01/2016  . Flash pulmonary edema ([place]) 02/10/2016   requiring intubation  . History of MRSA infection 11/05/2010   Skin abscesses  . History of myocardial infarction 02/04/2016  . Hypertension   . Hypokalemia 03/08/2017  . Leukocytosis 05/21/2016  . Meralgia paresthetica of right side 12/18/2013  . Microcytic anemia 05/18/2016  . Myocardial infarction ([place]) 02/2016  . NSTEMI (non-ST elevated myocardial infarction) ([place])   . Obesity (BMI 30-39.9) 04/03/2016  . Orthostatic hypotension 04/27/2014  . OSA (obstructive sleep apnea)   . OSA on CPAP   . Required emergent intubation 02/10/2016  . Stridor 02/19/2016  . Type II diabetes mellitus ([place])     Past Surgical History:  Procedure Laterality Date  . ANTERIOR CERVICAL DECOMP/DISCECTOMY FUSION  08/28/2012  Procedure: ANTERIOR CERVICAL DECOMPRESSION/DISCECTOMY FUSION 2 LEVELS;  Surgeon: Otilio Connors, MD;  Location: [facility];  Service: Neurosurgery;  Laterality: N/A;  Cervical four-five, cervical six-seven Anterior cervical decompression/diskectomy/fusion/LifeNet Bone/Trestle plate  . BACK SURGERY    . BREAST CYST EXCISION Right   . CARDIAC CATHETERIZATION N/A 02/15/2016   Procedure: Left Heart Cath and Coronary Angiography;  Surgeon: Wellington Hampshire, MD;  Location: [facility] CV LAB;  Service: Cardiovascular;  Laterality: N/A;  . CARDIAC CATHETERIZATION N/A 02/15/2016   Procedure: Coronary Stent Intervention;  Surgeon: Wellington Hampshire, MD;  Location: [facility];  Service: Cardiovascular;  Laterality: N/A;  . CARDIAC CATHETERIZATION N/A 02/15/2016   Procedure: Coronary Balloon Angioplasty;  Surgeon: Wellington Hampshire, MD;  Location: [facility];  Service: Cardiovascular;  Laterality: N/A;  . CARDIAC CATHETERIZATION N/A 03/14/2016   Procedure: Coronary Stent Intervention;  Surgeon: Wellington Hampshire, MD;  Location: [facility] LAB;  Service: Cardiovascular;   Laterality: N/A;  . CORONARY ANGIOPLASTY WITH STENT PLACEMENT    . SPLIT NIGHT STUDY  04/07/2016    MEDICATIONS: No current facility-administered medications for this encounter.    Marland Kitchen albuterol (VENTOLIN HFA) 108 (90 Base) MCG/ACT inhaler  . amLODipine (NORVASC) 10 MG tablet  . atorvastatin (LIPITOR) 40 MG tablet  . carvedilol (COREG) 12.5 MG tablet  . gabapentin (NEURONTIN) 300 MG capsule  . hydrochlorothiazide (MICROZIDE) 12.5 MG capsule  . insulin NPH Human (HUMULIN N,NOVOLIN N) 100 UNIT/ML injection  . liraglutide (VICTOZA) 18 MG/3ML SOPN  . metFORMIN (GLUCOPHAGE) 1000 MG tablet  . polyethylene glycol (MIRALAX / GLYCOLAX) packet  . Bayer Microlet Lancets lancets  . blood glucose meter kit and supplies KIT  . Blood Glucose Monitoring Suppl (CONTOUR BLOOD GLUCOSE SYSTEM) DEVI  . glucose blood test strip  . INSULIN SYRINGE 1CC/29G 29G X 1/2" 1 ML MISC    Myra Gianotti, PA-C Surgical Short Stay/Anesthesiology [facility] Phone [phone] [facility] Phone [phone] 05/20/2019 12:01 PM

## 2019-05-20 NOTE — Progress Notes (Signed)
Pt denies SOB and chest pain. Pt stated that she is under the care of Dr. Fletcher Anon, Cardiology. Pt denies having an EKG and chest x ray within the last year. Pt denies recent labs. Pt made aware to stop taking  vitamins, fish oil and herbal medications. Do not take any NSAIDs ie: Ibuprofen, Advil, Naproxen (Aleve), Motrin, BC and Goody Powder. Pt made aware to not take Victoza and Metoformin on DOS. Pt made aware to take 12 units of NPH at HS. Pt does not check her BG ( nurse unable to instruct pt on insulin dose DOS).  Pt denies that she and family members tested positive for COVID-19 ( pt tested on 05/18/19 and reminded to quarantine).   Coronavirus Screening  Pt denies that she and family members experienced the following symptoms:  Cough yes/no: No Fever (>100.110F)  yes/no: No Runny nose yes/no: No Sore throat yes/no: No Difficulty breathing/shortness of breath  yes/no: No  Have you or a family member traveled in the last 14 days and where? yes/no: No  Pt reminded that hospital visitation restrictions are in effect and the importance of the restrictions.   Pt verbalized understanding of all pre-op instructions.  PA, Anesthesiology, asked to review pt history; see note.

## 2019-05-20 NOTE — H&P (View-Only) (Signed)
Subjective:  Patient ID: [name], female    DOB: 06/07/1966,  MRN: [id number]  Chief Complaint  Patient presents with  . Foot Ulcer    the 4th toe on the right is draining with some yellow and greenish color and has an odor    52 y.o. female presents for wound care.  Reports new drainage to the wound.  Has been applying Iodosorb but states the wound is worse this time than it was previously.  Review of Systems: Negative except as noted in the HPI. Denies N/V/F/Ch.  Past Medical History:  Diagnosis Date  . Abnormal bruising 06/18/2016  . ARF (acute respiratory failure) (HCC) 02/10/2016  . CAD (coronary artery disease) 02/10/2016   a. 03/14/2016 Staged PCI with DES to mid RCA. b. NSTEMI 02/2016: DES to mid-LAD, POBA to 1st Diag, 85% stenosis of RCA noted with staged PCI recommended  . Chronic combined systolic (congestive) and diastolic (congestive) heart failure (HCC)    a. Echo 02/2016: EF 40-45% w/ Grade 2 DD. 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  . Dysphagia 03/09/2016   Referred to GI [place] 03/09/2016    . Dyspnea 03/09/2016   Off ACEi since 02/19/16 -Spirometry 03/09/2016  No airflow obst     . Fall 03/01/2016  . Flash pulmonary edema (HCC) 02/10/2016   requiring intubation  . History of MRSA infection 11/05/2010   Skin abscesses  . History of myocardial infarction 02/04/2016  . Hypertension   . Hypokalemia 03/08/2017  . Leukocytosis 05/21/2016  . Meralgia paresthetica of right side 12/18/2013  . Microcytic anemia 05/18/2016  . Myocardial infarction (HCC) 02/2016  . NSTEMI (non-ST elevated myocardial infarction) (HCC)   . Obesity (BMI 30-39.9) 04/03/2016  . Orthostatic hypotension 04/27/2014  . OSA (obstructive sleep apnea)   . OSA on CPAP   . Pneumonia   . Required emergent intubation 02/10/2016  . Stridor 02/19/2016  . Type II diabetes mellitus (HCC)     Current Outpatient Medications:  .  albuterol (VENTOLIN HFA) 108 (90 Base) MCG/ACT inhaler, Inhale  1-2 puffs into the lungs every 4 (four) hours as needed for wheezing or shortness of breath., Disp: , Rfl:  .  amLODipine (NORVASC) 10 MG tablet, Take 1 tablet (10 mg total) by mouth daily., Disp: 60 tablet, Rfl: 5 .  atorvastatin (LIPITOR) 40 MG tablet, TAKE 1 TABLET BY MOUTH ONCE DAILY, Disp: 30 tablet, Rfl: 11 .  Bayer Microlet Lancets lancets, Use as instructed, Disp: 100 each, Rfl: 12 .  blood glucose meter kit and supplies KIT, Check sugars three times a day, Disp: 1 each, Rfl: 0 .  Blood Glucose Monitoring Suppl (CONTOUR BLOOD GLUCOSE SYSTEM) DEVI, 1 Device by Does not apply route 3 (three) times daily., Disp: 100 strip, Rfl: PRN .  carvedilol (COREG) 12.5 MG tablet, Take 1 tablet (12.5 mg total) by mouth 2 (two) times daily with a meal., Disp: 180 tablet, Rfl: 3 .  gabapentin (NEURONTIN) 300 MG capsule, TAKE 3 CAPSULES BY MOUTH THREE TIMES DAILY, Disp: 270 capsule, Rfl: 1 .  glucose blood test strip, Use as instructed, Disp: 100 each, Rfl: 12 .  hydrochlorothiazide (MICROZIDE) 12.5 MG capsule, Take 1 capsule (12.5 mg total) by mouth daily., Disp: 90 capsule, Rfl: 3 .  insulin NPH Human (HUMULIN N,NOVOLIN N) 100 UNIT/ML injection, Inject 0.25 mLs (25 Units total) into the skin 2 (two) times daily before a meal., Disp: 10 mL, Rfl: 11 .  INSULIN SYRINGE 1CC/29G 29G X 1/2"   1 ML MISC, Use to inject insulin daily., Disp: 100 each, Rfl: 1 .  liraglutide (VICTOZA) 18 MG/3ML SOPN, Inject 0.2 mLs (1.2 mg total) into the skin daily., Disp: 2 pen, Rfl: 3 .  metFORMIN (GLUCOPHAGE) 1000 MG tablet, TAKE 1 TABLET BY MOUTH TWICE DAILY WITH A MEAL, Disp: 180 tablet, Rfl: 3 .  polyethylene glycol (MIRALAX / GLYCOLAX) packet, Take 17 g by mouth as needed for mild constipation or moderate constipation. , Disp: , Rfl:   Social History   Tobacco Use  Smoking Status Never Smoker  Smokeless Tobacco Never Used    Allergies  Allergen Reactions  . Ace Inhibitors Swelling    Subglottic edema requiring  intubation for respiratory failure   Objective:   Vitals:   05/15/19 1032  Temp: (!) 97.3 F (36.3 C)   There is no height or weight on file to calculate BMI. Constitutional Well developed. Well nourished.  Vascular Dorsalis pedis pulses palpable bilaterally. Posterior tibial pulses palpable bilaterally. Capillary refill normal to all digits.  No cyanosis or clubbing noted. Pedal hair growth normal.  Neurologic Normal speech. Oriented to person, place, and time. Protective sensation absent  Dermatologic Wound lateral PIPJ fourth toe with probe to bone, edema, no significant warmth or erythema. No purulence expressible.  Orthopedic: No pain to palpation either foot.  Hammertoe formation lesser digits right foot   Radiographs: erosion noted proximal phalanx Assessment:   1. Diabetic ulcer of toe of right foot associated with diabetes mellitus due to underlying condition, limited to breakdown of skin (HCC)   2. Pre-procedural examination   3. Osteomyelitis of fourth toe of right foot (HCC)   4. Bone erosion   5. Hammertoe of right foot   6. Diabetic peripheral neuropathy associated with type 2 diabetes mellitus (HCC)    Plan:  Patient was evaluated and treated and all questions answered.  Ulcer right 4th toe -Repeat XR show new erosion of the bone -Discussed the patient that due to erosion of the bone normal treatment for this would be toe amputation.  Discussed with pat patient order stated oral laxatives will be assisted with ient possibility of salvage option including debridement of all nonviable bone possible packing antibiotic beads and wound closure.  Patient elects for salvage option.  Discussed she may still end up with amputation despite attempted salvage.  Patient verbalized understanding. -Consent form drawn for procedure.  Reviewed and signed by patient. -Patient has failed all conservative therapy and wishes to proceed with surgical intervention. All risks,  benefits, and alternatives discussed with patient. No guarantees given. Consent reviewed and signed by patient. -Planned procedures: Right fourth toe debridement of infected bone, phalangectomy, application of antibiotic delivery device, wound excision and closure.   No follow-ups on file.     

## 2019-05-20 NOTE — Telephone Encounter (Signed)
-----   Message from Cleon Gustin sent at 05/18/2019 11:52 AM EDT ----- Regarding: Reschedule Surgery Date Dr. March Rummage, patient walked into the office saying she is scheduled for surgery this Thursday, 16 July. States due to her job, she wants to know if it can be rescheduled to Wednesday, 22 July. Surgery is scheduled at [facility].

## 2019-05-20 NOTE — Progress Notes (Signed)
Pt stated that she had a stress test >5 years ago.

## 2019-05-20 NOTE — Anesthesia Preprocedure Evaluation (Addendum)
Anesthesia Evaluation  Patient identified by MRN, date of birth, ID band Patient awake    Reviewed: Allergy & Precautions, H&P , NPO status , Patient's Chart, lab work & pertinent test results, reviewed documented beta blocker date and time   Airway Mallampati: II  TM Distance: >3 FB Neck ROM: Full    Dental no notable dental hx. (+) Teeth Intact, Dental Advisory Given   Pulmonary sleep apnea and Continuous Positive Airway Pressure Ventilation , Current Smoker,    Pulmonary exam normal breath sounds clear to auscultation       Cardiovascular Exercise Tolerance: Good hypertension, Pt. on medications and Pt. on home beta blockers + CAD, + Past MI, + Cardiac Stents and +CHF   Rhythm:Regular Rate:Normal     Neuro/Psych Depression negative neurological ROS     GI/Hepatic Neg liver ROS, GERD  ,  Endo/Other  diabetes, Insulin Dependent, Oral Hypoglycemic AgentsMorbid obesity  Renal/GU negative Renal ROS  negative genitourinary   Musculoskeletal  (+) Arthritis , Osteoarthritis,    Abdominal   Peds  Hematology  (+) Blood dyscrasia, anemia ,   Anesthesia Other Findings   Reproductive/Obstetrics negative OB ROS                           Anesthesia Physical Anesthesia Plan  ASA: III  Anesthesia Plan: MAC   Post-op Pain Management:    Induction: Intravenous  PONV Risk Score and Plan: 2 and Propofol infusion, Midazolam and Ondansetron  Airway Management Planned: Simple Face Mask  Additional Equipment:   Intra-op Plan:   Post-operative Plan:   Informed Consent: I have reviewed the patients History and Physical, chart, labs and discussed the procedure including the risks, benefits and alternatives for the proposed anesthesia with the patient or authorized representative who has indicated his/her understanding and acceptance.     Dental advisory given  Plan Discussed with:  CRNA  Anesthesia Plan Comments: (See PAT note written 05/20/2019 by Myra Gianotti, PA-C. SAME DAY WORK-UP. She needs labs and EKG on arrival.   Primary care input by Matilde Haymaker, MD on 05/18/19: "Encounter for pre-operative cardiovascular clearance According to the [facility] calculator, her risk of MI in the perioperative setting is 0.1%. Given her history of diabetes, NSTEMI, heart failure, OSA, obesity, smoking I would put her in the low to medium risk category. Prior to surgery, have recommended that she stop smoking entirely. I see no additional areas for modification of risk factors prior to surgery. I also recommend that anesthesiology consider local anesthesia in place of general anesthesia for this patient. Ultimately, I would defer to anesthesiology's decision. She has mentioned bilaterallower extremity edema in the past month without shortness of breath, chest pain, she is able to sleep flat. I have low suspicion for decompensated heart failure at this time. I have recommended compression socks to help with lower extremity swelling.  -Smoking cessation prior to surgery -Low-medium risk for a low risk surgery".)      Anesthesia Quick Evaluation

## 2019-05-20 NOTE — Telephone Encounter (Signed)
I am returning your call.  You want to reschedule your surgery that's scheduled for Thursday, July 16 with Dr. March Rummage to next Thursday, July 22?  "No, I'm scheduled to have it done tomorrow."  Yes, you called and said you wanted to reschedule it.  "Well, since it sounded like there was a since of urgency, I decided to keep it schedule as it is."  Okay, that's great.  Take care.

## 2019-05-21 ENCOUNTER — Ambulatory Visit (HOSPITAL_COMMUNITY): Payer: BC Managed Care – PPO | Admitting: Vascular Surgery

## 2019-05-21 ENCOUNTER — Ambulatory Visit (HOSPITAL_COMMUNITY)
Admission: RE | Admit: 2019-05-21 | Discharge: 2019-05-21 | Disposition: A | Discharge status: Home / Self Care | Payer: BC Managed Care – PPO | Attending: Podiatry | Admitting: Podiatry

## 2019-05-21 ENCOUNTER — Ambulatory Visit (HOSPITAL_COMMUNITY): Payer: BC Managed Care – PPO

## 2019-05-21 ENCOUNTER — Encounter (HOSPITAL_COMMUNITY)
Admission: RE | Disposition: A | Discharge status: Home / Self Care | Payer: Self-pay | Source: Home / Self Care | Attending: Podiatry

## 2019-05-21 ENCOUNTER — Other Ambulatory Visit: Payer: Self-pay

## 2019-05-21 ENCOUNTER — Encounter (HOSPITAL_COMMUNITY): Payer: Self-pay

## 2019-05-21 DIAGNOSIS — Z794 Long term (current) use of insulin: Secondary | ICD-10-CM | POA: Insufficient documentation

## 2019-05-21 DIAGNOSIS — I5082 Biventricular heart failure: Secondary | ICD-10-CM | POA: Diagnosis not present

## 2019-05-21 DIAGNOSIS — Z79899 Other long term (current) drug therapy: Secondary | ICD-10-CM | POA: Diagnosis not present

## 2019-05-21 DIAGNOSIS — G4733 Obstructive sleep apnea (adult) (pediatric): Secondary | ICD-10-CM | POA: Diagnosis not present

## 2019-05-21 DIAGNOSIS — E1169 Type 2 diabetes mellitus with other specified complication: Secondary | ICD-10-CM | POA: Diagnosis not present

## 2019-05-21 DIAGNOSIS — I2510 Atherosclerotic heart disease of native coronary artery without angina pectoris: Secondary | ICD-10-CM | POA: Insufficient documentation

## 2019-05-21 DIAGNOSIS — D649 Anemia, unspecified: Secondary | ICD-10-CM | POA: Insufficient documentation

## 2019-05-21 DIAGNOSIS — L97514 Non-pressure chronic ulcer of other part of right foot with necrosis of bone: Secondary | ICD-10-CM | POA: Diagnosis not present

## 2019-05-21 DIAGNOSIS — D759 Disease of blood and blood-forming organs, unspecified: Secondary | ICD-10-CM | POA: Diagnosis not present

## 2019-05-21 DIAGNOSIS — I252 Old myocardial infarction: Secondary | ICD-10-CM | POA: Diagnosis not present

## 2019-05-21 DIAGNOSIS — Z8614 Personal history of Methicillin resistant Staphylococcus aureus infection: Secondary | ICD-10-CM | POA: Diagnosis not present

## 2019-05-21 DIAGNOSIS — M2041 Other hammer toe(s) (acquired), right foot: Secondary | ICD-10-CM | POA: Insufficient documentation

## 2019-05-21 DIAGNOSIS — M1990 Unspecified osteoarthritis, unspecified site: Secondary | ICD-10-CM | POA: Insufficient documentation

## 2019-05-21 DIAGNOSIS — I110 Hypertensive heart disease with heart failure: Secondary | ICD-10-CM | POA: Insufficient documentation

## 2019-05-21 DIAGNOSIS — E1142 Type 2 diabetes mellitus with diabetic polyneuropathy: Secondary | ICD-10-CM | POA: Diagnosis not present

## 2019-05-21 DIAGNOSIS — E08621 Diabetes mellitus due to underlying condition with foot ulcer: Secondary | ICD-10-CM | POA: Insufficient documentation

## 2019-05-21 DIAGNOSIS — E669 Obesity, unspecified: Secondary | ICD-10-CM | POA: Diagnosis not present

## 2019-05-21 DIAGNOSIS — M7731 Calcaneal spur, right foot: Secondary | ICD-10-CM | POA: Diagnosis not present

## 2019-05-21 DIAGNOSIS — M869 Osteomyelitis, unspecified: Secondary | ICD-10-CM | POA: Diagnosis not present

## 2019-05-21 DIAGNOSIS — L97511 Non-pressure chronic ulcer of other part of right foot limited to breakdown of skin: Secondary | ICD-10-CM | POA: Diagnosis not present

## 2019-05-21 DIAGNOSIS — Z6841 Body Mass Index (BMI) 40.0 and over, adult: Secondary | ICD-10-CM | POA: Insufficient documentation

## 2019-05-21 DIAGNOSIS — E11621 Type 2 diabetes mellitus with foot ulcer: Secondary | ICD-10-CM

## 2019-05-21 DIAGNOSIS — I509 Heart failure, unspecified: Secondary | ICD-10-CM | POA: Diagnosis not present

## 2019-05-21 DIAGNOSIS — M86171 Other acute osteomyelitis, right ankle and foot: Secondary | ICD-10-CM | POA: Diagnosis not present

## 2019-05-21 DIAGNOSIS — Z9889 Other specified postprocedural states: Secondary | ICD-10-CM

## 2019-05-21 HISTORY — PX: IRRIGATION AND DEBRIDEMENT FOOT: SHX6602

## 2019-05-21 HISTORY — PX: BONE BIOPSY: SHX375

## 2019-05-21 HISTORY — DX: Pneumonia, unspecified organism: J18.9

## 2019-05-21 LAB — CBC
HCT: 38.9 % (ref 36.0–46.0)
Hemoglobin: 11.5 g/dL — ABNORMAL LOW (ref 12.0–15.0)
MCH: 25.3 pg — ABNORMAL LOW (ref 26.0–34.0)
MCHC: 29.6 g/dL — ABNORMAL LOW (ref 30.0–36.0)
MCV: 85.5 fL (ref 80.0–100.0)
Platelets: 191 10*3/uL (ref 150–400)
RBC: 4.55 MIL/uL (ref 3.87–5.11)
RDW: 16.7 % — ABNORMAL HIGH (ref 11.5–15.5)
WBC: 8.3 10*3/uL (ref 4.0–10.5)
nRBC: 0 % (ref 0.0–0.2)

## 2019-05-21 LAB — BASIC METABOLIC PANEL
Anion gap: 16 — ABNORMAL HIGH (ref 5–15)
BUN: 14 mg/dL (ref 6–20)
CO2: 24 mmol/L (ref 22–32)
Calcium: 9.5 mg/dL (ref 8.9–10.3)
Chloride: 100 mmol/L (ref 98–111)
Creatinine, Ser: 0.86 mg/dL (ref 0.44–1.00)
GFR calc Af Amer: >60 mL/min (ref >60)
GFR calc non Af Amer: >60 mL/min (ref >60)
Glucose, Bld: 102 mg/dL — ABNORMAL HIGH (ref 70–99)
Potassium: 3.2 mmol/L — ABNORMAL LOW (ref 3.5–5.1)
Sodium: 140 mmol/L (ref 135–145)

## 2019-05-21 LAB — GLUCOSE, CAPILLARY
Glucose-Capillary: 122 mg/dL — ABNORMAL HIGH (ref 70–99)
Glucose-Capillary: 94 mg/dL (ref 70–99)

## 2019-05-21 SURGERY — IRRIGATION AND DEBRIDEMENT FOOT
Anesthesia: Monitor Anesthesia Care | Site: Fourth Toe | Laterality: Right

## 2019-05-21 MED ORDER — VANCOMYCIN HCL 1000 MG IV SOLR
INTRAVENOUS | Status: DC | PRN
  Administered 2019-05-21: 1000 mg via TOPICAL

## 2019-05-21 MED ORDER — VANCOMYCIN HCL 1000 MG IV SOLR
INTRAVENOUS | Status: AC
  Filled 2019-05-21: qty 1000

## 2019-05-21 MED ORDER — OXYCODONE-ACETAMINOPHEN 5-325 MG PO TABS: 1.0000 | ORAL_TABLET | ORAL | 0 refills | Status: DC | PRN

## 2019-05-21 MED ORDER — PROPOFOL 10 MG/ML IV BOLUS
INTRAVENOUS | Status: AC
  Filled 2019-05-21: qty 20

## 2019-05-21 MED ORDER — SODIUM CHLORIDE (PF) 0.9 % IJ SOLN
INTRAMUSCULAR | Status: AC
  Filled 2019-05-21: qty 10

## 2019-05-21 MED ORDER — MIDAZOLAM HCL 5 MG/5ML IJ SOLN
INTRAMUSCULAR | Status: DC | PRN
  Administered 2019-05-21: 2 mg via INTRAVENOUS

## 2019-05-21 MED ORDER — PROPOFOL 10 MG/ML IV BOLUS
INTRAVENOUS | Status: DC | PRN
  Administered 2019-05-21: 50 mg via INTRAVENOUS

## 2019-05-21 MED ORDER — PROPOFOL 1000 MG/100ML IV EMUL
INTRAVENOUS | Status: AC
  Filled 2019-05-21: qty 100

## 2019-05-21 MED ORDER — SODIUM CHLORIDE 0.9 % IR SOLN
Status: DC | PRN
  Administered 2019-05-21: 1

## 2019-05-21 MED ORDER — HYDROMORPHONE HCL 1 MG/ML IJ SOLN: 0.2500 mg | INTRAMUSCULAR | Status: DC | PRN

## 2019-05-21 MED ORDER — LIDOCAINE 2% (20 MG/ML) 5 ML SYRINGE
INTRAMUSCULAR | Status: AC
  Filled 2019-05-21: qty 5

## 2019-05-21 MED ORDER — ACETAMINOPHEN 500 MG PO TABS
1000.0000 mg | ORAL_TABLET | Freq: Once | ORAL | Status: AC
  Administered 2019-05-21: 06:00:00 1000 mg via ORAL
  Filled 2019-05-21: qty 2

## 2019-05-21 MED ORDER — CEFAZOLIN SODIUM 1 G IJ SOLR
INTRAMUSCULAR | Status: AC
  Filled 2019-05-21: qty 20

## 2019-05-21 MED ORDER — LACTATED RINGERS IV SOLN
INTRAVENOUS | Status: DC | PRN
  Administered 2019-05-21: 07:00:00 via INTRAVENOUS

## 2019-05-21 MED ORDER — BUPIVACAINE HCL (PF) 0.5 % IJ SOLN
INTRAMUSCULAR | Status: AC
  Filled 2019-05-21: qty 30

## 2019-05-21 MED ORDER — BUPIVACAINE HCL (PF) 0.5 % IJ SOLN
INTRAMUSCULAR | Status: DC | PRN
  Administered 2019-05-21: 10 mL

## 2019-05-21 MED ORDER — FENTANYL CITRATE (PF) 100 MCG/2ML IJ SOLN
INTRAMUSCULAR | Status: DC | PRN
  Administered 2019-05-21 (×2): 25 ug via INTRAVENOUS

## 2019-05-21 MED ORDER — ONDANSETRON HCL 4 MG/2ML IJ SOLN
INTRAMUSCULAR | Status: AC
  Filled 2019-05-21: qty 2

## 2019-05-21 MED ORDER — LIDOCAINE HCL (CARDIAC) PF 100 MG/5ML IV SOSY
PREFILLED_SYRINGE | INTRAVENOUS | Status: DC | PRN
  Administered 2019-05-21: 80 mg via INTRAVENOUS

## 2019-05-21 MED ORDER — CLINDAMYCIN HCL 300 MG PO CAPS: 300.0000 mg | ORAL_CAPSULE | Freq: Two times a day (BID) | ORAL | 0 refills | Status: AC

## 2019-05-21 MED ORDER — FENTANYL CITRATE (PF) 250 MCG/5ML IJ SOLN
INTRAMUSCULAR | Status: AC
  Filled 2019-05-21: qty 5

## 2019-05-21 MED ORDER — MIDAZOLAM HCL 2 MG/2ML IJ SOLN
INTRAMUSCULAR | Status: AC
  Filled 2019-05-21: qty 2

## 2019-05-21 MED ORDER — CEFAZOLIN SODIUM-DEXTROSE 2-3 GM-%(50ML) IV SOLR
INTRAVENOUS | Status: DC | PRN
  Administered 2019-05-21: 2 g via INTRAVENOUS

## 2019-05-21 MED ORDER — PROPOFOL 500 MG/50ML IV EMUL
INTRAVENOUS | Status: DC | PRN
  Administered 2019-05-21: 50 ug/kg/min via INTRAVENOUS

## 2019-05-21 MED ORDER — ONDANSETRON HCL 4 MG/2ML IJ SOLN
INTRAMUSCULAR | Status: DC | PRN
  Administered 2019-05-21: 4 mg via INTRAVENOUS

## 2019-05-21 SURGICAL SUPPLY — 51 items
APL PRP STRL LF DISP 70% ISPRP (MISCELLANEOUS) ×1
BLADE OSCILLATING/SAGITTAL (BLADE) ×3
BLADE SW THK.38XMED NAR THN (BLADE) IMPLANT
BNDG CMPR 9X4 STRL LF SNTH (GAUZE/BANDAGES/DRESSINGS)
BNDG ELASTIC 4X5.8 VLCR STR LF (GAUZE/BANDAGES/DRESSINGS) ×2 IMPLANT
BNDG ESMARK 4X9 LF (GAUZE/BANDAGES/DRESSINGS) IMPLANT
BNDG GAUZE ELAST 4 BULKY (GAUZE/BANDAGES/DRESSINGS) ×2 IMPLANT
CHLORAPREP W/TINT 26 (MISCELLANEOUS) ×3 IMPLANT
COVER SURGICAL LIGHT HANDLE (MISCELLANEOUS) ×3 IMPLANT
COVER WAND RF STERILE (DRAPES) ×3 IMPLANT
CUFF TOURN SGL QUICK 18X4 (TOURNIQUET CUFF) IMPLANT
CUFF TOURN SGL QUICK 34 (TOURNIQUET CUFF)
CUFF TRNQT CYL 34X4.125X (TOURNIQUET CUFF) IMPLANT
DRAIN PENROSE 1/2X12 LTX STRL (WOUND CARE) ×2 IMPLANT
DRAPE OEC MINIVIEW 54X84 (DRAPES) ×2 IMPLANT
DRAPE U-SHAPE 47X51 STRL (DRAPES) ×3 IMPLANT
ELECT CAUTERY BLADE 6.4 (BLADE) ×3 IMPLANT
ELECT REM PT RETURN 9FT ADLT (ELECTROSURGICAL) ×3
ELECTRODE REM PT RTRN 9FT ADLT (ELECTROSURGICAL) ×1 IMPLANT
GAUZE SPONGE 4X4 12PLY STRL (GAUZE/BANDAGES/DRESSINGS) ×2 IMPLANT
GAUZE XEROFORM 1X8 LF (GAUZE/BANDAGES/DRESSINGS) ×2 IMPLANT
GLOVE BIO SURGEON STRL SZ7.5 (GLOVE) ×3 IMPLANT
GLOVE BIOGEL PI IND STRL 8 (GLOVE) ×1 IMPLANT
GLOVE BIOGEL PI INDICATOR 8 (GLOVE) ×2
GOWN STRL REUS W/ TWL LRG LVL3 (GOWN DISPOSABLE) ×1 IMPLANT
GOWN STRL REUS W/ TWL XL LVL3 (GOWN DISPOSABLE) ×1 IMPLANT
GOWN STRL REUS W/TWL LRG LVL3 (GOWN DISPOSABLE) ×3
GOWN STRL REUS W/TWL XL LVL3 (GOWN DISPOSABLE) ×3
KIT BASIN OR (CUSTOM PROCEDURE TRAY) ×3 IMPLANT
KIT STIMULAN RAPID CURE  10CC (Orthopedic Implant) ×2 IMPLANT
KIT STIMULAN RAPID CURE 10CC (Orthopedic Implant) IMPLANT
KIT TURNOVER KIT B (KITS) ×3 IMPLANT
MANIFOLD NEPTUNE II (INSTRUMENTS) ×3 IMPLANT
NDL BIOPSY JAMSHIDI 8X6 (NEEDLE) IMPLANT
NDL HYPO 25GX1X1/2 BEV (NEEDLE) IMPLANT
NEEDLE BIOPSY JAMSHIDI 8X6 (NEEDLE) IMPLANT
NEEDLE HYPO 25GX1X1/2 BEV (NEEDLE) ×3 IMPLANT
NS IRRIG 1000ML POUR BTL (IV SOLUTION) ×3 IMPLANT
PACK ORTHO EXTREMITY (CUSTOM PROCEDURE TRAY) ×3 IMPLANT
PAD ARMBOARD 7.5X6 YLW CONV (MISCELLANEOUS) ×6 IMPLANT
PROBE DEBRIDE SONICVAC MISONIX (TIP) IMPLANT
SET CYSTO W/LG BORE CLAMP LF (SET/KITS/TRAYS/PACK) ×3 IMPLANT
SOL PREP POV-IOD 4OZ 10% (MISCELLANEOUS) ×6 IMPLANT
SUT ETHILON 4 0 FS 1 (SUTURE) ×2 IMPLANT
SUT VIC AB 3-0 PS2 18 (SUTURE) ×2 IMPLANT
SYR CONTROL 10ML LL (SYRINGE) ×2 IMPLANT
TOWEL GREEN STERILE (TOWEL DISPOSABLE) ×3 IMPLANT
TOWEL GREEN STERILE FF (TOWEL DISPOSABLE) ×3 IMPLANT
TUBE CONNECTING 12'X1/4 (SUCTIONS) ×1
TUBE CONNECTING 12X1/4 (SUCTIONS) ×2 IMPLANT
YANKAUER SUCT BULB TIP NO VENT (SUCTIONS) ×3 IMPLANT

## 2019-05-21 NOTE — Progress Notes (Signed)
Orthopedic Tech Progress Note Patient Details:  [name] Nov 22, 1965 [id number]  Ortho Devices Type of Ortho Device: Prafo boot/shoe Ortho Device/Splint Location: right Ortho Device/Splint Interventions: Application   Post Interventions Patient Tolerated: Well Instructions Provided: Care of device   [place] Pink 05/21/2019, 9:15 AM

## 2019-05-21 NOTE — Transfer of Care (Signed)
Immediate Anesthesia Transfer of Care Note  Patient: [name]  Procedure(s) Performed: Right fourth toe debridement and removal of infected bone (Right Fourth Toe) Bone Biopsy Middle Phalanx, Bone Biospy Proximal Phalanx with placement of antbiotic delivery device (Right Fourth Toe)  Patient Location: PACU  Anesthesia Type:MAC  Level of Consciousness: awake, alert  and oriented  Airway & Oxygen Therapy: Patient Spontanous Breathing and Patient connected to face mask oxygen  Post-op Assessment: Report given to RN and Post -op Vital signs reviewed and stable  Post vital signs: Reviewed and stable  Last Vitals:  Vitals Value Taken Time  BP 121/69 05/21/19 0825  Temp    Pulse 69 05/21/19 0826  Resp 19 05/21/19 0826  SpO2 91 % 05/21/19 0826  Vitals shown include unvalidated device data.  Last Pain:  Vitals:   05/21/19 0619  PainSc: 0-No pain      Patients Stated Pain Goal: 4 (05/26/56 5051)  Complications: No apparent anesthesia complications

## 2019-05-21 NOTE — Op Note (Signed)
Patient Name: [name] DOB: June 11, 1966  MRN: [id number]   Date of Service: 05/21/2019  Surgeon: Dr. Hardie Pulley, DPM Assistants: None Pre-operative Diagnosis:  Pre-Op Diagnosis Codes:    * Diabetic ulcer of toe of right foot associated with diabetes mellitus due to underlying condition, limited to breakdown of skin ([place]) [A63.016, L97.511]    * Osteomyelitis of fourth toe of right foot ([place]) [M86.9] Post-operative Diagnosis:  Post-Op Diagnosis Codes:    * Diabetic ulcer of toe of right foot associated with diabetes mellitus due to underlying condition, limited to breakdown of skin ([place]) [W10.932, L97.511]    * Osteomyelitis of fourth toe of right foot ([place]) [M86.9] Procedures:  1) Resection Proximal Phalanx for Osteo  2) Resection Middle Phalanx for Osteo  3) Bone Biopsy Proximal Phalanx  4) Bone Biopsy Middle Phalanx  5) Insertion of Antibiotic Beads  6) Excision of Wound with intermediate closure Pathology/Specimens: ID Type Source Tests Collected by Time Destination  1 : Right 4th Toe Bone from proximal phalanx Tissue Other SURGICAL PATHOLOGY Evelina Bucy, DPM 05/21/2019 0757   2 : Right 4th Toe Bone from middle phalanx Tissue Other SURGICAL PATHOLOGY Evelina Bucy, DPM 05/21/2019 0802   A : Right 4th Toe Soft Tissue Tissue Other AEROBIC/ANAEROBIC CULTURE (SURGICAL/DEEP WOUND) Evelina Bucy, DPM 05/21/2019 0752   B : Right 4th Toe Bone  Tissue Other AEROBIC/ANAEROBIC CULTURE (SURGICAL/DEEP WOUND) Evelina Bucy, DPM 05/21/2019 0756    Anesthesia: MAC/local Hemostasis: * No tourniquets in log * Estimated Blood Loss: 5 mL Materials:  Implant Name Type Inv. Item Serial No. Manufacturer Lot No. LRB No. Used Action  KIT STIMULAN RAPID CURE  10CC - [id number] Orthopedic Implant KIT STIMULAN RAPID CURE  10CC  [employer] [id number] Right 1 Implanted   Medications: 1g Vancomycin powder Complications: none  Indications for Procedure:  This is a 53 y.o. female with a  chronic ulcer to the right fourth toe.  She recently developed signs of osteomyelitis the digit.  We discussed that normal treatment for this would be amputation of the digit however we discussed possible salvage procedure.  Patient elected proceed with salvage option.  No guarantees were given.  Risks of sensory disorder were discussed.  Patient elected to proceed with resection of infected bone pack of antibiotic beads and excision of the wound.   Procedure in Detail: Patient was identified in pre-operative holding area. Formal consent was signed and the right lower extremity was marked. Patient was brought back to the operating room. Anesthesia was induced. The extremity was prepped and draped in the usual sterile fashion. Timeout was taken to confirm patient name, laterality, and procedure prior to incision.   Attention was then directed to the right fourth toe.  A dorsal incision was made on the toe and dissection was carried down through skin and subcu tissue with care to avoid all vital neurovascular structures.  Dissection was carried down to level the proximal phalangeal joint a transverse incision was made over the proximal phalangeal joint.  Collateral ligaments were freed to expose both the proximal and middle phalanges.  Attention was then directed to the lateral aspect of the foot where there was a wound noted about the lateral aspect of the PIPJ that probed to both the proximal and middle phalanx phalanges.  A rondure was used to biopsy both the proximal and middle phalanx for histopathology.  A sagittal saw was then used to resect both the proximal and middle phalanx.  Resection was  confirmed on fluoroscopy.  Attention was then directed to the lateral aspect of the fourth toe there is a wound measuring 0.5 x 05.  The wound was ellipsed out and sharply excised.  The skin was then sutured with 4-0 nylon.   Attention was then directed dorsally and the deficit after resection of the bone  was packed with stimulant beads that had been impregnated with vancomycin.  The capsule was then closed over the beads with 3-0 Vicryl. The remaining dorsal incision was then closed with 4-0 nylon running mattress suture with a central simple suture for reinforcement.  The foot was then dressed with Xeroform 4 x 4 Kerlix and Ace bandage. Patient tolerated the procedure well.   Disposition: Following a period of post-operative monitoring, patient will be transferred home.

## 2019-05-21 NOTE — Anesthesia Postprocedure Evaluation (Signed)
Anesthesia Post Note  Patient: KENIAH KLEMMER  Procedure(s) Performed: Right fourth toe debridement and removal of infected bone (Right Fourth Toe) Bone Biopsy Middle Phalanx, Bone Biospy Proximal Phalanx with placement of antbiotic delivery device (Right Fourth Toe)     Patient location during evaluation: PACU Anesthesia Type: MAC Level of consciousness: awake and alert Pain management: pain level controlled Vital Signs Assessment: post-procedure vital signs reviewed and stable Respiratory status: spontaneous breathing, nonlabored ventilation and respiratory function stable Cardiovascular status: stable and blood pressure returned to baseline Postop Assessment: no apparent nausea or vomiting Anesthetic complications: no    Last Vitals:  Vitals:   05/21/19 0859 05/21/19 0900  BP:    Pulse: 75 72  Resp: 18 16  Temp: 36.5 C   SpO2: 94% 92%    Last Pain:  Vitals:   05/21/19 0859  PainSc: 0-No pain                 Redford Behrle,W. EDMOND

## 2019-05-21 NOTE — Interval H&P Note (Signed)
History and Physical Interval Note:  05/21/2019 7:16 AM  [name]  has presented today for surgery, with the diagnosis of Right 4th toe wound, osteomyelitis.  The various methods of treatment have been discussed with the patient and family. After consideration of risks, benefits and other options for treatment, the patient has consented to  Procedure(s): Right fourth toe debridement and removal of infected bone (Right) Bone Biopsy with placement of antbiotic delivery device (Right) as a surgical intervention.  The patient's history has been reviewed, patient examined, no change in status, stable for surgery.  I have reviewed the patient's chart and labs.  Questions were answered to the patient's satisfaction.     Evelina Bucy

## 2019-05-22 ENCOUNTER — Encounter (HOSPITAL_COMMUNITY): Payer: Self-pay | Admitting: Podiatry

## 2019-05-24 ENCOUNTER — Other Ambulatory Visit: Payer: Self-pay | Admitting: Podiatry

## 2019-05-24 ENCOUNTER — Telehealth (INDEPENDENT_AMBULATORY_CARE_PROVIDER_SITE_OTHER): Payer: Self-pay | Admitting: Podiatry

## 2019-05-24 MED ORDER — IBUPROFEN 800 MG PO TABS: 800.0000 mg | ORAL_TABLET | Freq: Three times a day (TID) | ORAL | 0 refills | Status: DC | PRN

## 2019-05-24 MED ORDER — AMOXICILLIN-POT CLAVULANATE 875-125 MG PO TABS: 1.0000 | ORAL_TABLET | Freq: Two times a day (BID) | ORAL | 0 refills | Status: DC

## 2019-05-24 NOTE — Telephone Encounter (Signed)
Called patient to discuss culture results. Will switch to Augmentin given Clindamycin resistance for MSSA and GBS. Patient states she is taking two abx at present does not remember the other one; per records only one was sent - confirmed on AVS. Not sure what the other is but advised patient at this time only to take the Augmentin. Advised on taking probiotic given the recent multiple abx use. Pt verbalized understanding.  Patient additionally requested Rx for Ibuprofen 800 mg. Sent to pharmacy.

## 2019-05-26 LAB — AEROBIC/ANAEROBIC CULTURE W GRAM STAIN (SURGICAL/DEEP WOUND)

## 2019-05-28 ENCOUNTER — Other Ambulatory Visit: Payer: Self-pay

## 2019-05-28 ENCOUNTER — Ambulatory Visit (INDEPENDENT_AMBULATORY_CARE_PROVIDER_SITE_OTHER): Payer: BC Managed Care – PPO

## 2019-05-28 ENCOUNTER — Ambulatory Visit (INDEPENDENT_AMBULATORY_CARE_PROVIDER_SITE_OTHER): Payer: Self-pay | Admitting: Podiatry

## 2019-05-28 VITALS — Temp 97.6°F

## 2019-05-28 DIAGNOSIS — Z09 Encounter for follow-up examination after completed treatment for conditions other than malignant neoplasm: Secondary | ICD-10-CM

## 2019-05-28 DIAGNOSIS — L97511 Non-pressure chronic ulcer of other part of right foot limited to breakdown of skin: Secondary | ICD-10-CM

## 2019-05-28 DIAGNOSIS — E08621 Diabetes mellitus due to underlying condition with foot ulcer: Secondary | ICD-10-CM

## 2019-05-28 NOTE — Progress Notes (Signed)
Subjective:  Patient ID: [name], female    DOB: [birth year],  MRN: [id number]  Chief Complaint  Patient presents with  . Routine Post Op    1 DOS 05/21/2019 EXC. BENIGN LESION, INSERTION ANTIBIOTIC DEVICE, PARTIAL RESECTION PHALANX, AND ADJACENT TISSUE TRANSFER RT    DOS: 05/21/2019 Procedure: Partial Resection of Phalanx, Excision of wound with closure right 4th toe.  53 y.o. female returns for post-op check. Doing well denies pain. Taking the Augmentin as prescribed.  Review of Systems: Negative except as noted in the HPI. Denies N/V/F/Ch.  Past Medical History:  Diagnosis Date  . Abnormal bruising 06/18/2016  . ARF (acute respiratory failure) ([place]) 02/10/2016  . CAD (coronary artery disease) 02/10/2016   a. 03/14/2016 Staged PCI with DES to mid RCA. b. NSTEMI 02/2016: DES to mid-LAD, POBA to 1st Diag, 85% stenosis of RCA noted with staged PCI recommended  . Chronic combined systolic (congestive) and diastolic (congestive) heart failure ([place])    a. Echo 02/2016: EF 40-45% w/ Grade 2 DD. 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  Marland Kitchen Dysphagia 03/09/2016   Referred to GI [place] 03/09/2016    . Dyspnea 03/09/2016   Off ACEi since 02/19/16 -Spirometry 03/09/2016  No airflow obst     . Fall 03/01/2016  . Flash pulmonary edema ([place]) 02/10/2016   requiring intubation  . History of MRSA infection 11/05/2010   Skin abscesses  . History of myocardial infarction 02/04/2016  . Hypertension   . Hypokalemia 03/08/2017  . Leukocytosis 05/21/2016  . Meralgia paresthetica of right side 12/18/2013  . Microcytic anemia 05/18/2016  . Myocardial infarction ([place]) 02/2016  . NSTEMI (non-ST elevated myocardial infarction) ([place])   . Obesity (BMI 30-39.9) 04/03/2016  . Orthostatic hypotension 04/27/2014  . OSA (obstructive sleep apnea)   . OSA on CPAP   . Pneumonia   . Required emergent intubation 02/10/2016  . Stridor 02/19/2016  . Type II diabetes mellitus (HCC)     Current Outpatient  Medications:  .  albuterol (VENTOLIN HFA) 108 (90 Base) MCG/ACT inhaler, Inhale 1-2 puffs into the lungs every 4 (four) hours as needed for wheezing or shortness of breath., Disp: , Rfl:  .  amLODipine (NORVASC) 10 MG tablet, Take 1 tablet (10 mg total) by mouth daily., Disp: 60 tablet, Rfl: 5 .  amoxicillin-clavulanate (AUGMENTIN) 875-125 MG tablet, Take 1 tablet by mouth 2 (two) times daily., Disp: 20 tablet, Rfl: 0 .  atorvastatin (LIPITOR) 40 MG tablet, TAKE 1 TABLET BY MOUTH ONCE DAILY, Disp: 30 tablet, Rfl: 11 .  Bayer Microlet Lancets lancets, Use as instructed, Disp: 100 each, Rfl: 12 .  blood glucose meter kit and supplies KIT, Check sugars three times a day, Disp: 1 each, Rfl: 0 .  Blood Glucose Monitoring Suppl (CONTOUR BLOOD GLUCOSE SYSTEM) DEVI, 1 Device by Does not apply route 3 (three) times daily., Disp: 100 strip, Rfl: PRN .  carvedilol (COREG) 12.5 MG tablet, Take 1 tablet (12.5 mg total) by mouth 2 (two) times daily with a meal., Disp: 180 tablet, Rfl: 3 .  clindamycin (CLEOCIN) 300 MG capsule, Take 1 capsule (300 mg total) by mouth 2 (two) times a day for 7 days., Disp: 14 capsule, Rfl: 0 .  gabapentin (NEURONTIN) 300 MG capsule, TAKE 3 CAPSULES BY MOUTH THREE TIMES DAILY, Disp: 270 capsule, Rfl: 1 .  glucose blood test strip, Use as instructed, Disp: 100 each, Rfl: 12 .  hydrochlorothiazide (MICROZIDE) 12.5 MG capsule, Take 1 capsule (12.5  mg total) by mouth daily., Disp: 90 capsule, Rfl: 3 .  ibuprofen (ADVIL) 800 MG tablet, Take 1 tablet (800 mg total) by mouth every 8 (eight) hours as needed for moderate pain., Disp: 30 tablet, Rfl: 0 .  insulin NPH Human (HUMULIN N,NOVOLIN N) 100 UNIT/ML injection, Inject 0.25 mLs (25 Units total) into the skin 2 (two) times daily before a meal., Disp: 10 mL, Rfl: 11 .  INSULIN SYRINGE 1CC/29G 29G X 1/2" 1 ML MISC, Use to inject insulin daily., Disp: 100 each, Rfl: 1 .  liraglutide (VICTOZA) 18 MG/3ML SOPN, Inject 0.2 mLs (1.2 mg total) into  the skin daily., Disp: 2 pen, Rfl: 3 .  metFORMIN (GLUCOPHAGE) 1000 MG tablet, TAKE 1 TABLET BY MOUTH TWICE DAILY WITH A MEAL, Disp: 180 tablet, Rfl: 3 .  oxyCODONE-acetaminophen (PERCOCET) 5-325 MG tablet, Take 1 tablet by mouth every 4 (four) hours as needed for severe pain., Disp: 20 tablet, Rfl: 0 .  polyethylene glycol (MIRALAX / GLYCOLAX) packet, Take 17 g by mouth as needed for mild constipation or moderate constipation. , Disp: , Rfl:   Social History   Tobacco Use  Smoking Status Never Smoker  Smokeless Tobacco Never Used    Allergies  Allergen Reactions  . Ace Inhibitors Swelling    Subglottic edema requiring intubation for respiratory failure   Objective:  There were no vitals filed for this visit. There is no height or weight on file to calculate BMI. Constitutional Well developed. Well nourished.  Vascular Foot warm and well perfused. Capillary refill normal to all digits.   Neurologic Normal speech. Oriented to person, place, and time. Epicritic sensation to light touch grossly present bilaterally.  Dermatologic Skin healing well without signs of infection. Toe still edematous but no warmth or erythema. Wound lateral 4th toe closed with intact sutures.  Orthopedic: Tenderness to palpation noted about the surgical site.   Radiographs: Taken and reviewed c/w post-op state. Assessment:   1. Diabetic ulcer of toe of right foot associated with diabetes mellitus due to underlying condition, limited to breakdown of skin ([place])   2. Surgery follow-up    Plan:  Patient was evaluated and treated and all questions answered.  S/p foot surgery right -Progressing as expected post-operatively. -XR: As above. -WB Status: WBAT in surgical shoe -Sutures: intact. -Medications: none refilled -Foot redressed. -Continue abx until completion.  No follow-ups on file.

## 2019-06-02 ENCOUNTER — Telehealth: Payer: Self-pay | Admitting: Podiatry

## 2019-06-02 MED ORDER — OXYCODONE-ACETAMINOPHEN 5-325 MG PO TABS: 1.0000 | ORAL_TABLET | ORAL | 0 refills | Status: DC | PRN

## 2019-06-02 NOTE — Telephone Encounter (Signed)
Pt is requesting refill on pain medication.

## 2019-06-02 NOTE — Telephone Encounter (Addendum)
Unable to discuss pt's pain characteristics voice mail is not set up twice.

## 2019-06-02 NOTE — Addendum Note (Signed)
Addended by: Hardie Pulley on: 06/02/2019 04:46 PM   Modules accepted: Orders

## 2019-06-03 DIAGNOSIS — M86171 Other acute osteomyelitis, right ankle and foot: Secondary | ICD-10-CM

## 2019-06-03 DIAGNOSIS — E11621 Type 2 diabetes mellitus with foot ulcer: Secondary | ICD-10-CM

## 2019-06-04 ENCOUNTER — Other Ambulatory Visit: Payer: Self-pay

## 2019-06-04 ENCOUNTER — Encounter: Payer: Self-pay | Admitting: Podiatry

## 2019-06-04 ENCOUNTER — Ambulatory Visit (INDEPENDENT_AMBULATORY_CARE_PROVIDER_SITE_OTHER): Payer: Self-pay | Admitting: Podiatry

## 2019-06-04 VITALS — Temp 98.3°F

## 2019-06-04 DIAGNOSIS — Z9889 Other specified postprocedural states: Secondary | ICD-10-CM

## 2019-06-04 DIAGNOSIS — E08621 Diabetes mellitus due to underlying condition with foot ulcer: Secondary | ICD-10-CM

## 2019-06-04 DIAGNOSIS — L97511 Non-pressure chronic ulcer of other part of right foot limited to breakdown of skin: Secondary | ICD-10-CM

## 2019-06-04 MED ORDER — AMOXICILLIN-POT CLAVULANATE 875-125 MG PO TABS: 1.0000 | ORAL_TABLET | Freq: Two times a day (BID) | ORAL | 0 refills | Status: DC

## 2019-06-07 NOTE — Progress Notes (Signed)
Subjective:  Patient ID: [name], female    DOB: April 25, 1966,  MRN: [id number]  Chief Complaint  Patient presents with  . Routine Post Op    DOS 05/21/2019 EXC. BENIGN LESION, INSERTION ANTIBIOTIC DEVICE, PARTIAL RESECTION PHALANX, AND ADJACENT TISSUE TRANSFER RT " my toe has been hurting a little bit more but I have also ben more active"    DOS: 05/21/2019 Procedure: Partial Resection of Phalanx, Excision of wound with closure right 4th toe.  53 y.o. female returns for post-op check.  History as above confirmed with patient having a little more pain in the toe but otherwise denies concerns.  Review of Systems: Negative except as noted in the HPI. Denies N/V/F/Ch.  Past Medical History:  Diagnosis Date  . Abnormal bruising 06/18/2016  . ARF (acute respiratory failure) ([place]) 02/10/2016  . CAD (coronary artery disease) 02/10/2016   a. 03/14/2016 Staged PCI with DES to mid RCA. b. NSTEMI 02/2016: DES to mid-LAD, POBA to 1st Diag, 85% stenosis of RCA noted with staged PCI recommended  . Chronic combined systolic (congestive) and diastolic (congestive) heart failure ([place])    a. Echo 02/2016: EF 40-45% w/ Grade 2 DD. 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  Marland Kitchen Dysphagia 03/09/2016   Referred to GI [place] 03/09/2016    . Dyspnea 03/09/2016   Off ACEi since 02/19/16 -Spirometry 03/09/2016  No airflow obst     . Fall 03/01/2016  . Flash pulmonary edema ([place]) 02/10/2016   requiring intubation  . History of MRSA infection 11/05/2010   Skin abscesses  . History of myocardial infarction 02/04/2016  . Hypertension   . Hypokalemia 03/08/2017  . Leukocytosis 05/21/2016  . Meralgia paresthetica of right side 12/18/2013  . Microcytic anemia 05/18/2016  . Myocardial infarction ([place]) 02/2016  . NSTEMI (non-ST elevated myocardial infarction) ([place])   . Obesity (BMI 30-39.9) 04/03/2016  . Orthostatic hypotension 04/27/2014  . OSA (obstructive sleep apnea)   . OSA on CPAP   . Pneumonia   .  Required emergent intubation 02/10/2016  . Stridor 02/19/2016  . Type II diabetes mellitus (HCC)     Current Outpatient Medications:  .  albuterol (VENTOLIN HFA) 108 (90 Base) MCG/ACT inhaler, Inhale 1-2 puffs into the lungs every 4 (four) hours as needed for wheezing or shortness of breath., Disp: , Rfl:  .  amLODipine (NORVASC) 10 MG tablet, Take 1 tablet (10 mg total) by mouth daily., Disp: 60 tablet, Rfl: 5 .  amoxicillin-clavulanate (AUGMENTIN) 875-125 MG tablet, Take 1 tablet by mouth 2 (two) times daily., Disp: 20 tablet, Rfl: 0 .  atorvastatin (LIPITOR) 40 MG tablet, TAKE 1 TABLET BY MOUTH ONCE DAILY, Disp: 30 tablet, Rfl: 11 .  Bayer Microlet Lancets lancets, Use as instructed, Disp: 100 each, Rfl: 12 .  blood glucose meter kit and supplies KIT, Check sugars three times a day, Disp: 1 each, Rfl: 0 .  Blood Glucose Monitoring Suppl (CONTOUR BLOOD GLUCOSE SYSTEM) DEVI, 1 Device by Does not apply route 3 (three) times daily., Disp: 100 strip, Rfl: PRN .  carvedilol (COREG) 12.5 MG tablet, Take 1 tablet (12.5 mg total) by mouth 2 (two) times daily with a meal., Disp: 180 tablet, Rfl: 3 .  gabapentin (NEURONTIN) 300 MG capsule, TAKE 3 CAPSULES BY MOUTH THREE TIMES DAILY, Disp: 270 capsule, Rfl: 1 .  glucose blood test strip, Use as instructed, Disp: 100 each, Rfl: 12 .  hydrochlorothiazide (MICROZIDE) 12.5 MG capsule, Take 1 capsule (12.5 mg total)  by mouth daily., Disp: 90 capsule, Rfl: 3 .  ibuprofen (ADVIL) 800 MG tablet, Take 1 tablet (800 mg total) by mouth every 8 (eight) hours as needed for moderate pain., Disp: 30 tablet, Rfl: 0 .  insulin NPH Human (HUMULIN N,NOVOLIN N) 100 UNIT/ML injection, Inject 0.25 mLs (25 Units total) into the skin 2 (two) times daily before a meal., Disp: 10 mL, Rfl: 11 .  INSULIN SYRINGE 1CC/29G 29G X 1/2" 1 ML MISC, Use to inject insulin daily., Disp: 100 each, Rfl: 1 .  liraglutide (VICTOZA) 18 MG/3ML SOPN, Inject 0.2 mLs (1.2 mg total) into the skin daily.,  Disp: 2 pen, Rfl: 3 .  metFORMIN (GLUCOPHAGE) 1000 MG tablet, TAKE 1 TABLET BY MOUTH TWICE DAILY WITH A MEAL, Disp: 180 tablet, Rfl: 3 .  oxyCODONE-acetaminophen (PERCOCET) 5-325 MG tablet, Take 1 tablet by mouth every 4 (four) hours as needed for severe pain., Disp: 20 tablet, Rfl: 0 .  polyethylene glycol (MIRALAX / GLYCOLAX) packet, Take 17 g by mouth as needed for mild constipation or moderate constipation. , Disp: , Rfl:   Social History   Tobacco Use  Smoking Status Never Smoker  Smokeless Tobacco Never Used    Allergies  Allergen Reactions  . Ace Inhibitors Swelling    Subglottic edema requiring intubation for respiratory failure   Objective:   Vitals:   06/04/19 1001  Temp: 98.3 F (36.8 C)   There is no height or weight on file to calculate BMI. Constitutional Well developed. Well nourished.  Vascular Foot warm and well perfused. Capillary refill normal to all digits.   Neurologic Normal speech. Oriented to person, place, and time. Epicritic sensation to light touch grossly present bilaterally.  Dermatologic Skin healing well without signs of infection. Toe still slightly edematous but no warmth or erythema wound healed lateral fourth toe.  Orthopedic:  No tenderness to palpation noted about the surgical site.   Radiographs: None today. Assessment:   1. Post-operative state   2. Diabetic ulcer of toe of right foot associated with diabetes mellitus due to underlying condition, limited to breakdown of skin [facility])    Plan:  Patient was evaluated and treated and all questions answered.  S/p foot surgery right -Progressing as expected post-operatively. -XR: As above. -WB Status: WBAT in surgical shoe -Sutures: intact. -Medications: none refilled -Foot redressed. -Refill antibiotic  Return in about 1 week (around 06/11/2019) for Suture removal.

## 2019-06-09 ENCOUNTER — Telehealth: Payer: Self-pay | Admitting: *Deleted

## 2019-06-09 ENCOUNTER — Telehealth: Payer: Self-pay | Admitting: Podiatry

## 2019-06-09 ENCOUNTER — Other Ambulatory Visit: Payer: Self-pay | Admitting: Podiatry

## 2019-06-09 MED ORDER — IBUPROFEN 800 MG PO TABS: 800.0000 mg | ORAL_TABLET | Freq: Three times a day (TID) | ORAL | 0 refills | Status: DC | PRN

## 2019-06-09 NOTE — Telephone Encounter (Signed)
Pt called for refill of mediation.

## 2019-06-09 NOTE — Telephone Encounter (Signed)
Unable to leave a message, pt's voicemail has not been set up.

## 2019-06-09 NOTE — Telephone Encounter (Signed)
Pt is calling about a refill on her medication/ she also had some questions about her sx foot. Please give patient a call

## 2019-06-09 NOTE — Progress Notes (Signed)
Patient woke up this morning and was concerned her toe was black. States she got to work had a coworker look at it and thinks it looks more bruised. States she can feel the toe and it is warm. Discussed with patient should the color worsen to call for immediate appt. Will f/u with patient Thursday.  Rxed Ibuprofen for day use of pain control at work per patient request

## 2019-06-09 NOTE — Telephone Encounter (Signed)
Dr March Rummage please advise pt has an appointment on Thursday   [facility])

## 2019-06-11 ENCOUNTER — Ambulatory Visit (INDEPENDENT_AMBULATORY_CARE_PROVIDER_SITE_OTHER): Payer: BC Managed Care – PPO

## 2019-06-11 ENCOUNTER — Ambulatory Visit (INDEPENDENT_AMBULATORY_CARE_PROVIDER_SITE_OTHER): Payer: BC Managed Care – PPO | Admitting: Podiatry

## 2019-06-11 ENCOUNTER — Other Ambulatory Visit: Payer: Self-pay

## 2019-06-11 DIAGNOSIS — S90121A Contusion of right lesser toe(s) without damage to nail, initial encounter: Secondary | ICD-10-CM

## 2019-06-11 NOTE — Progress Notes (Signed)
Subjective:  Patient ID: [name], female    DOB: 20-Jan-1966,  MRN: [id number]  Chief Complaint  Patient presents with  . Routine Post Op    POV#[id number] 05/21/2019 EXC. BENIGN LESION, INSERTION ANTIBIOTIC DEVICE, PARTIAL RESECTION PHALANX, AND ADJACENT TISSUE TRANSFER RT   DOS: 05/21/2019 Procedure: Partial Resection of Phalanx, Excision of wound with closure right 4th toe.  53 y.o. female returns for post-op check.  States that the right third toe was bruised she woke up and it was black was not sure what was happening to it had her supervisor look at it and she that was bruised.  Not having any pain in the fourth toe but is having some swelling.  Review of Systems: Negative except as noted in the HPI. Denies N/V/F/Ch.  Past Medical History:  Diagnosis Date  . Abnormal bruising 06/18/2016  . ARF (acute respiratory failure) ([place]) 02/10/2016  . CAD (coronary artery disease) 02/10/2016   a. 03/14/2016 Staged PCI with DES to mid RCA. b. NSTEMI 02/2016: DES to mid-LAD, POBA to 1st Diag, 85% stenosis of RCA noted with staged PCI recommended  . Chronic combined systolic (congestive) and diastolic (congestive) heart failure ([place])    a. Echo 02/2016: EF 40-45% w/ Grade 2 DD. 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  Marland Kitchen Dysphagia 03/09/2016   Referred to GI [place] 03/09/2016    . Dyspnea 03/09/2016   Off ACEi since 02/19/16 -Spirometry 03/09/2016  No airflow obst     . Fall 03/01/2016  . Flash pulmonary edema ([place]) 02/10/2016   requiring intubation  . History of MRSA infection 11/05/2010   Skin abscesses  . History of myocardial infarction 02/04/2016  . Hypertension   . Hypokalemia 03/08/2017  . Leukocytosis 05/21/2016  . Meralgia paresthetica of right side 12/18/2013  . Microcytic anemia 05/18/2016  . Myocardial infarction ([place]) 02/2016  . NSTEMI (non-ST elevated myocardial infarction) ([place])   . Obesity (BMI 30-39.9) 04/03/2016  . Orthostatic hypotension 04/27/2014  . OSA  (obstructive sleep apnea)   . OSA on CPAP   . Pneumonia   . Required emergent intubation 02/10/2016  . Stridor 02/19/2016  . Type II diabetes mellitus (HCC)     Current Outpatient Medications:  .  albuterol (VENTOLIN HFA) 108 (90 Base) MCG/ACT inhaler, Inhale 1-2 puffs into the lungs every 4 (four) hours as needed for wheezing or shortness of breath., Disp: , Rfl:  .  amLODipine (NORVASC) 10 MG tablet, Take 1 tablet (10 mg total) by mouth daily., Disp: 60 tablet, Rfl: 5 .  amoxicillin-clavulanate (AUGMENTIN) 875-125 MG tablet, Take 1 tablet by mouth 2 (two) times daily., Disp: 20 tablet, Rfl: 0 .  atorvastatin (LIPITOR) 40 MG tablet, TAKE 1 TABLET BY MOUTH ONCE DAILY, Disp: 30 tablet, Rfl: 11 .  Bayer Microlet Lancets lancets, Use as instructed, Disp: 100 each, Rfl: 12 .  blood glucose meter kit and supplies KIT, Check sugars three times a day, Disp: 1 each, Rfl: 0 .  Blood Glucose Monitoring Suppl (CONTOUR BLOOD GLUCOSE SYSTEM) DEVI, 1 Device by Does not apply route 3 (three) times daily., Disp: 100 strip, Rfl: PRN .  carvedilol (COREG) 12.5 MG tablet, Take 1 tablet (12.5 mg total) by mouth 2 (two) times daily with a meal., Disp: 180 tablet, Rfl: 3 .  gabapentin (NEURONTIN) 300 MG capsule, TAKE 3 CAPSULES BY MOUTH THREE TIMES DAILY, Disp: 270 capsule, Rfl: 1 .  glucose blood test strip, Use as instructed, Disp: 100 each, Rfl: 12 .  hydrochlorothiazide (MICROZIDE) 12.5 MG capsule, Take 1 capsule (12.5 mg total) by mouth daily., Disp: 90 capsule, Rfl: 3 .  ibuprofen (ADVIL) 800 MG tablet, Take 1 tablet (800 mg total) by mouth every 8 (eight) hours as needed for moderate pain., Disp: 30 tablet, Rfl: 0 .  insulin NPH Human (HUMULIN N,NOVOLIN N) 100 UNIT/ML injection, Inject 0.25 mLs (25 Units total) into the skin 2 (two) times daily before a meal., Disp: 10 mL, Rfl: 11 .  INSULIN SYRINGE 1CC/29G 29G X 1/2" 1 ML MISC, Use to inject insulin daily., Disp: 100 each, Rfl: 1 .  liraglutide (VICTOZA) 18  MG/3ML SOPN, Inject 0.2 mLs (1.2 mg total) into the skin daily., Disp: 2 pen, Rfl: 3 .  metFORMIN (GLUCOPHAGE) 1000 MG tablet, TAKE 1 TABLET BY MOUTH TWICE DAILY WITH A MEAL, Disp: 180 tablet, Rfl: 3 .  oxyCODONE-acetaminophen (PERCOCET) 5-325 MG tablet, Take 1 tablet by mouth every 4 (four) hours as needed for severe pain., Disp: 20 tablet, Rfl: 0 .  polyethylene glycol (MIRALAX / GLYCOLAX) packet, Take 17 g by mouth as needed for mild constipation or moderate constipation. , Disp: , Rfl:   Social History   Tobacco Use  Smoking Status Never Smoker  Smokeless Tobacco Never Used    Allergies  Allergen Reactions  . Ace Inhibitors Swelling    Subglottic edema requiring intubation for respiratory failure   Objective:   There were no vitals filed for this visit. There is no height or weight on file to calculate BMI. Constitutional Well developed. Well nourished.  Vascular Foot warm and well perfused. Capillary refill normal to all digits.   Neurologic Normal speech. Oriented to person, place, and time. Epicritic sensation to light touch grossly present bilaterally.  Dermatologic  skin well-healed.  Sutures intact.  Toe slightly edematous but no warmth or erythema.  Orthopedic:  No tenderness to palpation noted about the surgical site.  Tenderness to palpation about the third toe distal phalanx with contusion   Radiographs: None today. Assessment:   1. Contusion of third toe of right foot, initial encounter    Plan:  Patient was evaluated and treated and all questions answered.  S/p foot surgery right -Progressing as expected post-operatively. -XR: Taken and reviewed -WB Status: WBAT in surgical shoe.  Advised that in 1 week she can transition to normal shoe gear. -Sutures: Removed today.  Okay to shower but not soak -Medications: none refilled -Foot redressed. -No further refill on antibiotic -Discussed that the swelling in the toe may remain or may resolve with time.  The  antibiotic beads may resorb with time and this will likely reduce swelling  Contusion third toe right foot -X-rays taken possible fracture noted. -She is already immobilized in a surgical shoe -Continue surgical shoe for another week No follow-ups on file.

## 2019-06-19 ENCOUNTER — Other Ambulatory Visit: Payer: Self-pay

## 2019-06-19 ENCOUNTER — Encounter: Payer: Self-pay | Admitting: Podiatry

## 2019-06-19 ENCOUNTER — Ambulatory Visit (INDEPENDENT_AMBULATORY_CARE_PROVIDER_SITE_OTHER): Payer: Self-pay | Admitting: Podiatry

## 2019-06-19 DIAGNOSIS — Z9889 Other specified postprocedural states: Secondary | ICD-10-CM

## 2019-06-19 NOTE — Progress Notes (Signed)
Subjective:  Patient ID: [name], female    DOB: [birth year],  MRN: [id number]  No chief complaint on file.  DOS: 05/21/2019 Procedure: Partial Resection of Phalanx, Excision of wound with closure right 4th toe.  53 y.o. female returns for post-op check.  Doing very well but having swelling in the toe. Denies pain.   Review of Systems: Negative except as noted in the HPI. Denies N/V/F/Ch.  Past Medical History:  Diagnosis Date  . Abnormal bruising 06/18/2016  . ARF (acute respiratory failure) ([place]) 02/10/2016  . CAD (coronary artery disease) 02/10/2016   a. 03/14/2016 Staged PCI with DES to mid RCA. b. NSTEMI 02/2016: DES to mid-LAD, POBA to 1st Diag, 85% stenosis of RCA noted with staged PCI recommended  . Chronic combined systolic (congestive) and diastolic (congestive) heart failure ([place])    a. Echo 02/2016: EF 40-45% w/ Grade 2 DD. 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  Marland Kitchen Dysphagia 03/09/2016   Referred to GI [place] 03/09/2016    . Dyspnea 03/09/2016   Off ACEi since 02/19/16 -Spirometry 03/09/2016  No airflow obst     . Fall 03/01/2016  . Flash pulmonary edema ([place]) 02/10/2016   requiring intubation  . History of MRSA infection 11/05/2010   Skin abscesses  . History of myocardial infarction 02/04/2016  . Hypertension   . Hypokalemia 03/08/2017  . Leukocytosis 05/21/2016  . Meralgia paresthetica of right side 12/18/2013  . Microcytic anemia 05/18/2016  . Myocardial infarction ([place]) 02/2016  . NSTEMI (non-ST elevated myocardial infarction) ([place])   . Obesity (BMI 30-39.9) 04/03/2016  . Orthostatic hypotension 04/27/2014  . OSA (obstructive sleep apnea)   . OSA on CPAP   . Pneumonia   . Required emergent intubation 02/10/2016  . Stridor 02/19/2016  . Type II diabetes mellitus (HCC)     Current Outpatient Medications:  .  albuterol (VENTOLIN HFA) 108 (90 Base) MCG/ACT inhaler, Inhale 1-2 puffs into the lungs every 4 (four) hours as needed for wheezing or  shortness of breath., Disp: , Rfl:  .  amLODipine (NORVASC) 10 MG tablet, Take 1 tablet (10 mg total) by mouth daily., Disp: 60 tablet, Rfl: 5 .  amoxicillin-clavulanate (AUGMENTIN) 875-125 MG tablet, Take 1 tablet by mouth 2 (two) times daily., Disp: 20 tablet, Rfl: 0 .  atorvastatin (LIPITOR) 40 MG tablet, TAKE 1 TABLET BY MOUTH ONCE DAILY, Disp: 30 tablet, Rfl: 11 .  Bayer Microlet Lancets lancets, Use as instructed, Disp: 100 each, Rfl: 12 .  blood glucose meter kit and supplies KIT, Check sugars three times a day, Disp: 1 each, Rfl: 0 .  Blood Glucose Monitoring Suppl (CONTOUR BLOOD GLUCOSE SYSTEM) DEVI, 1 Device by Does not apply route 3 (three) times daily., Disp: 100 strip, Rfl: PRN .  carvedilol (COREG) 12.5 MG tablet, Take 1 tablet (12.5 mg total) by mouth 2 (two) times daily with a meal., Disp: 180 tablet, Rfl: 3 .  gabapentin (NEURONTIN) 300 MG capsule, TAKE 3 CAPSULES BY MOUTH THREE TIMES DAILY, Disp: 270 capsule, Rfl: 1 .  glucose blood test strip, Use as instructed, Disp: 100 each, Rfl: 12 .  hydrochlorothiazide (MICROZIDE) 12.5 MG capsule, Take 1 capsule (12.5 mg total) by mouth daily., Disp: 90 capsule, Rfl: 3 .  ibuprofen (ADVIL) 800 MG tablet, Take 1 tablet (800 mg total) by mouth every 8 (eight) hours as needed for moderate pain., Disp: 30 tablet, Rfl: 0 .  insulin NPH Human (HUMULIN N,NOVOLIN N) 100 UNIT/ML injection, Inject 0.25  mLs (25 Units total) into the skin 2 (two) times daily before a meal., Disp: 10 mL, Rfl: 11 .  INSULIN SYRINGE 1CC/29G 29G X 1/2" 1 ML MISC, Use to inject insulin daily., Disp: 100 each, Rfl: 1 .  liraglutide (VICTOZA) 18 MG/3ML SOPN, Inject 0.2 mLs (1.2 mg total) into the skin daily., Disp: 2 pen, Rfl: 3 .  metFORMIN (GLUCOPHAGE) 1000 MG tablet, TAKE 1 TABLET BY MOUTH TWICE DAILY WITH A MEAL, Disp: 180 tablet, Rfl: 3 .  oxyCODONE-acetaminophen (PERCOCET) 5-325 MG tablet, Take 1 tablet by mouth every 4 (four) hours as needed for severe pain., Disp: 20  tablet, Rfl: 0 .  polyethylene glycol (MIRALAX / GLYCOLAX) packet, Take 17 g by mouth as needed for mild constipation or moderate constipation. , Disp: , Rfl:   Social History   Tobacco Use  Smoking Status Never Smoker  Smokeless Tobacco Never Used    Allergies  Allergen Reactions  . Ace Inhibitors Swelling    Subglottic edema requiring intubation for respiratory failure   Objective:   There were no vitals filed for this visit. There is no height or weight on file to calculate BMI. Constitutional Well developed. Well nourished.  Vascular Foot warm and well perfused. Capillary refill normal to all digits.   Neurologic Normal speech. Oriented to person, place, and time. Epicritic sensation to light touch grossly present bilaterally.  Dermatologic Skin well healed but toe slightly edematous.   Orthopedic:  No tenderness to palpation noted about the surgical site.    Radiographs: None today. Assessment:   1. Post-operative state    Plan:  Patient was evaluated and treated and all questions answered.  S/p foot surgery right -Progressing as expected post-operatively. -XR: Taken and reviewed -WB Status: WBAT in normal shoegear. -Skin healed. -Medications: none refilled -Foot redressed. -Gentle coban applied to toe. Patient advised on application. Advised to check toe for circulation and to d/c if discoloration noted.  Contusion third toe right foot -Improved.  Return in about 1 month (around 07/20/2019).

## 2019-06-24 ENCOUNTER — Ambulatory Visit: Payer: BC Managed Care – PPO | Admitting: Cardiology

## 2019-06-24 DIAGNOSIS — E669 Obesity, unspecified: Secondary | ICD-10-CM | POA: Insufficient documentation

## 2019-06-24 NOTE — Progress Notes (Signed)
Virtual Visit via Video Note   This visit type was conducted due to national recommendations for restrictions regarding the COVID-19 Pandemic (e.g. social distancing) in an effort to limit this patient's exposure and mitigate transmission in our community.  Due to her co-morbid illnesses, this patient is at least at moderate risk for complications without adequate follow up.  This format is felt to be most appropriate for this patient at this time.  All issues noted in this document were discussed and addressed.  A limited physical exam was performed with this format.  Please refer to the patient's chart for her consent to telehealth for [facility].   Evaluation Performed:  Follow-up visit  This visit type was conducted due to national recommendations for restrictions regarding the COVID-19 Pandemic (e.g. social distancing).  This format is felt to be most appropriate for this patient at this time.  All issues noted in this document were discussed and addressed.  No physical exam was performed (except for noted visual exam findings with Video Visits).  Please refer to the patient's chart (MyChart message for video visits and phone note for telephone visits) for the patient's consent to telehealth for [facility].  Date:  06/24/2019   ID:  [name], DOB 09-Oct-1966, MRN [id number]  Patient Location:  Home  Provider location:   [place]  PCP:  McDiarmid, Blane Ohara, MD  Sleep Medicine:  Fransico Him, MD Electrophysiologist:  None   Chief Complaint:  OSA  History of Present Illness:    [name] is a 53 y.o. female who presents via audio/video conferencing for a telehealth visit today.    [name] is a 53 y.o. female who has a history of CAD, chronic systolic CHF and OSA who is on CPAP. She is doing well. She was seen by me for OSA last OV and had a history of OSA but was not on CPAP.  We ordered a sleep study showing mild OSA with an AHI of 8.6/hr and oxygen  desaturations as low as 87% with mild snoring.  She underwent CPAP to 16cm H2O with full face mask.  She is doing well with her CPAP device and thinks that she has gotten used to it.  She tolerates the mask and feels the pressure is adequate.  Since going on CPAP she feels rested in the am and has no significant daytime sleepiness.  She denies any significant mouth or nasal dryness or nasal congestion.  She does not think that he snores.    The patient does not have symptoms concerning for COVID-19 infection (fever, chills, cough, or new shortness of breath).    Prior CV studies:   The following studies were reviewed today:  PAP compliance download  Past Medical History:  Diagnosis Date  . Abnormal bruising 06/18/2016  . ARF (acute respiratory failure) ([place]) 02/10/2016  . CAD (coronary artery disease) 02/10/2016   a. 03/14/2016 Staged PCI with DES to mid RCA. b. NSTEMI 02/2016: DES to mid-LAD, POBA to 1st Diag, 85% stenosis of RCA noted with staged PCI recommended  . Chronic combined systolic (congestive) and diastolic (congestive) heart failure ([place])    a. Echo 02/2016: EF 40-45% w/ Grade 2 DD. 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  Marland Kitchen Dysphagia 03/09/2016   Referred to GI [place] 03/09/2016    . Dyspnea 03/09/2016   Off ACEi since 02/19/16 -Spirometry 03/09/2016  No airflow obst     . Fall 03/01/2016  .  Flash pulmonary edema ([place]) 02/10/2016   requiring intubation  . History of MRSA infection 11/05/2010   Skin abscesses  . History of myocardial infarction 02/04/2016  . Hypertension   . Hypokalemia 03/08/2017  . Leukocytosis 05/21/2016  . Meralgia paresthetica of right side 12/18/2013  . Microcytic anemia 05/18/2016  . Myocardial infarction ([place]) 02/2016  . NSTEMI (non-ST elevated myocardial infarction) ([place])   . Obesity (BMI 30-39.9) 04/03/2016  . Orthostatic hypotension 04/27/2014  . OSA (obstructive sleep apnea)   . OSA on CPAP   . Pneumonia   . Required emergent  intubation 02/10/2016  . Stridor 02/19/2016  . Type II diabetes mellitus ([place])    Past Surgical History:  Procedure Laterality Date  . ANTERIOR CERVICAL DECOMP/DISCECTOMY FUSION  08/28/2012   Procedure: ANTERIOR CERVICAL DECOMPRESSION/DISCECTOMY FUSION 2 LEVELS;  Surgeon: Otilio Connors, MD;  Location: [place];  Service: Neurosurgery;  Laterality: N/A;  Cervical four-five, cervical six-seven Anterior cervical decompression/diskectomy/fusion/LifeNet Bone/Trestle plate  . BACK SURGERY    . BONE BIOPSY Right 05/21/2019   Procedure: Bone Biopsy Middle Phalanx, Bone Biospy Proximal Phalanx with placement of antbiotic delivery device;  Surgeon: Evelina Bucy, DPM;  Location: [place];  Service: Podiatry;  Laterality: Right;  . BREAST CYST EXCISION Right   . CARDIAC CATHETERIZATION N/A 02/15/2016   Procedure: Left Heart Cath and Coronary Angiography;  Surgeon: Wellington Hampshire, MD;  Location: [place] [facility];  Service: Cardiovascular;  Laterality: N/A;  . CARDIAC CATHETERIZATION N/A 02/15/2016   Procedure: Coronary Stent Intervention;  Surgeon: Wellington Hampshire, MD;  Location: [place] [facility];  Service: Cardiovascular;  Laterality: N/A;  . CARDIAC CATHETERIZATION N/A 02/15/2016   Procedure: Coronary Balloon Angioplasty;  Surgeon: Wellington Hampshire, MD;  Location: [place] [facility];  Service: Cardiovascular;  Laterality: N/A;  . CARDIAC CATHETERIZATION N/A 03/14/2016   Procedure: Coronary Stent Intervention;  Surgeon: Wellington Hampshire, MD;  Location: [place] [facility];  Service: Cardiovascular;  Laterality: N/A;  . CORONARY ANGIOPLASTY WITH STENT PLACEMENT    . IRRIGATION AND DEBRIDEMENT FOOT Right 05/21/2019   Procedure: Right fourth toe debridement and removal of infected bone;  Surgeon: Evelina Bucy, DPM;  Location: [place];  Service: Podiatry;  Laterality: Right;  . SPLIT NIGHT STUDY  04/07/2016     Current Meds  Medication Sig  . albuterol (VENTOLIN HFA) 108 (90 Base) MCG/ACT inhaler  Inhale 1-2 puffs into the lungs every 4 (four) hours as needed for wheezing or shortness of breath.  Marland Kitchen amLODipine (NORVASC) 10 MG tablet Take 1 tablet (10 mg total) by mouth daily.  Marland Kitchen atorvastatin (LIPITOR) 40 MG tablet TAKE 1 TABLET BY MOUTH ONCE DAILY  . Bayer Microlet Lancets lancets Use as instructed  . blood glucose meter kit and supplies KIT Check sugars three times a day  . Blood Glucose Monitoring Suppl (CONTOUR BLOOD GLUCOSE SYSTEM) DEVI 1 Device by Does not apply route 3 (three) times daily.  . carvedilol (COREG) 12.5 MG tablet Take 1 tablet (12.5 mg total) by mouth 2 (two) times daily with a meal.  . gabapentin (NEURONTIN) 300 MG capsule TAKE 3 CAPSULES BY MOUTH THREE TIMES DAILY  . glucose blood test strip Use as instructed  . hydrochlorothiazide (MICROZIDE) 12.5 MG capsule Take 1 capsule (12.5 mg total) by mouth daily.  Marland Kitchen ibuprofen (ADVIL) 800 MG tablet Take 1 tablet (800 mg total) by mouth every 8 (eight) hours as needed for moderate pain.  Marland Kitchen insulin NPH Human (HUMULIN  N,NOVOLIN N) 100 UNIT/ML injection Inject 0.25 mLs (25 Units total) into the skin 2 (two) times daily before a meal.  . INSULIN SYRINGE 1CC/29G 29G X 1/2" 1 ML MISC Use to inject insulin daily.  Marland Kitchen liraglutide (VICTOZA) 18 MG/3ML SOPN Inject 0.2 mLs (1.2 mg total) into the skin daily.  . metFORMIN (GLUCOPHAGE) 1000 MG tablet TAKE 1 TABLET BY MOUTH TWICE DAILY WITH A MEAL  . polyethylene glycol (MIRALAX / GLYCOLAX) packet Take 17 g by mouth as needed for mild constipation or moderate constipation.      Allergies:   Ace inhibitors   Social History   Tobacco Use  . Smoking status: Never Smoker  . Smokeless tobacco: Never Used  Substance Use Topics  . Alcohol use: Not Currently  . Drug use: Yes    Types: Marijuana    Comment: last use 05/06/19     Family Hx: The patient's family history includes Diabetes in her paternal grandmother; Heart disease in her paternal aunt; Heart disease (age of onset: 31) in her  paternal grandmother; Heart disease (age of onset: 45) in her father; Hypertension in her paternal grandmother. There is no history of Cancer, Stroke, or Alcohol abuse.  ROS:   Please see the history of present illness.     All other systems reviewed and are negative.   Labs/Other Tests and Data Reviewed:    Recent Labs: 01/15/2019: ALT 28 05/21/2019: BUN 14; Creatinine, Ser 0.86; Hemoglobin 11.5; Platelets 191; Potassium 3.2; Sodium 140   Recent Lipid Panel Lab Results  Component Value Date/Time   CHOL 138 01/15/2019 05:02 PM   TRIG 162 (H) 01/15/2019 05:02 PM   HDL 48 01/15/2019 05:02 PM   CHOLHDL 2.9 01/15/2019 05:02 PM   CHOLHDL 3.7 05/15/2016 09:52 AM   LDLCALC 58 01/15/2019 05:02 PM    Wt Readings from Last 3 Encounters:  05/21/19 225 lb (102.1 kg)  05/18/19 225 lb (102.1 kg)  01/19/19 211 lb (95.7 kg)     Objective:    Vital Signs:  LMP 01/13/2018 (LMP Unknown)     ASSESSMENT & PLAN:    1.  OSA - The patient is tolerating PAP therapy well without any problems. The PAP download was reviewed today and showed an AHI of 20.2/hr on 16cm H2O with 40% compliance in using more than 4 hours nightly.  The patient has been using and benefiting from PAP use and will continue to benefit from therapy. I have encouraged her to be more compliant with her device.  She did not use it for a while this past month due to family problems.  Her AHI is too high so I will change her to auto CPAP from 5 to 20cm H2O and get a download in 2 weeks.  2.  Hypertension - He will continue on amlodipine 63m daily, Carvedilol 12.571mBID and HCTZ 12.49m50maily.    3.  Obesity - I have encouraged him to get into a routine exercise program and cut back on carbs and portions.   COVID-19 Education: The signs and symptoms of COVID-19 were discussed with the patient and how to seek care for testing (follow up with PCP or arrange E-visit).  The importance of social distancing was discussed today.  Patient  Risk:   After full review of this patient's clinical status, I feel that they are at least moderate risk at this time.  Time:   Today, I have spent 15 minutes directly with the patient on telemedicine discussing medical problems  including OSA, HTN, obesity.  We also reviewed the symptoms of COVID 19 and the ways to protect against contracting the virus with telehealth technology.  I spent an additional 5 minutes reviewing patient's chart including PAP download.  Medication Adjustments/Labs and Tests Ordered: Current medicines are reviewed at length with the patient today.  Concerns regarding medicines are outlined above.  Tests Ordered: No orders of the defined types were placed in this encounter.  Medication Changes: No orders of the defined types were placed in this encounter.   Disposition:  Follow up in 1 year(s)  Signed, Fransico Him, MD  06/24/2019 9:30 PM    [place]

## 2019-06-25 ENCOUNTER — Telehealth (INDEPENDENT_AMBULATORY_CARE_PROVIDER_SITE_OTHER): Payer: BC Managed Care – PPO | Admitting: Cardiology

## 2019-06-25 ENCOUNTER — Telehealth: Payer: Self-pay | Admitting: *Deleted

## 2019-06-25 ENCOUNTER — Other Ambulatory Visit: Payer: Self-pay

## 2019-06-25 VITALS — Ht 63.0 in | Wt 236.0 lb

## 2019-06-25 DIAGNOSIS — E669 Obesity, unspecified: Secondary | ICD-10-CM

## 2019-06-25 DIAGNOSIS — Z9989 Dependence on other enabling machines and devices: Secondary | ICD-10-CM

## 2019-06-25 DIAGNOSIS — I110 Hypertensive heart disease with heart failure: Secondary | ICD-10-CM | POA: Diagnosis not present

## 2019-06-25 DIAGNOSIS — I10 Essential (primary) hypertension: Secondary | ICD-10-CM

## 2019-06-25 DIAGNOSIS — G4733 Obstructive sleep apnea (adult) (pediatric): Secondary | ICD-10-CM

## 2019-06-25 NOTE — Telephone Encounter (Signed)
Order placed to [facility] via community message. 

## 2019-06-25 NOTE — Telephone Encounter (Signed)
-----   Message from Theodoro Parma, RN sent at 06/25/2019  9:39 AM EDT ----- Regarding: sleep order Please see below. Thanks! ----- Message ----- From: Sueanne Margarita, MD Sent: 06/25/2019   9:33 AM EDT To: Theodoro Parma, RN  Send not to [name] to change her CPAP to auto from 5 to 20cm H2O and get a download in 2 weeks.  Followup with me in 1 year

## 2019-07-20 ENCOUNTER — Other Ambulatory Visit: Payer: Self-pay | Admitting: Family Medicine

## 2019-07-20 DIAGNOSIS — E1142 Type 2 diabetes mellitus with diabetic polyneuropathy: Secondary | ICD-10-CM

## 2019-07-23 ENCOUNTER — Ambulatory Visit (INDEPENDENT_AMBULATORY_CARE_PROVIDER_SITE_OTHER): Payer: BC Managed Care – PPO | Admitting: Podiatry

## 2019-07-23 ENCOUNTER — Other Ambulatory Visit: Payer: Self-pay

## 2019-07-23 DIAGNOSIS — B351 Tinea unguium: Secondary | ICD-10-CM | POA: Diagnosis not present

## 2019-07-23 DIAGNOSIS — E1169 Type 2 diabetes mellitus with other specified complication: Secondary | ICD-10-CM

## 2019-07-23 DIAGNOSIS — E1142 Type 2 diabetes mellitus with diabetic polyneuropathy: Secondary | ICD-10-CM

## 2019-07-28 NOTE — Progress Notes (Signed)
Subjective:  Patient ID: [name], female    DOB: Mar 30, 1966,  MRN: [id number]  Chief Complaint  Patient presents with  . Routine Post Op    Right 4th digit doing well post surgery, no concerns or questions.  . Peripheral Neuropathy    Pt would like to discuss neuropathy medication as she has recently been having issues.   DOS: 05/21/2019 Procedure: Partial Resection of Phalanx, Excision of wound with closure right 4th toe.  53 y.o. female returns for post-op check.  History as above not having any issues with the right fourth toe  Review of Systems: Negative except as noted in the HPI. Denies N/V/F/Ch.  Past Medical History:  Diagnosis Date  . Abnormal bruising 06/18/2016  . ARF (acute respiratory failure) ([place]) 02/10/2016  . CAD (coronary artery disease) 02/10/2016   a. 03/14/2016 Staged PCI with DES to mid RCA. b. NSTEMI 02/2016: DES to mid-LAD, POBA to 1st Diag, 85% stenosis of RCA noted with staged PCI recommended  . Chronic combined systolic (congestive) and diastolic (congestive) heart failure ([place])    a. Echo 02/2016: EF 40-45% w/ Grade 2 DD. 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  Marland Kitchen Dysphagia 03/09/2016   Referred to GI [place] 03/09/2016    . Dyspnea 03/09/2016   Off ACEi since 02/19/16 -Spirometry 03/09/2016  No airflow obst     . Fall 03/01/2016  . Flash pulmonary edema ([place]) 02/10/2016   requiring intubation  . History of MRSA infection 11/05/2010   Skin abscesses  . History of myocardial infarction 02/04/2016  . Hypertension   . Hypokalemia 03/08/2017  . Leukocytosis 05/21/2016  . Meralgia paresthetica of right side 12/18/2013  . Microcytic anemia 05/18/2016  . Myocardial infarction ([place]) 02/2016  . NSTEMI (non-ST elevated myocardial infarction) ([place])   . Obesity (BMI 30-39.9) 04/03/2016  . Orthostatic hypotension 04/27/2014  . OSA (obstructive sleep apnea)   . OSA on CPAP   . Pneumonia   . Required emergent intubation 02/10/2016  . Stridor 02/19/2016   . Type II diabetes mellitus ([facility])     Current Outpatient Medications:  .  albuterol (VENTOLIN HFA) 108 (90 Base) MCG/ACT inhaler, Inhale 1-2 puffs into the lungs every 4 (four) hours as needed for wheezing or shortness of breath., Disp: , Rfl:  .  amLODipine (NORVASC) 10 MG tablet, Take 1 tablet (10 mg total) by mouth daily., Disp: 60 tablet, Rfl: 5 .  atorvastatin (LIPITOR) 40 MG tablet, TAKE 1 TABLET BY MOUTH ONCE DAILY, Disp: 30 tablet, Rfl: 11 .  Bayer Microlet Lancets lancets, Use as instructed, Disp: 100 each, Rfl: 12 .  blood glucose meter kit and supplies KIT, Check sugars three times a day, Disp: 1 each, Rfl: 0 .  Blood Glucose Monitoring Suppl (CONTOUR BLOOD GLUCOSE SYSTEM) DEVI, 1 Device by Does not apply route 3 (three) times daily., Disp: 100 strip, Rfl: PRN .  carvedilol (COREG) 12.5 MG tablet, Take 1 tablet (12.5 mg total) by mouth 2 (two) times daily with a meal., Disp: 180 tablet, Rfl: 3 .  gabapentin (NEURONTIN) 300 MG capsule, TAKE 3 CAPSULES BY MOUTH THREE TIMES DAILY, Disp: 270 capsule, Rfl: 3 .  glucose blood test strip, Use as instructed, Disp: 100 each, Rfl: 12 .  hydrochlorothiazide (MICROZIDE) 12.5 MG capsule, Take 1 capsule (12.5 mg total) by mouth daily., Disp: 90 capsule, Rfl: 3 .  ibuprofen (ADVIL) 800 MG tablet, Take 1 tablet (800 mg total) by mouth every 8 (eight) hours as needed  for moderate pain., Disp: 30 tablet, Rfl: 0 .  insulin NPH Human (HUMULIN N,NOVOLIN N) 100 UNIT/ML injection, Inject 0.25 mLs (25 Units total) into the skin 2 (two) times daily before a meal., Disp: 10 mL, Rfl: 11 .  INSULIN SYRINGE 1CC/29G 29G X 1/2" 1 ML MISC, Use to inject insulin daily., Disp: 100 each, Rfl: 1 .  liraglutide (VICTOZA) 18 MG/3ML SOPN, Inject 0.2 mLs (1.2 mg total) into the skin daily., Disp: 2 pen, Rfl: 3 .  metFORMIN (GLUCOPHAGE) 1000 MG tablet, TAKE 1 TABLET BY MOUTH TWICE DAILY WITH A MEAL, Disp: 180 tablet, Rfl: 3 .  polyethylene glycol (MIRALAX / GLYCOLAX) packet,  Take 17 g by mouth as needed for mild constipation or moderate constipation. , Disp: , Rfl:   Social History   Tobacco Use  Smoking Status Never Smoker  Smokeless Tobacco Never Used    Allergies  Allergen Reactions  . Ace Inhibitors Swelling    Subglottic edema requiring intubation for respiratory failure   Objective:   There were no vitals filed for this visit. There is no height or weight on file to calculate BMI. Constitutional Well developed. Well nourished.  Vascular Foot warm and well perfused. Capillary refill normal to all digits.   Neurologic Normal speech. Oriented to person, place, and time. Epicritic sensation to light touch grossly present bilaterally.  Dermatologic Skin well healed but toe slightly edematous.   Orthopedic:  No tenderness to palpation noted about the surgical site.    Radiographs: None today. Assessment:   1. Onychomycosis of multiple toenails with type 2 diabetes mellitus and peripheral neuropathy ([place])   2. Diabetic polyneuropathy associated with type 2 diabetes mellitus ([place])    Plan:  Patient was evaluated and treated and all questions answered.  S/p foot surgery right -Well-healed no pain  Painful diabetic neuropathy -Discussed treatment options including Lyrica versus gabapentin patient is maxed out dose of gabapentin and has tried Lyrica in the past.  Discussed topical formula.  Will send Rx  Onychomycosis with neuropathy -Nails debrided x10  Procedure: Nail Debridement Rationale: Patient meets criteria for routine foot care due to DPN Type of Debridement: manual, sharp debridement. Instrumentation: Nail nipper, rotary burr. Number of Nails: 10  No follow-ups on file.

## 2019-07-29 ENCOUNTER — Telehealth: Payer: Self-pay | Admitting: *Deleted

## 2019-07-29 MED ORDER — NONFORMULARY OR COMPOUNDED ITEM: 11 refills | Status: DC

## 2019-07-29 NOTE — Telephone Encounter (Signed)
Faxed orders to [facility]. 

## 2019-07-29 NOTE — Telephone Encounter (Signed)
-----   Message from Evelina Bucy, DPM sent at 07/28/2019  5:58 PM EDT ----- Can we send Rx for compound pain cream for neuropathy?

## 2019-08-07 ENCOUNTER — Ambulatory Visit: Payer: BC Managed Care – PPO | Admitting: Cardiology

## 2019-09-02 IMAGING — CR RIGHT FOOT - 2 VIEW
2 series · 2 of 2 positions shown · non-contrast
Comparison: May 15, 2019

CLINICAL DATA: Status post debridement and removal for infected
bone in the fourth proximal phalanx

EXAM:
RIGHT FOOT - 2 VIEW

[AP]
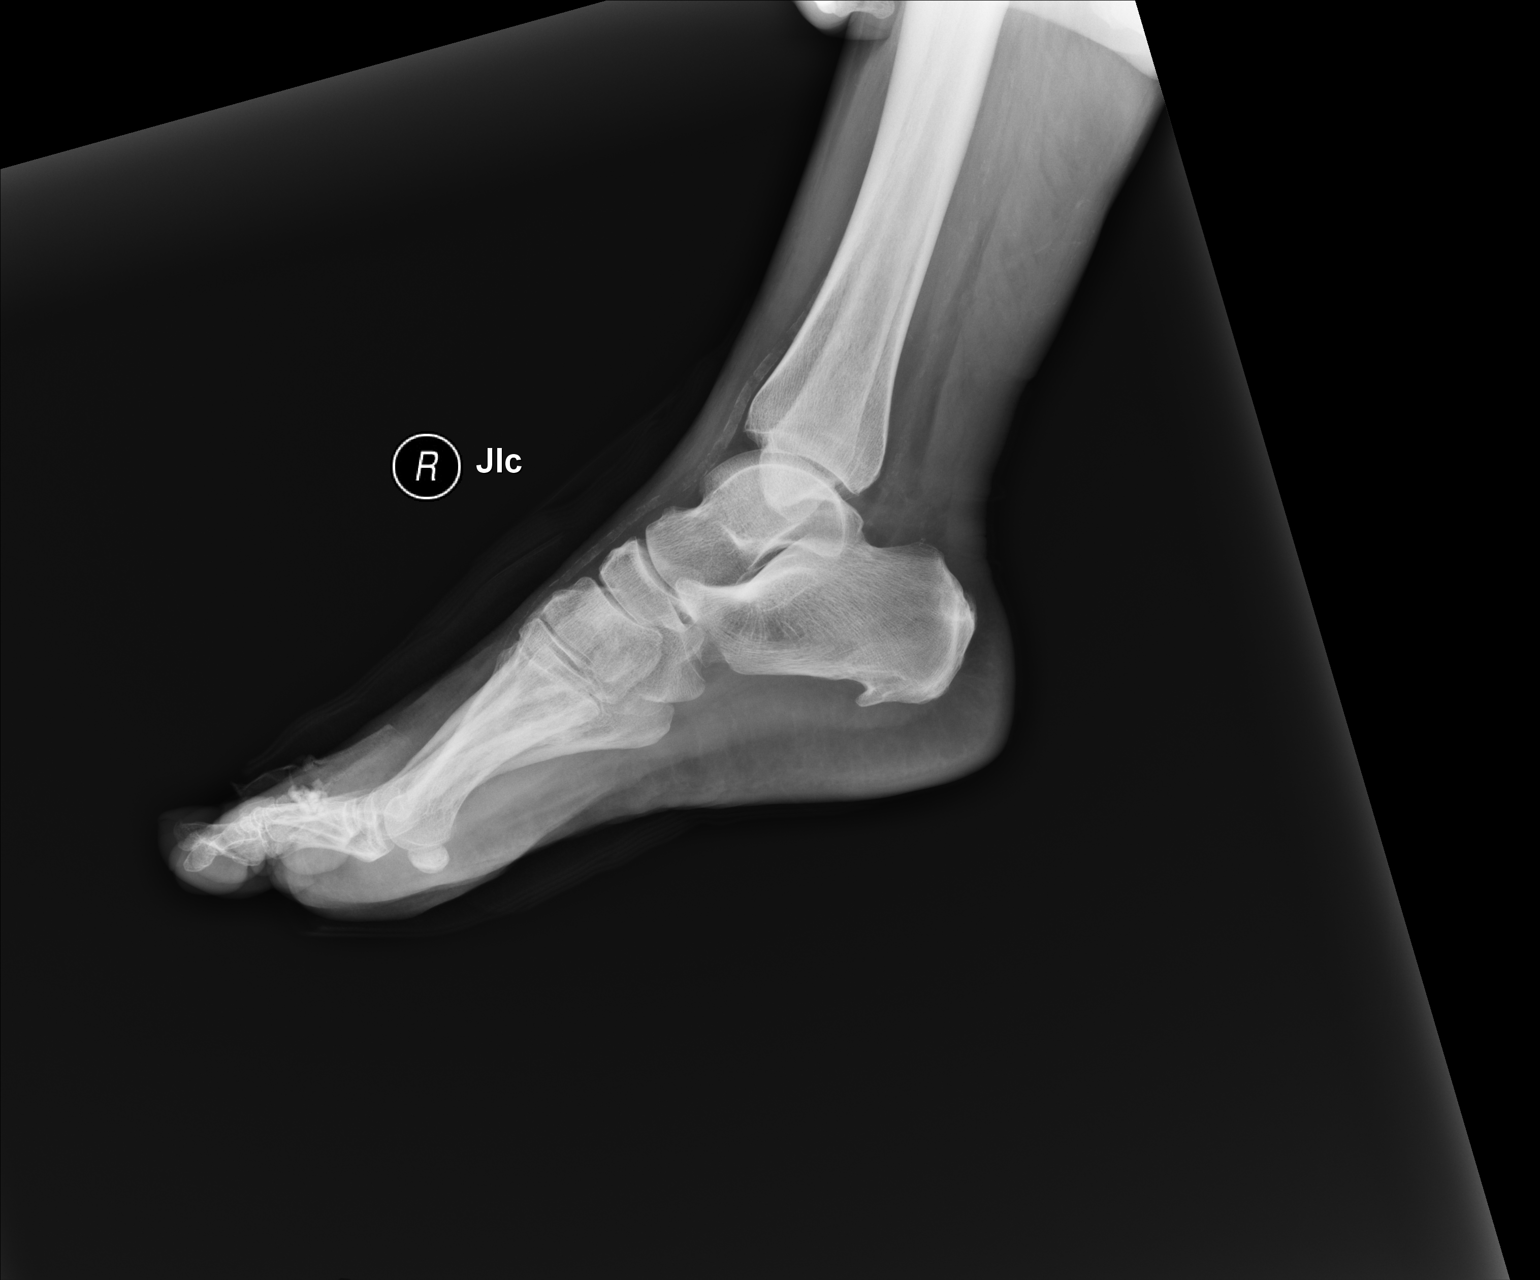

[lateral]
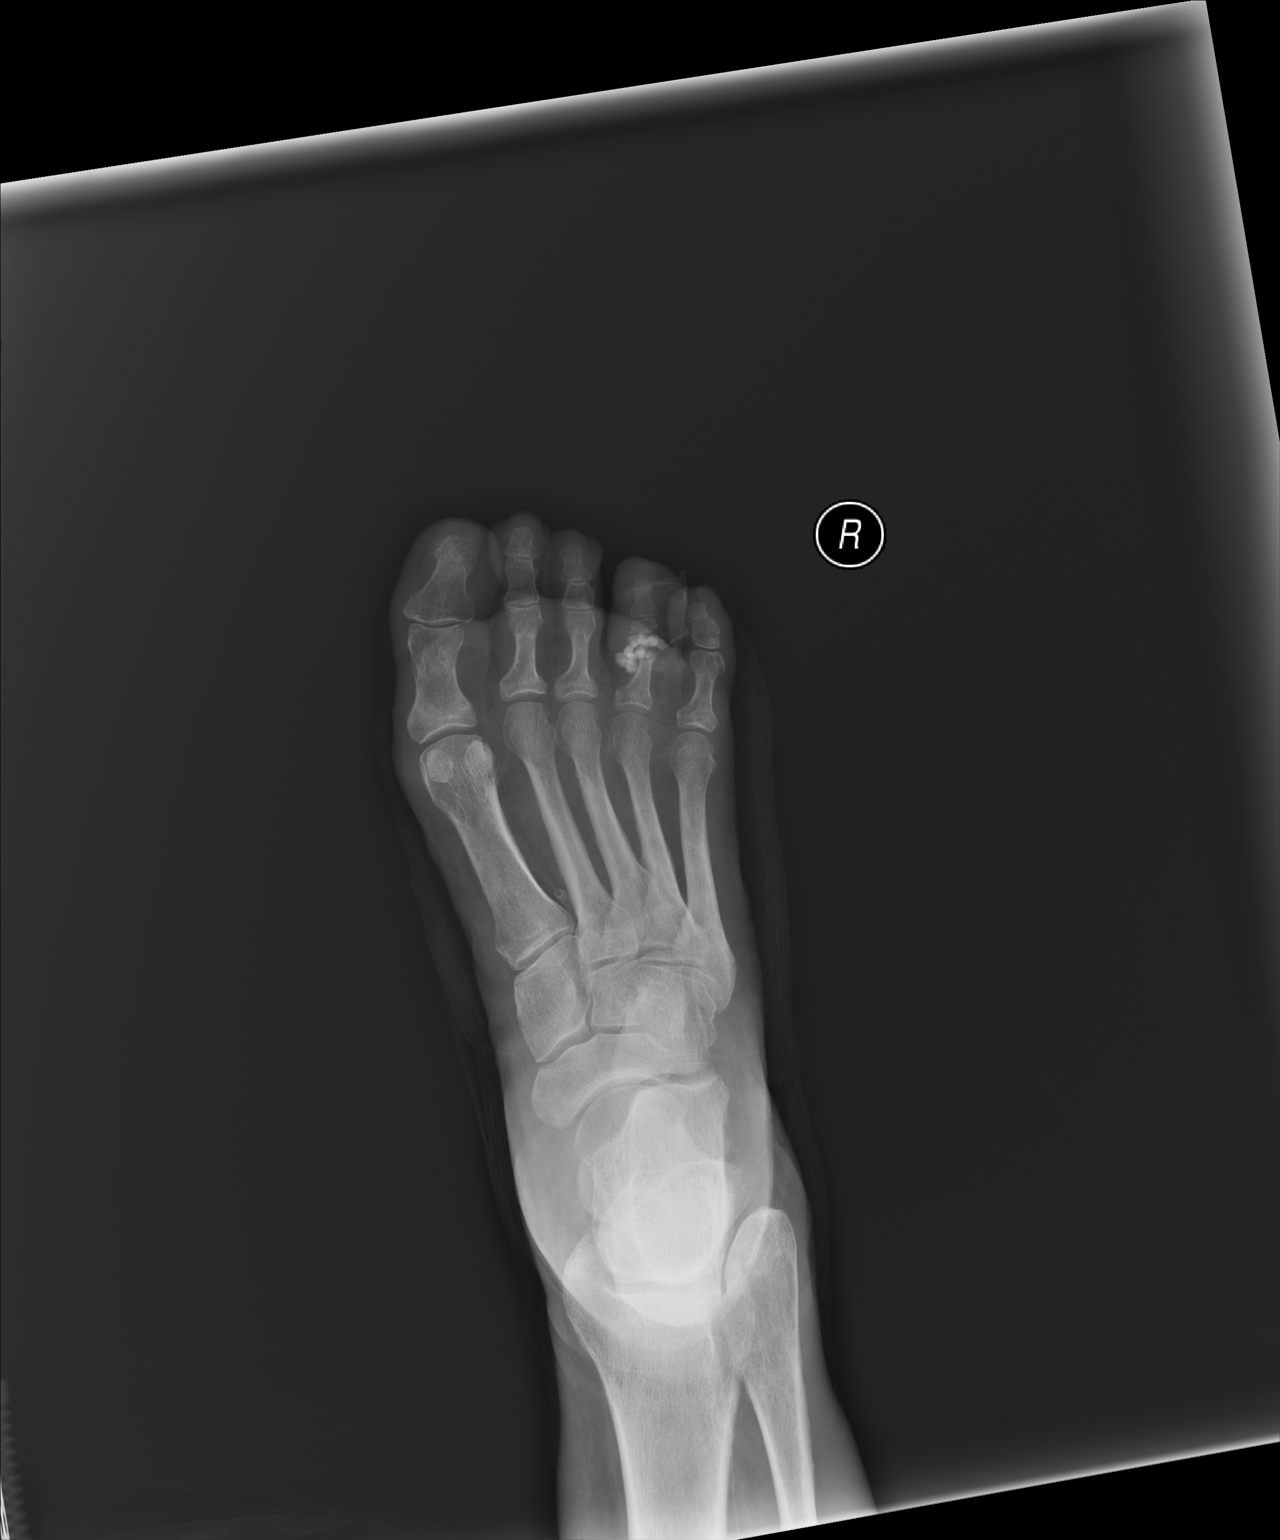

[2 of 2 positions shown; findings below may reference images not displayed]

FINDINGS: Frontal and lateral views obtained. Postoperative changes noted in
the distal aspect of the fourth proximal phalanx with bead like
material in this area currently. No acute fracture or dislocation is
evident. There is no appreciable joint space narrowing or erosion.
There is an inferior calcaneal spur. There is calcification in the
dorsalis pedis artery.
IMPRESSION: Postoperative change in the distal aspect fourth proximal phalanx
with bead like material in the area of recent bone removal. No new
fracture or dislocation. No new bony destruction. No appreciable
joint space narrowing or erosion. Inferior calcaneal spur.

## 2019-09-03 ENCOUNTER — Other Ambulatory Visit: Payer: Self-pay

## 2019-09-03 ENCOUNTER — Ambulatory Visit (INDEPENDENT_AMBULATORY_CARE_PROVIDER_SITE_OTHER): Payer: BC Managed Care – PPO | Admitting: Podiatry

## 2019-09-03 DIAGNOSIS — M2041 Other hammer toe(s) (acquired), right foot: Secondary | ICD-10-CM | POA: Diagnosis not present

## 2019-09-03 NOTE — Progress Notes (Signed)
Subjective:  Patient ID: [name], female    DOB: [birth year],  MRN: [id number]  Chief Complaint  Patient presents with  . Foot Problem    Right 4th digit is still swelling, pt has no pain and no concerns.   DOS: 05/21/2019 Procedure: Partial Resection of Phalanx, Excision of wound with closure right 4th toe.  53 y.o. female returns for post-op check.  Hx as above. Pleased with the results of surgery.   Review of Systems: Negative except as noted in the HPI. Denies N/V/F/Ch.  Past Medical History:  Diagnosis Date  . Abnormal bruising 06/18/2016  . ARF (acute respiratory failure) ([place]) 02/10/2016  . CAD (coronary artery disease) 02/10/2016   a. 03/14/2016 Staged PCI with DES to mid RCA. b. NSTEMI 02/2016: DES to mid-LAD, POBA to 1st Diag, 85% stenosis of RCA noted with staged PCI recommended  . Chronic combined systolic (congestive) and diastolic (congestive) heart failure ([place])    a. Echo 02/2016: EF 40-45% w/ Grade 2 DD. 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  Marland Kitchen Dysphagia 03/09/2016   Referred to GI [place] 03/09/2016    . Dyspnea 03/09/2016   Off ACEi since 02/19/16 -Spirometry 03/09/2016  No airflow obst     . Fall 03/01/2016  . Flash pulmonary edema ([place]) 02/10/2016   requiring intubation  . History of MRSA infection 11/05/2010   Skin abscesses  . History of myocardial infarction 02/04/2016  . Hypertension   . Hypokalemia 03/08/2017  . Leukocytosis 05/21/2016  . Meralgia paresthetica of right side 12/18/2013  . Microcytic anemia 05/18/2016  . Myocardial infarction ([place]) 02/2016  . NSTEMI (non-ST elevated myocardial infarction) ([place])   . Obesity (BMI 30-39.9) 04/03/2016  . Orthostatic hypotension 04/27/2014  . OSA (obstructive sleep apnea)   . OSA on CPAP   . Pneumonia   . Required emergent intubation 02/10/2016  . Stridor 02/19/2016  . Type II diabetes mellitus ([facility])     Current Outpatient Medications:  .  albuterol (VENTOLIN HFA) 108 (90 Base) MCG/ACT  inhaler, Inhale 1-2 puffs into the lungs every 4 (four) hours as needed for wheezing or shortness of breath., Disp: , Rfl:  .  amLODipine (NORVASC) 10 MG tablet, Take 1 tablet (10 mg total) by mouth daily., Disp: 60 tablet, Rfl: 5 .  atorvastatin (LIPITOR) 40 MG tablet, TAKE 1 TABLET BY MOUTH ONCE DAILY, Disp: 30 tablet, Rfl: 11 .  Bayer Microlet Lancets lancets, Use as instructed, Disp: 100 each, Rfl: 12 .  blood glucose meter kit and supplies KIT, Check sugars three times a day, Disp: 1 each, Rfl: 0 .  Blood Glucose Monitoring Suppl (CONTOUR BLOOD GLUCOSE SYSTEM) DEVI, 1 Device by Does not apply route 3 (three) times daily., Disp: 100 strip, Rfl: PRN .  carvedilol (COREG) 12.5 MG tablet, Take 1 tablet (12.5 mg total) by mouth 2 (two) times daily with a meal., Disp: 180 tablet, Rfl: 3 .  gabapentin (NEURONTIN) 300 MG capsule, TAKE 3 CAPSULES BY MOUTH THREE TIMES DAILY, Disp: 270 capsule, Rfl: 3 .  glucose blood test strip, Use as instructed, Disp: 100 each, Rfl: 12 .  hydrochlorothiazide (MICROZIDE) 12.5 MG capsule, Take 1 capsule (12.5 mg total) by mouth daily., Disp: 90 capsule, Rfl: 3 .  ibuprofen (ADVIL) 800 MG tablet, Take 1 tablet (800 mg total) by mouth every 8 (eight) hours as needed for moderate pain., Disp: 30 tablet, Rfl: 0 .  insulin NPH Human (HUMULIN N,NOVOLIN N) 100 UNIT/ML injection, Inject 0.25 mLs (  25 Units total) into the skin 2 (two) times daily before a meal., Disp: 10 mL, Rfl: 11 .  INSULIN SYRINGE 1CC/29G 29G X 1/2" 1 ML MISC, Use to inject insulin daily., Disp: 100 each, Rfl: 1 .  liraglutide (VICTOZA) 18 MG/3ML SOPN, Inject 0.2 mLs (1.2 mg total) into the skin daily., Disp: 2 pen, Rfl: 3 .  metFORMIN (GLUCOPHAGE) 1000 MG tablet, TAKE 1 TABLET BY MOUTH TWICE DAILY WITH A MEAL, Disp: 180 tablet, Rfl: 3 .  NONFORMULARY OR COMPOUNDED ITEM, [facility]:  Peripheral Neurology Cream - Bupivacaine 1%, Doxepin 3%, Gabapentin 6%, Pentoxifylline 3%, Topiramate 1%. Apply 1-2  grams to affected 3-4 times a day., Disp: 100 each, Rfl: 11 .  polyethylene glycol (MIRALAX / GLYCOLAX) packet, Take 17 g by mouth as needed for mild constipation or moderate constipation. , Disp: , Rfl:   Social History   Tobacco Use  Smoking Status Never Smoker  Smokeless Tobacco Never Used    Allergies  Allergen Reactions  . Ace Inhibitors Swelling    Subglottic edema requiring intubation for respiratory failure   Objective:   There were no vitals filed for this visit. There is no height or weight on file to calculate BMI. Constitutional Well developed. Well nourished.  Vascular Foot warm and well perfused. Capillary refill normal to all digits.   Neurologic Normal speech. Oriented to person, place, and time. Epicritic sensation to light touch grossly present bilaterally.  Dermatologic Skin well healed still residual edema to the digit.  Orthopedic:  No tenderness to palpation noted about the surgical site.    Radiographs: None today. Assessment:   1. Hammer toe of right foot    Plan:  Patient was evaluated and treated and all questions answered.  S/p foot surgery right -Well-healed no pain -Still edema, should resolve with time -F/u in 2 months for at risk foot care.  No follow-ups on file.

## 2019-09-18 ENCOUNTER — Other Ambulatory Visit: Payer: Self-pay | Admitting: Family Medicine

## 2019-09-18 DIAGNOSIS — E1159 Type 2 diabetes mellitus with other circulatory complications: Secondary | ICD-10-CM

## 2019-09-21 ENCOUNTER — Other Ambulatory Visit: Payer: Self-pay | Admitting: *Deleted

## 2019-09-21 NOTE — Telephone Encounter (Signed)
Patient declined wanting her flu shot this year.  She did request refills on some of her medication but isn't at home right now to clarify which ones.  Patient will call me back or send me a mychart with those names.  [name],CMA

## 2019-11-09 ENCOUNTER — Other Ambulatory Visit: Payer: Self-pay | Admitting: *Deleted

## 2019-11-09 DIAGNOSIS — E1159 Type 2 diabetes mellitus with other circulatory complications: Secondary | ICD-10-CM

## 2019-11-09 MED ORDER — BAYER MICROLET LANCETS MISC: 12 refills | Status: DC

## 2019-11-09 MED ORDER — GLUCOSE BLOOD VI STRP: ORAL_STRIP | 12 refills | Status: DC

## 2019-11-16 MED ORDER — ACCU-CHEK SOFTCLIX LANCETS MISC: 12 refills | Status: DC

## 2019-11-16 NOTE — Addendum Note (Signed)
Addended by: Henri Medal on: 11/16/2019 09:39 AM   Modules accepted: Orders

## 2019-11-18 ENCOUNTER — Other Ambulatory Visit: Payer: Self-pay | Admitting: Family Medicine

## 2019-11-18 DIAGNOSIS — E1142 Type 2 diabetes mellitus with diabetic polyneuropathy: Secondary | ICD-10-CM

## 2019-11-19 ENCOUNTER — Other Ambulatory Visit: Payer: Self-pay

## 2019-11-19 DIAGNOSIS — E1142 Type 2 diabetes mellitus with diabetic polyneuropathy: Secondary | ICD-10-CM

## 2019-11-20 MED ORDER — GABAPENTIN 300 MG PO CAPS: 900.0000 mg | ORAL_CAPSULE | Freq: Three times a day (TID) | ORAL | 3 refills | Status: DC

## 2020-01-20 ENCOUNTER — Other Ambulatory Visit: Payer: Self-pay | Admitting: Family Medicine

## 2020-01-20 DIAGNOSIS — I10 Essential (primary) hypertension: Secondary | ICD-10-CM

## 2020-02-13 ENCOUNTER — Other Ambulatory Visit: Payer: Self-pay | Admitting: Family Medicine

## 2020-02-13 DIAGNOSIS — I2510 Atherosclerotic heart disease of native coronary artery without angina pectoris: Secondary | ICD-10-CM

## 2020-02-13 DIAGNOSIS — I214 Non-ST elevation (NSTEMI) myocardial infarction: Secondary | ICD-10-CM

## 2020-02-13 DIAGNOSIS — I10 Essential (primary) hypertension: Secondary | ICD-10-CM

## 2020-02-15 ENCOUNTER — Other Ambulatory Visit: Payer: Self-pay

## 2020-02-15 DIAGNOSIS — E1142 Type 2 diabetes mellitus with diabetic polyneuropathy: Secondary | ICD-10-CM

## 2020-02-15 DIAGNOSIS — I10 Essential (primary) hypertension: Secondary | ICD-10-CM

## 2020-02-15 DIAGNOSIS — E1159 Type 2 diabetes mellitus with other circulatory complications: Secondary | ICD-10-CM

## 2020-02-15 MED ORDER — GABAPENTIN 300 MG PO CAPS: 900.0000 mg | ORAL_CAPSULE | Freq: Three times a day (TID) | ORAL | 3 refills | Status: DC

## 2020-02-15 MED ORDER — METFORMIN HCL 1000 MG PO TABS: ORAL_TABLET | ORAL | 1 refills | Status: DC

## 2020-02-15 MED ORDER — HYDROCHLOROTHIAZIDE 12.5 MG PO CAPS: 12.5000 mg | ORAL_CAPSULE | Freq: Every day | ORAL | 3 refills | Status: DC

## 2020-02-26 ENCOUNTER — Other Ambulatory Visit: Payer: Self-pay | Admitting: Family Medicine

## 2020-02-26 DIAGNOSIS — E785 Hyperlipidemia, unspecified: Secondary | ICD-10-CM

## 2020-02-26 DIAGNOSIS — I2510 Atherosclerotic heart disease of native coronary artery without angina pectoris: Secondary | ICD-10-CM

## 2020-03-10 ENCOUNTER — Other Ambulatory Visit: Payer: Self-pay

## 2020-03-10 ENCOUNTER — Encounter: Payer: Self-pay | Admitting: Family Medicine

## 2020-03-10 ENCOUNTER — Ambulatory Visit (INDEPENDENT_AMBULATORY_CARE_PROVIDER_SITE_OTHER): Payer: BC Managed Care – PPO | Admitting: Family Medicine

## 2020-03-10 VITALS — BP 124/74 | HR 65 | Ht 63.0 in | Wt 236.0 lb

## 2020-03-10 DIAGNOSIS — E1142 Type 2 diabetes mellitus with diabetic polyneuropathy: Secondary | ICD-10-CM

## 2020-03-10 DIAGNOSIS — D509 Iron deficiency anemia, unspecified: Secondary | ICD-10-CM | POA: Diagnosis not present

## 2020-03-10 DIAGNOSIS — Z1212 Encounter for screening for malignant neoplasm of rectum: Secondary | ICD-10-CM

## 2020-03-10 DIAGNOSIS — Z79899 Other long term (current) drug therapy: Secondary | ICD-10-CM | POA: Diagnosis not present

## 2020-03-10 DIAGNOSIS — E1140 Type 2 diabetes mellitus with diabetic neuropathy, unspecified: Secondary | ICD-10-CM

## 2020-03-10 DIAGNOSIS — I10 Essential (primary) hypertension: Secondary | ICD-10-CM

## 2020-03-10 DIAGNOSIS — E785 Hyperlipidemia, unspecified: Secondary | ICD-10-CM

## 2020-03-10 DIAGNOSIS — Z0000 Encounter for general adult medical examination without abnormal findings: Secondary | ICD-10-CM

## 2020-03-10 DIAGNOSIS — E1159 Type 2 diabetes mellitus with other circulatory complications: Secondary | ICD-10-CM | POA: Diagnosis not present

## 2020-03-10 DIAGNOSIS — G2581 Restless legs syndrome: Secondary | ICD-10-CM

## 2020-03-10 DIAGNOSIS — Z1211 Encounter for screening for malignant neoplasm of colon: Secondary | ICD-10-CM

## 2020-03-10 DIAGNOSIS — R29898 Other symptoms and signs involving the musculoskeletal system: Secondary | ICD-10-CM

## 2020-03-10 LAB — POCT GLYCOSYLATED HEMOGLOBIN (HGB A1C): HbA1c, POC (controlled diabetic range): 6.7 % (ref 0.0–7.0)

## 2020-03-10 MED ORDER — GABAPENTIN 300 MG PO CAPS: 1200.0000 mg | ORAL_CAPSULE | Freq: Three times a day (TID) | ORAL | 3 refills | Status: DC

## 2020-03-10 NOTE — Patient Instructions (Addendum)
[name]  Please stop the HCTZ (hydrochlorothiazide) blood pressure medication.  Your blood pressure is under good control. It would be good to see if you need HCTZ still.   Please start Victoza (lriaglutide).  It will help you with reducing how much insulin you need and help with losing weight.  Inject 0.6 mg daily for 1 week, then 1.2 mg daily for one week, then 1.8 mg daily. Watch for low blood sugars (hypoglycemia).  If you start having low blood sugars, stop the liraglutide and let Dr Ita Fritzsche know.   Decrease you NPH insulin to 15 units twice a day.  Decreasing you insulin will help you lose weight.   A referral has been made [facility] for colon cancer screening by colonoscopy.   A referral has been made to [facility] for you diabetic eye exam to look for early changes in the retina from diabetes that can cause blindness.  If you have labs (blood work) drawn today and your tests are completely normal, you will receive your results only by:  MyChart Message (if you have MyChart) OR  A paper copy in the mail If you have any lab test that is abnormal or we need to change your treatment, we will call you to review the results.     Stop by the [facility] front desk to schedule an appointment with the Colposcopy clinic to schedule an appointment to have your pap smear for cervical cancer screening.       Restless Legs Syndrome Restless legs syndrome is a condition that causes uncomfortable feelings or sensations in the legs, especially while sitting or lying down. The sensations usually cause an overwhelming urge to move the legs. The arms can also sometimes be affected. The condition can range from mild to severe. The symptoms often interfere with a person's ability to sleep. What are the causes? The cause of this condition is not known. What increases the risk? The following factors may make you more likely  to develop this condition:  Being older than 50.  Pregnancy.  Being a woman. In general, the condition is more common in women than in men.  A family history of the condition.  Having iron deficiency.  Overuse of caffeine, nicotine, or alcohol.  Certain medical conditions, such as kidney disease, Parkinson's disease, or nerve damage.  Certain medicines, such as those for high blood pressure, nausea, colds, allergies, depression, and some heart conditions. What are the signs or symptoms? The main symptom of this condition is uncomfortable sensations in the legs, such as:  Pulling.  Tingling.  Prickling.  Throbbing.  Crawling.  Burning. Usually, the sensations:  Affect both sides of the body.  Are worse when you sit or lie down.  Are worse at night. These may wake you up or make it difficult to fall asleep.  Make you have a strong urge to move your legs.  Are temporarily relieved by moving your legs. The arms can also be affected, but this is rare. People who have this condition often have tiredness during the day because of their lack of sleep at night. How is this diagnosed? This condition may be diagnosed based on:  Your symptoms.  Blood tests. In some cases, you may be monitored in a sleep lab by a specialist (a sleep study). This can detect any disruptions in your sleep. How is this treated? This condition is treated by managing the symptoms. This may include:  Lifestyle changes, such as exercising, using relaxation techniques, and avoiding caffeine, alcohol, or tobacco.  Medicines. Anti-seizure medicines may be tried first. Follow these instructions at home:     General instructions  Take over-the-counter and prescription medicines only as told by your health care provider.  Use methods to help relieve the uncomfortable sensations, such as: ? Massaging your legs. ? Walking or stretching. ? Taking a cold or hot bath.  Keep all follow-up visits  as told by your health care provider. This is important. Lifestyle  Practice good sleep habits. For example, go to bed and get up at the same time every day. Most adults should get 7-9 hours of sleep each night.  Exercise regularly. Try to get at least 30 minutes of exercise most days of the week.  Practice ways of relaxing, such as yoga or meditation.  Avoid caffeine and alcohol.  Do not use any products that contain nicotine or tobacco, such as cigarettes and e-cigarettes. If you need help quitting, ask your health care provider. Contact a health care provider if:  Your symptoms get worse or they do not improve with treatment. Summary  Restless legs syndrome is a condition that causes uncomfortable feelings or sensations in the legs, especially while sitting or lying down.  The symptoms often interfere with a person's ability to sleep.  This condition is treated by managing the symptoms. You may need to make lifestyle changes or take medicines. This information is not intended to replace advice given to you by your health care provider. Make sure you discuss any questions you have with your health care provider. Document Revised: 11/11/2017 Document Reviewed: 11/11/2017 Elsevier Patient Education  [place].

## 2020-03-11 ENCOUNTER — Encounter: Payer: Self-pay | Admitting: Family Medicine

## 2020-03-11 ENCOUNTER — Telehealth: Payer: Self-pay | Admitting: *Deleted

## 2020-03-11 LAB — CBC WITH DIFFERENTIAL/PLATELET
Basophils Absolute: 0.1 10*3/uL (ref 0.0–0.2)
Basos: 1 %
EOS (ABSOLUTE): 0.3 10*3/uL (ref 0.0–0.4)
Eos: 3 %
Hematocrit: 40.2 % (ref 34.0–46.6)
Hemoglobin: 12.7 g/dL (ref 11.1–15.9)
Immature Grans (Abs): 0.1 10*3/uL (ref 0.0–0.1)
Immature Granulocytes: 1 %
Lymphocytes Absolute: 1.9 10*3/uL (ref 0.7–3.1)
Lymphs: 22 %
MCH: 25.1 pg — ABNORMAL LOW (ref 26.6–33.0)
MCHC: 31.6 g/dL (ref 31.5–35.7)
MCV: 79 fL (ref 79–97)
Monocytes Absolute: 0.5 10*3/uL (ref 0.1–0.9)
Monocytes: 6 %
Neutrophils Absolute: 6.1 10*3/uL (ref 1.4–7.0)
Neutrophils: 67 %
Platelets: 220 10*3/uL (ref 150–450)
RBC: 5.06 x10E6/uL (ref 3.77–5.28)
RDW: 15.8 % — ABNORMAL HIGH (ref 11.7–15.4)
WBC: 8.9 10*3/uL (ref 3.4–10.8)

## 2020-03-11 LAB — CMP14+EGFR
ALT: 26 IU/L (ref 0–32)
AST: 26 IU/L (ref 0–40)
Albumin/Globulin Ratio: 1.8 (ref 1.2–2.2)
Albumin: 4.4 g/dL (ref 3.8–4.9)
Alkaline Phosphatase: 87 IU/L (ref 39–117)
BUN/Creatinine Ratio: 22 (ref 9–23)
BUN: 17 mg/dL (ref 6–24)
Bilirubin Total: 0.6 mg/dL (ref 0.0–1.2)
CO2: 25 mmol/L (ref 20–29)
Calcium: 10.4 mg/dL — ABNORMAL HIGH (ref 8.7–10.2)
Chloride: 100 mmol/L (ref 96–106)
Creatinine, Ser: 0.76 mg/dL (ref 0.57–1.00)
GFR calc Af Amer: 104 mL/min/{1.73_m2} (ref >59)
GFR calc non Af Amer: 90 mL/min/{1.73_m2} (ref >59)
Globulin, Total: 2.5 g/dL (ref 1.5–4.5)
Glucose: 118 mg/dL — ABNORMAL HIGH (ref 65–99)
Potassium: 3.8 mmol/L (ref 3.5–5.2)
Sodium: 141 mmol/L (ref 134–144)
Total Protein: 6.9 g/dL (ref 6.0–8.5)

## 2020-03-11 LAB — FERRITIN: Ferritin: 25 ng/mL (ref 15–150)

## 2020-03-11 LAB — LDL CHOLESTEROL, DIRECT: LDL Direct: 65 mg/dL (ref 0–99)

## 2020-03-11 NOTE — Assessment & Plan Note (Signed)
Established problem Recheck CBC and Ferritin today Referral to [facility] for colonoscopy completed today

## 2020-03-11 NOTE — Assessment & Plan Note (Signed)
Established problem Uncontrolled. BMI 42% with complications of hypertension and diabetes [name] is in the preparation stage of change.  She wishes to make changes in her activity and nutrition with goal of improving her physical abilities and reduce her weight.   Plan Referral to [facility]al Therapy Titration up of Liraglutide (see DMT2 A/P for today for details) and reduction in insulin dose.  RTC in 4-5 weeks to assess response and tolerance

## 2020-03-11 NOTE — Telephone Encounter (Signed)
-----   Message from Leighton Roach McDiarmid, MD sent at 03/11/2020  8:07 AM EDT ----- Regarding: Schedule appointment Please schedule Ms Haywood to see me back in 4 to 5 weeks to follow up her DM. Thank you Tawanna Cooler

## 2020-03-11 NOTE — Telephone Encounter (Signed)
Lm for patient that I have scheduled her for a follow up visit with Dr. Perley Jain on 04-07-20.  Ramal Eckhardt,CMA

## 2020-03-11 NOTE — Assessment & Plan Note (Signed)
Established problem Tolerating lipitor 40 mg daily Checking LDL-D today Goal LDL < 70 Continue Lipitor 40 mg daily for now.

## 2020-03-11 NOTE — Assessment & Plan Note (Signed)
Established problem Uncontrolled.  Requiring 4 Gabapentin capsules to keep sustained relief of buring pains in hands and feet.  Increase Gabapentin 40mm mg capsule from 3 capsules po TID to 4 capsules TID.

## 2020-03-11 NOTE — Assessment & Plan Note (Addendum)
Referral to [facility] for DM eye exam Referral to [facility] for CRC screening by colonoscopy Referral to [facility] for cervical cancer screening (pap smear) as patient prefers a female physician for female physical examination.   Donnetta Hutching assisted Ms [facility] in making an appointment for her first COVID vaccination

## 2020-03-11 NOTE — Assessment & Plan Note (Addendum)
Established problem. Adequate blood pressure control.  No evidence of new end organ damage.  Tolerating medication without significant adverse effects.   Plan   Stop HCTZ. Continue Amlodipine 10 mg daily and Carvedilol 12.5 mg BID (Hx of CAD)

## 2020-03-11 NOTE — Assessment & Plan Note (Addendum)
Established problem Lab Results  Component Value Date   HGBA1C 6.7 03/10/2020    Controlled with NPH 25 units [place] BID and Metformin 1000 mg BID.  [name] has not been taking the Liraglutide.  Plan Continue Metformin 1000 mg BID Reduce dose insulin to 15 units [place] BID Restart Liraglutide with titration up: 0.6 mg [place] daily x 1 week, then 1.2 mg [place] daily x 1 wk, the 1.8 units  daily. Discussed risk of hypoglycemia, need to keep around simple sugars for self-rescue, and contact our office is hypoglycemia occurs.  Goals to continue A1c < 7 - 7.5% and reduce insulin dose

## 2020-03-11 NOTE — Progress Notes (Signed)
CPT E&M Office Visit Time Before Visit; reviewing medical records (e.g. recent visits, labs, studies): 5 minutes During Visit (F2F time): 20 minutes After Visit (discussion with family or HCP, prescribing, ordering, referring, calling result/recommendations or documenting on same day): 5 minutes Total Visit Time: 30 minutes  Level 2 Est: 10-19 min     New: 15-29 min Level 3 Est: 20-29 min     New: 30-44 min Level 4 Est: 30-39 min     New: 45-59 min Level 5 Est: 40-54 min     New: 60-74 min   > Level 5 - see prolonged service CPT E&M codes; 99XXX  Diabetic foot exam was performed with the following findings:   No deformities, ulcerations, or other skin breakdown Normal sensation of 10g monofilament Intact posterior tibialis and dorsalis pedis pulses     Visit Problem List with A/P  Painful diabetic neuropathy (HCC) Established problem Uncontrolled.  Requiring 4 Gabapentin capsules to keep sustained relief of buring pains in hands and feet.  Increase Gabapentin 35mm mg capsule from 3 capsules po TID to 4 capsules TID.   Type 2 diabetes mellitus with other circulatory complications (HCC) Established problem Lab Results  Component Value Date   HGBA1C 6.7 03/10/2020    Controlled with NPH 25 units [place] BID and Metformin 1000 mg BID.  Ms Hoadley has not been taking the Liraglutide.  Plan Continue Metformin 1000 mg BID Reduce dose insulin to 15 units [place] BID Restart Liraglutide with titration up: 0.6 mg [place] daily x 1 week, then 1.2 mg  daily x 1 wk, the 1.8 units  daily. Discussed risk of hypoglycemia, need to keep around simple sugars for self-rescue, and contact our office is hypoglycemia occurs.  Goals to continue A1c < 7 - 7.5% and reduce insulin dose    Morbid (severe) obesity due to excess calories (HCC) Established problem Uncontrolled. BMI 42% with complications of hypertension and diabetes Ms Health is in the preparation stage of change.  She wishes to make changes in  her activity and nutrition with goal of improving her physical abilities and reduce her weight.   Plan Referral to [facility] Medical Nutritional Therapy Titration up of Liraglutide (see DMT2 A/P for today for details) and reduction in insulin dose.  RTC in 4-5 weeks to assess response and tolerance    Essential hypertension Established problem. Adequate blood pressure control.  No evidence of new end organ damage.  Tolerating medication without significant adverse effects.   Plan   Stop HCTZ. Continue Amlodipine 10 mg daily and Carvedilol 12.5 mg BID (Hx of CAD)   Hyperlipidemia with target LDL less than 100 Established problem Tolerating lipitor 40 mg daily Checking LDL-D today Goal LDL < 70 Continue Lipitor 40 mg daily for now.    Microcytic anemia Established problem Recheck CBC and Ferritin today Referral to [facility] for colonoscopy completed today  Healthcare maintenance Referral to [facility] for DM eye exam Referral to [facility] for CRC screening by colonoscopy Referral to [facility] for cervical cancer screening (pap smear) as patient prefers a female physician for female physical examination.   Donnetta Hutching assisted Ms Health in making an appointment for her first COVID vaccination

## 2020-03-12 LAB — MICROALBUMIN / CREATININE URINE RATIO
Creatinine, Urine: 102.2 mg/dL
Microalb/Creat Ratio: 10 mg/g creat (ref 0–29)
Microalbumin, Urine: 10 ug/mL

## 2020-03-14 ENCOUNTER — Telehealth: Payer: Self-pay | Admitting: Family Medicine

## 2020-03-14 ENCOUNTER — Other Ambulatory Visit: Payer: Self-pay | Admitting: *Deleted

## 2020-03-14 DIAGNOSIS — E1159 Type 2 diabetes mellitus with other circulatory complications: Secondary | ICD-10-CM

## 2020-03-14 NOTE — Telephone Encounter (Signed)
Pt. is requesting refill for victoza and the restless leg medication. Ph # is [phone]

## 2020-03-15 MED ORDER — LIRAGLUTIDE 18 MG/3ML ~~LOC~~ SOPN: 1.2000 mg | PEN_INJECTOR | Freq: Every day | SUBCUTANEOUS | 3 refills | Status: DC

## 2020-03-15 NOTE — Telephone Encounter (Signed)
Spoke with patient while she was in the office and she said that when she called the pharmacy her medications from Thursday were not there.  I called and spoke with the pharmacy today and patient can pick up her gabapentin which will help with her "restless legs" and also I sent in a new script for victoza that wasn't sent to the pharmacy at her last visit.  Patient also states that she would like to try something for gas/acid reflux.  I told her that provider is not in the office until next week and that she can try an otc medication and see if she gets relief.  Patient voiced understanding and will let us know how it is working next month when she comes in.  [employer]

## 2020-03-15 NOTE — Telephone Encounter (Signed)
Error

## 2020-04-05 ENCOUNTER — Other Ambulatory Visit: Payer: Self-pay

## 2020-04-05 DIAGNOSIS — K219 Gastro-esophageal reflux disease without esophagitis: Secondary | ICD-10-CM

## 2020-04-05 MED ORDER — OMEPRAZOLE 20 MG PO CPDR: 20.0000 mg | DELAYED_RELEASE_CAPSULE | Freq: Two times a day (BID) | ORAL | 1 refills | Status: DC

## 2020-04-05 NOTE — Telephone Encounter (Signed)
Patient calls nurse line requesting Omeprazole sent to her pharmacy. Per patient, she was told she can get this OTC, however this is too expensive for her OTC. Patient is requesting a prescription so her insurance will pay. She would like this sent to [facility] on [address].   I do not see this on her current medication list. Per patient, she is taking Omeprazole 20mg  BID.

## 2020-04-07 ENCOUNTER — Ambulatory Visit: Payer: BC Managed Care – PPO | Admitting: Family Medicine

## 2020-04-16 ENCOUNTER — Other Ambulatory Visit: Payer: Self-pay | Admitting: Family Medicine

## 2020-04-16 DIAGNOSIS — I2510 Atherosclerotic heart disease of native coronary artery without angina pectoris: Secondary | ICD-10-CM

## 2020-04-16 DIAGNOSIS — I214 Non-ST elevation (NSTEMI) myocardial infarction: Secondary | ICD-10-CM

## 2020-04-16 DIAGNOSIS — I10 Essential (primary) hypertension: Secondary | ICD-10-CM

## 2020-05-11 ENCOUNTER — Other Ambulatory Visit: Payer: Self-pay | Admitting: Family Medicine

## 2020-05-11 DIAGNOSIS — E1159 Type 2 diabetes mellitus with other circulatory complications: Secondary | ICD-10-CM

## 2020-05-16 ENCOUNTER — Ambulatory Visit: Payer: BC Managed Care – PPO | Admitting: Podiatry

## 2020-06-02 ENCOUNTER — Other Ambulatory Visit: Payer: Self-pay

## 2020-06-02 DIAGNOSIS — E1142 Type 2 diabetes mellitus with diabetic polyneuropathy: Secondary | ICD-10-CM

## 2020-06-02 DIAGNOSIS — E1159 Type 2 diabetes mellitus with other circulatory complications: Secondary | ICD-10-CM

## 2020-06-02 MED ORDER — GLUCOSE BLOOD VI STRP: ORAL_STRIP | 12 refills | Status: DC

## 2020-06-02 MED ORDER — GABAPENTIN 300 MG PO CAPS: 1200.0000 mg | ORAL_CAPSULE | Freq: Three times a day (TID) | ORAL | 3 refills | Status: DC

## 2020-06-09 ENCOUNTER — Other Ambulatory Visit: Payer: Self-pay | Admitting: Podiatry

## 2020-06-09 ENCOUNTER — Other Ambulatory Visit: Payer: Self-pay

## 2020-06-09 ENCOUNTER — Ambulatory Visit (INDEPENDENT_AMBULATORY_CARE_PROVIDER_SITE_OTHER): Payer: BC Managed Care – PPO | Admitting: Podiatry

## 2020-06-09 ENCOUNTER — Ambulatory Visit (INDEPENDENT_AMBULATORY_CARE_PROVIDER_SITE_OTHER): Payer: BC Managed Care – PPO

## 2020-06-09 DIAGNOSIS — S90122A Contusion of left lesser toe(s) without damage to nail, initial encounter: Secondary | ICD-10-CM

## 2020-06-09 DIAGNOSIS — M79672 Pain in left foot: Secondary | ICD-10-CM | POA: Diagnosis not present

## 2020-06-09 NOTE — Progress Notes (Signed)
  Subjective:  Patient ID: [name], female    DOB: April 08, 1966,  MRN: [id number]  Chief Complaint  Patient presents with  . Toe Injury    3rd digit, pain and swelling, has shooting pains, about 1 month duration and doesn't recall any injury    54 y.o. female presents with the above complaint. History confirmed with patient.   Objective:  Physical Exam: warm, good capillary refill, no trophic changes or ulcerative lesions and normal DP and PT pulses. Left Foot: soft tissue swelling noted over the 3rd toe, no tenderness   Radiographs: X-ray of the left foot: some irregularity and joint space narrowing of the DIPJ, no obvious fracture or dislocation visible, no erosions or soft tissue emphysema Assessment:   1. Left foot pain   2. Contusion of third toe of left foot, initial encounter      Plan:  Patient was evaluated and treated and all questions answered.   - She has 3rd toe swelling, no open entry wounds or ulcers. She likely has a contusion from stubbing it and doesn't recall secondary to her severe neuropathy. I would like to re X-ray in 3 weeks. Surgical shoe was dispensed and buddy taping reviewed  Return in about 3 weeks (around 06/30/2020) for possible fracture.

## 2020-06-16 ENCOUNTER — Other Ambulatory Visit: Payer: Self-pay

## 2020-06-16 ENCOUNTER — Ambulatory Visit (INDEPENDENT_AMBULATORY_CARE_PROVIDER_SITE_OTHER): Payer: BC Managed Care – PPO | Admitting: Family Medicine

## 2020-06-16 ENCOUNTER — Encounter: Payer: Self-pay | Admitting: Family Medicine

## 2020-06-16 VITALS — BP 124/70 | HR 67 | Ht 63.0 in | Wt 242.0 lb

## 2020-06-16 DIAGNOSIS — M1711 Unilateral primary osteoarthritis, right knee: Secondary | ICD-10-CM

## 2020-06-16 DIAGNOSIS — E1165 Type 2 diabetes mellitus with hyperglycemia: Secondary | ICD-10-CM

## 2020-06-16 DIAGNOSIS — E1159 Type 2 diabetes mellitus with other circulatory complications: Secondary | ICD-10-CM

## 2020-06-16 DIAGNOSIS — G4733 Obstructive sleep apnea (adult) (pediatric): Secondary | ICD-10-CM

## 2020-06-16 DIAGNOSIS — H40059 Ocular hypertension, unspecified eye: Secondary | ICD-10-CM | POA: Diagnosis not present

## 2020-06-16 DIAGNOSIS — Z794 Long term (current) use of insulin: Secondary | ICD-10-CM

## 2020-06-16 DIAGNOSIS — Z9989 Dependence on other enabling machines and devices: Secondary | ICD-10-CM

## 2020-06-16 DIAGNOSIS — E1140 Type 2 diabetes mellitus with diabetic neuropathy, unspecified: Secondary | ICD-10-CM | POA: Diagnosis not present

## 2020-06-16 DIAGNOSIS — I10 Essential (primary) hypertension: Secondary | ICD-10-CM

## 2020-06-16 LAB — POCT GLYCOSYLATED HEMOGLOBIN (HGB A1C): HbA1c, POC (controlled diabetic range): 6.5 % (ref 0.0–7.0)

## 2020-06-16 MED ORDER — TRAMADOL HCL 50 MG PO TABS: 50.0000 mg | ORAL_TABLET | Freq: Three times a day (TID) | ORAL | 0 refills | Status: AC | PRN

## 2020-06-16 MED ORDER — IBUPROFEN 800 MG PO TABS: 800.0000 mg | ORAL_TABLET | Freq: Three times a day (TID) | ORAL | 0 refills | Status: DC | PRN

## 2020-06-16 NOTE — Assessment & Plan Note (Addendum)
Established problem Uncontrolled. Interfering with falling asleep  Start: Tramadol 50 mg at bedtime  Continue: gabapentin  RTC 4 weeks  May add TCA or Duloxetine in furutel  No response to toradol in past.

## 2020-06-16 NOTE — Progress Notes (Signed)
[name] is alone Sources of clinical information for visit is/are patient and past medical records. Nursing assessment for this office visit was reviewed with the patient for accuracy and revision.     Previous Report(s) Reviewed: lab reports and office notes  Depression screen [facility] 2/9 06/16/2020  Decreased Interest 2  Down, Depressed, Hopeless 2  PHQ - 2 Score 4  Altered sleeping 2  Tired, decreased energy 2  Change in appetite 0  Feeling bad or failure about yourself  2  Trouble concentrating 1  Moving slowly or fidgety/restless 0  Suicidal thoughts 1  PHQ-9 Score 12  Some recent data might be hidden    Fall Risk  04/24/2016 04/27/2014  Falls in the past year? Yes No  Number falls in past yr: 1 -  Injury with Fall? Yes -  Risk Factor Category  High Fall Risk -  Risk for fall due to : History of fall(s);Other (Comment) -  Risk for fall due to: Comment Lt knee buckles, bilateral diabetic foot pain -  Follow up Falls evaluation completed -    PHQ9 SCORE ONLY 06/16/2020 03/10/2020 05/18/2019  PHQ-9 Total Score 12 12 0    Adult vaccines due  Topic Date Due  . TETANUS/TDAP  12/19/2023  . PNEUMOCOCCAL POLYSACCHARIDE VACCINE AGE 5-64 HIGH RISK  Completed    Health Maintenance Due  Topic Date Due  . Hepatitis C Screening  Never done  . PAP SMEAR-Modifier  Never done  . OPHTHALMOLOGY EXAM  11/23/2014  . MAMMOGRAM  Never done  . COLONOSCOPY  Never done  . INFLUENZA VACCINE  06/05/2020      History/P.E. limitations: none  Adult vaccines due  Topic Date Due  . TETANUS/TDAP  12/19/2023  . PNEUMOCOCCAL POLYSACCHARIDE VACCINE AGE 5-64 HIGH RISK  Completed    Diabetes Health Maintenance Due  Topic Date Due  . OPHTHALMOLOGY EXAM  11/23/2014  . HEMOGLOBIN A1C  12/17/2020  . FOOT EXAM  03/10/2021    Health Maintenance Due  Topic Date Due  . Hepatitis C Screening  Never done  . PAP SMEAR-Modifier  Never done  . OPHTHALMOLOGY EXAM  11/23/2014  . MAMMOGRAM  Never done   . COLONOSCOPY  Never done  . INFLUENZA VACCINE  06/05/2020     Chief Complaint  Patient presents with  . Diabetes  . Insomnia    Onset: several months ago Location: anterior right knee Quality: aching Severity: moderate Function: interfere with standing and walking at work Chief Strategy Officer) Course: slowly worsening Radiation: no Relief: sitting, lying down Worsen: going from sitting to stand, prolonged standing, and walking.  Precipitant: no recalled trauma Associated Symptoms:       Restricted ROM/stiffness/swelling:  stiffness   Functional Impact:       Change in impairing ease of functioning at work    *       Change in exercise: unable to exercise.   Trauma (Acute or Chronic): no Prior Diagnostic Testing or Treatments: no prior imaging in epic

## 2020-06-16 NOTE — Assessment & Plan Note (Addendum)
Established problem Lab Results  Component Value Date   HGBA1C 6.5 06/16/2020  Controlled Continue current medications and other regiments I learned that GLP1s are on "restricted access" in her [employer] plan, but dapagliflozin is not.  Will start dapagliflozine 5 mg PO qam.  May titrate to 10 mg qam at next ov.   We discussed that [name] will need to watch for hypoglycemia with the introduction of dapagliflozin, her NPH insulin dose will likely need reduction at some point.  Goal would be to reduce Jamie Mates's insulin NPH dose

## 2020-06-16 NOTE — Patient Instructions (Addendum)
Your Diabetes is under great control.  Keep with your current insulin dose Twice a day and your metformin twice a day.  Dr Deneane Stifter will look into diabetes medication that your insurance will pay for and also help lower your insulin dose and help with weight loss.   Good Blood Pressure control Stay on your current blood pressure medications  Let Dr Heatherly Stenner's office if you do not hear from the eye doctor's office and the [facility] office to schedule a visit with the Sleep Specialist doctor.    For your knee, consider getting a cane to use with walking.  Hold it in the left hand to relieve pressure on the right knee.   Consider rubbing in Voltaren gel (diclofenac) up to four times a day into your painful knee.  It is over the counter.   Try a trial of Tramadol at bedtime to decrease the painful diabetic feet pain and help you get to sleep.   Dr Jamesina Gaugh want to get you back for an office we visit in 4 week to see how all these things are going.

## 2020-06-16 NOTE — Assessment & Plan Note (Signed)
Established problem Controlled Continue current medications and other regiments  

## 2020-06-16 NOTE — Assessment & Plan Note (Signed)
Established problem Uncontrolled No longer has her CPAP device, it was given to her Grandfather. She believes the CPAP device was dispensed in 2018.   Referral to Sleep Specialist physician at [facility]

## 2020-06-16 NOTE — Assessment & Plan Note (Signed)
Established problem Uncontrolled Will try to reduce insulin dose and add a DMT2 medication with weight loss associations if insurance will cover it.

## 2020-06-16 NOTE — Assessment & Plan Note (Signed)
New problem Moderate pain Interfering with work function Referral to PT Recommended OTC voltaren gell and use of cane.  May use APAP or NSAIds

## 2020-06-17 ENCOUNTER — Other Ambulatory Visit: Payer: Self-pay | Admitting: Family Medicine

## 2020-06-17 MED ORDER — DAPAGLIFLOZIN PROPANEDIOL 5 MG PO TABS: 5.0000 mg | ORAL_TABLET | Freq: Every day | ORAL | 0 refills | Status: DC

## 2020-06-17 NOTE — Addendum Note (Signed)
Addended byPerley Jain, Naythen Heikkila D on: 06/17/2020 11:31 AM   Modules accepted: Orders

## 2020-06-30 ENCOUNTER — Other Ambulatory Visit: Payer: Self-pay

## 2020-06-30 ENCOUNTER — Ambulatory Visit (INDEPENDENT_AMBULATORY_CARE_PROVIDER_SITE_OTHER): Payer: BC Managed Care – PPO | Admitting: Podiatry

## 2020-06-30 ENCOUNTER — Ambulatory Visit (INDEPENDENT_AMBULATORY_CARE_PROVIDER_SITE_OTHER): Payer: BC Managed Care – PPO

## 2020-06-30 DIAGNOSIS — M86672 Other chronic osteomyelitis, left ankle and foot: Secondary | ICD-10-CM | POA: Diagnosis not present

## 2020-06-30 DIAGNOSIS — E1142 Type 2 diabetes mellitus with diabetic polyneuropathy: Secondary | ICD-10-CM

## 2020-06-30 DIAGNOSIS — E08621 Diabetes mellitus due to underlying condition with foot ulcer: Secondary | ICD-10-CM | POA: Diagnosis not present

## 2020-06-30 DIAGNOSIS — S90122D Contusion of left lesser toe(s) without damage to nail, subsequent encounter: Secondary | ICD-10-CM

## 2020-06-30 DIAGNOSIS — L97511 Non-pressure chronic ulcer of other part of right foot limited to breakdown of skin: Secondary | ICD-10-CM

## 2020-06-30 MED ORDER — DOXYCYCLINE HYCLATE 100 MG PO TABS: 100.0000 mg | ORAL_TABLET | Freq: Two times a day (BID) | ORAL | 0 refills | Status: AC

## 2020-07-01 ENCOUNTER — Encounter: Payer: Self-pay | Admitting: Podiatry

## 2020-07-01 ENCOUNTER — Ambulatory Visit
Admission: RE | Admit: 2020-07-01 | Discharge: 2020-07-01 | Disposition: A | Discharge status: Home / Self Care | Payer: BC Managed Care – PPO | Source: Ambulatory Visit | Attending: Podiatry | Admitting: Podiatry

## 2020-07-01 ENCOUNTER — Other Ambulatory Visit: Payer: Self-pay

## 2020-07-01 DIAGNOSIS — M86172 Other acute osteomyelitis, left ankle and foot: Secondary | ICD-10-CM

## 2020-07-01 DIAGNOSIS — M86672 Other chronic osteomyelitis, left ankle and foot: Secondary | ICD-10-CM

## 2020-07-01 DIAGNOSIS — L03032 Cellulitis of left toe: Secondary | ICD-10-CM | POA: Diagnosis not present

## 2020-07-01 HISTORY — DX: Other acute osteomyelitis, left ankle and foot: M86.172

## 2020-07-01 MED ORDER — GADOBENATE DIMEGLUMINE 529 MG/ML IV SOLN
5.0000 mL | Freq: Once | INTRAVENOUS | Status: AC | PRN
  Administered 2020-07-01: 5 mL via INTRAVENOUS

## 2020-07-01 NOTE — Progress Notes (Signed)
  Subjective:  Patient ID: [name], female    DOB: 04/19/1966,  MRN: [id number]  Chief Complaint  Patient presents with  . Toe Injury    follow up contusion of left third toe    54 y.o. female returns with the above complaint. History confirmed with patient.  She has been buddy taping the toes together and has not helped at all.  She is still red and swollen.  Minimal pain.  Objective:  Physical Exam: warm, good capillary refill, no trophic changes or ulcerative lesions and normal DP and PT pulses. Left Foot: soft tissue swelling noted over the 3rd toe, no tenderness, erythema noted to the toe  Radiographs: X-ray of the left foot: Today there is a Starkes think erosion of the distal phalanx of the third digit compared to last radiographs Assessment:   1. Chronic osteomyelitis of toe of left foot (HCC)   2. Contusion of third toe of left foot, subsequent encounter   3. Diabetic polyneuropathy associated with type 2 diabetes mellitus (HCC)   4. Diabetic ulcer of toe of right foot associated with diabetes mellitus due to underlying condition, limited to breakdown of skin (HCC)      Plan:  Patient was evaluated and treated and all questions answered.   -It appears today that her soft tissue swelling of the third digit is likely related to destruction of the distal phalanx of the third toe.  Suspicious for osteomyelitis.  She does have a history of right fourth toe ulceration and osteomyelitis that Dr. Samuella Cota of treated.  She does not recall any specific wound on this toe or foot recently.  It is possible this is hematogenous spread. -Lab work-up in order to evaluate for presence of infection -MRI ordered with and without contrast of the left foot to evaluate the distal phalanx as well as the middle phalanx. -I discussed with her that she is at high risk for amputation either partially or completely of the third toe, an MRI will help to plan this and guide this  procedure. -Prescription was sent for doxycycline twice daily for 14 days  Return in about 2 weeks (around 07/14/2020) for after MRI to review.

## 2020-07-02 LAB — CBC WITH DIFFERENTIAL/PLATELET
Absolute Monocytes: 403 cells/uL (ref 200–950)
Basophils Absolute: 59 cells/uL (ref 0–200)
Basophils Relative: 0.7 %
Eosinophils Absolute: 361 cells/uL (ref 15–500)
Eosinophils Relative: 4.3 %
HCT: 39.5 % (ref 35.0–45.0)
Hemoglobin: 12 g/dL (ref 11.7–15.5)
Lymphs Abs: 1848 cells/uL (ref 850–3900)
MCH: 24.1 pg — ABNORMAL LOW (ref 27.0–33.0)
MCHC: 30.4 g/dL — ABNORMAL LOW (ref 32.0–36.0)
MCV: 79.3 fL — ABNORMAL LOW (ref 80.0–100.0)
MPV: 12.5 fL (ref 7.5–12.5)
Monocytes Relative: 4.8 %
Neutro Abs: 5729 cells/uL (ref 1500–7800)
Neutrophils Relative %: 68.2 %
Platelets: 202 10*3/uL (ref 140–400)
RBC: 4.98 10*6/uL (ref 3.80–5.10)
RDW: 16.5 % — ABNORMAL HIGH (ref 11.0–15.0)
Total Lymphocyte: 22 %
WBC: 8.4 10*3/uL (ref 3.8–10.8)

## 2020-07-02 LAB — C-REACTIVE PROTEIN: CRP: 10.9 mg/L — ABNORMAL HIGH (ref <8.0)

## 2020-07-02 LAB — SEDIMENTATION RATE: Sed Rate: 22 mm/h (ref 0–30)

## 2020-07-04 ENCOUNTER — Encounter: Payer: Self-pay | Admitting: Family Medicine

## 2020-07-04 ENCOUNTER — Telehealth (INDEPENDENT_AMBULATORY_CARE_PROVIDER_SITE_OTHER): Payer: BC Managed Care – PPO | Admitting: Podiatry

## 2020-07-04 ENCOUNTER — Telehealth: Payer: Self-pay | Admitting: Podiatry

## 2020-07-04 NOTE — Telephone Encounter (Signed)
Could you schedule a visit for [name] with me on Wed 9/1? She completed her MRI and we need to schedule her surgery for 9/10. She asked for afternoon if possible. I'm happy to see her at 1:00 or 4:30 if we need to for scheduling. Thanks!   Dr Lilian Kapur

## 2020-07-06 ENCOUNTER — Telehealth: Payer: Self-pay | Admitting: Family Medicine

## 2020-07-06 ENCOUNTER — Other Ambulatory Visit: Payer: Self-pay

## 2020-07-06 ENCOUNTER — Ambulatory Visit (INDEPENDENT_AMBULATORY_CARE_PROVIDER_SITE_OTHER): Payer: BC Managed Care – PPO | Admitting: Podiatry

## 2020-07-06 DIAGNOSIS — M86672 Other chronic osteomyelitis, left ankle and foot: Secondary | ICD-10-CM | POA: Diagnosis not present

## 2020-07-06 DIAGNOSIS — E1159 Type 2 diabetes mellitus with other circulatory complications: Secondary | ICD-10-CM

## 2020-07-06 DIAGNOSIS — E1142 Type 2 diabetes mellitus with diabetic polyneuropathy: Secondary | ICD-10-CM

## 2020-07-06 DIAGNOSIS — M2041 Other hammer toe(s) (acquired), right foot: Secondary | ICD-10-CM

## 2020-07-06 NOTE — Telephone Encounter (Signed)
History and physical form dropped off at front desk for completion.  Verified that patient section of form has been completed.  Last DOS/WCC with PCP was 06/16/20.  Placed form in blue team folder to be completed by clinical staff.  Vilinda Blanks

## 2020-07-06 NOTE — Progress Notes (Signed)
  Subjective:  Patient ID: [name], female    DOB: Sep 09, 1966,  MRN: [id number]  Chief Complaint  Patient presents with  . Osteomyelitis     MRI Results left    54 y.o. female returns with the above complaint. History confirmed with patient.  Here for MRI review  Objective:  Physical Exam: warm, good capillary refill, no trophic changes or ulcerative lesions and normal DP and PT pulses. Left Foot: soft tissue swelling noted over the 3rd toe, no tenderness, erythema noted to the toe  Study Result  Narrative & Impression  CLINICAL DATA:  Third toe infection.   EXAM: MRI OF THE LEFT FOREFOOT WITHOUT AND WITH CONTRAST   TECHNIQUE: Multiplanar, multisequence MR imaging of the left forefoot was performed both before and after administration of intravenous contrast.   CONTRAST:  7mL MULTIHANCE GADOBENATE DIMEGLUMINE 529 MG/ML IV SOLN   COMPARISON:  Left foot x-rays dated June 30, 2020.   FINDINGS: Bones/Joint/Cartilage   Bony destruction, marrow edema, enhancement, and corresponding decreased T1 marrow signal involving the third distal phalanx. No fracture or dislocation. No suspicious bone lesion. No joint effusion.   Ligaments   Collateral ligaments are intact.  Lisfranc ligament is intact.   Muscles and Tendons Flexor and extensor tendons are intact. Increased T2 signal within the intrinsic muscles of the forefoot, nonspecific, but likely related to diabetic muscle changes.   Soft tissue Prominent soft tissue swelling and enhancement of the third toe. Dorsal forefoot soft tissue swelling. No fluid collection or hematoma. No soft tissue mass.   IMPRESSION: 1. Cellulitis of the third toe with osteomyelitis of the third distal phalanx. No abscess.     Electronically Signed   By: Obie Dredge M.D.   On: 07/03/2020 10:22      Assessment:   1. Chronic osteomyelitis of toe of left foot (HCC)   2. Hammer toe of right foot   3. Diabetic  polyneuropathy associated with type 2 diabetes mellitus (HCC)   4. Type 2 diabetes mellitus with other circulatory complications (HCC)      Plan:  Patient was evaluated and treated and all questions answered.   -Continue doxycycline - Today we discussed and reviewed her MRI results. I recommended partial 3rd toe amputation of the distal and middle phalanges. The middle phalanx has enough T1 signal loss that I think it would be prudent to have definitive surgical cure of the osteomyelitis and prevent return to the OR. Discussed risks/benefits/complications/post operative course. All questions addressed. Informed consent reviewed and signed.   Surgical plan:  Procedure: -left 3rd toe partial amputation  Location: -[facility]  Anesthesia plan: -IV sedation with local anesthesia  Postoperative pain plan: - Tylenol 1000 mg every 6 hours, ibuprofen 600 mg every 6 hours, gabapentin 300 mg every 8 hours x5 days, oxycodone 5 mg 1-2 tabs every 6 hours only as needed  DVT prophylaxis: -none, will be WBAT  WB Restrictions / DME needs: -she has a surgical shoe and will bring this to surgery

## 2020-07-07 NOTE — Telephone Encounter (Signed)
Clinical info completed on surgery form.  Place form in Dr. McDiarmid's box for completion.  Marisue Canion, CMA

## 2020-07-07 NOTE — Telephone Encounter (Signed)
Patient called. Had lab work and MRI done. She would like to know the results of each.

## 2020-07-12 ENCOUNTER — Inpatient Hospital Stay (HOSPITAL_COMMUNITY): Admission: RE | Admit: 2020-07-12 | Payer: BC Managed Care – PPO | Source: Ambulatory Visit

## 2020-07-12 ENCOUNTER — Encounter: Payer: Self-pay | Admitting: Family Medicine

## 2020-07-12 NOTE — Progress Notes (Signed)
Received completed form in nurse box. Printed off office visit note from 8/12 and will fax to [facility] at [phone]

## 2020-07-12 NOTE — Telephone Encounter (Signed)
H&P form completed.  Form placed in RN Triage Box

## 2020-07-12 NOTE — H&P (View-Only) (Signed)
H&P form completed at request of [facility]. Form completed based on diabetes office visit on 06/16/20 A/ Low risk of serious complication from amputation of osteomyelitic digit per ACP NSQIP surgical risk calculator High risk for site infection due to diabetes and obesity.

## 2020-07-12 NOTE — Progress Notes (Signed)
H&P form completed at request of [facility]. Form completed based on diabetes office visit on 06/16/20 A/ Low risk of serious complication from amputation of osteomyelitic digit per ACP NSQIP surgical risk calculator High risk for site infection due to diabetes and obesity.  

## 2020-07-13 ENCOUNTER — Telehealth: Payer: Self-pay

## 2020-07-13 ENCOUNTER — Other Ambulatory Visit (HOSPITAL_COMMUNITY)
Admission: RE | Admit: 2020-07-13 | Discharge: 2020-07-13 | Disposition: A | Discharge status: Home / Self Care | Payer: BC Managed Care – PPO | Source: Ambulatory Visit | Attending: Podiatry | Admitting: Podiatry

## 2020-07-13 DIAGNOSIS — Z01812 Encounter for preprocedural laboratory examination: Secondary | ICD-10-CM | POA: Diagnosis not present

## 2020-07-13 DIAGNOSIS — Z20822 Contact with and (suspected) exposure to covid-19: Secondary | ICD-10-CM | POA: Insufficient documentation

## 2020-07-13 LAB — SARS CORONAVIRUS 2 (TAT 6-24 HRS): SARS Coronavirus 2: NEGATIVE

## 2020-07-13 NOTE — Telephone Encounter (Signed)
DOS 07/15/20  AMPUTATION TOE IPJ 3RD LT - 96886  [employer] EFFECTIVE DATE - 11/05/2018  PLAN DEDUCTIBLE - $2500.00 W/ $383.00 REMAINING OUT OF POCKET - $6000.00 W/ $3859.00 REMAINING COPAY $0.00 COINSURANCE - 30%  SPOKE TO TOSHA AT [employer] - CALL REF # [id number] 07/13/20 - NO PRECERT REQUIRED FOR CPT 48472

## 2020-07-14 ENCOUNTER — Other Ambulatory Visit: Payer: Self-pay

## 2020-07-14 ENCOUNTER — Encounter (HOSPITAL_COMMUNITY): Payer: Self-pay | Admitting: Podiatry

## 2020-07-14 NOTE — Anesthesia Preprocedure Evaluation (Addendum)
Anesthesia Evaluation  Patient identified by MRN, date of birth, ID band Patient awake    Reviewed: Patient's Chart, lab work & pertinent test results, reviewed documented beta blocker date and time   Airway Mallampati: III  TM Distance: >3 FB Neck ROM: Full    Dental  (+) Missing, Edentulous Upper, Chipped   Pulmonary sleep apnea and Continuous Positive Airway Pressure Ventilation ,    Pulmonary exam normal        Cardiovascular hypertension, Pt. on medications and Pt. on home beta blockers + CAD and + Past MI   Rhythm:Regular Rate:Normal     Neuro/Psych    GI/Hepatic Neg liver ROS, GERD  ,  Endo/Other  diabetes, Type 2, Insulin Dependent, Oral Hypoglycemic Agents  Renal/GU      Musculoskeletal  (+) Arthritis ,   Abdominal (+) + obese,   Bowel sounds: normal.  Peds  Hematology   Anesthesia Other Findings   Reproductive/Obstetrics                            Anesthesia Physical Anesthesia Plan  ASA: III  Anesthesia Plan: MAC   Post-op Pain Management:    Induction:   PONV Risk Score and Plan: 2 and Ondansetron and Dexamethasone  Airway Management Planned: Simple Face Mask and Nasal Cannula  Additional Equipment: None  Intra-op Plan:   Post-operative Plan:   Informed Consent: I have reviewed the patients History and Physical, chart, labs and discussed the procedure including the risks, benefits and alternatives for the proposed anesthesia with the patient or authorized representative who has indicated his/her understanding and acceptance.       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  - Admitted 02/10/16 with acute respiratory distress and acute on chronic diastolic CHF. Bedside echo showed depressed LVEF and IVC dynamics consistent with volume overload. She required intubation due to declining mental status and poor respiratory effort. Labs consistent with NSTEMI. Cardiology consulted. 2D echo showed LVEF 40-45% with apical and inferoapical akinesis and question of MV mass. Cardiac cath performed 02/15/16 and underwent DES mLAD and PTCA D1 with plans for staged RCA PCI (s/p mRCA DES 03/14/16). It was not felt that she could tolerate TEE at that time, so discharged 02/17/16, but re-admitted 02/19/16 with stridor. ENT Izora Gala, MD consulted with arytenoid edema noted on flexible fiberoptic laryngoscopy. Probable subglottic edema, possibly early stenosis following recent intubation. CT neck showed up to 50% tracheal narrowing. Initially treated medically, but required intubation that evening (Glidescope 4, 7.0 ETT). ACE-I discontinued (presumably due to question so angioedema). TEE done 02/22/19 and did not show any vegetations or thrombus, EF normalized. Extubated 02/23/19. Discharged 02/25/16 on prednisone taper.  02/19/16 Intubation note (in setting of subglottic edema) by Germain Osgood, MD:  Patient sedated with fentanyl, versed, then etomidate. Easy bag.  On first attempt, airway noted to be anterior, no swelling noted, difficulty feeding ETT with glidescope stylet, so Bougie advanced through the cords. As tube was being fed, Bougie was displaced. Attempt aborted.  Additional medication with 73m propofol and additional 2103metomidate.  2nd look again  with good visualization, easy passage of Bougie, then easy passage of 7.0 ETT through the cords to a depth of 23 cm at the teeth. Bougie removed. + chest rise, good color change. +bilateral breath sounds. No sounds over  the epigastrium.   Hx of OSA, per recent PCP notes pt is not using prescribed CPAP device.   She has known CAD, CHF (with normalization of EF 02/2016). PCI in 2017. Last seen by cardiologist Dr. Radford Pax 06/25/19. Per note, doing well at that time.  Preop clearance from PCP Dr. Sherren Mocha McDiarmid 07/12/20 states, "H&P form completed at request of [facility]. Form completed based on diabetes office visit on 06/16/20 A/ Low risk of serious complication from amputation of osteomyelitic digit per ACP NSQIP surgical risk calculator. High risk for site infection due to diabetes and obesity."  Will need DOS labs, eval, and EKG.   EKG 05/21/19: Normal sinus rhythm. Rate 80. Low voltage QRS  TEE 02/22/16 (to evaluate for mass/thrombus, see 02/13/16 TTE): Study Conclusions - Left ventricle: Hypertrophy was noted. Systolic function was normal. The estimated ejection fraction was in the range of 55% to 60%. - Aortic valve: No evidence of vegetation. - Mitral valve: No mass or vegetation seen. No evidence of vegetation. - Right atrium: No evidence of thrombus in the atrial cavity or appendage. - Atrial septum: No defect or patent foramen ovale was identified. - Pulmonic valve: No evidence of vegetation.   TTE 02/13/16 (in setting of acute respiratory failure, NSTEMI, pre-PCI): Study Conclusions - Left ventricle: The cavity size was normal. Wall thickness was increased in a pattern of severe LVH. Systolic function was mildly to moderately reduced. The estimated ejection fraction was in the range of 40% to 45%. Apical and inferoapical akinesis. No mural thrombus with Definity. Doppler parameters are consistent with pseudonormal left ventricular relaxation (grade 2 diastolic dysfunction). The E/e&' ratio is >20, suggesting markedly elevated LV filling pressure. - Mitral valve: Calcified annulus. 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 This could represent thrombus or vegetation, or could be artifactual. There was trivial regurgitation. - Left atrium: The atrium was mildly dilated. - Right ventricle: The cavity size was normal. Wall thickness was normal. Systolic function was normal. - Right atrium: The atrium was mildly dilated. - Atrial septum: No defect or patent foramen ovale was identified. - Inferior vena cava: The vessel was normal in size. The respirophasic diameter changes were in the normal range (= 50%), consistent with normal central venous pressure. Impressions: - Compared to a recent echo on 02/10/2016, the LVEF is lower at 40-45% with apical and inferoapical akinesis. No mural thrombus seen with Definity. There is a questionable prolapsing mass on the mitral valve, but only seen in the 2 chamber view, trivial associated AI - this may be artifact or possibly thrombus. Recommend further evalution by TEE to exclude thrombus given reduced LV function and apical wall motion abnormality.  Cardiac cath 02/15/16: CONCLUSION: Ost Cx to Prox Cx lesion, 20% stenosed. Ost LAD lesion, 50% stenosed. 2nd Mrg lesion, 40% stenosed. Ost 3rd Mrg to 3rd Mrg lesion, 50% stenosed. Ost 2nd Diag to 2nd Diag lesion, 70% stenosed. Mid RCA lesion, 85% stenosed. Dist RCA lesion, 30% stenosed. Ost RPDA to RPDA lesion, 50% stenosed. Mid LAD to Dist LAD lesion, 99% stenosed. Post intervention, there is a 0% residual  stenosis. Ost 1st Diag to 1st Diag lesion, 99% stenosed. Post intervention, there is a 40% residual stenosis. 1. Severe two-vessel coronary artery disease involving the mid to distal LAD, large first diagonal and mid right coronary artery. 2. Moderately elevated left ventricular end-diastolic pressure with an LVEDP of 22 mmHg. Left ventricular angiography was not performed due to recent renal failure  and known EF by echo. 3.Successful angioplasty and drug-eluting stent placement to the mid LAD and balloon angioplasty of the first diagonal. Recommendations: Dual antiplatelet therapy for at least one year. Staged RCA PCI in 3-4 weeks.  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 Avoid the right radial artery catheterization due to dual radial system.  PCI 02/15/16: PTCA/DES to mid LAD and balloon angioplasty of D1 PCI 03/14/16: PTCA/DES to mid RCA. The distal RCA has 50% diffuse disease into the right PDA which has 70% stenosis. These were left to be treated medically.   )       Anesthesia Quick Evaluation

## 2020-07-14 NOTE — Progress Notes (Signed)
Spoke with pt for pre-op call. Pt has hx of CAD with stents in 2017, with acute respiratory failure. She saw Dr. Kirke Corin up until a couple of years ago. See Allison's note from 05/2019. Pt denies any recent chest pain or sob. PCP is Dr. Tawanna Cooler McDiarmid. Have requested Dr. Kandice Hams office to send H&P to [place]. Notes are in Epic, no vitals noted.  Pt is a type 2 diabetic. Last A1C was 6.5 on 06/16/20. Pt states her fasting blood sugar is usually between 125-140.  Instructed pt not to take her Humalog insulin, Farxiga or Metformin the morning of surgery. Instructed pt to check her blood sugar when she gets up in the AM and every 2 hours until she leaves for the hospital. If blood sugar is 70 or below, treat with 1/2 cup of clear juice (apple or cranberry) and recheck blood sugar 15 minutes after drinking juice. If blood sugar continues to be 70 or below, call the Short Stay department and ask to speak to a nurse. She voiced understanding.  Covid test done 07/13/20 and it's negative. Pt states she has been in quarantine since the test was done and understands that she stays in quarantine until she comes to the hospital in the AM.

## 2020-07-14 NOTE — Progress Notes (Signed)
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  - Admitted 02/10/16 with acute respiratory distress and acute on chronic diastolic CHF. Bedside echo showed depressed LVEF and IVC dynamics consistent with volume overload. She required intubation due to declining mental status and poor respiratory effort. Labs consistent with NSTEMI. Cardiology consulted. 2D echo showed LVEF 40-45% with apical and inferoapical akinesis and question of MV mass. Cardiac cath performed 02/15/16 and underwent DES mLAD and PTCA D1 with plans for staged RCA PCI (s/p mRCA DES 03/14/16). It was not felt that she could tolerate TEE at that time, so discharged 02/17/16, but re-admitted 02/19/16 with stridor. ENT Serena Colonel, MD consulted with arytenoid edema noted on flexible fiberoptic laryngoscopy. Probable subglottic edema, possibly early stenosis following recent intubation. CT neck showed up to 50% tracheal narrowing. Initially treated medically, but required intubation that evening (Glidescope 4, 7.0 ETT). ACE-I discontinued (presumably due to question so angioedema). TEE done 02/22/19 and did not show any vegetations or thrombus, EF normalized. Extubated 02/23/19. Discharged 02/25/16 on prednisone taper.  02/19/16 Intubation note (in setting of subglottic edema) by Apolinar Junes, MD:  Patient sedated with fentanyl, versed, then etomidate. Easy bag.  On first attempt, airway noted to be anterior, no swelling noted, difficulty feeding ETT with glidescope stylet, so Bougie advanced through the cords. As tube was being fed, Bougie was displaced. Attempt aborted.  Additional  medication with 40mg  propofol and additional 20mg  etomidate.  2nd look again with good visualization, easy passage of Bougie, then easy passage of 7.0 ETT through the cords to a depth of 23 cm at the teeth. Bougie removed. + chest rise, good color change. +bilateral breath sounds. No sounds over the epigastrium.   Hx of OSA, per recent PCP notes pt is not using prescribed CPAP device.   She has known CAD, CHF (with normalization of EF 02/2016). PCI in 2017. Last seen by cardiologist Dr. 03/2016 06/25/19. Per note, doing well at that time.  Preop clearance from PCP Dr. Mayford Knife McDiarmid 07/12/20 states, "H&P form completed at request of [facility]. Form completed based on diabetes office visit on 06/16/20 A/ Low risk of serious complication from amputation of osteomyelitic digit per ACP NSQIP surgical risk calculator. High risk for site infection due to diabetes and obesity."  Will need DOS labs, eval, and EKG.   EKG 05/21/19: Normal sinus rhythm. Rate 80. Low voltage QRS  TEE 02/22/16 (to evaluate for mass/thrombus, see 02/13/16 TTE): Study Conclusions - Left ventricle: Hypertrophy was noted. Systolic function was normal. The estimated ejection fraction was in the range of 55% to 60%. - Aortic valve: No evidence of vegetation. - Mitral valve: No mass or vegetation seen. No evidence of vegetation. - Right atrium: No evidence of thrombus in the atrial cavity or appendage. - Atrial septum: No defect or patent foramen ovale was identified. - Pulmonic valve: No evidence of vegetation.   TTE 02/13/16 (in setting of acute respiratory failure, NSTEMI, pre-PCI): Study Conclusions - Left ventricle: The cavity size was normal. Wall thickness was increased in a pattern of severe LVH. Systolic function was mildly to moderately reduced. The estimated ejection fraction was in the range of 40% to 45%.  Apical and inferoapical akinesis. No mural thrombus with Definity. Doppler  parameters are consistent with pseudonormal left ventricular relaxation (grade 2 diastolic dysfunction). The E/e&' ratio is >20, suggesting markedly elevated LV filling pressure. - Mitral valve: Calcified annulus. There appears to be a mobile mass on the atrial side of the mitral valve which prolapses, measuring 3.0 x 1.5 cm, seen in the 2 chamber view, but not well in other view. This could represent thrombus or vegetation, or could be artifactual. There was trivial regurgitation. - Left atrium: The atrium was mildly dilated. - Right ventricle: The cavity size was normal. Wall thickness was normal. Systolic function was normal. - Right atrium: The atrium was mildly dilated. - Atrial septum: No defect or patent foramen ovale was identified. - Inferior vena cava: The vessel was normal in size. The respirophasic diameter changes were in the normal range (= 50%), consistent with normal central venous pressure. Impressions: - Compared to a recent echo on 02/10/2016, the LVEF is lower at 40-45% with apical and inferoapical akinesis. No mural thrombus seen with Definity. There is a questionable prolapsing mass on the mitral valve, but only seen in the 2 chamber view, trivial associated AI - this may be artifact or possibly thrombus. Recommend further evalution by TEE to exclude thrombus given reduced LV function and apical wall motion abnormality.  Cardiac cath 02/15/16: CONCLUSION:  Ost Cx to Prox Cx lesion, 20% stenosed.  Ost LAD lesion, 50% stenosed.  2nd Mrg lesion, 40% stenosed.  Ost 3rd Mrg to 3rd Mrg lesion, 50% stenosed.  Ost 2nd Diag to 2nd Diag lesion, 70% stenosed.  Mid RCA lesion, 85% stenosed.  Dist RCA lesion, 30% stenosed.  Ost RPDA to RPDA lesion, 50% stenosed.  Mid LAD to Dist LAD lesion, 99% stenosed. Post intervention, there is a 0% residual stenosis.  Ost 1st Diag to 1st Diag lesion, 99% stenosed. Post intervention, there is a  40% residual stenosis. 1. Severe two-vessel coronary artery disease involving the mid to distal LAD, large first diagonal and mid right coronary artery. 2. Moderately elevated left ventricular end-diastolic pressure with an LVEDP of 22 mmHg. Left ventricular angiography was not performed due to recent renal failure and known EF by echo. 3.Successful angioplasty and drug-eluting stent placement to the mid LAD and balloon angioplasty of the first diagonal. Recommendations: Dual antiplatelet therapy for at least one year. Staged RCA PCI in 3-4 weeks.  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 Avoid the right radial artery catheterization due to dual radial system.  PCI 02/15/16: PTCA/DES to mid LAD and balloon angioplasty of D1 PCI 03/14/16: PTCA/DES to mid RCA. The distal RCA has 50% diffuse disease into the right PDA which has 70% stenosis. These were left to be treated medically.    Zannie Cove [facility] Short Stay Center/Anesthesiology Phone [phone] 07/14/2020 4:38 PM

## 2020-07-15 ENCOUNTER — Ambulatory Visit (HOSPITAL_COMMUNITY): Payer: BC Managed Care – PPO | Admitting: Physician Assistant

## 2020-07-15 ENCOUNTER — Ambulatory Visit (HOSPITAL_COMMUNITY): Payer: BC Managed Care – PPO

## 2020-07-15 ENCOUNTER — Encounter (HOSPITAL_COMMUNITY): Payer: Self-pay | Admitting: Podiatry

## 2020-07-15 ENCOUNTER — Ambulatory Visit (HOSPITAL_COMMUNITY)
Admission: RE | Admit: 2020-07-15 | Discharge: 2020-07-15 | Disposition: A | Discharge status: Home / Self Care | Payer: BC Managed Care – PPO | Attending: Podiatry | Admitting: Podiatry

## 2020-07-15 ENCOUNTER — Encounter (HOSPITAL_COMMUNITY)
Admission: RE | Disposition: A | Discharge status: Home / Self Care | Payer: Self-pay | Source: Home / Self Care | Attending: Podiatry

## 2020-07-15 DIAGNOSIS — I110 Hypertensive heart disease with heart failure: Secondary | ICD-10-CM | POA: Diagnosis not present

## 2020-07-15 DIAGNOSIS — G4733 Obstructive sleep apnea (adult) (pediatric): Secondary | ICD-10-CM | POA: Insufficient documentation

## 2020-07-15 DIAGNOSIS — E669 Obesity, unspecified: Secondary | ICD-10-CM | POA: Insufficient documentation

## 2020-07-15 DIAGNOSIS — Z6841 Body Mass Index (BMI) 40.0 and over, adult: Secondary | ICD-10-CM | POA: Diagnosis not present

## 2020-07-15 DIAGNOSIS — E1159 Type 2 diabetes mellitus with other circulatory complications: Secondary | ICD-10-CM | POA: Insufficient documentation

## 2020-07-15 DIAGNOSIS — E1169 Type 2 diabetes mellitus with other specified complication: Secondary | ICD-10-CM | POA: Insufficient documentation

## 2020-07-15 DIAGNOSIS — Z794 Long term (current) use of insulin: Secondary | ICD-10-CM | POA: Diagnosis not present

## 2020-07-15 DIAGNOSIS — I2510 Atherosclerotic heart disease of native coronary artery without angina pectoris: Secondary | ICD-10-CM | POA: Diagnosis not present

## 2020-07-15 DIAGNOSIS — M86672 Other chronic osteomyelitis, left ankle and foot: Secondary | ICD-10-CM | POA: Insufficient documentation

## 2020-07-15 DIAGNOSIS — I252 Old myocardial infarction: Secondary | ICD-10-CM | POA: Diagnosis not present

## 2020-07-15 DIAGNOSIS — M869 Osteomyelitis, unspecified: Secondary | ICD-10-CM | POA: Diagnosis not present

## 2020-07-15 DIAGNOSIS — Z9889 Other specified postprocedural states: Secondary | ICD-10-CM

## 2020-07-15 DIAGNOSIS — I5042 Chronic combined systolic (congestive) and diastolic (congestive) heart failure: Secondary | ICD-10-CM | POA: Diagnosis not present

## 2020-07-15 DIAGNOSIS — Z955 Presence of coronary angioplasty implant and graft: Secondary | ICD-10-CM | POA: Diagnosis not present

## 2020-07-15 DIAGNOSIS — Z79899 Other long term (current) drug therapy: Secondary | ICD-10-CM | POA: Diagnosis not present

## 2020-07-15 DIAGNOSIS — M7732 Calcaneal spur, left foot: Secondary | ICD-10-CM | POA: Diagnosis not present

## 2020-07-15 DIAGNOSIS — E785 Hyperlipidemia, unspecified: Secondary | ICD-10-CM | POA: Diagnosis not present

## 2020-07-15 HISTORY — DX: Gastro-esophageal reflux disease without esophagitis: K21.9

## 2020-07-15 HISTORY — PX: AMPUTATION: SHX166

## 2020-07-15 LAB — CBC
HCT: 42 % (ref 36.0–46.0)
Hemoglobin: 12 g/dL (ref 12.0–15.0)
MCH: 23.3 pg — ABNORMAL LOW (ref 26.0–34.0)
MCHC: 28.6 g/dL — ABNORMAL LOW (ref 30.0–36.0)
MCV: 81.7 fL (ref 80.0–100.0)
Platelets: 190 10*3/uL (ref 150–400)
RBC: 5.14 MIL/uL — ABNORMAL HIGH (ref 3.87–5.11)
RDW: 18.4 % — ABNORMAL HIGH (ref 11.5–15.5)
WBC: 8.4 10*3/uL (ref 4.0–10.5)
nRBC: 0 % (ref 0.0–0.2)

## 2020-07-15 LAB — BASIC METABOLIC PANEL
Anion gap: 13 (ref 5–15)
BUN: 9 mg/dL (ref 6–20)
CO2: 26 mmol/L (ref 22–32)
Calcium: 10.3 mg/dL (ref 8.9–10.3)
Chloride: 103 mmol/L (ref 98–111)
Creatinine, Ser: 0.79 mg/dL (ref 0.44–1.00)
GFR calc Af Amer: >60 mL/min (ref >60)
GFR calc non Af Amer: >60 mL/min (ref >60)
Glucose, Bld: 124 mg/dL — ABNORMAL HIGH (ref 70–99)
Potassium: 4.4 mmol/L (ref 3.5–5.1)
Sodium: 142 mmol/L (ref 135–145)

## 2020-07-15 LAB — GLUCOSE, CAPILLARY
Glucose-Capillary: 121 mg/dL — ABNORMAL HIGH (ref 70–99)
Glucose-Capillary: 114 mg/dL — ABNORMAL HIGH (ref 70–99)
Glucose-Capillary: 114 mg/dL — ABNORMAL HIGH (ref 70–99)

## 2020-07-15 SURGERY — AMPUTATION DIGIT
Anesthesia: Monitor Anesthesia Care | Laterality: Left

## 2020-07-15 MED ORDER — CHLORHEXIDINE GLUCONATE CLOTH 2 % EX PADS: 6.0000 | MEDICATED_PAD | Freq: Once | CUTANEOUS | Status: DC

## 2020-07-15 MED ORDER — MIDAZOLAM HCL 2 MG/2ML IJ SOLN
INTRAMUSCULAR | Status: AC
  Filled 2020-07-15: qty 2

## 2020-07-15 MED ORDER — ONDANSETRON HCL 4 MG/2ML IJ SOLN
INTRAMUSCULAR | Status: DC | PRN
  Administered 2020-07-15: 4 mg via INTRAVENOUS

## 2020-07-15 MED ORDER — CEFAZOLIN SODIUM-DEXTROSE 2-4 GM/100ML-% IV SOLN
INTRAVENOUS | Status: AC
  Filled 2020-07-15: qty 100

## 2020-07-15 MED ORDER — 0.9 % SODIUM CHLORIDE (POUR BTL) OPTIME
TOPICAL | Status: DC | PRN
  Administered 2020-07-15: 1000 mL

## 2020-07-15 MED ORDER — ACETAMINOPHEN 500 MG PO TABS
ORAL_TABLET | ORAL | Status: AC
  Filled 2020-07-15: qty 2

## 2020-07-15 MED ORDER — FENTANYL CITRATE (PF) 100 MCG/2ML IJ SOLN
INTRAMUSCULAR | Status: DC
Start: 2020-07-15 — End: 2020-07-15
  Filled 2020-07-15: qty 2

## 2020-07-15 MED ORDER — LIDOCAINE 2% (20 MG/ML) 5 ML SYRINGE
INTRAMUSCULAR | Status: DC | PRN
  Administered 2020-07-15: 60 mg via INTRAVENOUS

## 2020-07-15 MED ORDER — BUPIVACAINE HCL 0.5 % IJ SOLN
INTRAMUSCULAR | Status: DC | PRN
  Administered 2020-07-15: 8 mL

## 2020-07-15 MED ORDER — ORAL CARE MOUTH RINSE: 15.0000 mL | Freq: Once | OROMUCOSAL | Status: AC

## 2020-07-15 MED ORDER — PROPOFOL 10 MG/ML IV BOLUS
INTRAVENOUS | Status: DC | PRN
  Administered 2020-07-15: 20 mg via INTRAVENOUS

## 2020-07-15 MED ORDER — CEFAZOLIN SODIUM-DEXTROSE 2-3 GM-%(50ML) IV SOLR
INTRAVENOUS | Status: DC | PRN
  Administered 2020-07-15: 2 g via INTRAVENOUS

## 2020-07-15 MED ORDER — CHLORHEXIDINE GLUCONATE 0.12 % MT SOLN
15.0000 mL | Freq: Once | OROMUCOSAL | Status: AC
  Administered 2020-07-15: 15 mL via OROMUCOSAL
  Filled 2020-07-15: qty 15

## 2020-07-15 MED ORDER — LACTATED RINGERS IV SOLN: INTRAVENOUS | Status: DC

## 2020-07-15 MED ORDER — HYDROCODONE-ACETAMINOPHEN 5-325 MG PO TABS: ORAL_TABLET | ORAL | 0 refills | Status: DC

## 2020-07-15 MED ORDER — PROPOFOL 500 MG/50ML IV EMUL
INTRAVENOUS | Status: DC | PRN
  Administered 2020-07-15: 50 ug/kg/min via INTRAVENOUS

## 2020-07-15 MED ORDER — DOXYCYCLINE HYCLATE 50 MG PO CAPS: 50.0000 mg | ORAL_CAPSULE | Freq: Two times a day (BID) | ORAL | 0 refills | Status: DC

## 2020-07-15 MED ORDER — MIDAZOLAM HCL 2 MG/2ML IJ SOLN
INTRAMUSCULAR | Status: DC | PRN
  Administered 2020-07-15: 2 mg via INTRAVENOUS

## 2020-07-15 MED ORDER — CEFAZOLIN SODIUM-DEXTROSE 1-4 GM/50ML-% IV SOLN
INTRAVENOUS | Status: AC
  Filled 2020-07-15: qty 50

## 2020-07-15 MED ORDER — BUPIVACAINE HCL (PF) 0.5 % IJ SOLN
INTRAMUSCULAR | Status: AC
  Filled 2020-07-15: qty 30

## 2020-07-15 SURGICAL SUPPLY — 33 items
BLADE LONG MED 31MMX9MM (MISCELLANEOUS) ×1
BLADE LONG MED 31X9 (MISCELLANEOUS) ×2 IMPLANT
BNDG ELASTIC 4X5.8 VLCR STR LF (GAUZE/BANDAGES/DRESSINGS) ×3 IMPLANT
BNDG GAUZE ELAST 4 BULKY (GAUZE/BANDAGES/DRESSINGS) ×3 IMPLANT
COVER SURGICAL LIGHT HANDLE (MISCELLANEOUS) ×3 IMPLANT
COVER WAND RF STERILE (DRAPES) ×3 IMPLANT
DRSG EMULSION OIL 3X3 NADH (GAUZE/BANDAGES/DRESSINGS) ×3 IMPLANT
ELECT REM PT RETURN 9FT ADLT (ELECTROSURGICAL) ×3
ELECTRODE REM PT RTRN 9FT ADLT (ELECTROSURGICAL) ×1 IMPLANT
GAUZE SPONGE 4X4 12PLY STRL (GAUZE/BANDAGES/DRESSINGS) ×3 IMPLANT
GAUZE SPONGE 4X4 12PLY STRL LF (GAUZE/BANDAGES/DRESSINGS) ×2 IMPLANT
GAUZE XEROFORM 5X9 LF (GAUZE/BANDAGES/DRESSINGS) ×2 IMPLANT
GLOVE BIO SURGEON STRL SZ7.5 (GLOVE) ×3 IMPLANT
GLOVE BIOGEL PI IND STRL 8 (GLOVE) ×1 IMPLANT
GLOVE BIOGEL PI INDICATOR 8 (GLOVE) ×2
GOWN STRL REUS W/ TWL LRG LVL3 (GOWN DISPOSABLE) ×1 IMPLANT
GOWN STRL REUS W/ TWL XL LVL3 (GOWN DISPOSABLE) ×1 IMPLANT
GOWN STRL REUS W/TWL LRG LVL3 (GOWN DISPOSABLE) ×3
GOWN STRL REUS W/TWL XL LVL3 (GOWN DISPOSABLE) ×3
KIT BASIN OR (CUSTOM PROCEDURE TRAY) ×3 IMPLANT
KIT TURNOVER KIT B (KITS) ×3 IMPLANT
NDL HYPO 25GX1X1/2 BEV (NEEDLE) ×1 IMPLANT
NEEDLE HYPO 25GX1X1/2 BEV (NEEDLE) ×3 IMPLANT
NS IRRIG 1000ML POUR BTL (IV SOLUTION) ×3 IMPLANT
PACK ORTHO EXTREMITY (CUSTOM PROCEDURE TRAY) ×3 IMPLANT
PAD ARMBOARD 7.5X6 YLW CONV (MISCELLANEOUS) ×3 IMPLANT
SOL PREP POV-IOD 4OZ 10% (MISCELLANEOUS) ×3 IMPLANT
SUT ETHILON 3 0 PS 1 (SUTURE) ×3 IMPLANT
SYR CONTROL 10ML LL (SYRINGE) ×3 IMPLANT
TOWEL GREEN STERILE (TOWEL DISPOSABLE) ×3 IMPLANT
TUBE CONNECTING 12'X1/4 (SUCTIONS) ×1
TUBE CONNECTING 12X1/4 (SUCTIONS) ×2 IMPLANT
YANKAUER SUCT BULB TIP NO VENT (SUCTIONS) ×3 IMPLANT

## 2020-07-15 NOTE — Transfer of Care (Signed)
Immediate Anesthesia Transfer of Care Note  Patient: [name]  Procedure(s) Performed: AMPUTATION PARTIAL LEFT 3RD TOE (Left )  Patient Location: PACU  Anesthesia Type:MAC  Level of Consciousness: awake, alert , oriented and patient cooperative  Airway & Oxygen Therapy: Patient Spontanous Breathing and Patient connected to face mask oxygen  Post-op Assessment: Report given to RN and Post -op Vital signs reviewed and stable  Post vital signs: Reviewed and stable  Last Vitals:  Vitals Value Taken Time  BP 114/57 07/15/20 1520  Temp    Pulse 56 07/15/20 1520  Resp 23 07/15/20 1520  SpO2 99 % 07/15/20 1520  Vitals shown include unvalidated device data.  Last Pain:  Vitals:   07/15/20 1124  TempSrc:   PainSc: 0-No pain      Patients Stated Pain Goal: 3 (79/02/40 9735)  Complications: No complications documented.

## 2020-07-15 NOTE — Discharge Instructions (Signed)
Post-Surgery Instructions  1. If you are recuperating from surgery anywhere other than home, please be sure to leave [place] a number where you can be reached. 2. Go directly home and rest. 3. The keep operated foot (or feet) elevated six inches above the hip when sitting or lying down. 4. Support the elevated foot and leg with pillows under the calf. DO NOT PLACE PILLOWS UNDER THE KNEE. 5. DO NOT REMOVE or get your bandages wet. This will increase your chances of getting an infection. 6. Wear your surgical shoe at all times when you are up. 7. A limited amount of pain and swelling may occur. The skin may take on a bruised appearance. This is no cause for alarm. 8. For slight pain and swelling, apply an ice pack directly over the bandage for 15 minutes every hour. Continue icing until seen in the office. DO NOT apply any form of heat to the area. 9. Have prescription(s) filled immediately and take as directed. 10. Drink lots of liquids, water, and juice. 11. CALL THE OFFICE IMMEDIATELY IF: a. Bleeding continues b. Pain increases and/or does not respond to medication c. Bandage or cast appears too tight d. Any liquids (water, coffee, etc.) have spilled on your bandages. e. Tripping, falling, or stubbing the surgical foot f. If your temperature rises above 101 g. If you have ANY questions at all 12. Please use the crutches, knee scooter, or walker you have prescribed, rented, or purchased. If you are non-weight bearing DO NOT put weight on the operated foot for _________ days. If you are weight-bearing, follow your physician's instructions. You are expected to be: X weight-bearing ? non-weight bearing 13. Special Instructions: _____________________________________________________________ _________________________________________________________________________________ _________________________________________________________________________________  14. Your next appointment is: 07/21/2020  10:00 AM  If you need to reach the nurse for any reason, please call: [place]: [phone] [place]: [phone] [place]: [phone]

## 2020-07-15 NOTE — Interval H&P Note (Signed)
History and Physical Interval Note:  07/15/2020 12:04 PM  [name]  has presented today for surgery, with the diagnosis of OSTEOMYELITIS.  The various methods of treatment have been discussed with the patient and family. After consideration of risks, benefits and other options for treatment, the patient has consented to  Procedure(s): AMPUTATION PARTIAL LEFT 3RD TOE (Left) as a surgical intervention.  The patient's history has been reviewed, patient examined, no change in status, stable for surgery.  I have reviewed the patient's chart and labs.  Questions were answered to the patient's satisfaction.     Edwin Cap

## 2020-07-15 NOTE — Brief Op Note (Signed)
07/15/2020  3:17 PM  PATIENT:  [name]  54 y.o. female  PRE-OPERATIVE DIAGNOSIS:  OSTEOMYELITIS  POST-OPERATIVE DIAGNOSIS:  OSTEOMYELITIS  PROCEDURE:  Procedure(s): AMPUTATION PARTIAL LEFT 3RD TOE (Left)  SURGEON:  Surgeon(s) and Role:    * Mirra Basilio, Rachelle Hora, DPM - Primary  PHYSICIAN ASSISTANT: none  ASSISTANTS: none   ANESTHESIA:   local and IV sedation  EBL:  43mL  BLOOD ADMINISTERED:none  DRAINS: none   LOCAL MEDICATIONS USED:  BUPIVICAINE 7cc  SPECIMEN:  Source of Specimen:  left third toe distal and middle phalanges  DISPOSITION OF SPECIMEN:  PATHOLOGY and micro  COUNTS:  YES  TOURNIQUET:  * No tourniquets in log *  DICTATION: .Note written in EPIC  PLAN OF CARE: Discharge to home after PACU  PATIENT DISPOSITION:  PACU - hemodynamically stable.   Delay start of Pharmacological VTE agent (>24hrs) due to surgical blood loss or risk of bleeding: no  WBAT in surgical shoe  Begin PO antibiotics at home today

## 2020-07-16 NOTE — Op Note (Signed)
Patient Name: TARISA PAOLA DOB: October 11, 1966  MRN: [id number]   Date of Service: 07/15/2020  Surgeon: Dr. Sharl Ma, DPM Assistants: None Pre-operative Diagnosis:  1. Chronic osteomyelitis of left 3rd toe Post-operative Diagnosis:  1. Chronic osteomyelitis of left 3rd toe Procedures:  1) partial amputation of left 3rd toe at PIPJ Pathology/Specimens: ID Type Source Tests Collected by Time Destination  1 : Left 3rd toe distal and middle phalanx Tissue PATH Digit amputation SURGICAL PATHOLOGY Edwin Cap, DPM 07/15/2020 1448   A : 3rd toe distal phalanx Tissue PATH Bone biopsy AEROBIC/ANAEROBIC CULTURE (SURGICAL/DEEP WOUND) Edwin Cap, DPM 07/15/2020 1454    Anesthesia: IV sedation with local anesthesia Hemostasis: * No tourniquets in log * Estimated Blood Loss: 5 mL Materials: * No implants in log * Medications: 7cc marcaine plain Complications: none  Indications for Procedure:  This is a 54 y.o. female with a history of left 3rd toe swelling and redness. MRI revealed osteomyelitis of the 3rd left distal phalanx. I recommended amputation in the OR and we discussed the risks, benefits, complications, and expected postoperative course. Informed consent was signed in the office and was available on the day of surgery for review.   Procedure in Detail: Patient was identified in pre-operative holding area. Formal consent was signed and the left lower extremity was marked. Patient was brought back to the operating room. Anesthesia was induced. The extremity was prepped and draped in the usual sterile fashion. Timeout was taken to confirm patient name, laterality, and procedure prior to incision.   Attention was then directed to the left third toe where an incision was made over the PIPJ. Dissection was carried down to level of bone.  Dissection was continued to the PIPJ joint and all collateral ligaments were freed at the joint.  The bone and soft tissue attachments of the middle  phalanx were removed and passed for pathology, with the bone culture taken of the distal phalanx.  The remaining proximal phalanx head appeared healthy and viable.  The area was copiously irrigated.  The skin was reapproximated with nylon  The foot was then dressed with Xeroform, 4 x 4 gauze, Kerlix and an Ace wrap under light compression. Patient tolerated the procedure well.   Disposition: Following a period of post-operative monitoring, patient will be transferred to home and she will be weightbearing as tolerated in a surgical shoe.

## 2020-07-18 ENCOUNTER — Encounter (HOSPITAL_COMMUNITY): Payer: Self-pay | Admitting: Podiatry

## 2020-07-18 LAB — SURGICAL PATHOLOGY

## 2020-07-18 NOTE — Anesthesia Postprocedure Evaluation (Signed)
Anesthesia Post Note  Patient: [name]  Procedure(s) Performed: AMPUTATION PARTIAL LEFT 3RD TOE (Left )     Patient location during evaluation: PACU Anesthesia Type: MAC Level of consciousness: awake and alert Pain management: pain level controlled Vital Signs Assessment: post-procedure vital signs reviewed and stable Respiratory status: spontaneous breathing, nonlabored ventilation, respiratory function stable and patient connected to nasal cannula oxygen Cardiovascular status: stable and blood pressure returned to baseline Postop Assessment: no apparent nausea or vomiting Anesthetic complications: no   No complications documented.  Last Vitals:  Vitals:   07/15/20 1533 07/15/20 1550  BP: 127/67 137/61  Pulse: (!) 58 (!) 59  Resp: 15 14  Temp:  36.5 C  SpO2: 95% 92%    Last Pain:  Vitals:   07/15/20 1550  TempSrc:   PainSc: 0-No pain                 Bladyn Tipps

## 2020-07-20 DIAGNOSIS — E119 Type 2 diabetes mellitus without complications: Secondary | ICD-10-CM | POA: Diagnosis not present

## 2020-07-20 LAB — AEROBIC/ANAEROBIC CULTURE W GRAM STAIN (SURGICAL/DEEP WOUND)
Culture: NO GROWTH
Gram Stain: NONE SEEN

## 2020-07-21 ENCOUNTER — Encounter: Payer: Self-pay | Admitting: Family Medicine

## 2020-07-21 ENCOUNTER — Ambulatory Visit: Payer: BC Managed Care – PPO | Admitting: Podiatry

## 2020-07-21 ENCOUNTER — Ambulatory Visit (INDEPENDENT_AMBULATORY_CARE_PROVIDER_SITE_OTHER): Payer: BC Managed Care – PPO | Admitting: Podiatry

## 2020-07-21 ENCOUNTER — Ambulatory Visit (INDEPENDENT_AMBULATORY_CARE_PROVIDER_SITE_OTHER): Payer: BC Managed Care – PPO | Admitting: Family Medicine

## 2020-07-21 ENCOUNTER — Encounter: Payer: Self-pay | Admitting: Podiatry

## 2020-07-21 ENCOUNTER — Other Ambulatory Visit: Payer: Self-pay

## 2020-07-21 VITALS — BP 120/68 | HR 66 | Ht 63.0 in | Wt 237.0 lb

## 2020-07-21 DIAGNOSIS — I10 Essential (primary) hypertension: Secondary | ICD-10-CM | POA: Diagnosis not present

## 2020-07-21 DIAGNOSIS — M86672 Other chronic osteomyelitis, left ankle and foot: Secondary | ICD-10-CM | POA: Diagnosis not present

## 2020-07-21 DIAGNOSIS — Z9889 Other specified postprocedural states: Secondary | ICD-10-CM | POA: Diagnosis not present

## 2020-07-21 DIAGNOSIS — G2581 Restless legs syndrome: Secondary | ICD-10-CM | POA: Diagnosis not present

## 2020-07-21 DIAGNOSIS — D509 Iron deficiency anemia, unspecified: Secondary | ICD-10-CM

## 2020-07-21 DIAGNOSIS — E1140 Type 2 diabetes mellitus with diabetic neuropathy, unspecified: Secondary | ICD-10-CM

## 2020-07-21 DIAGNOSIS — Z0000 Encounter for general adult medical examination without abnormal findings: Secondary | ICD-10-CM | POA: Diagnosis not present

## 2020-07-21 DIAGNOSIS — E1159 Type 2 diabetes mellitus with other circulatory complications: Secondary | ICD-10-CM

## 2020-07-21 MED ORDER — PRAMIPEXOLE DIHYDROCHLORIDE 0.125 MG PO TABS: 0.1250 mg | ORAL_TABLET | Freq: Every evening | ORAL | 0 refills | Status: DC | PRN

## 2020-07-21 NOTE — Patient Instructions (Signed)
Take one pramipexole at night to help with Restless Leg Syndrome.   Take one iron talbet daily to also help with restless leg syndrome.   Use MyChart to let Dr Joselinne Lawal know if the medication is helping.  We can go up on the dose if needed.    Restless Legs Syndrome Restless legs syndrome is a condition that causes uncomfortable feelings or sensations in the legs, especially while sitting or lying down. The sensations usually cause an overwhelming urge to move the legs. The arms can also sometimes be affected. The condition can range from mild to severe. The symptoms often interfere with a person's ability to sleep. What are the causes? The cause of this condition is not known. What increases the risk? The following factors may make you more likely to develop this condition:  Being older than 50.  Pregnancy.  Being a woman. In general, the condition is more common in women than in men.  A family history of the condition.  Having iron deficiency.  Overuse of caffeine, nicotine, or alcohol.  Certain medical conditions, such as kidney disease, Parkinson's disease, or nerve damage.  Certain medicines, such as those for high blood pressure, nausea, colds, allergies, depression, and some heart conditions. What are the signs or symptoms? The main symptom of this condition is uncomfortable sensations in the legs, such as:  Pulling.  Tingling.  Prickling.  Throbbing.  Crawling.  Burning. Usually, the sensations:  Affect both sides of the body.  Are worse when you sit or lie down.  Are worse at night. These may wake you up or make it difficult to fall asleep.  Make you have a strong urge to move your legs.  Are temporarily relieved by moving your legs. The arms can also be affected, but this is rare. People who have this condition often have tiredness during the day because of their lack of sleep at night. How is this diagnosed? This condition may be diagnosed based  on:  Your symptoms.  Blood tests. In some cases, you may be monitored in a sleep lab by a specialist (a sleep study). This can detect any disruptions in your sleep. How is this treated? This condition is treated by managing the symptoms. This may include:  Lifestyle changes, such as exercising, using relaxation techniques, and avoiding caffeine, alcohol, or tobacco.  Medicines. Anti-seizure medicines may be tried first. Follow these instructions at home:     General instructions  Take over-the-counter and prescription medicines only as told by your health care provider.  Use methods to help relieve the uncomfortable sensations, such as: ? Massaging your legs. ? Walking or stretching. ? Taking a cold or hot bath.  Keep all follow-up visits as told by your health care provider. This is important. Lifestyle  Practice good sleep habits. For example, go to bed and get up at the same time every day. Most adults should get 7-9 hours of sleep each night.  Exercise regularly. Try to get at least 30 minutes of exercise most days of the week.  Practice ways of relaxing, such as yoga or meditation.  Avoid caffeine and alcohol.  Do not use any products that contain nicotine or tobacco, such as cigarettes and e-cigarettes. If you need help quitting, ask your health care provider. Contact a health care provider if:  Your symptoms get worse or they do not improve with treatment. Summary  Restless legs syndrome is a condition that causes uncomfortable feelings or sensations in the legs, especially while  sitting or lying down.  The symptoms often interfere with a person's ability to sleep.  This condition is treated by managing the symptoms. You may need to make lifestyle changes or take medicines. This information is not intended to replace advice given to you by your health care provider. Make sure you discuss any questions you have with your health care provider. Document Revised:  11/11/2017 Document Reviewed: 11/11/2017 Elsevier Patient Education  2020 [employer].

## 2020-07-21 NOTE — Patient Instructions (Signed)
Keep the dressings clean, dry and intact  Wear the surgical shoe when walking, you do not have to sleep in it  Finish the entire course of antibiotics as directed

## 2020-07-23 NOTE — Progress Notes (Signed)
°  Subjective:  Patient ID: [name], female    DOB: [birth year],  MRN: [id number]  Chief Complaint  Patient presents with   Routine Post Op      POV #1 DOS 07/15/2020 LT 3RD TOE PARTIAL AMPUTATION    DOS: 07/15/2020 Procedure: Partial amputation left third toe  54 y.o. female returns for post-op check.  Having minimal pain.  Review of Systems: Negative except as noted in the HPI. Denies N/V/F/Ch.   Objective:  There were no vitals filed for this visit. There is no height or weight on file to calculate BMI. Constitutional Well developed. Well nourished.  Vascular Foot warm and well perfused. Capillary refill normal to all digits.   Neurologic Normal speech. Oriented to person, place, and time.   Dermatologic Skin healing well without signs of infection. Skin edges well coapted without signs of infection.  Orthopedic: Tenderness to palpation noted about the surgical site.    Assessment:   1. Chronic osteomyelitis of toe of left foot (HCC)   2. Post-operative state    Plan:  Patient was evaluated and treated and all questions answered.  S/p foot surgery left third toe amputation partial -Progressing as expected post-operatively. -WB Status: WBAT in surgical shoe -Sutures: To remain intact for 2 weeks. -She is planning to get back to work on Monday and is able to sit for her job.  I think this is reasonable. -Foot redressed.  Return in about 2 weeks (around 08/04/2020) for suture removal.

## 2020-07-26 ENCOUNTER — Encounter: Payer: BC Managed Care – PPO | Admitting: Podiatry

## 2020-07-26 DIAGNOSIS — G2581 Restless legs syndrome: Secondary | ICD-10-CM | POA: Insufficient documentation

## 2020-07-26 NOTE — Assessment & Plan Note (Signed)
Established problem. Stable. Continue current medications and other regiments. Consider change to Coreg to ARB next ov.

## 2020-07-26 NOTE — Addendum Note (Signed)
Addended by: Acquanetta Belling D on: 07/26/2020 08:27 AM   Modules accepted: Orders, Level of Service

## 2020-07-26 NOTE — Assessment & Plan Note (Signed)
New problem Known microcytic anemia Recommend starting iron tablet daily Start trial of pramipexole 0.125 mg qhs RTC 1 month to assess tolerance and response.

## 2020-07-26 NOTE — Assessment & Plan Note (Signed)
Need to address colonoscopy referral again.

## 2020-07-26 NOTE — Assessment & Plan Note (Signed)
Again, needs referral to ophthalmology for eye exam, GI for colonoscopy Needs Pap smear Needs HCV screening

## 2020-07-26 NOTE — Assessment & Plan Note (Signed)
Established problem that has improved.  Continue tramadol 50 mg qhs

## 2020-07-26 NOTE — Progress Notes (Signed)
[name] is accompanied by wife Sources of clinical information for visit is/are patient, spouse/SO, past medical records and DC summary. Nursing assessment for this office visit was reviewed with the patient for accuracy and revision.     Previous Report(s) Reviewed: lab reports and operative report from podiatry  Depression screen [facility] 2/9 07/21/2020  Decreased Interest 1  Down, Depressed, Hopeless 2  PHQ - 2 Score 3  Altered sleeping 3  Tired, decreased energy 3  Change in appetite 1  Feeling bad or failure about yourself  1  Trouble concentrating 1  Moving slowly or fidgety/restless 0  Suicidal thoughts 0  PHQ-9 Score 12  Difficult doing work/chores Not difficult at all  Some recent data might be hidden    Fall Risk  07/21/2020 04/24/2016 04/27/2014  Falls in the past year? 0 Yes No  Number falls in past yr: - 1 -  Injury with Fall? - Yes -  Risk Factor Category  - High Fall Risk -  Risk for fall due to : - History of fall(s);Other (Comment) -  Risk for fall due to: Comment - Lt knee buckles, bilateral diabetic foot pain -  Follow up - Falls evaluation completed -    PHQ9 SCORE ONLY 07/21/2020 06/16/2020 03/10/2020  PHQ-9 Total Score 12 12 12     Adult vaccines due  Topic Date Due  . TETANUS/TDAP  12/19/2023  . PNEUMOCOCCAL POLYSACCHARIDE VACCINE AGE 44-64 HIGH RISK  Completed    Health Maintenance Due  Topic Date Due  . Hepatitis C Screening  Never done  . PAP SMEAR-Modifier  Never done  . OPHTHALMOLOGY EXAM  11/23/2014  . MAMMOGRAM  Never done  . COLONOSCOPY  Never done      History/P.E. limitations: none  Adult vaccines due  Topic Date Due  . TETANUS/TDAP  12/19/2023  . PNEUMOCOCCAL POLYSACCHARIDE VACCINE AGE 44-64 HIGH RISK  Completed    Diabetes Health Maintenance Due  Topic Date Due  . OPHTHALMOLOGY EXAM  11/23/2014  . HEMOGLOBIN A1C  12/17/2020  . FOOT EXAM  03/10/2021    Health Maintenance Due  Topic Date Due  . Hepatitis C Screening  Never  done  . PAP SMEAR-Modifier  Never done  . OPHTHALMOLOGY EXAM  11/23/2014  . MAMMOGRAM  Never done  . COLONOSCOPY  Never done     Chief Complaint  Patient presents with  . Diabetes    Diabetes Mellitus Type Two  Disease Monitoring  Blood Sugar Ranges: 100s  Polyuria: no   Visual problems: no   Recent Medication Changes:  yes, Dapagliflozin 5 mg daily started LOV 8/12.    Medication Regiment Adherence: yes, Metformin 1000 mg BID,  NPH 25 units BID                                                       no know adherence challenges Medication Side Effects  Hypoglycemia: no    Recent Physical stressor: yes, toe amputation for chronic osteomyelitis   Level of Activity: work; Sedentary  Preventitive Health Care                          Taking Prescribed Statin: yes             Taking Prescribed ACEI/ARB: no  Last Eye Exam: 2015            Restless Leg feeling - Onset last year - Creepy sensation in feet and legs - Releived with standing and walking - Interfering with sleep.  - Tramadol 50 mg qhs only helps with relieving neuropathy pain, not this sensation  .

## 2020-07-26 NOTE — Assessment & Plan Note (Signed)
Lab Results  Component Value Date   HGBA1C 6.5 06/16/2020  Established problem Controlled Continue current medications and other regiments

## 2020-07-28 ENCOUNTER — Encounter: Payer: BC Managed Care – PPO | Admitting: Podiatry

## 2020-08-04 ENCOUNTER — Other Ambulatory Visit: Payer: Self-pay

## 2020-08-04 ENCOUNTER — Ambulatory Visit (INDEPENDENT_AMBULATORY_CARE_PROVIDER_SITE_OTHER): Payer: BC Managed Care – PPO | Admitting: Podiatry

## 2020-08-04 DIAGNOSIS — E1142 Type 2 diabetes mellitus with diabetic polyneuropathy: Secondary | ICD-10-CM

## 2020-08-04 DIAGNOSIS — M86672 Other chronic osteomyelitis, left ankle and foot: Secondary | ICD-10-CM

## 2020-08-04 DIAGNOSIS — Z9889 Other specified postprocedural states: Secondary | ICD-10-CM

## 2020-08-04 NOTE — Telephone Encounter (Signed)
Patient walks into front office requesting refill on albuterol inhaler. Patient reports that she is going out of town tomorrow and needs refill sent in as soon as possible.   To PCP  Veronda Prude, RN

## 2020-08-04 NOTE — Progress Notes (Signed)
  Subjective:  Patient ID: [name], female    DOB: 01-31-1966,  MRN: [id number]  Chief Complaint  Patient presents with  . Routine Post Op    POV #2 DOS 07/15/2020 LT 3RD TOE PARTIAL AMPUTATION    DOS: 07/15/2020 Procedure: Partial amputation left third toe  54 y.o. female returns for post-op check.  Having minimal pain.  Review of Systems: Negative except as noted in the HPI. Denies N/V/F/Ch.   Objective:  There were no vitals filed for this visit. There is no height or weight on file to calculate BMI. Constitutional Well developed. Well nourished.  Vascular Foot warm and well perfused. Capillary refill normal to all digits.   Neurologic Normal speech. Oriented to person, place, and time.   Dermatologic Skin healing well without signs of infection. Skin edges well coapted without signs of infection.  Orthopedic: Tenderness to palpation noted about the surgical site.    Assessment:   1. Chronic osteomyelitis of toe of left foot (HCC)   2. Post-operative state   3. Diabetic polyneuropathy associated with type 2 diabetes mellitus (HCC)    Plan:  Patient was evaluated and treated and all questions answered.  S/p foot surgery left third toe amputation partial -Progressing as expected post-operatively. -WB Status: May transition to WBAT in regular shoes -Sutures: All sutures removed today, fresh Steri-Strips applied -She may begin bathing  No follow-ups on file.

## 2020-08-05 MED ORDER — ALBUTEROL SULFATE HFA 108 (90 BASE) MCG/ACT IN AERS: 1.0000 | INHALATION_SPRAY | RESPIRATORY_TRACT | 3 refills | Status: DC | PRN

## 2020-08-11 ENCOUNTER — Encounter: Payer: BC Managed Care – PPO | Admitting: Podiatry

## 2020-08-31 ENCOUNTER — Ambulatory Visit: Payer: BC Managed Care – PPO | Admitting: Gastroenterology

## 2020-09-01 ENCOUNTER — Other Ambulatory Visit: Payer: Self-pay | Admitting: Podiatry

## 2020-09-01 ENCOUNTER — Other Ambulatory Visit: Payer: Self-pay

## 2020-09-01 ENCOUNTER — Telehealth: Payer: Self-pay | Admitting: Family Medicine

## 2020-09-01 ENCOUNTER — Encounter: Payer: Self-pay | Admitting: Podiatry

## 2020-09-01 ENCOUNTER — Ambulatory Visit (INDEPENDENT_AMBULATORY_CARE_PROVIDER_SITE_OTHER): Payer: BC Managed Care – PPO | Admitting: Podiatry

## 2020-09-01 ENCOUNTER — Ambulatory Visit (INDEPENDENT_AMBULATORY_CARE_PROVIDER_SITE_OTHER): Payer: BC Managed Care – PPO

## 2020-09-01 DIAGNOSIS — S98122A Partial traumatic amputation of left great toe, initial encounter: Secondary | ICD-10-CM

## 2020-09-01 DIAGNOSIS — Z9889 Other specified postprocedural states: Secondary | ICD-10-CM

## 2020-09-01 DIAGNOSIS — E1142 Type 2 diabetes mellitus with diabetic polyneuropathy: Secondary | ICD-10-CM

## 2020-09-01 DIAGNOSIS — E1169 Type 2 diabetes mellitus with other specified complication: Secondary | ICD-10-CM | POA: Diagnosis not present

## 2020-09-01 DIAGNOSIS — B351 Tinea unguium: Secondary | ICD-10-CM

## 2020-09-01 DIAGNOSIS — M86672 Other chronic osteomyelitis, left ankle and foot: Secondary | ICD-10-CM

## 2020-09-01 MED ORDER — NONFORMULARY OR COMPOUNDED ITEM: 3 refills | Status: DC

## 2020-09-01 MED ORDER — INSULIN LISPRO 100 UNIT/ML ~~LOC~~ SOLN
25.0000 [IU] | Freq: Two times a day (BID) | SUBCUTANEOUS | 5 refills | Status: DC
Start: 2020-09-01 — End: 2021-01-16

## 2020-09-01 NOTE — Telephone Encounter (Signed)
Sent request to PCP. Staisha Winiarski, CMA  

## 2020-09-01 NOTE — Telephone Encounter (Signed)
Pt walked in to request refill of:  Name of Medication(s):  Regular insulin; Novolog; pin form  Last date of OV: 07/21/20 [facility] [address]  Will route refill request to Clinic RN.  Discussed with patient policy to call pharmacy for future refills.  Also, discussed refills may take up to 48 hours to approve or deny.  Vilinda Blanks

## 2020-09-01 NOTE — Progress Notes (Addendum)
  Subjective:  Patient ID: [name], female    DOB: [birth year],  MRN: [id number]  Chief Complaint  Patient presents with  . Routine Post Op    PT stated that she is doing well. denies any pain and has no concerens     DOS: 07/15/2020 Procedure: Partial amputation left third toe  54 y.o. female returns for post-op check.  Having minimal pain from amputation, her neuropathy is getting worse and painful and affecting her work ability  Review of Systems: Negative except as noted in the HPI. Denies N/V/F/Ch.   Objective:  There were no vitals filed for this visit. There is no height or weight on file to calculate BMI. Constitutional Well developed. Well nourished.  Vascular Foot warm and well perfused. Capillary refill normal to all digits.   Neurologic Normal speech. Oriented to person, place, and time.   Dermatologic Small scab over incision. Elongated dystrophic thickened nails x9  Orthopedic: No pain    Assessment:   1. Chronic osteomyelitis of toe of left foot (HCC)    Plan:  Patient was evaluated and treated and all questions answered.  S/p foot surgery left third toe amputation partial -Resume full activity -Regular bathing and moisturizer -Nails debrided in length and thickness with a sharp nail nipper to alleviate pressure -Rx for compounded neuropathic pain cream sent to [facility]  No follow-ups on file.

## 2020-09-08 ENCOUNTER — Other Ambulatory Visit: Payer: Self-pay

## 2020-09-08 ENCOUNTER — Encounter: Payer: Self-pay | Admitting: Family Medicine

## 2020-09-08 ENCOUNTER — Ambulatory Visit (INDEPENDENT_AMBULATORY_CARE_PROVIDER_SITE_OTHER): Payer: BC Managed Care – PPO | Admitting: Family Medicine

## 2020-09-08 VITALS — BP 118/70 | HR 76 | Ht 63.0 in | Wt 236.0 lb

## 2020-09-08 DIAGNOSIS — Z1231 Encounter for screening mammogram for malignant neoplasm of breast: Secondary | ICD-10-CM | POA: Diagnosis not present

## 2020-09-08 DIAGNOSIS — Z0000 Encounter for general adult medical examination without abnormal findings: Secondary | ICD-10-CM

## 2020-09-08 DIAGNOSIS — E1142 Type 2 diabetes mellitus with diabetic polyneuropathy: Secondary | ICD-10-CM

## 2020-09-08 DIAGNOSIS — M654 Radial styloid tenosynovitis [de Quervain]: Secondary | ICD-10-CM

## 2020-09-08 DIAGNOSIS — Z1211 Encounter for screening for malignant neoplasm of colon: Secondary | ICD-10-CM

## 2020-09-08 DIAGNOSIS — G2581 Restless legs syndrome: Secondary | ICD-10-CM | POA: Diagnosis not present

## 2020-09-08 DIAGNOSIS — E1159 Type 2 diabetes mellitus with other circulatory complications: Secondary | ICD-10-CM | POA: Diagnosis not present

## 2020-09-08 DIAGNOSIS — E1140 Type 2 diabetes mellitus with diabetic neuropathy, unspecified: Secondary | ICD-10-CM

## 2020-09-08 LAB — POCT GLYCOSYLATED HEMOGLOBIN (HGB A1C): Hemoglobin A1C: 6.7 % — AB (ref 4.0–5.6)

## 2020-09-08 MED ORDER — GABAPENTIN 600 MG PO TABS: 1200.0000 mg | ORAL_TABLET | Freq: Three times a day (TID) | ORAL | 0 refills | Status: DC

## 2020-09-08 MED ORDER — NONFORMULARY OR COMPOUNDED ITEM: 3 refills | Status: DC

## 2020-09-08 MED ORDER — "INSULIN SYRINGE 29G X 1/2"" 1 ML MISC": 12 refills | Status: DC

## 2020-09-08 NOTE — Patient Instructions (Addendum)
[facility] [phone]. [place].   Referral to Hand Surgeon, Dr Josephine Igo for your Lollie Sails Tenosynovitis of your right hand.   A referral to [facility] to help identify diabetes medications like Victoza that your insurance would cover.    Request for Cologuard test to be sent to your home.    We fax'd in the neuropathy cream from podiatry to [facility] this morning.

## 2020-09-09 ENCOUNTER — Encounter: Payer: Self-pay | Admitting: Family Medicine

## 2020-09-09 DIAGNOSIS — M654 Radial styloid tenosynovitis [de Quervain]: Secondary | ICD-10-CM | POA: Insufficient documentation

## 2020-09-09 HISTORY — DX: Radial styloid tenosynovitis (de quervain): M65.4

## 2020-09-09 NOTE — Assessment & Plan Note (Signed)
Established problem Uncontrolled Changed patient's gabapentin to 600 mg capsules, two capsules three times a day She is no longer taking tramadol - uncertain why she no longer taking. She was given RX for compound medication by Dr Ventura Sellers, DPM as trial for painful neuropathy.  Unofrtunately, the Rx did not go through to [place], so I printed the prescription and fax'd it to this pharmacy.   Non-formulary compound Peripheral Neuropathy Cream:  Bupivacaine 1%, Doxepin 3%, Gabapentin 6%, Pentoxifylline 3%, Topiramate 1%

## 2020-09-09 NOTE — Progress Notes (Signed)
[name] is alone Sources of clinical information for visit is/are patient and past medical records. Nursing assessment for this office visit was reviewed with the patient for accuracy and revision.     Previous Report(s) Reviewed: office notes and Podiatry notes   Depression screen PHQ 2/9 09/08/2020  Decreased Interest 1  Down, Depressed, Hopeless 1  PHQ - 2 Score 2  Altered sleeping 1  Tired, decreased energy 1  Change in appetite 0  Feeling bad or failure about yourself  1  Trouble concentrating 1  Moving slowly or fidgety/restless 0  Suicidal thoughts 0  PHQ-9 Score 6  Difficult doing work/chores Not difficult at all  Some recent data might be hidden    Fall Risk  07/21/2020 04/24/2016 04/27/2014  Falls in the past year? 0 Yes No  Number falls in past yr: - 1 -  Injury with Fall? - Yes -  Risk Factor Category  - High Fall Risk -  Risk for fall due to : - History of fall(s);Other (Comment) -  Risk for fall due to: Comment - Lt knee buckles, bilateral diabetic foot pain -  Follow up - Falls evaluation completed -    PHQ9 SCORE ONLY 09/08/2020 07/21/2020 06/16/2020  PHQ-9 Total Score 6 12 12     Adult vaccines due  Topic Date Due  . TETANUS/TDAP  12/19/2023  . PNEUMOCOCCAL POLYSACCHARIDE VACCINE AGE 70-64 HIGH RISK  Completed    Health Maintenance Due  Topic Date Due  . Hepatitis C Screening  Never done  . PAP SMEAR-Modifier  Never done  . OPHTHALMOLOGY EXAM  11/23/2014  . MAMMOGRAM  Never done  . COLONOSCOPY  Never done      History/P.E. limitations: none  Adult vaccines due  Topic Date Due  . TETANUS/TDAP  12/19/2023  . PNEUMOCOCCAL POLYSACCHARIDE VACCINE AGE 70-64 HIGH RISK  Completed    Diabetes Health Maintenance Due  Topic Date Due  . OPHTHALMOLOGY EXAM  11/23/2014  . HEMOGLOBIN A1C  03/08/2021  . FOOT EXAM  03/10/2021    Health Maintenance Due  Topic Date Due  . Hepatitis C Screening  Never done  . PAP SMEAR-Modifier  Never done  .  OPHTHALMOLOGY EXAM  11/23/2014  . MAMMOGRAM  Never done  . COLONOSCOPY  Never done     Chief Complaint  Patient presents with  . Diabetes

## 2020-09-09 NOTE — Assessment & Plan Note (Signed)
New problem. Positive [place]. 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 Has been wearing thumb spica brace without help. She requested referral to Hand surgeon Referral to Josephine Igo, MD placed

## 2020-09-09 NOTE — Assessment & Plan Note (Signed)
Established problem Uncontrolled She reports pramipexole at low dose did not help.  She was not interested in trying a higher dose.  She was not interested in trying other prescriptions.

## 2020-09-09 NOTE — Assessment & Plan Note (Addendum)
Pt agreed to Pap smear at next ov She agreed to [facility] CRC screening.  MM order sent in

## 2020-09-09 NOTE — Assessment & Plan Note (Addendum)
Established problem Well Controlled. A1c 6.7% No signs of complications, medication side effects, or red flags. Continue Farxiga 5mg  daily (max 10 mg daily), metformin 1000 mg BID, Humalog 25 units [place] BID Would like to restart patient's Victoza to start insulin dose sparing and weight reduction.  Patient was unable to afford Victoza in past.  Will ask [facility] pharmacy if they can help identify what GLP-1 her insurance will cover.

## 2020-09-12 ENCOUNTER — Ambulatory Visit (INDEPENDENT_AMBULATORY_CARE_PROVIDER_SITE_OTHER): Payer: BC Managed Care – PPO | Admitting: Podiatry

## 2020-09-12 ENCOUNTER — Encounter: Payer: Self-pay | Admitting: Podiatry

## 2020-09-12 ENCOUNTER — Other Ambulatory Visit: Payer: Self-pay

## 2020-09-12 DIAGNOSIS — E1142 Type 2 diabetes mellitus with diabetic polyneuropathy: Secondary | ICD-10-CM

## 2020-09-12 DIAGNOSIS — M2042 Other hammer toe(s) (acquired), left foot: Secondary | ICD-10-CM

## 2020-09-12 DIAGNOSIS — L97521 Non-pressure chronic ulcer of other part of left foot limited to breakdown of skin: Secondary | ICD-10-CM | POA: Diagnosis not present

## 2020-09-12 NOTE — Progress Notes (Signed)
°  Subjective:  Patient ID: [name], female    DOB: [birth year],  MRN: [id number]  Chief Complaint  Patient presents with   Foot Pain    Patient presents today for new sore left 4th toe x 3 days   Foot Ulcer    53 y.o. female presents with the above complaint. History confirmed with patient.  Presents for urgent visit to evaluate new wound on lateral fourth toe  Objective:  Physical Exam: warm, good capillary refill and normal DP and PT pulses.  Loss of protective sensation bilaterally with gross neuropathy.  She has a new ulceration with breakdown of skin on the lateral fourth toe which appears to be consistent with a heloma molle that has deepened.  Mild serous drainage.  No signs of infection.  No exposed bone tendon or capsule.  Assessment:  No diagnosis found.   Plan:  Patient was evaluated and treated and all questions answered.  Ulcer left fourth toe -Debridement as below. -Dressed with Iodosorb, DSD. -Recommend she use an iodine-based ointment such as Iodosorb or Betadine to manage moisture. We discussed that she likely would benefit from arthroplasty of the fourth and fifth toes to straighten the toes out and prevent heloma molle recurrence which could turn into an ulcer for her due to her neuropathy.  Procedure: Excisional Debridement of Wound Rationale: Removal of non-viable soft tissue from the wound to promote healing.  Anesthesia: none Pre-Debridement Wound Measurements: 0.3 cm x 0.2 cm x 0.1 cm  Post-Debridement Wound Measurements: Same as predebridement Type of Debridement: Sharp selective Tissue Removed: Non-viable soft tissue Depth of Debridement: subcutaneous tissue. Technique: Sharp selective debridement to bleeding, viable wound base.  Dressing: Dry, sterile, compression dressing. Disposition: Patient tolerated procedure well. Patient to return in 1 week for follow-up.  Return in about 2 weeks (around 09/26/2020).       Return in about 2 weeks  (around 09/26/2020).

## 2020-09-16 ENCOUNTER — Other Ambulatory Visit: Payer: Self-pay

## 2020-09-16 ENCOUNTER — Ambulatory Visit (INDEPENDENT_AMBULATORY_CARE_PROVIDER_SITE_OTHER): Payer: BC Managed Care – PPO | Admitting: Podiatry

## 2020-09-16 DIAGNOSIS — L03032 Cellulitis of left toe: Secondary | ICD-10-CM | POA: Diagnosis not present

## 2020-09-16 DIAGNOSIS — L97521 Non-pressure chronic ulcer of other part of left foot limited to breakdown of skin: Secondary | ICD-10-CM

## 2020-09-16 DIAGNOSIS — M2042 Other hammer toe(s) (acquired), left foot: Secondary | ICD-10-CM | POA: Diagnosis not present

## 2020-09-16 DIAGNOSIS — L02612 Cutaneous abscess of left foot: Secondary | ICD-10-CM

## 2020-09-16 DIAGNOSIS — E1142 Type 2 diabetes mellitus with diabetic polyneuropathy: Secondary | ICD-10-CM

## 2020-09-16 MED ORDER — SULFAMETHOXAZOLE-TRIMETHOPRIM 800-160 MG PO TABS: 1.0000 | ORAL_TABLET | Freq: Two times a day (BID) | ORAL | 1 refills | Status: DC

## 2020-09-19 ENCOUNTER — Encounter: Payer: Self-pay | Admitting: Podiatry

## 2020-09-19 NOTE — Progress Notes (Signed)
  Subjective:  Patient ID: [name], female    DOB: 08/01/1966,  MRN: [id number]  Chief Complaint  Patient presents with  . Foot Ulcer    Toe ulcer check. Pt concerned of infection. Denies fever/nausea/vomiting/chills.     54 y.o. female returns in urgent basis with the above complaint. History confirmed with patient.  Feels like the toes gotten swollen and red  Objective:  Physical Exam: warm, good capillary refill and normal DP and PT pulses.  Loss of protective sensation bilaterally with gross neuropathy.  Mild cellulitis of fourth toe at the level of the MPJ.  Ulcer has similar appearance to previous exam with mild serous drainage  Assessment:  No diagnosis found.   Plan:  Patient was evaluated and treated and all questions answered.  Ulcer left fourth toe -She had mild cellulitis today.  I recommend we treat with Bactrim DS twice daily for 10 days -Continue dressing daily with an iodine-based ointment     Return in about 11 days (around 09/27/2020).

## 2020-09-27 ENCOUNTER — Other Ambulatory Visit: Payer: Self-pay

## 2020-09-27 ENCOUNTER — Ambulatory Visit (INDEPENDENT_AMBULATORY_CARE_PROVIDER_SITE_OTHER): Payer: BC Managed Care – PPO | Admitting: Podiatry

## 2020-09-27 DIAGNOSIS — E1142 Type 2 diabetes mellitus with diabetic polyneuropathy: Secondary | ICD-10-CM

## 2020-09-27 DIAGNOSIS — M2042 Other hammer toe(s) (acquired), left foot: Secondary | ICD-10-CM | POA: Diagnosis not present

## 2020-09-27 DIAGNOSIS — L97521 Non-pressure chronic ulcer of other part of left foot limited to breakdown of skin: Secondary | ICD-10-CM

## 2020-09-28 ENCOUNTER — Encounter: Payer: Self-pay | Admitting: Podiatry

## 2020-09-28 NOTE — Progress Notes (Signed)
  Subjective:  Patient ID: [name], female    DOB: 23-Jun-1966,  MRN: [id number]  Chief Complaint  Patient presents with  . Follow-up    pt states her toe is improving.    54 y.o. female returns with the above complaint. History confirmed with patient.  Feels like she is doing well.  Still taking the Bactrim  Objective:  Physical Exam: warm, good capillary refill and normal DP and PT pulses.  Loss of protective sensation bilaterally with gross neuropathy.  Cellulitis has resolved.  The calluses still present.  No signs of infection today. Assessment:  No diagnosis found.   Plan:  Patient was evaluated and treated and all questions answered.  Ulcer left fourth toe -Ulcer has healed.  I debrided the overlying callus today and there is no underlying skin compromise -Recommend we offload these with silicone toe pads.  If these are not successful we should consider surgical correction of the hammertoe deformities to alleviate this and prevent this from coming back in the future     Return in about 3 weeks (around 10/18/2020) for wound re-check.

## 2020-10-02 DIAGNOSIS — Z1211 Encounter for screening for malignant neoplasm of colon: Secondary | ICD-10-CM | POA: Diagnosis not present

## 2020-10-02 DIAGNOSIS — Z1212 Encounter for screening for malignant neoplasm of rectum: Secondary | ICD-10-CM | POA: Diagnosis not present

## 2020-10-05 DIAGNOSIS — M654 Radial styloid tenosynovitis [de Quervain]: Secondary | ICD-10-CM | POA: Diagnosis not present

## 2020-10-05 DIAGNOSIS — M79641 Pain in right hand: Secondary | ICD-10-CM | POA: Diagnosis not present

## 2020-10-10 ENCOUNTER — Encounter: Payer: Self-pay | Admitting: Family Medicine

## 2020-10-11 LAB — COLOGUARD: Cologuard: NEGATIVE

## 2020-10-12 LAB — EXTERNAL GENERIC LAB PROCEDURE: COLOGUARD: NEGATIVE

## 2020-10-12 LAB — COLOGUARD: COLOGUARD: NEGATIVE

## 2020-10-15 ENCOUNTER — Other Ambulatory Visit: Payer: Self-pay | Admitting: Family Medicine

## 2020-10-15 DIAGNOSIS — I2510 Atherosclerotic heart disease of native coronary artery without angina pectoris: Secondary | ICD-10-CM

## 2020-10-15 DIAGNOSIS — I10 Essential (primary) hypertension: Secondary | ICD-10-CM

## 2020-10-15 DIAGNOSIS — I214 Non-ST elevation (NSTEMI) myocardial infarction: Secondary | ICD-10-CM

## 2020-10-20 ENCOUNTER — Other Ambulatory Visit: Payer: Self-pay

## 2020-10-20 ENCOUNTER — Ambulatory Visit (INDEPENDENT_AMBULATORY_CARE_PROVIDER_SITE_OTHER): Payer: BC Managed Care – PPO | Admitting: Podiatry

## 2020-10-20 DIAGNOSIS — E1142 Type 2 diabetes mellitus with diabetic polyneuropathy: Secondary | ICD-10-CM

## 2020-10-20 DIAGNOSIS — M2042 Other hammer toe(s) (acquired), left foot: Secondary | ICD-10-CM

## 2020-10-20 DIAGNOSIS — L97521 Non-pressure chronic ulcer of other part of left foot limited to breakdown of skin: Secondary | ICD-10-CM | POA: Diagnosis not present

## 2020-10-22 NOTE — Progress Notes (Signed)
  Subjective:  Patient ID: [name], female    DOB: [birth year],  MRN: [id number]  Chief Complaint  Patient presents with  . Foot Ulcer    3 weeks (around 10/18/2020) for wound re-check.    54 y.o. female returns with the above complaint. History confirmed with patient.  Feels like she is doing well.  No longer taking antibiotic.  She is putting ointment on it daily.  Objective:  Physical Exam: warm, good capillary refill and normal DP and PT pulses.  Loss of protective sensation bilaterally with gross neuropathy.  Small heloma molle on the lateral fourth toe with 3 mm diameter ulceration with hyperkeratosis and mild fibrotic tissue in the center.  No signs of infection.  The lesion on the adjacent fifth toe has healed on the medial side Assessment:   1. Skin ulcer of toe of left foot, limited to breakdown of skin (HCC)   2. Hammertoe of left foot   3. Diabetic polyneuropathy associated with type 2 diabetes mellitus (HCC)      Plan:  Patient was evaluated and treated and all questions answered.  Ulcer left fourth toe -Ulcer has healed.  I debrided the overlying callus and devitalized tissue today, there is still a small superficial ulceration. -Seems to be improving.  The fifth toe ulcer has healed.  Continue offloading.  We will discuss surgical intervention at the next visit again if it is still persistent.     Return in about 1 month (around 11/20/2020).

## 2020-12-08 ENCOUNTER — Encounter: Payer: Self-pay | Admitting: Family Medicine

## 2020-12-08 ENCOUNTER — Other Ambulatory Visit: Payer: Self-pay

## 2020-12-08 ENCOUNTER — Ambulatory Visit (INDEPENDENT_AMBULATORY_CARE_PROVIDER_SITE_OTHER): Payer: BC Managed Care – PPO | Admitting: Family Medicine

## 2020-12-08 ENCOUNTER — Ambulatory Visit: Payer: BC Managed Care – PPO | Admitting: Family Medicine

## 2020-12-08 VITALS — BP 130/78 | HR 68 | Ht 63.0 in | Wt 247.8 lb

## 2020-12-08 DIAGNOSIS — E1159 Type 2 diabetes mellitus with other circulatory complications: Secondary | ICD-10-CM

## 2020-12-08 DIAGNOSIS — F341 Dysthymic disorder: Secondary | ICD-10-CM | POA: Diagnosis not present

## 2020-12-08 DIAGNOSIS — G4733 Obstructive sleep apnea (adult) (pediatric): Secondary | ICD-10-CM

## 2020-12-08 DIAGNOSIS — E1140 Type 2 diabetes mellitus with diabetic neuropathy, unspecified: Secondary | ICD-10-CM | POA: Diagnosis not present

## 2020-12-08 DIAGNOSIS — Z9989 Dependence on other enabling machines and devices: Secondary | ICD-10-CM

## 2020-12-08 LAB — POCT GLYCOSYLATED HEMOGLOBIN (HGB A1C): Hemoglobin A1C: 7.3 % — AB (ref 4.0–5.6)

## 2020-12-08 MED ORDER — SEMAGLUTIDE (1 MG/DOSE) 4 MG/3ML ~~LOC~~ SOPN
1.0000 mg | PEN_INJECTOR | SUBCUTANEOUS | 5 refills | Status: DC
Start: 2020-12-08 — End: 2020-12-08

## 2020-12-08 MED ORDER — TRULICITY 0.75 MG/0.5ML ~~LOC~~ SOAJ: 0.7500 mg | SUBCUTANEOUS | 0 refills | Status: DC

## 2020-12-08 NOTE — Progress Notes (Signed)
Asked by Dr. McDiarmid to educate patient on use and administration of Trulicity therapy. Patient educated on purpose, proper use and potential adverse effects of nausea, vomiting.  Following instruction patient verbalized understanding of treatment plan and demonstrated appropriate technique with first injection.    Patient seen with:  Calton Dach, PharmD - PGY-1 Resident.

## 2020-12-08 NOTE — Patient Instructions (Addendum)
Inject 0.75 mg once a week of Trulicity. If you tolerate the Trulicity, then pick up Ozempic (semaglutide) and inject 1 mg once a week until you see Dr Sueanne Maniaci again in about 6 weeks    Here is a website with contact information for Bariatric Surgery at [facility]  [email or web address].  Telephone number [phone]  If you are interesting in learning how to eat healthier, call the [facility] Nutritionist, Dr Wyona Almas, to arrange a possible visit with her.  Her office phone number is [phone].  She sees patients at the [facility].

## 2020-12-08 NOTE — Progress Notes (Addendum)
[name] is alone Sources of clinical information for visit is/are patient and past medical records. Nursing assessment for this office visit was reviewed with the patient for accuracy and revision.     Previous Report(s) Reviewed: historical medical records, lab reports and podiatry consultation notes  Depression screen [facility] 2/9 12/08/2020  Decreased Interest 2  Down, Depressed, Hopeless 2  PHQ - 2 Score 4  Altered sleeping 2  Tired, decreased energy 2  Change in appetite 0  Feeling bad or failure about yourself  2  Trouble concentrating 1  Moving slowly or fidgety/restless 1  Suicidal thoughts 2  PHQ-9 Score 14  Difficult doing work/chores -  Some recent data might be hidden    Fall Risk  07/21/2020 04/24/2016 04/27/2014  Falls in the past year? 0 Yes No  Number falls in past yr: - 1 -  Injury with Fall? - Yes -  Risk Factor Category  - High Fall Risk -  Risk for fall due to : - History of fall(s);Other (Comment) -  Risk for fall due to: Comment - Lt knee buckles, bilateral diabetic foot pain -  Follow up - Falls evaluation completed -    PHQ9 SCORE ONLY 12/08/2020 09/08/2020 07/21/2020  PHQ-9 Total Score 14 6 12     Adult vaccines due  Topic Date Due  . TETANUS/TDAP  12/19/2023  . PNEUMOCOCCAL POLYSACCHARIDE VACCINE AGE 54-64 HIGH RISK  Completed    Health Maintenance Due  Topic Date Due  . Hepatitis C Screening  Never done  . PAP SMEAR-Modifier  Never done  . COLONOSCOPY (Pts 45-88yrs Insurance coverage will need to be confirmed)  Never done  . OPHTHALMOLOGY EXAM  11/23/2014  . MAMMOGRAM  Never done      History/P.E. limitations: none  Adult vaccines due  Topic Date Due  . TETANUS/TDAP  12/19/2023  . PNEUMOCOCCAL POLYSACCHARIDE VACCINE AGE 54-64 HIGH RISK  Completed    Diabetes Health Maintenance Due  Topic Date Due  . OPHTHALMOLOGY EXAM  11/23/2014  . FOOT EXAM  03/10/2021  . HEMOGLOBIN A1C  06/07/2021    Health Maintenance Due  Topic Date Due  .  Hepatitis C Screening  Never done  . PAP SMEAR-Modifier  Never done  . COLONOSCOPY (Pts 45-10yrs Insurance coverage will need to be confirmed)  Never done  . OPHTHALMOLOGY EXAM  11/23/2014  . MAMMOGRAM  Never done     Chief Complaint  Patient presents with  . Follow-up  . Weight Check    Questions about appetite and how to lose weight   . Depression

## 2020-12-12 NOTE — Assessment & Plan Note (Signed)
Addition of GlP-1 inhibitor, dulaglutide 0.75 mg [place] today and in a week.   If tolerating the trulicity samples, patient is to start semaglutide 1 mg weekly. RTC 4 weeks to access tolerance, cbgs and weight Continue Farxiga - can increase dose.   Information to contact [facility] Bariatric Surgery program. Contact number for Dr Rhona Raider for changes in lifestyle.   Can use [facility] in future

## 2020-12-12 NOTE — Assessment & Plan Note (Addendum)
Working explanation is dysthymia or adjustment disorder. [name] is very concerned about her increasing weight.  She feels unsupported by her enviroment in changing her diet. 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  I do not sense that there is major indigenous depression.   I suspect that talking with a nutritionist who can help with the patient's dietary changes, but also help her work thru the [employer] of these changes.  Patient given Dr Rhona Raider' card for her consideration.   If antidepressant medications needed, will try avoid those associated with weight gain.  Venlafalaxine ER and/or Bupropion have a low weight gain profile.   Patient's OSA needs to be addressed as cumulative sleep deprivation can present with depressed mood.  Follow up barriers to her attending [facility] - sleep med for eval and tx

## 2020-12-12 NOTE — Assessment & Plan Note (Addendum)
Lab Results  Component Value Date   HGBA1C 7.3 (A) 12/08/2020   .Established problem not at goal, A1c < 7.0. Addition of GlP-1 inhibitor, dulaglutide 0.75 mg [place] today and in a week.   If tolerating the trulicity samples, patient is to start semaglutide 1 mg weekly. RTC 4 weeks to access tolerance, cbgs and weight  Can increase Farxiga at future ov, if needed.  Continue Humalog 25 units BID - Pt instructed to watch for hypoglycemia with introduction of GLP-1i and decrease insulin dose.

## 2020-12-12 NOTE — Assessment & Plan Note (Addendum)
I do not see a record of [name] going for her OSA assessment at [facility]. Will need to assess barriers to this consultation at next visit.

## 2020-12-12 NOTE — Assessment & Plan Note (Signed)
Established problem Uncontrolled [name] has not obtained the compound topical prescribed by podiatry - it may have been the location of [facility] that kept her from picking it up. Continue Gabapentin  Restart of Duloxetine, previous Rx'd by Dr Randolm Idol in 2017, may be useful, though Venlafaxine has the better weight profile. Consider next ov.

## 2020-12-21 ENCOUNTER — Telehealth: Payer: Self-pay

## 2020-12-21 DIAGNOSIS — E1159 Type 2 diabetes mellitus with other circulatory complications: Secondary | ICD-10-CM

## 2020-12-21 DIAGNOSIS — E1140 Type 2 diabetes mellitus with diabetic neuropathy, unspecified: Secondary | ICD-10-CM

## 2020-12-21 NOTE — Telephone Encounter (Signed)
Patient calls nurse line reporting she is due for next Trulicity injection on Thursday, however she is out of samples. Per PCP note he was going to transition to Ozempic if she tolerated Trulicity well. Patient reports good results with Trulicity and is open to switching to Ozempic. Will forward to PCP.

## 2020-12-22 MED ORDER — OZEMPIC (1 MG/DOSE) 4 MG/3ML ~~LOC~~ SOPN: 1.0000 mg | PEN_INJECTOR | SUBCUTANEOUS | 5 refills | Status: DC

## 2020-12-22 NOTE — Telephone Encounter (Signed)
Please let [facility] know that her prescription for Ozempic, 1 mg subcut once a week has been sent to her pharmacy.

## 2020-12-22 NOTE — Telephone Encounter (Signed)
Attempted to reach pt. No answer. LVM for pt that her medication has been sent to her pharmacy. Aquilla Solian, CMA

## 2020-12-25 ENCOUNTER — Other Ambulatory Visit: Payer: Self-pay | Admitting: Family Medicine

## 2020-12-25 DIAGNOSIS — G2581 Restless legs syndrome: Secondary | ICD-10-CM

## 2020-12-25 DIAGNOSIS — E1142 Type 2 diabetes mellitus with diabetic polyneuropathy: Secondary | ICD-10-CM

## 2021-01-03 ENCOUNTER — Other Ambulatory Visit: Payer: Self-pay

## 2021-01-03 ENCOUNTER — Ambulatory Visit (INDEPENDENT_AMBULATORY_CARE_PROVIDER_SITE_OTHER): Payer: BC Managed Care – PPO | Admitting: Podiatry

## 2021-01-03 DIAGNOSIS — E1159 Type 2 diabetes mellitus with other circulatory complications: Secondary | ICD-10-CM

## 2021-01-03 DIAGNOSIS — E1142 Type 2 diabetes mellitus with diabetic polyneuropathy: Secondary | ICD-10-CM

## 2021-01-03 DIAGNOSIS — M79675 Pain in left toe(s): Secondary | ICD-10-CM | POA: Diagnosis not present

## 2021-01-03 DIAGNOSIS — M79674 Pain in right toe(s): Secondary | ICD-10-CM | POA: Diagnosis not present

## 2021-01-03 DIAGNOSIS — L84 Corns and callosities: Secondary | ICD-10-CM

## 2021-01-03 DIAGNOSIS — M2042 Other hammer toe(s) (acquired), left foot: Secondary | ICD-10-CM | POA: Diagnosis not present

## 2021-01-03 DIAGNOSIS — B351 Tinea unguium: Secondary | ICD-10-CM

## 2021-01-03 DIAGNOSIS — L97521 Non-pressure chronic ulcer of other part of left foot limited to breakdown of skin: Secondary | ICD-10-CM

## 2021-01-04 ENCOUNTER — Encounter: Payer: Self-pay | Admitting: Podiatry

## 2021-01-04 NOTE — Progress Notes (Signed)
  Subjective:  Patient ID: [name], female    DOB: [birth year],  MRN: [id number]  Chief Complaint  Patient presents with  . Nail Problem    55 y.o. female returns with the above complaint. History confirmed with patient.  Feels like she is doing well.  The callus and wound remains healed.  She has been using the silicone toe cap with success.  Her nails become elongated and thickened and painful  Objective:  Physical Exam: warm, good capillary refill and normal DP and PT pulses.  Loss of protective sensation bilaterally with gross neuropathy.  Heloma molle significantly reduced in size, no recurrence of ulceration.  There is thickening of the nail plates x9 with yellow discoloration, dystrophy and incurvation. Assessment:   1. Skin ulcer of toe of left foot, limited to breakdown of skin (HCC)   2. Hammertoe of left foot   3. Callus of foot   4. Pain due to onychomycosis of toenails of both feet   5. Diabetic polyneuropathy associated with type 2 diabetes mellitus (HCC)   6. Type 2 diabetes mellitus with other circulatory complications [facility])      Plan:  Patient was evaluated and treated and all questions answered.  Ulcer left fourth toe -Remains healed.  Continue offloading with silicone toe caps.  I dispensed more these today.  If it recurs or is a persistent issue for her and she has time we could consider arthroplasty of the digit.  Patient educated on diabetes. Discussed proper diabetic foot care and discussed risks and complications of disease. Educated patient in depth on reasons to return to the office immediately should he/she discover anything concerning or new on the feet. All questions answered. Discussed proper shoes as well.   Discussed the etiology and treatment options for the condition in detail with the patient. Educated patient on the topical and oral treatment options for mycotic nails. Recommended debridement of the nails today. Sharp and mechanical debridement  performed of all painful and mycotic nails today. Nails debrided in length and thickness using a nail nipper to level of comfort. Discussed treatment options including appropriate shoe gear. Follow up as needed for painful nails.      Return in about 3 months (around 04/05/2021) for at risk diabetic foot care.

## 2021-01-12 ENCOUNTER — Other Ambulatory Visit: Payer: Self-pay

## 2021-01-12 ENCOUNTER — Encounter: Payer: Self-pay | Admitting: Family Medicine

## 2021-01-12 ENCOUNTER — Ambulatory Visit (INDEPENDENT_AMBULATORY_CARE_PROVIDER_SITE_OTHER): Payer: BC Managed Care – PPO | Admitting: Family Medicine

## 2021-01-12 VITALS — BP 112/60 | HR 64 | Ht 63.0 in | Wt 226.4 lb

## 2021-01-12 DIAGNOSIS — E1159 Type 2 diabetes mellitus with other circulatory complications: Secondary | ICD-10-CM | POA: Diagnosis not present

## 2021-01-12 DIAGNOSIS — Z9989 Dependence on other enabling machines and devices: Secondary | ICD-10-CM

## 2021-01-12 DIAGNOSIS — G4733 Obstructive sleep apnea (adult) (pediatric): Secondary | ICD-10-CM

## 2021-01-12 DIAGNOSIS — R0683 Snoring: Secondary | ICD-10-CM

## 2021-01-12 DIAGNOSIS — Z8669 Personal history of other diseases of the nervous system and sense organs: Secondary | ICD-10-CM

## 2021-01-12 DIAGNOSIS — F341 Dysthymic disorder: Secondary | ICD-10-CM

## 2021-01-12 DIAGNOSIS — I10 Essential (primary) hypertension: Secondary | ICD-10-CM

## 2021-01-12 NOTE — Patient Instructions (Addendum)
Congratulations, you have lost 21 pounds in about 46 weeks. Stop Carvedilol.  Your blood pressure is very good.  We do not want to drop it too low.    Continue the Ozempic 1 mg injection once a week.  Lateral Femoral Cutaneous Nerve Block Patient Information  Description: The lateral femoral cutaneous nerve of the thigh is a purely sensory nerve that can become entrapped or irritated for a number of reasons.  The pain associated with this condition is called meralgia paraesthetica.  Patients affected with this syndrome have burning pain or abnormal sensation along the lateral aspect of the thigh.  The pain can be worsened by prolonged walking, standing, or constrictive garments around the house.   The diagnosis can be confirmed and treatment initiated by blocking the nerve with local anesthetic (like Novocaine).  At times, a steroid solution may be injected at the same time.  The site of injection is through a tiny needle in the left, lower quadrant of the abdomen.   The entire block usually lasts less than 5 minutes.  Conditions which may be treated by lateral femoral cutaneous nerve block:   Meralagia paraesthetica  Preparation for the injection:  1. Do not eat any solid food or dairy products within 8 hours of your appointment.  2. You may drink clear liquids up to 3 hours before appointment.  Clear liquids include water, black coffee, juice or soda. No milk or cream please. 3. You may take your regular medication, including pain medications, with a sip of water before your appointment.  Diabetics should hold regular insulin (if taken separately) and take 1/2 normal NPH dose the morning of the procedure.  Carry some sugar containing items with you to your appointment. 4. A driver must accompany you and be prepared to drive you home after your procedure. 5. Bring all you current medications with you 6. An IV may be inserted and sedation may be given at the discretion of the physician. 7. A  blood pressure cuff, EKG and other monitors will often be applied during the procedure.  Some patients may need to have extra oxygen administered for a short period. 8. You will be asked to provide medical information, including your allergies and medications, prior to the procedure.  We must know immediately if you are taking blood thinners (like Coumadin/Warfarin) or if you allergic to IV iodine contrast (dye)  We must know if you could possible be pregnant.   Possible side-effects:   Bleeding from needle site  Infection (rate, may require surgery)  Nerve injury (rare)  Numbness and Tingling (temporary)  Light-headedness (temporary)  Pain at injection site (several day)  Decreased blood pressure (rare, temporary)  Weakness in leg (temporary)  Call if you experience:  Hives or difficulty breathing (go to the emergency room)  Inflammation or drainage at the injection site(s)  Please note:  Although the local anesthetic injected can often make your leg feel good for several hours after the injection,  The pain may return.  It takes 3-7 days for steroids to work.  You may not notice any pain relief for at least one week.  If effective, we will often do a series of injections spaced 3-6 weeks apart to maximally decrease your pain.  If you have any questions, please cll [phone] [facility]

## 2021-01-13 ENCOUNTER — Encounter: Payer: Self-pay | Admitting: Family Medicine

## 2021-01-16 ENCOUNTER — Encounter: Payer: Self-pay | Admitting: Family Medicine

## 2021-01-16 NOTE — Assessment & Plan Note (Addendum)
Depression screen [facility] 2/9 01/12/2021 12/08/2020 09/08/2020  Decreased Interest 2 2 1   Down, Depressed, Hopeless 2 2 1   PHQ - 2 Score 4 4 2   Altered sleeping 2 2 1   Tired, decreased energy 3 2 1   Change in appetite 2 0 0  Feeling bad or failure about yourself  2 2 1   Trouble concentrating 2 1 1   Moving slowly or fidgety/restless 1 1 0  Suicidal thoughts 1 2 0  PHQ-9 Score 17 14 6   Difficult doing work/chores - - Not difficult at all  Some recent data might be hidden   No thougts of SI.  No plans for suicide.  Less degree of mortal thoughts than back in February. She is pleased by the affect her diet and medications has had on her weight loss goal.    Did not contact counselor as suggested. Appeared happy during the interview.  Re-enfored helpfulness of counseling  Not interested in antidepressant therapy at this time.  She has list of counselors. Recommended she contact her insurance first to see which counselors they will cover.

## 2021-01-16 NOTE — Addendum Note (Signed)
Addended byPerley Jain, Juriel Cid D on: 01/16/2021 11:07 AM   Modules accepted: Orders

## 2021-01-16 NOTE — Progress Notes (Signed)
[name] was alone for visit Sources of clinical information for visit is/are patient, past medical records and Podiatry notes, Pharmacy Clinic notes. Nursing assessment for this office visit was reviewed with the patient for accuracy and revision.     Previous Report(s) Reviewed: lab reports and office notes  Depression screen [facility] 2/9 01/12/2021  Decreased Interest 2  Down, Depressed, Hopeless 2  PHQ - 2 Score 4  Altered sleeping 2  Tired, decreased energy 3  Change in appetite 2  Feeling bad or failure about yourself  2  Trouble concentrating 2  Moving slowly or fidgety/restless 1  Suicidal thoughts 1  PHQ-9 Score 17  Difficult doing work/chores -  Some recent data might be hidden    Fall Risk  07/21/2020 04/24/2016 04/27/2014  Falls in the past year? 0 Yes No  Number falls in past yr: - 1 -  Injury with Fall? - Yes -  Risk Factor Category  - High Fall Risk -  Risk for fall due to : - History of fall(s);Other (Comment) -  Risk for fall due to: Comment - Lt knee buckles, bilateral diabetic foot pain -  Follow up - Falls evaluation completed -    PHQ9 SCORE ONLY 01/12/2021 12/08/2020 09/08/2020  PHQ-9 Total Score 17 14 6     Adult vaccines due  Topic Date Due  . TETANUS/TDAP  12/19/2023  . PNEUMOCOCCAL POLYSACCHARIDE VACCINE AGE 35-64 HIGH RISK  Completed    Health Maintenance Due  Topic Date Due  . Hepatitis C Screening  Never done  . PAP SMEAR-Modifier  Never done  . COLONOSCOPY (Pts 45-88yrs Insurance coverage will need to be confirmed)  Never done  . OPHTHALMOLOGY EXAM  11/23/2014  . MAMMOGRAM  Never done      History/P.E. limitations: none  Adult vaccines due  Topic Date Due  . TETANUS/TDAP  12/19/2023  . PNEUMOCOCCAL POLYSACCHARIDE VACCINE AGE 35-64 HIGH RISK  Completed    Diabetes Health Maintenance Due  Topic Date Due  . OPHTHALMOLOGY EXAM  11/23/2014  . FOOT EXAM  03/10/2021  . HEMOGLOBIN A1C  06/07/2021    Health Maintenance Due  Topic Date Due   . Hepatitis C Screening  Never done  . PAP SMEAR-Modifier  Never done  . COLONOSCOPY (Pts 45-31yrs Insurance coverage will need to be confirmed)  Never done  . OPHTHALMOLOGY EXAM  11/23/2014  . MAMMOGRAM  Never done     Chief Complaint  Patient presents with  . Follow-up

## 2021-01-16 NOTE — Assessment & Plan Note (Addendum)
Established problem that has improved.  Wt Readings from Last 3 Encounters:  01/12/21 226 lb 6 oz (102.7 kg)  12/08/20 247 lb 12.8 oz (112.4 kg)  09/08/20 236 lb (107 kg)  21 lb loss since early February.   Taking Semaglutide (Ozempic) 1 mg [place] q wk Pt reports a significant change in her diet with incorporation of more vegetables and lean proteins, elimination of non-diet sodas / juices / processed snacks. Pt did not check into Baraitric Surgery lecture bc she wants to see how well she can do with the current therapies.   Continue current Diet and medication for diabetes RTC 6 weeks.

## 2021-01-16 NOTE — Assessment & Plan Note (Signed)
Established problem. Relatively Stable. Patient has not taken dapagliflozin since first prescription ran out.  She said she did not know to request a refill.  She is no longer taking any insulin. She has been on the GLP-1s since 12/08/20.  Currrently on Semaglutide 1 mg  qwk.  Reports most of her fCBGs less than 150  Lab Results  Component Value Date   HGBA1C 7.3 (A) 12/08/2020   HGBA1C 6.7 (A) 09/08/2020   HGBA1C 6.5 06/16/2020   Lab Results  Component Value Date   LDLCALC 58 01/15/2019   CREATININE 0.79 07/15/2020    No signs of complications, medication side effects, or red flags. Continue current medications and other regiments. Recheck A1c next visit in 6 weeks.

## 2021-01-16 NOTE — Assessment & Plan Note (Addendum)
Referred [facility] to [facility] specialist.  Referral purpose is evaluation and treat for possible sleep apnea in patient with history of sleep apnea treated with CPAP.  Several years since patient used CPAP.

## 2021-01-16 NOTE — Assessment & Plan Note (Signed)
Today's Vitals   01/12/21 1517  BP: 112/60  Pulse: 64  SpO2: 95%  Weight: 226 lb 6 oz (102.7 kg)  Height: 5\' 3"  (1.6 m)  PainSc: 8   PainLoc: Leg   Body mass index is 40.1 kg/m. Basic Metabolic Panel:    Component Value Date/Time   NA 142 07/15/2020 1056   NA 141 03/10/2020 1210   K 4.4 07/15/2020 1056   CL 103 07/15/2020 1056   CO2 26 07/15/2020 1056   BUN 9 07/15/2020 1056   BUN 17 03/10/2020 1210   CREATININE 0.79 07/15/2020 1056   CREATININE 0.89 03/22/2016 1201   GLUCOSE 124 (H) 07/15/2020 1056   CALCIUM 10.3 07/15/2020 1056   Stop carvedilol  Re-eevaluate BP in 6 weeks ov Continue Amlodipine Would like to incorporate ACEI / ARB next ov, if possible.

## 2021-02-26 ENCOUNTER — Other Ambulatory Visit: Payer: Self-pay | Admitting: Family Medicine

## 2021-02-26 DIAGNOSIS — I2510 Atherosclerotic heart disease of native coronary artery without angina pectoris: Secondary | ICD-10-CM

## 2021-02-26 DIAGNOSIS — E785 Hyperlipidemia, unspecified: Secondary | ICD-10-CM

## 2021-02-27 ENCOUNTER — Telehealth: Payer: Self-pay

## 2021-02-27 NOTE — Telephone Encounter (Signed)
Patient calls nurse line stating she went to [facility] to pick up this weeks Ozempic and was told the prescription costs ~800 dollars. I called the pharmacy to see what could be done. Pharmacy states 800 will be the cheapest until she meets deductible. Patient can not afford 800 dollars. Will forward to PCP and pharmacy.

## 2021-03-01 ENCOUNTER — Other Ambulatory Visit: Payer: Self-pay | Admitting: Family Medicine

## 2021-03-01 DIAGNOSIS — E1159 Type 2 diabetes mellitus with other circulatory complications: Secondary | ICD-10-CM

## 2021-03-01 DIAGNOSIS — E1140 Type 2 diabetes mellitus with diabetic neuropathy, unspecified: Secondary | ICD-10-CM

## 2021-03-25 ENCOUNTER — Other Ambulatory Visit: Payer: Self-pay | Admitting: Family Medicine

## 2021-03-25 DIAGNOSIS — E1142 Type 2 diabetes mellitus with diabetic polyneuropathy: Secondary | ICD-10-CM

## 2021-03-25 DIAGNOSIS — G2581 Restless legs syndrome: Secondary | ICD-10-CM

## 2021-04-25 ENCOUNTER — Ambulatory Visit: Payer: BC Managed Care – PPO

## 2021-05-09 NOTE — Progress Notes (Deleted)
05/10/21- 54 yoF for sleep evaluation with concern of OSA and snoring, cocurtesy of Dr McDiarmid Medical problem list includes DM2/ neuropathy,  CAD, NSTEMI, HTN, CHF, GERD, DDD, Restrictive PFT,  Depression, Restless Legs,  NPSG 04/07/16- RDI 11.1/ hr, desat into 80%, body weight 228 lbs CPAP? Epworth score- Body weight today- Covid vax-

## 2021-05-10 ENCOUNTER — Institutional Professional Consult (permissible substitution): Payer: BC Managed Care – PPO | Admitting: Internal Medicine

## 2021-06-06 ENCOUNTER — Other Ambulatory Visit: Payer: Self-pay | Admitting: Family Medicine

## 2021-06-06 NOTE — Telephone Encounter (Signed)
Insulin stopped at office visit in March 2022

## 2021-06-23 ENCOUNTER — Other Ambulatory Visit: Payer: Self-pay | Admitting: Family Medicine

## 2021-06-23 DIAGNOSIS — G2581 Restless legs syndrome: Secondary | ICD-10-CM

## 2021-06-23 DIAGNOSIS — E1142 Type 2 diabetes mellitus with diabetic polyneuropathy: Secondary | ICD-10-CM

## 2021-06-26 ENCOUNTER — Other Ambulatory Visit: Payer: Self-pay | Admitting: Family Medicine

## 2021-06-26 DIAGNOSIS — E1159 Type 2 diabetes mellitus with other circulatory complications: Secondary | ICD-10-CM

## 2021-07-13 ENCOUNTER — Encounter: Payer: Self-pay | Admitting: Family Medicine

## 2021-07-13 ENCOUNTER — Ambulatory Visit (INDEPENDENT_AMBULATORY_CARE_PROVIDER_SITE_OTHER): Payer: BC Managed Care – PPO | Admitting: Family Medicine

## 2021-07-13 ENCOUNTER — Other Ambulatory Visit: Payer: Self-pay

## 2021-07-13 VITALS — BP 148/77 | HR 72 | Ht 63.0 in | Wt 219.8 lb

## 2021-07-13 DIAGNOSIS — E1140 Type 2 diabetes mellitus with diabetic neuropathy, unspecified: Secondary | ICD-10-CM | POA: Diagnosis not present

## 2021-07-13 DIAGNOSIS — E1159 Type 2 diabetes mellitus with other circulatory complications: Secondary | ICD-10-CM | POA: Diagnosis not present

## 2021-07-13 DIAGNOSIS — Z6838 Body mass index (BMI) 38.0-38.9, adult: Secondary | ICD-10-CM

## 2021-07-13 DIAGNOSIS — Z0000 Encounter for general adult medical examination without abnormal findings: Secondary | ICD-10-CM

## 2021-07-13 DIAGNOSIS — Z955 Presence of coronary angioplasty implant and graft: Secondary | ICD-10-CM

## 2021-07-13 DIAGNOSIS — I152 Hypertension secondary to endocrine disorders: Secondary | ICD-10-CM

## 2021-07-13 DIAGNOSIS — I2510 Atherosclerotic heart disease of native coronary artery without angina pectoris: Secondary | ICD-10-CM

## 2021-07-13 DIAGNOSIS — E785 Hyperlipidemia, unspecified: Secondary | ICD-10-CM

## 2021-07-13 LAB — POCT GLYCOSYLATED HEMOGLOBIN (HGB A1C): HbA1c, POC (controlled diabetic range): 5.8 % (ref 0.0–7.0)

## 2021-07-13 MED ORDER — OZEMPIC (2 MG/DOSE) 8 MG/3ML ~~LOC~~ SOPN: 2.0000 mg | PEN_INJECTOR | SUBCUTANEOUS | 3 refills | Status: DC

## 2021-07-13 NOTE — Patient Instructions (Signed)
Your A1c is outstanding at 5.8%.  Keep up what your are doing.   Let's increase the Ozempic to 2 mg injected every week.  You may use up your current ozempic by gining yourself two injection on the same day.    Your weight is down 28 pouunds from  February.  Great Job!  Let's plan to do a Pap on your next office visit in 3 months.  We will check blood work at that time.

## 2021-07-13 NOTE — Progress Notes (Signed)
c 

## 2021-07-14 ENCOUNTER — Encounter: Payer: Self-pay | Admitting: Family Medicine

## 2021-07-14 DIAGNOSIS — Z6838 Body mass index (BMI) 38.0-38.9, adult: Secondary | ICD-10-CM | POA: Insufficient documentation

## 2021-07-14 DIAGNOSIS — Z955 Presence of coronary angioplasty implant and graft: Secondary | ICD-10-CM

## 2021-07-14 DIAGNOSIS — Z6841 Body Mass Index (BMI) 40.0 and over, adult: Secondary | ICD-10-CM | POA: Insufficient documentation

## 2021-07-14 HISTORY — DX: Presence of coronary angioplasty implant and graft: Z95.5

## 2021-07-14 NOTE — Assessment & Plan Note (Signed)
Established problem Well Controlled. No signs of complications, medication side effects, or red flags. Continue current medications and other regiments.  

## 2021-07-14 NOTE — Assessment & Plan Note (Addendum)
Established problem. Stable. No signs of complications, medication side effects, or red flags. Continue current medications and other regiments.  Need to recommend patient restart ASA 81 mg daily

## 2021-07-14 NOTE — Assessment & Plan Note (Signed)
Next office visit is just for PAP And diabetic eye exam referral, HCV, Shingrix, Prevnar20,

## 2021-07-14 NOTE — Assessment & Plan Note (Signed)
Established problem that has improved.  Lab Results  Component Value Date   HGBA1C 5.8 07/13/2021  Patient is not taking metformin nor [place]. She is taking Ozrempic 1 mg inj weekly.  Plan to increase to Ozempic 2 mg inj weekly for diabetes mellitus control and weight loss

## 2021-07-14 NOTE — Assessment & Plan Note (Signed)
Established problem that has improved.  26 pound weight loss since February.   Plan: Increase Ozempic to 2 mg inj weekly RETURN TO CLINIC 3 months.

## 2021-07-14 NOTE — Progress Notes (Signed)
[name] is alone Sources of clinical information for visit is/are patient and past medical records. Nursing assessment for this office visit was reviewed with the patient for accuracy and revision.     Previous Report(s) Reviewed: office notes  Depression screen PHQ 2/9 07/13/2021  Decreased Interest 1  Down, Depressed, Hopeless 2  PHQ - 2 Score 3  Altered sleeping 1  Tired, decreased energy 3  Change in appetite 1  Feeling bad or failure about yourself  2  Trouble concentrating 2  Moving slowly or fidgety/restless 0  Suicidal thoughts 1  PHQ-9 Score 13  Difficult doing work/chores -  Some recent data might be hidden    Fall Risk  07/21/2020 04/24/2016 04/27/2014  Falls in the past year? 0 Yes No  Number falls in past yr: - 1 -  Injury with Fall? - Yes -  Risk Factor Category  - High Fall Risk -  Risk for fall due to : - History of fall(s);Other (Comment) -  Risk for fall due to: Comment - Lt knee buckles, bilateral diabetic foot pain -  Follow up - Falls evaluation completed -    PHQ9 SCORE ONLY 07/13/2021 01/12/2021 12/08/2020  PHQ-9 Total Score 13 17 14     Adult vaccines due  Topic Date Due   TETANUS/TDAP  12/19/2023   PNEUMOCOCCAL POLYSACCHARIDE VACCINE AGE 587-64 HIGH RISK  Completed    Health Maintenance Due  Topic Date Due   Hepatitis C Screening  Never done   Zoster Vaccines- Shingrix (1 of 2) Never done   PAP SMEAR-Modifier  Never done   COLONOSCOPY (Pts 45-38yrs Insurance coverage will need to be confirmed)  Never done   Pneumococcal Vaccine 51-53 Years old (3 - PCV) 11/19/2014   OPHTHALMOLOGY EXAM  11/23/2014   MAMMOGRAM  Never done   COVID-19 Vaccine (3 - Pfizer risk series) 09/08/2020   FOOT EXAM  03/10/2021      History/P.E. limitations: none  Adult vaccines due  Topic Date Due   TETANUS/TDAP  12/19/2023   PNEUMOCOCCAL POLYSACCHARIDE VACCINE AGE 587-64 HIGH RISK  Completed    Diabetes Health Maintenance Due  Topic Date Due   OPHTHALMOLOGY EXAM   11/23/2014   FOOT EXAM  03/10/2021   HEMOGLOBIN A1C  01/10/2022    Health Maintenance Due  Topic Date Due   Hepatitis C Screening  Never done   Zoster Vaccines- Shingrix (1 of 2) Never done   PAP SMEAR-Modifier  Never done   COLONOSCOPY (Pts 45-62yrs Insurance coverage will need to be confirmed)  Never done   Pneumococcal Vaccine 17-33 Years old (3 - PCV) 11/19/2014   OPHTHALMOLOGY EXAM  11/23/2014   MAMMOGRAM  Never done   COVID-19 Vaccine (3 - Pfizer risk series) 09/08/2020   FOOT EXAM  03/10/2021     Chief Complaint  Patient presents with   Energy low   Blood work

## 2021-08-22 ENCOUNTER — Other Ambulatory Visit: Payer: Self-pay

## 2021-08-22 ENCOUNTER — Ambulatory Visit (INDEPENDENT_AMBULATORY_CARE_PROVIDER_SITE_OTHER): Payer: BC Managed Care – PPO | Admitting: Podiatry

## 2021-08-22 DIAGNOSIS — E1159 Type 2 diabetes mellitus with other circulatory complications: Secondary | ICD-10-CM | POA: Diagnosis not present

## 2021-08-22 DIAGNOSIS — M79674 Pain in right toe(s): Secondary | ICD-10-CM

## 2021-08-22 DIAGNOSIS — B351 Tinea unguium: Secondary | ICD-10-CM

## 2021-08-22 DIAGNOSIS — M79675 Pain in left toe(s): Secondary | ICD-10-CM | POA: Diagnosis not present

## 2021-08-23 NOTE — Progress Notes (Signed)
  Subjective:  Patient ID: [name], female    DOB: 09-11-1966,  MRN: [id number]  Chief Complaint  Patient presents with   Nail Problem    Thick painful toenails, 3 month follow up    55 y.o. female returns with the above complaint. History confirmed with patient.   The callus and wound remains healed.  Her nails become elongated and thickened and painful  Objective:  Physical Exam: warm, good capillary refill and normal DP and PT pulses.  Loss of protective sensation bilaterally with gross neuropathy.  Heloma molle significantly reduced in size, no recurrence of ulceration.  There is thickening of the nail plates x9 with yellow discoloration, dystrophy and incurvation. Assessment:   1. Pain due to onychomycosis of toenails of both feet   2. Type 2 diabetes mellitus with other circulatory complications [facility])       Plan:  Patient was evaluated and treated and all questions answered.    Patient educated on diabetes. Discussed proper diabetic foot care and discussed risks and complications of disease. Educated patient in depth on reasons to return to the office immediately should he/she discover anything concerning or new on the feet. All questions answered. Discussed proper shoes as well.   Discussed the etiology and treatment options for the condition in detail with the patient. Educated patient on the topical and oral treatment options for mycotic nails. Recommended debridement of the nails today. Sharp and mechanical debridement performed of all painful and mycotic nails today. Nails debrided in length and thickness using a nail nipper to level of comfort. Discussed treatment options including appropriate shoe gear. Follow up as needed for painful nails.      Return in about 3 months (around 11/22/2021) for at risk diabetic foot care.

## 2021-09-29 ENCOUNTER — Other Ambulatory Visit: Payer: Self-pay | Admitting: Family Medicine

## 2021-09-29 DIAGNOSIS — I214 Non-ST elevation (NSTEMI) myocardial infarction: Secondary | ICD-10-CM

## 2021-09-29 DIAGNOSIS — I10 Essential (primary) hypertension: Secondary | ICD-10-CM

## 2021-09-29 DIAGNOSIS — I2510 Atherosclerotic heart disease of native coronary artery without angina pectoris: Secondary | ICD-10-CM

## 2021-09-29 DIAGNOSIS — E1159 Type 2 diabetes mellitus with other circulatory complications: Secondary | ICD-10-CM

## 2021-11-03 ENCOUNTER — Other Ambulatory Visit: Payer: Self-pay | Admitting: Family Medicine

## 2021-11-03 DIAGNOSIS — E1142 Type 2 diabetes mellitus with diabetic polyneuropathy: Secondary | ICD-10-CM

## 2021-11-03 DIAGNOSIS — G2581 Restless legs syndrome: Secondary | ICD-10-CM

## 2021-11-06 ENCOUNTER — Telehealth: Payer: Self-pay | Admitting: Family Medicine

## 2021-11-06 DIAGNOSIS — G2581 Restless legs syndrome: Secondary | ICD-10-CM

## 2021-11-06 DIAGNOSIS — E1142 Type 2 diabetes mellitus with diabetic polyneuropathy: Secondary | ICD-10-CM

## 2021-11-06 MED ORDER — GABAPENTIN 600 MG PO TABS: 1200.0000 mg | ORAL_TABLET | Freq: Three times a day (TID) | ORAL | 0 refills | Status: DC

## 2021-11-06 NOTE — Telephone Encounter (Signed)
**  After Hours/ Emergency Line Call**  Patient calls after hours emergency line requesting refill on her gabapentin. States she is in desperate need of her gabapentin and completely ran out a few days ago. Has been unable to get a refill due to the holiday weekend.  She takes 1200mg  TID for restless legs and painful diabetic neuropathy.  Rx sent for 18 tabs (3 day supply). PCP to fill remainder of gabapentin as appropriate. Will route to PCP as FYI.   Alcus Dad, MD PGY-2, [place] [facility] 11/06/2021 12:22 PM

## 2021-11-07 ENCOUNTER — Encounter: Payer: Self-pay | Admitting: Family Medicine

## 2021-11-17 ENCOUNTER — Telehealth: Payer: Self-pay

## 2021-11-17 DIAGNOSIS — E1140 Type 2 diabetes mellitus with diabetic neuropathy, unspecified: Secondary | ICD-10-CM

## 2021-11-17 NOTE — Telephone Encounter (Signed)
Patient calls nurse line regarding issues with gabapentin rx. Patient takes 2 tablets of gabapentin 3 times daily. However, this month she has only received 78/180 pills that she normally receives monthly.   On 1/2 she picked up 18 tablets On 1/3 she picked up 60 tablets  Together this gave patient 13 day supply.   Please advise if remaining 102 pills can be sent to pharmacy.   Talbot Grumbling, RN

## 2021-11-20 MED ORDER — GABAPENTIN 600 MG PO TABS: 600.0000 mg | ORAL_TABLET | Freq: Three times a day (TID) | ORAL | 0 refills | Status: DC

## 2021-11-20 NOTE — Telephone Encounter (Signed)
I sent in Rx Gabapentin 102 tbs ,RF zero, to make up for pharmacy short fills this month

## 2021-11-21 ENCOUNTER — Other Ambulatory Visit: Payer: Self-pay

## 2021-11-21 ENCOUNTER — Ambulatory Visit (INDEPENDENT_AMBULATORY_CARE_PROVIDER_SITE_OTHER): Payer: BC Managed Care – PPO | Admitting: Family Medicine

## 2021-11-21 ENCOUNTER — Encounter: Payer: Self-pay | Admitting: Family Medicine

## 2021-11-21 ENCOUNTER — Telehealth: Payer: Self-pay

## 2021-11-21 VITALS — BP 189/76 | HR 73 | Wt 224.0 lb

## 2021-11-21 DIAGNOSIS — I152 Hypertension secondary to endocrine disorders: Secondary | ICD-10-CM

## 2021-11-21 DIAGNOSIS — K5900 Constipation, unspecified: Secondary | ICD-10-CM | POA: Diagnosis not present

## 2021-11-21 DIAGNOSIS — E1159 Type 2 diabetes mellitus with other circulatory complications: Secondary | ICD-10-CM | POA: Diagnosis not present

## 2021-11-21 MED ORDER — SENNA 8.6 MG PO TABS: 1.0000 | ORAL_TABLET | Freq: Every day | ORAL | 0 refills | Status: DC

## 2021-11-21 MED ORDER — POLYETHYLENE GLYCOL 3350 17 GM/SCOOP PO POWD: 17.0000 g | Freq: Two times a day (BID) | ORAL | 0 refills | Status: DC

## 2021-11-21 NOTE — Telephone Encounter (Signed)
Patient calls nurse line reporting constipation. Patient reports she has not had a BM in ~ 3 weeks. Patient reports she tried a fleet enema 3 days ago with no success. Patient reports abdominal pain and bloating. Patient denies fever/chills or nausea vomiting.   Patient scheduled for this afternoon for evaluation. Patient declined going to the emergency room due to wait times.
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
## 2021-11-21 NOTE — Patient Instructions (Addendum)
It was nice seeing you today!  Take senna once a day until you have bowel movement.  Take Miralax twice a day (can be increased to 3 or 4 times a day if needed). After you have a bowel movement, take Miralax once a day.  List of therapists below.  Please give [place] a call or go to the ED if you develop severe abdominal pain, fever, or vomiting.  Return if not improving or schedule a follow-up visit in 1 week if you'd like.  Make sure to follow-up with your PCP for your chronic issues.  Stay well, Zola Button, MD [place] [phone]  --  Make sure to check out at the front desk before you leave today.  Please arrive at least 15 minutes prior to your scheduled appointments.  If you had blood work today, I will send you a MyChart message or a letter if results are normal. Otherwise, I will give you a call.  If you had a referral placed, they will call you to set up an appointment. Please give [place] a call if you don't hear back in the next 2 weeks.  If you need additional refills before your next appointment, please call your pharmacy first.  --   Therapy and Counseling Resources Most providers on this list will take Medicaid. Patients with commercial insurance or Medicare should contact their insurance company to get a list of in network providers.  [facility]  [place], [place], [place] [address], [place] [email or web address] [phone]  [facility] Counseling [address] [place], [place] [address] [place], [place], [place] [address]      [facility] [address]  [place], [place] [address] [phone]  [place] [address], [place]  [place], [place] [address]       [phone]     [facility] (virtual only) [phone]  Jinny Blossom [facility] [address]  [place], [place]  [facility]:  [place]. [place] [phone]  [facility]:  [address] [place] [phone]  [facility] (under & uninsured) [address], [place] [phone]    [email or web address]    [place] [phone] [address]  [place]    [phone]  [facility] [place]     Phone:  [phone]     [place]  [place] [address]      [place], [place] [address] [place]: [place], [place], [place]   [place] ([place] therapist) [email or web address]  [place] [address]   [place] [address]    [phone]    [facility]   [address],  [place], [place]   [place]  [place]  [address] [place], [place]        [phone]  Open Access/Walk In Clinic under & uninsured  [facility]  [address] [place], [place] Crisis [phone]  [facility] the [place],  ([place])   [place], [place]: [phone]) 8:30 - 12;  1 - 2:30  [facility] the [place],  [address], [place]    [phone]):8:30 - 12; 2 - 3PM  [facility] [employer],  [address],  [place]; [phone]):   Mon - Fri 8 AM - 5 PM  [facility] [place]  MWF 12:30 to 3:00 or call to schedule an appointment  [phone]  Specific Provider options Psychology Today  [email or web address] click on find a therapist  enter your zip code left side and select or tailor a therapist for your specific need.   [facility] Provider  Directory [email or web address]  (Medicaid)   Follow all drop down to find a provider  [place] or [email or web address] [address], [place], [place] Recovery support and educational   24- Hour Availability:   [facility]  [address] [place], [place] Crisis [phone]  [facility] of the [employer] [phone]  [place]  [phone]   [place]  [phone] (after hours)  Therapeutic Alternative/Mobile Crisis   [phone]  [place] National Suicide Hotline  [phone] Diamantina Monks)  Call 911 or go to emergency room  [facility]  [phone]);  [place] and [employer]  [phone]); [place], [place], [place], [place], [place], [place], [place]
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
## 2021-11-21 NOTE — Assessment & Plan Note (Signed)
Significantly elevated today possibly due to stress and discomfort from constipation.  Outside readings appear to be well controlled, so will not make any changes today.  Advised to continue monitoring BP.

## 2021-11-23 ENCOUNTER — Other Ambulatory Visit: Payer: Self-pay | Admitting: Family Medicine

## 2021-11-23 DIAGNOSIS — E1140 Type 2 diabetes mellitus with diabetic neuropathy, unspecified: Secondary | ICD-10-CM

## 2021-12-12 ENCOUNTER — Other Ambulatory Visit: Payer: Self-pay | Admitting: Family Medicine

## 2021-12-12 DIAGNOSIS — E1140 Type 2 diabetes mellitus with diabetic neuropathy, unspecified: Secondary | ICD-10-CM

## 2021-12-12 NOTE — Telephone Encounter (Signed)
Patient calls nurse line regarding gabapentin rx. Patient reports that she takes 600 mg gabapentin- 2 tablets by mouth three times per day.   Patient is requesting that quantity be updated to a one month supply (180 tablets)  Please advise.   Veronda Prude, RN

## 2021-12-12 NOTE — Telephone Encounter (Signed)
Patient calls nurse line again in regards to Gabapentin prescription. Patient reports she has to be at work at 5 and would like to pick this up before then.   Will forward to PCP.

## 2021-12-14 NOTE — Telephone Encounter (Signed)
Patient returns call to nurse line regarding prescription. Patient states that she is in a lot of pain and has difficulties working without medication.   Spoke to preceptor Leveda Anna), as PCP is currently out of the office. Dr. Leveda Anna sent in this refill.   Called and informed patient. Patient very appreciative.   Veronda Prude, RN

## 2022-01-14 ENCOUNTER — Other Ambulatory Visit: Payer: Self-pay | Admitting: Family Medicine

## 2022-01-14 DIAGNOSIS — E1140 Type 2 diabetes mellitus with diabetic neuropathy, unspecified: Secondary | ICD-10-CM

## 2022-02-12 ENCOUNTER — Other Ambulatory Visit: Payer: Self-pay | Admitting: Family Medicine

## 2022-02-12 DIAGNOSIS — E1140 Type 2 diabetes mellitus with diabetic neuropathy, unspecified: Secondary | ICD-10-CM

## 2022-03-16 ENCOUNTER — Other Ambulatory Visit: Payer: Self-pay | Admitting: Family Medicine

## 2022-03-16 DIAGNOSIS — E1140 Type 2 diabetes mellitus with diabetic neuropathy, unspecified: Secondary | ICD-10-CM

## 2022-03-16 NOTE — Telephone Encounter (Signed)
This is a pretty high dose of Gabapentin. I will refill for a few days and defer refill and monitoring to her PCP ?

## 2022-04-09 ENCOUNTER — Other Ambulatory Visit: Payer: Self-pay | Admitting: Family Medicine

## 2022-04-09 DIAGNOSIS — E1140 Type 2 diabetes mellitus with diabetic neuropathy, unspecified: Secondary | ICD-10-CM

## 2022-04-10 ENCOUNTER — Other Ambulatory Visit: Payer: Self-pay | Admitting: Family Medicine

## 2022-04-10 ENCOUNTER — Encounter: Payer: Self-pay | Admitting: *Deleted

## 2022-04-10 DIAGNOSIS — E1140 Type 2 diabetes mellitus with diabetic neuropathy, unspecified: Secondary | ICD-10-CM

## 2022-04-10 DIAGNOSIS — E1159 Type 2 diabetes mellitus with other circulatory complications: Secondary | ICD-10-CM

## 2022-04-10 NOTE — Telephone Encounter (Signed)
Patient needs appointment with Dr Rufus Cypert  before further refills  

## 2022-04-11 NOTE — Telephone Encounter (Signed)
Please contact Ms Waring to see what dose of Ozempic (semaglutide) she is taking now.

## 2022-04-13 ENCOUNTER — Telehealth: Payer: Self-pay

## 2022-04-13 NOTE — Telephone Encounter (Signed)
Please contact patient to see what dose of Ozempic she is taking currently

## 2022-04-13 NOTE — Telephone Encounter (Signed)
Attempted to reach patient to see what dosage of Ozempic she is taking. Number was out of services. Also tried daughters phone number. It gave a busy signal. Aquilla Solian, CMA

## 2022-04-16 NOTE — Progress Notes (Signed)
    SUBJECTIVE:   CHIEF COMPLAINT / HPI:   Diabetic Follow Up: Patient is a 56 y.o. female who present today for diabetic follow up.   Patient endorses  running out of her Ozempic last week.  Home medications include: ozempic 2mg  weekly  Patient endorses taking these medications as prescribed.  Most recent A1Cs:  Lab Results  Component Value Date   HGBA1C 6.1 04/17/2022   HGBA1C 5.8 07/13/2021   HGBA1C 7.3 (A) 12/08/2020   Last Microalbumin, LDL, Creatinine: Lab Results  Component Value Date   LDLCALC 58 01/15/2019   CREATININE 0.79 07/15/2020   Patient is not up to date on diabetic foot exam.   PERTINENT  PMH / PSH: T2DM  OBJECTIVE:   BP (!) 156/82   Pulse 66   Ht 5\' 3"  (1.6 m)   Wt 232 lb 6.4 oz (105.4 kg)   LMP 01/13/2018 (LMP Unknown)   SpO2 97%   BMI 41.17 kg/m    BP recheck manual - 132/82  Diabetic foot exam was performed.  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  Sensation: mildly reduced on left side  General: NAD, pleasant, able to participate in exam Respiratory: No respiratory distress Skin: warm and dry, no rashes noted Psych: Normal affect and mood  ASSESSMENT/PLAN:   Type 2 diabetes mellitus with other circulatory complications (HCC) A1c at goal at 6.1.  Continues on Ozempic.  She ran out of this last month and I have refilled it.  Provided diabetic foot exam today.  She is going to follow-up in 3 months for an A1c check.   Positive Question #9 on PHQ  passive SI: States she sometimes has passive thoughts of harming self. No plan and no active SI. States this is due to her housing and work situation.  States her main issue is ensuring that she has enough money to afford her rent as well as food.  She current lives at home with her daughter and granddaughter.  Her daughter has had an injury and has not been able to work for a couple of months which puts greater  financial strain on her covering the rent and food for her, her daughter and her granddaughter.  Discussed that it seems reasonable to place a CCM referral to see how we can assist her up for food insecurity.  Discussed options for therapy as well with the patient and ultimately opted to provide resources for this.  Food insecurity: This was the cause of her positive question #9 on PHQ as noted above.  States that she lives at home with her daughter and granddaughter and her daughter normally works to help cover her rent and support the household as well as her working but currently her daughter is injured and so she is providing the entirety of the rent as well as food for her daughter and granddaughter and she struggles to put food on the table.  We will place a CCM referral to see how we can help with this.   , DO [facility]

## 2022-04-17 ENCOUNTER — Encounter: Payer: Self-pay | Admitting: Family Medicine

## 2022-04-17 ENCOUNTER — Ambulatory Visit (INDEPENDENT_AMBULATORY_CARE_PROVIDER_SITE_OTHER): Payer: BC Managed Care – PPO | Admitting: Family Medicine

## 2022-04-17 VITALS — BP 132/82 | HR 66 | Ht 63.0 in | Wt 232.4 lb

## 2022-04-17 DIAGNOSIS — R45851 Suicidal ideations: Secondary | ICD-10-CM | POA: Diagnosis not present

## 2022-04-17 DIAGNOSIS — E1159 Type 2 diabetes mellitus with other circulatory complications: Secondary | ICD-10-CM

## 2022-04-17 DIAGNOSIS — Z5941 Food insecurity: Secondary | ICD-10-CM | POA: Diagnosis not present

## 2022-04-17 LAB — POCT GLYCOSYLATED HEMOGLOBIN (HGB A1C): HbA1c, POC (controlled diabetic range): 6.1 % (ref 0.0–7.0)

## 2022-04-17 MED ORDER — OZEMPIC (2 MG/DOSE) 8 MG/3ML ~~LOC~~ SOPN: 2.0000 mg | PEN_INJECTOR | SUBCUTANEOUS | 3 refills | Status: DC

## 2022-04-17 NOTE — Patient Instructions (Signed)
For your difficulty with getting enough food I have placed a referral for our social work group and they should reach out to you in the next week or so.  I also recommend reaching out to [employer].  You can call them by dialing [phone] on your phone.  When you do so to explain the problem to them and see if there are any options they have to to help.  As we discussed if you are interested in doing any counseling to help with depression and your current situation I am including some contact information below. For information on therapists, please go to [email or web address]. You can also contact your insurance company to find an in-network therapist.   Your A1c was excellent today and I have refilled your Ozempic.  We should recheck your A1c in about 3 months.

## 2022-04-17 NOTE — Assessment & Plan Note (Signed)
A1c at goal at 6.1.  Continues on Ozempic.  She ran out of this last month and I have refilled it.  Provided diabetic foot exam today.  She is going to follow-up in 3 months for an A1c check.

## 2022-04-18 ENCOUNTER — Telehealth: Payer: Self-pay | Admitting: *Deleted

## 2022-04-18 NOTE — Telephone Encounter (Signed)
   Telephone encounter was:  Unsuccessful.  04/18/2022 Name: BRILEIGH SEVCIK MRN: [id number] DOB: 1966-06-04  Unsuccessful outbound call made today to assist with:  Transportation Needs , Food Insecurity, and Financial Difficulties related to rent  Outreach Attempt:  1st Attempt  patient number not in [phone] and daughters number does not dial out  Electronics engineer -[facility] Guide , Embedded Care Coordination [facility], Care Management  [phone] [address] [place] , [place] [address] Email : [email or web address]

## 2022-04-27 ENCOUNTER — Telehealth: Payer: Self-pay | Admitting: Family Medicine

## 2022-04-27 ENCOUNTER — Other Ambulatory Visit: Payer: Self-pay | Admitting: Family Medicine

## 2022-04-27 DIAGNOSIS — E1140 Type 2 diabetes mellitus with diabetic neuropathy, unspecified: Secondary | ICD-10-CM

## 2022-04-28 ENCOUNTER — Telehealth: Payer: Self-pay | Admitting: Family Medicine

## 2022-04-28 DIAGNOSIS — E1140 Type 2 diabetes mellitus with diabetic neuropathy, unspecified: Secondary | ICD-10-CM

## 2022-04-28 MED ORDER — GABAPENTIN 600 MG PO TABS: 1200.0000 mg | ORAL_TABLET | Freq: Three times a day (TID) | ORAL | 0 refills | Status: DC

## 2022-05-01 ENCOUNTER — Telehealth: Payer: Self-pay

## 2022-05-01 ENCOUNTER — Other Ambulatory Visit (HOSPITAL_COMMUNITY): Payer: Self-pay

## 2022-05-01 ENCOUNTER — Telehealth: Payer: Self-pay | Admitting: *Deleted

## 2022-05-01 NOTE — Telephone Encounter (Signed)
Patient calls nurse line regarding gabapentin prescription. Patient states that she has been unable to pick this up. Called pharmacy. Pharmacist reports this will be ready at 1:00 this afternoon.   Patient reports that Ozempic needs PA. Will forward to [place] to initiate PA.   Veronda Prude, RN

## 2022-05-02 NOTE — Telephone Encounter (Signed)
   Telephone encounter was:  Unsuccessful.  05/02/2022 Name: [name] MRN: [id number] DOB: 11/29/1965  Unsuccessful outbound call made today to assist with:  Food Insecurity  Outreach Attempt:  3rd Attempt.  Referral closed unable to contact patient.  Number not inservice  Alois Cliche -[facility] Guide , Embedded Care Coordination [facility], Care Management  [phone] [address] [place] , [place] [address] Email : [email or web address]

## 2022-05-04 ENCOUNTER — Telehealth: Payer: Self-pay | Admitting: *Deleted

## 2022-05-04 NOTE — Telephone Encounter (Signed)
   Telephone encounter was:  Unsuccessful.  05/04/2022 Name: NALANIE WINIECKI MRN: [id number] DOB: Feb 14, 1966  Unsuccessful outbound call made today to assist with:  Food Insecurity  Outreach Attempt:  3rd Attempt.  Referral closed unable to contact patient.   Alois Cliche -[facility] Guide , Embedded Care Coordination [facility], Care Management  [phone] [address] [place] , [place] [address] Email : [email or web address]

## 2022-05-28 ENCOUNTER — Telehealth: Payer: Self-pay

## 2022-05-28 ENCOUNTER — Telehealth: Payer: Self-pay | Admitting: *Deleted

## 2022-05-28 NOTE — Telephone Encounter (Signed)
Patient calls nurse line in regards to Ozempic.   Patient reports she has not had an injection since 2 weeks ago. Patient reports the product is on back order and she is on a waiting list at her pharmacy.   Patient would like to know if there is something else she can be switched to or if it is ok to "wait it out."   Will forward to PCP.

## 2022-05-28 NOTE — Chronic Care Management (AMB) (Signed)
  Care Coordination  Note  05/28/2022 Name: AMARACHUKWU LAKATOS MRN: [id number] DOB: [birth year]  Raye EMMERSON SHUFFIELD is a 56 y.o. year old female who is a primary care patient of McDiarmid, Leighton Roach, MD. I reached out to Val Riles by phone today to offer care coordination services.       Follow up plan: Unsuccessful telephone outreach attempt made. A HIPAA compliant phone message was left for the patient providing contact information and requesting a return call.  The care guide will reach out to the patient again over the next 7 days.  If patient calls provider office to request assistance with care coordination needs, please contact the care guide at the number below.   [facility]  Care Coordination Care Guide  Direct Dial: [phone]

## 2022-05-29 NOTE — Telephone Encounter (Signed)
Spoke with patient informed of message. Patient understood. Aquilla Solian, CMA

## 2022-05-29 NOTE — Telephone Encounter (Signed)
Please let Ms Linson know our pharmacists will see if there are other medications like Ozempic available.

## 2022-05-31 NOTE — Progress Notes (Signed)
SUBJECTIVE:   CHIEF COMPLAINT / HPI:  Chief Complaint  Patient presents with   Knee Pain   Fall    Patient had a fall at work about 10 days ago where she fell over while moving a trash can, fell onto both knees and then rest of the body fell forward onto the ground.  She has had pain in both of her knees since that time, worse on the right.  She has noticed a bump on her left patellar area.  She has noticed the right knee feels unstable, sometimes about to give out.  She has been taking ibuprofen as needed for pain with some relief.  She has been able to ambulate without too much difficulty, has continued to work which requires standing for long periods of time.  She reports stiffness in her knees lasting less than 30 minutes.  She has pain radiating from her lower back to the back of her right knee.  She has had issues of sciatica in the past but not recently.  PERTINENT  PMH / PSH: T2DM, obesity, CAD, OA right knee  Patient Care Team: McDiarmid, Leighton Roach, MD as PCP - General (Family Medicine)   OBJECTIVE:   BP (!) 169/75   Pulse 66   Ht 5\' 3"  (1.6 m)   Wt 236 lb 12.8 oz (107.4 kg)   LMP 01/13/2018 (LMP Unknown)   SpO2 99%   BMI 41.95 kg/m   Physical Exam Constitutional:      General: She is not in acute distress. Cardiovascular:     Rate and Rhythm: Normal rate.  Pulmonary:     Effort: Pulmonary effort is normal. No respiratory distress.  Musculoskeletal:     Cervical back: Neck supple.     Comments: Abrasion noted on the anterior right knee.  No obvious swelling though difficult to assess due to body habitus.  There is a firm circumscribed area of swelling approximately 1.5 cm on the left patella.  No tenderness to palpation along the medial or lateral joint lines of bilateral knees.  Some discomfort elicited with passive flexion and extension of the right knee, otherwise left knee with normal ROM.  Full strength in bilateral knees but there is pain elicited with resisted  flexion and extension on the right.  Negative Lachman, posterior drawer, varus/valgus stressing, and McMurray testing bilaterally, though McMurray was difficult to assess on the right knee due to limited hip flexion which was limited by pain.  Straight leg raise was positive on the right with flexion to approximately 20 degrees.  Neurological:     Mental Status: She is alert.         06/01/2022    2:35 PM  Depression screen PHQ 2/9  Decreased Interest 2  Down, Depressed, Hopeless 1  PHQ - 2 Score 3  Altered sleeping 1  Tired, decreased energy 2  Change in appetite 2  Feeling bad or failure about yourself  1  Trouble concentrating 3  Moving slowly or fidgety/restless 0  PHQ-9 Score 12  Difficult doing work/chores Somewhat difficult     {Show previous vital signs (optional):23777}    ASSESSMENT/PLAN:   Fall Primarily presenting with bilateral knee pain which has persisted since a fall 10 days ago.  Possible worsening of OA, tibial plateau fracture, worsening of sciatica related to fall.  She may have a hematoma on the left knee.  Ligaments appear to be preserved, patient has been ambulating without too much difficulty.  We will pursue  imaging and treat symptomatically with short course of NSAIDs. - XR left and right knee complete - naproxen BID prn  Return in about 2 weeks (around 06/15/2022) for f/u knee pain.   Littie Deeds, MD [facility]

## 2022-05-31 NOTE — Patient Instructions (Addendum)
It was nice seeing you today!  You can take naproxen up to twice a day as needed for pain. Take with food. Do not take ibuprofen. You can take Tylenol with this.   Get your X-rays at: [facility] Address: [address], [place], [place] [address] Phone: [phone]   Stay well, Littie Deeds, MD [facility] [phone]  --  Make sure to check out at the front desk before you leave today.  Please arrive at least 15 minutes prior to your scheduled appointments.  If you had blood work today, I will send you a MyChart message or a letter if results are normal. Otherwise, I will give you a call.  If you had a referral placed, they will call you to set up an appointment. Please give [place] a call if you don't hear back in the next 2 weeks.  If you need additional refills before your next appointment, please call your pharmacy first.

## 2022-06-01 ENCOUNTER — Ambulatory Visit (INDEPENDENT_AMBULATORY_CARE_PROVIDER_SITE_OTHER): Payer: BC Managed Care – PPO | Admitting: Family Medicine

## 2022-06-01 VITALS — BP 169/75 | HR 66 | Ht 63.0 in | Wt 236.8 lb

## 2022-06-01 DIAGNOSIS — M25561 Pain in right knee: Secondary | ICD-10-CM | POA: Diagnosis not present

## 2022-06-01 DIAGNOSIS — M25562 Pain in left knee: Secondary | ICD-10-CM

## 2022-06-01 DIAGNOSIS — W19XXXA Unspecified fall, initial encounter: Secondary | ICD-10-CM | POA: Diagnosis not present

## 2022-06-01 MED ORDER — NAPROXEN SODIUM 550 MG PO TABS: 550.0000 mg | ORAL_TABLET | Freq: Two times a day (BID) | ORAL | 0 refills | Status: DC | PRN

## 2022-06-05 ENCOUNTER — Telehealth: Payer: Self-pay | Admitting: Family Medicine

## 2022-06-05 DIAGNOSIS — E1159 Type 2 diabetes mellitus with other circulatory complications: Secondary | ICD-10-CM

## 2022-06-05 NOTE — Telephone Encounter (Signed)
-----   Message from Kathrin Ruddy, RPH-CPP sent at 05/31/2022  1:14 PM EDT ----- Regarding: RE: GLP-1 availability Suggest Mounjaro for this scenario.  I believe I would try conversion to 10mg  Mounjaro directly. [place], this is likley a step care/prior auth, so it may not be covered at all or less well.   We could more easily try to convert to Trulicity 4.5mg  once weekly as short-term replacement.  With plan to return to Ozempic 2mg  when that product returns to availability.    ----- Message ----- From: Jaleen Grupp, , MD Sent: 05/29/2022   9:53 AM EDT To: Leighton Roach Rx Subject: GLP-1 availability                             05/31/2022 -   Ms Legrande was on semaglutide 2 mg weekly, but now is out because of back-order.   Are there GLP-1 formulations available we may use while awaiting semaglutide availability?    Thanks for your help with this question.  Durward Mallard

## 2022-06-06 NOTE — Chronic Care Management (AMB) (Signed)
  Care Coordination  Outreach Note  06/06/2022 Name: [name] MRN: [id number] DOB: 1966-09-11   Care Coordination Outreach Attempts  A second unsuccessful outreach was attempted today to offer the patient with information about available care coordination services as a benefit of their health plan.     Follow Up Plan:  No further outreach attempts will be made at this time. We have been unable to contact the patient to offer or enroll patient in care coordination services  Encounter Outcome:  No Answer no working contact number   [facility] Coordination Care Guide  Direct Dial: [phone]

## 2022-06-07 ENCOUNTER — Other Ambulatory Visit: Payer: Self-pay | Admitting: Family Medicine

## 2022-06-07 DIAGNOSIS — E1140 Type 2 diabetes mellitus with diabetic neuropathy, unspecified: Secondary | ICD-10-CM

## 2022-06-07 NOTE — Telephone Encounter (Signed)
Patient calls nurse line regarding gabapentin refill. Patient is requesting that medication be sent in with additional refills so that she does not have to keep calling each month.   Patient also states that she is willing to start on Trulicity.   Please advise.   Veronda Prude, RN

## 2022-06-14 ENCOUNTER — Other Ambulatory Visit: Payer: Self-pay | Admitting: Family Medicine

## 2022-06-14 DIAGNOSIS — E1159 Type 2 diabetes mellitus with other circulatory complications: Secondary | ICD-10-CM

## 2022-06-14 NOTE — Progress Notes (Signed)
Prescription sent for dulaglutide 0.75 mg [place] wkly x 4 weeks then dulaglutide 1.5 mg [place] x 4weeks,  She will need office visit to follow up before further dose titration.

## 2022-06-14 NOTE — Patient Instructions (Signed)
It was great to see you! Thank you for allowing me to participate in your care!  I recommend that you always bring your medications to each appointment as this makes it easy to ensure we are on the correct medications and helps [place] not miss when refills are needed.  Our plans for today:   - We discussed your diabetes medications. We are going to restart your home Ozempic dose at 2mg . We are sending that to a different pharmacy where they have it. The address and phone number is below.   [facility] [phone] [address] [place], [place], [place]  - I have refilled your amlodipine prescription and atorvastatin, you can pick those up at your normal pharmacy.    Take care and seek immediate care sooner if you develop any concerns.   Dr. [id number], MD [facility]

## 2022-06-14 NOTE — Progress Notes (Signed)
    SUBJECTIVE:   CHIEF COMPLAINT / HPI: out of diabetes medications  Type 2 Diabetes Patient report sugars at home are 300s and 400s at home for 3 and half weeks. She has not had her ozempic because of the national shortage. Denies nausea and vomiting, endorses being forgetful, peeing a little more than normal but not a lot. States she is more Patient reports she was previously on metformin and insulin but was able to stop as her A1c came down.   Blood pressure Patient states she is currently not taking anything for blood pressures stating she her blood pressure was low enough she was able to come off of medication. Her sister in law recently passed away which she states has made her more stressed. She checks her BP when she visits the pharmacy where it has ben 140s/60s. She endorse no chest pain or headache. Does have some intermittent shortness of breath that is not exertional. Began over past week. She feels like she has can not breath for a second and has to take another breath in. It is associated with anxiety. Denies palpitations. She has not had to use her inhaler. Denies cough, fever, runny nose.   Knee pain  Patient unaware that she could go to imaging center for x rays. Still has pain and is taking aleve.   PERTINENT  PMH / PSH: Primary osteoarthritis, T2DM, HTN  OBJECTIVE:   BP (!) 164/60   Pulse 74   Wt 238 lb 9.6 oz (108.2 kg)   LMP 01/13/2018 (LMP Unknown)   SpO2 99%   BMI 42.27 kg/m   General: NAD sitting comfortably in chair Cardiovascular: RRR, no murmurs, no peripheral edema Respiratory: normal WOB on RA, CTAB, no wheezes, ronchi or rales Extremities: Moving all 4 extremities equally   ASSESSMENT/PLAN:   Type 2 diabetes mellitus with other circulatory complications (HCC) Sugar not controlled over past month per patient. Last A1c 6.1. Patient out of home Ozempic due to shortage. -Prescribed Ozempic 2mg  once weekly (prior dose) to [facility] -Will recheck A1c in 2-5 months  Hypertension associated with diabetes (HCC) Blood pressure elevated at home and twice today in clinic. Denies chest pain. Patient has normal lung exam, with hx patients shortness of breath likely related to anxiety in the setting of recent life stressor. -Restart patient's Amlodipine 10mg  daily -Restart home Atorvastatin 40mg  daily   Knee pain Instructed to follow-up as needed after getting XR of knees. Continue taking Aleve for pain.  [facility], MD [facility]

## 2022-06-15 ENCOUNTER — Other Ambulatory Visit (HOSPITAL_COMMUNITY): Payer: Self-pay

## 2022-06-15 ENCOUNTER — Encounter: Payer: Self-pay | Admitting: Family Medicine

## 2022-06-15 ENCOUNTER — Other Ambulatory Visit: Payer: Self-pay

## 2022-06-15 ENCOUNTER — Ambulatory Visit (INDEPENDENT_AMBULATORY_CARE_PROVIDER_SITE_OTHER): Payer: BC Managed Care – PPO | Admitting: Family Medicine

## 2022-06-15 DIAGNOSIS — I214 Non-ST elevation (NSTEMI) myocardial infarction: Secondary | ICD-10-CM

## 2022-06-15 DIAGNOSIS — E119 Type 2 diabetes mellitus without complications: Secondary | ICD-10-CM

## 2022-06-15 DIAGNOSIS — I2510 Atherosclerotic heart disease of native coronary artery without angina pectoris: Secondary | ICD-10-CM

## 2022-06-15 DIAGNOSIS — Z794 Long term (current) use of insulin: Secondary | ICD-10-CM

## 2022-06-15 DIAGNOSIS — E785 Hyperlipidemia, unspecified: Secondary | ICD-10-CM

## 2022-06-15 DIAGNOSIS — I10 Essential (primary) hypertension: Secondary | ICD-10-CM | POA: Diagnosis not present

## 2022-06-15 DIAGNOSIS — E1159 Type 2 diabetes mellitus with other circulatory complications: Secondary | ICD-10-CM

## 2022-06-15 DIAGNOSIS — I152 Hypertension secondary to endocrine disorders: Secondary | ICD-10-CM

## 2022-06-15 MED ORDER — OZEMPIC (2 MG/DOSE) 8 MG/3ML ~~LOC~~ SOPN
2.0000 mg | PEN_INJECTOR | SUBCUTANEOUS | 12 refills | Status: DC
  Filled 2022-06-15: qty 3, 28d supply, fill #0

## 2022-06-15 MED ORDER — BLOOD GLUCOSE MONITOR KIT
PACK | 0 refills | Status: DC
Start: 2022-06-15 — End: 2023-12-12

## 2022-06-15 MED ORDER — AMLODIPINE BESYLATE 10 MG PO TABS: 10.0000 mg | ORAL_TABLET | Freq: Every day | ORAL | 1 refills | Status: DC

## 2022-06-15 MED ORDER — SEMAGLUTIDE (2 MG/DOSE) 8 MG/3ML ~~LOC~~ SOPN
2.0000 mg | PEN_INJECTOR | SUBCUTANEOUS | 1 refills | Status: DC
  Filled 2022-06-15: qty 3, 28d supply, fill #0

## 2022-06-15 MED ORDER — ATORVASTATIN CALCIUM 40 MG PO TABS: 40.0000 mg | ORAL_TABLET | Freq: Every day | ORAL | 1 refills | Status: DC

## 2022-06-15 NOTE — Assessment & Plan Note (Signed)
Blood pressure elevated at home and twice today in clinic. Denies chest pain. Patient has normal lung exam, with hx patients shortness of breath likely related to anxiety in the setting of recent life stressor. -Restart patient's Amlodipine 10mg  daily -Restart home Atorvastatin 40mg  daily

## 2022-06-15 NOTE — Assessment & Plan Note (Signed)
Sugar not controlled over past month per patient. Last A1c 6.1. Patient out of home Ozempic due to shortage. -Prescribed Ozempic 2mg  once weekly (prior dose) to [facility] -Will recheck A1c in 2-5 months

## 2022-06-16 ENCOUNTER — Other Ambulatory Visit (HOSPITAL_COMMUNITY): Payer: Self-pay

## 2022-06-22 ENCOUNTER — Ambulatory Visit
Admission: RE | Admit: 2022-06-22 | Discharge: 2022-06-22 | Disposition: A | Discharge status: Home / Self Care | Payer: BC Managed Care – PPO | Source: Ambulatory Visit | Attending: Family Medicine | Admitting: Family Medicine

## 2022-06-22 DIAGNOSIS — M1711 Unilateral primary osteoarthritis, right knee: Secondary | ICD-10-CM | POA: Diagnosis not present

## 2022-06-22 DIAGNOSIS — M25562 Pain in left knee: Secondary | ICD-10-CM | POA: Diagnosis not present

## 2022-06-22 DIAGNOSIS — W19XXXA Unspecified fall, initial encounter: Secondary | ICD-10-CM

## 2022-07-05 ENCOUNTER — Ambulatory Visit: Payer: BC Managed Care – PPO | Admitting: Family Medicine

## 2022-07-05 ENCOUNTER — Encounter: Payer: Self-pay | Admitting: Family Medicine

## 2022-07-05 DIAGNOSIS — M1712 Unilateral primary osteoarthritis, left knee: Secondary | ICD-10-CM | POA: Insufficient documentation

## 2022-07-11 ENCOUNTER — Ambulatory Visit (INDEPENDENT_AMBULATORY_CARE_PROVIDER_SITE_OTHER): Payer: BC Managed Care – PPO | Admitting: Podiatry

## 2022-07-11 ENCOUNTER — Encounter: Payer: Self-pay | Admitting: Podiatry

## 2022-07-11 DIAGNOSIS — M79674 Pain in right toe(s): Secondary | ICD-10-CM

## 2022-07-11 DIAGNOSIS — M79675 Pain in left toe(s): Secondary | ICD-10-CM | POA: Diagnosis not present

## 2022-07-11 DIAGNOSIS — B351 Tinea unguium: Secondary | ICD-10-CM | POA: Diagnosis not present

## 2022-07-11 DIAGNOSIS — E1159 Type 2 diabetes mellitus with other circulatory complications: Secondary | ICD-10-CM

## 2022-07-11 NOTE — Progress Notes (Signed)
This patient returns to my office for at risk foot care.  This patient requires this care by a professional since this patient will be at risk due to having diabetes with neuropathy and circulatory complications.  This patient is unable to cut nails herself since the patient cannot reach her nails.These nails are painful walking and wearing shoes.  This patient presents for at risk foot care today.  General Appearance  Alert, conversant and in no acute stress.  Vascular  Dorsalis pedis and posterior tibial  pulses are palpable  bilaterally.  Capillary return is within normal limits  bilaterally. Temperature is within normal limits  bilaterally.  Neurologic  Senn-Weinstein monofilament wire test absent  bilaterally. Muscle power within normal limits bilaterally.  Nails Thick disfigured discolored nails with subungual debris  from hallux to fifth toes bilaterally. No evidence of bacterial infection or drainage bilaterally.  Orthopedic  No limitations of motion  feet .  No crepitus or effusions noted.  No bony pathology or digital deformities noted.  Skin  normotropic skin with no porokeratosis noted bilaterally.  No signs of infections or ulcers noted.     Onychomycosis  Pain in right toes  Pain in left toes  Consent was obtained for treatment procedures.   Mechanical debridement of nails 1-5  bilaterally performed with a nail nipper.  Filed with dremel without incident.    Return office visit   3 months                   Told patient to return for periodic foot care and evaluation due to potential at risk complications.   Helane Gunther DPM

## 2022-07-13 ENCOUNTER — Telehealth: Payer: Self-pay | Admitting: Family Medicine

## 2022-07-13 ENCOUNTER — Other Ambulatory Visit: Payer: Self-pay

## 2022-07-13 ENCOUNTER — Other Ambulatory Visit (HOSPITAL_COMMUNITY): Payer: Self-pay

## 2022-07-13 DIAGNOSIS — E1140 Type 2 diabetes mellitus with diabetic neuropathy, unspecified: Secondary | ICD-10-CM

## 2022-07-13 MED ORDER — OZEMPIC (2 MG/DOSE) 8 MG/3ML ~~LOC~~ SOPN: 2.0000 mg | PEN_INJECTOR | SUBCUTANEOUS | 12 refills | Status: DC

## 2022-07-13 NOTE — Telephone Encounter (Signed)
Patient calls nurse line requesting a refill on Ozempic.   Patient reports she wants refills to go to [place] moving forward. Patient reports she was unable to have the remainder of refills transferred.  I called [facility] and cancelled any remaining refills. Pharmacy reports patient last picked up 1 month supply on 8/11.  Will forward to PCP.

## 2022-07-13 NOTE — Telephone Encounter (Signed)
Patient calls nurse line in regards to Gabapentin.  Patient voiced frustration that she never has refills on this medication and has to "keep calling" each month.   Please add refills if appropriate.

## 2022-09-06 ENCOUNTER — Telehealth: Payer: Self-pay

## 2022-09-06 NOTE — Patient Outreach (Signed)
  Care Coordination   Initial Visit Note   09/06/2022 Name: ASTER SCREWS MRN: [id number] DOB: 20-May-1966  Jaila SHALLA BULLUCK is a 56 y.o. year old female who sees McDiarmid, Blane Ohara, MD for primary care. I spoke with  Reino Bellis by phone today.  What matters to the patients health and wellness today?  Making blood sugars are in line    Goals Addressed             This Visit's Progress    COMPLETED: Care Coordination Activities-No follow up required       Care Coordination Interventions: Advised patient to   schedule annual wellness visit.  Discussed [facility] services and support.  Patient declined and wants to visit physician.          SDOH assessments and interventions completed:  Yes  SDOH Interventions Today    Flowsheet Row Most Recent Value  SDOH Interventions   Housing Interventions Intervention Not Indicated  Transportation Interventions Intervention Not Indicated        Care Coordination Interventions Activated:  Yes  Care Coordination Interventions:  Yes, provided   Follow up plan: No further intervention required.   Encounter Outcome:  Pt. Visit Completed   Jone Baseman, RN, MSN [facility] Care Management Coordinator Direct Line [phone]

## 2022-09-06 NOTE — Patient Instructions (Signed)
Visit Information  Thank you for taking time to visit with me today. Please don't hesitate to contact me if I can be of assistance to you.   Following are the goals we discussed today:   Goals Addressed             This Visit's Progress    COMPLETED: Care Coordination Activities-No follow up required       Care Coordination Interventions: Advised patient to   schedule annual wellness visit.  Discussed [facility] services and support.  Patient declined and wants to visit physician.          If you are experiencing a Mental Health or [place] or need someone to talk to, please call the Suicide and Crisis Lifeline: 988   Patient verbalizes understanding of instructions and care plan provided today and agrees to view in [place]. Active MyChart status and patient understanding of how to access instructions and care plan via MyChart confirmed with patient.     No further follow up required: declined  Jone Baseman, RN, MSN [place] [facility] Care Management Coordinator Direct Line [phone]

## 2022-09-10 ENCOUNTER — Ambulatory Visit (INDEPENDENT_AMBULATORY_CARE_PROVIDER_SITE_OTHER): Payer: BC Managed Care – PPO

## 2022-09-10 ENCOUNTER — Ambulatory Visit (INDEPENDENT_AMBULATORY_CARE_PROVIDER_SITE_OTHER): Payer: BC Managed Care – PPO | Admitting: Podiatry

## 2022-09-10 ENCOUNTER — Ambulatory Visit: Payer: BC Managed Care – PPO

## 2022-09-10 DIAGNOSIS — M722 Plantar fascial fibromatosis: Secondary | ICD-10-CM | POA: Diagnosis not present

## 2022-09-10 DIAGNOSIS — R52 Pain, unspecified: Secondary | ICD-10-CM

## 2022-09-11 NOTE — Progress Notes (Signed)
  Subjective:  Patient ID: [name], female    DOB: 11-Nov-1965,  MRN: [id number]  Chief Complaint  Patient presents with   Foot Pain    Right heel pain    56 y.o. female presents with the above complaint. History confirmed with patient.  This is a new issue for her she has had sharp pain on the bottom of the right heel ongoing for the last few weeks and is worsening denies any injury or trauma  Objective:  Physical Exam: warm, good capillary refill, no trophic changes or ulcerative lesions, normal DP and PT pulses, and normal sensory exam. Left Foot: normal exam, no swelling, tenderness, instability; ligaments intact, full range of motion of all ankle/foot joints Right Foot: point tenderness over the heel pad  No images are attached to the encounter.  Radiographs: Multiple views x-ray of the right foot: no fracture, dislocation, swelling or degenerative changes noted and plantar calcaneal spur Assessment:   1. Plantar fasciitis of right foot   2. New complaint of pain      Plan:  Patient was evaluated and treated and all questions answered.  Discussed the etiology and treatment options for plantar fasciitis including stretching, formal physical therapy, supportive shoegears such as a running shoe or sneaker, pre fabricated orthoses, injection therapy, and oral medications. We also discussed the role of surgical treatment of this for patients who do not improve after exhausting non-surgical treatment options.   -XR reviewed with patient -Educated patient on stretching and icing of the affected limb -Plantar fascial brace dispensed to offload the medial longitudinal arch and strain of the plantar fascia medial band -Injection delivered to the plantar fascia of the right foot. -Long-term may want to consider custom molded orthoses and we will look at casting her for these at the next visit  No follow-ups on file.

## 2022-09-20 ENCOUNTER — Ambulatory Visit: Payer: BC Managed Care – PPO | Admitting: Family Medicine

## 2022-09-26 ENCOUNTER — Other Ambulatory Visit: Payer: Self-pay | Admitting: Family Medicine

## 2022-09-26 ENCOUNTER — Other Ambulatory Visit: Payer: Self-pay

## 2022-09-26 ENCOUNTER — Telehealth: Payer: Self-pay

## 2022-09-26 DIAGNOSIS — E1140 Type 2 diabetes mellitus with diabetic neuropathy, unspecified: Secondary | ICD-10-CM

## 2022-09-26 NOTE — Telephone Encounter (Signed)
Patient calls nurse line requesting an alternative to Ozempic.   There is a nation wide back order on Ozempic.   Will forward to PCP for alternative.

## 2022-10-03 ENCOUNTER — Other Ambulatory Visit: Payer: Self-pay | Admitting: Family Medicine

## 2022-10-03 DIAGNOSIS — E1140 Type 2 diabetes mellitus with diabetic neuropathy, unspecified: Secondary | ICD-10-CM

## 2022-10-04 ENCOUNTER — Ambulatory Visit (INDEPENDENT_AMBULATORY_CARE_PROVIDER_SITE_OTHER): Payer: BC Managed Care – PPO | Admitting: Family Medicine

## 2022-10-04 ENCOUNTER — Encounter: Payer: Self-pay | Admitting: Family Medicine

## 2022-10-04 VITALS — BP 187/80 | HR 79 | Wt 248.0 lb

## 2022-10-04 DIAGNOSIS — Z1211 Encounter for screening for malignant neoplasm of colon: Secondary | ICD-10-CM

## 2022-10-04 DIAGNOSIS — Z1212 Encounter for screening for malignant neoplasm of rectum: Secondary | ICD-10-CM

## 2022-10-04 DIAGNOSIS — I252 Old myocardial infarction: Secondary | ICD-10-CM

## 2022-10-04 DIAGNOSIS — G4733 Obstructive sleep apnea (adult) (pediatric): Secondary | ICD-10-CM

## 2022-10-04 DIAGNOSIS — E1159 Type 2 diabetes mellitus with other circulatory complications: Secondary | ICD-10-CM | POA: Diagnosis not present

## 2022-10-04 DIAGNOSIS — I2510 Atherosclerotic heart disease of native coronary artery without angina pectoris: Secondary | ICD-10-CM

## 2022-10-04 DIAGNOSIS — E1169 Type 2 diabetes mellitus with other specified complication: Secondary | ICD-10-CM

## 2022-10-04 DIAGNOSIS — E785 Hyperlipidemia, unspecified: Secondary | ICD-10-CM

## 2022-10-04 DIAGNOSIS — Z6841 Body Mass Index (BMI) 40.0 and over, adult: Secondary | ICD-10-CM

## 2022-10-04 DIAGNOSIS — R0683 Snoring: Secondary | ICD-10-CM

## 2022-10-04 DIAGNOSIS — E1140 Type 2 diabetes mellitus with diabetic neuropathy, unspecified: Secondary | ICD-10-CM

## 2022-10-04 DIAGNOSIS — I152 Hypertension secondary to endocrine disorders: Secondary | ICD-10-CM

## 2022-10-04 LAB — POCT GLYCOSYLATED HEMOGLOBIN (HGB A1C): HbA1c, POC (controlled diabetic range): 6.7 % (ref 0.0–7.0)

## 2022-10-04 MED ORDER — DAPAGLIFLOZIN PROPANEDIOL 10 MG PO TABS: 10.0000 mg | ORAL_TABLET | Freq: Every day | ORAL | 3 refills | Status: DC

## 2022-10-04 NOTE — Patient Instructions (Addendum)
We are looking into which GLP-1 injection will be covered by your insurance.  Once we know Dr Kylani Wires with the medication and how to use it.    We are starting a medicine called Marcelline Deist, it helps you pee out sugar, it protects your kidneys, and is associated with some weight loss as well.   Referrals were made to the eye doctor, the Sleep Medicine Specials[facility] (at [facility]), and to [facility] cardiology to have a specialist following your stents and coronary arteries and to [facility] gastroenterology for colon caner screening with a colonoscopy.     Make sure to check out at the front desk before you leave.  If you had blood tests today, Dr Shakeitha Umbaugh will send you a MyChart message or a letter if the results are normal.  Otherwise, he will give you a call.   If Dr Wania Longstreth ordered you to have a referral, you should hear from the referral's office to schedule an appointment.    If you have not heard from the referral's office within 5 business-days, please let Dr Abrey Bradway know by sending him a message through MyChart, or calling the [facility] Office [phone]) to leave him a message.

## 2022-10-05 ENCOUNTER — Encounter: Payer: Self-pay | Admitting: Family Medicine

## 2022-10-05 ENCOUNTER — Other Ambulatory Visit (HOSPITAL_COMMUNITY): Payer: Self-pay

## 2022-10-05 DIAGNOSIS — R0683 Snoring: Secondary | ICD-10-CM | POA: Insufficient documentation

## 2022-10-05 NOTE — Progress Notes (Signed)
[name] is alone Sources of clinical information for visit is/are patient. Nursing assessment for this office visit was reviewed with the patient for accuracy and revision.     Previous Report(s) Reviewed: none     10/04/2022    2:33 PM  Depression screen PHQ 2/9  Decreased Interest 3  Down, Depressed, Hopeless 1  PHQ - 2 Score 4  Altered sleeping 3  Tired, decreased energy 3  Change in appetite 3  Feeling bad or failure about yourself  3  Trouble concentrating 1  Moving slowly or fidgety/restless 0  Suicidal thoughts 0  PHQ-9 Score 17   Flowsheet Row Office Visit from 10/04/2022 in [facility] Office Visit from 06/01/2022 in [facility] Office Visit from 04/17/2022 in [facility]  Thoughts that you would be better off dead, or of hurting yourself in some way Not at all -- Several days  PHQ-9 Total Score 17 12 7           06/15/2022    2:37 PM 07/21/2020    2:55 PM 04/24/2016    3:04 PM 04/27/2014    3:52 PM  Fall Risk   Falls in the past year? 0 0 Yes No  Number falls in past yr: 0  1   Injury with Fall? 0  Yes   Risk Factor Category    High Fall Risk   Risk for fall due to :   History of fall(s);Other (Comment)   Risk for fall due to: Comment   Lt knee buckles, bilateral diabetic foot pain   Follow up   Falls evaluation completed        10/04/2022    2:33 PM 09/06/2022    9:59 AM 06/01/2022    2:35 PM  PHQ9 SCORE ONLY  PHQ-9 Total Score 17 1 12     There are no preventive care reminders to display for this patient.  Health Maintenance Due  Topic Date Due   Hepatitis C Screening  Never done   Zoster Vaccines- Shingrix (1 of 2) Never done   PAP SMEAR-Modifier  Never done   COLONOSCOPY (Pts 45-42yrs Insurance coverage will need to be confirmed)  Never done   OPHTHALMOLOGY EXAM  11/23/2014   MAMMOGRAM  Never done   COVID-19 Vaccine (3 - Pfizer risk series) 09/08/2020   Diabetic kidney  evaluation - Urine ACR  03/10/2021   INFLUENZA VACCINE  06/05/2022      History/P.E. limitations: none  There are no preventive care reminders to display for this patient.  Diabetes Health Maintenance Due  Topic Date Due   OPHTHALMOLOGY EXAM  11/23/2014   HEMOGLOBIN A1C  04/04/2023   FOOT EXAM  04/18/2023    Health Maintenance Due  Topic Date Due   Hepatitis C Screening  Never done   Zoster Vaccines- Shingrix (1 of 2) Never done   PAP SMEAR-Modifier  Never done   COLONOSCOPY (Pts 45-15yrs Insurance coverage will need to be confirmed)  Never done   OPHTHALMOLOGY EXAM  11/23/2014   MAMMOGRAM  Never done   COVID-19 Vaccine (3 - Pfizer risk series) 09/08/2020   Diabetic kidney evaluation - Urine ACR  03/10/2021   INFLUENZA VACCINE  06/05/2022     Chief Complaint  Patient presents with   Follow-up     --------------------------------------------------------------------------------------------------------------------------------------------- Visit Problem List with A/P  Type 2 diabetes mellitus with other circulatory complications (HCC) Lab Results  Component Value Date   HGBA1C 6.7  10/04/2022  Established problem Good glycemic control  Recently ran out of semaglutide, has not been available at pharmacy.  In past, when attempting to restart semaglutide at higher doses, she has felt ill. Will ask pharmacy to help determine which GLP-1 is covered by her insurance.  Start Farxiga 10 mg daily for renal protection. Discussed risk of urinary frequency, utis and yeast infections.   Referral to ophthalmology for diabetic eye exam.   Painful diabetic neuropathy (HCC) Established problem Well Controlled and is at goal of adequate pain control. Tolerating gabapentin at 1200 mg three times a day without grogginess.  No signs of complications, medication side effects, or red flags. Continue current medications and other regiments.   Morbid (severe) obesity due to excess calories  (HCC) Established problem worsened.  Wt Readings from Last 3 Encounters:  10/04/22 248 lb (112.5 kg)  06/15/22 238 lb 9.6 oz (108.2 kg)  06/01/22 236 lb 12.8 oz (107.4 kg)   Will restart GLP-1 if insurance covers. Need to discuss [place] [facility] referral at folow up.   Hypertension associated with diabetes (HCC) Vitals:   10/04/22 1436  BP: (!) 187/80  Pulse: 79  SpO2: 99%   Adding Farxiga. Continue Amlodipine If inadequate control persists, add ARB.  RTC in few weeks   Hyperlipidemia associated with type 2 diabetes mellitus (HCC) Established problem Cotninue atorvastatin Check lipid panel when rtc   OSA on CPAP Established problem Uncontrolled No longer has her CPAP device, it was given to her Grandfather. She believes the CPAP device was dispensed in 2018.    Referral to Sleep Specialist physician at Dr Vic[facility])    CAD, multiple vessel, S/P RCA DES Patient requesting referral to Cardiology to establish relationship given her history MI with stent placement.  She has been seen by [facility] in past.  Referral back to them sent.

## 2022-10-05 NOTE — Assessment & Plan Note (Signed)
Established problem Uncontrolled No longer has her CPAP device, it was given to her Grandfather. She believes the CPAP device was dispensed in 2018.    Referral to Sleep Specialist physician at Dr Vickey Hug[facility])

## 2022-10-05 NOTE — Assessment & Plan Note (Signed)
Patient requesting referral to Cardiology to establish relationship given her history MI with stent placement.  She has been seen by [facility] in past.  Referral back to them sent.

## 2022-10-05 NOTE — Assessment & Plan Note (Signed)
Established problem worsened.  Wt Readings from Last 3 Encounters:  10/04/22 248 lb (112.5 kg)  06/15/22 238 lb 9.6 oz (108.2 kg)  06/01/22 236 lb 12.8 oz (107.4 kg)   Will restart GLP-1 if insurance covers. Need to discuss [place] [facility] referral at folow up.

## 2022-10-05 NOTE — Assessment & Plan Note (Signed)
Established problem Well Controlled and is at goal of adequate pain control. Tolerating gabapentin at 1200 mg three times a day without grogginess.  No signs of complications, medication side effects, or red flags. Continue current medications and other regiments.

## 2022-10-05 NOTE — Assessment & Plan Note (Addendum)
Vitals:   10/04/22 1436  BP: (!) 187/80  Pulse: 79  SpO2: 99%   Adding Farxiga. Continue Amlodipine If inadequate control persists, add ARB.  RTC in few weeks

## 2022-10-05 NOTE — Assessment & Plan Note (Addendum)
Lab Results  Component Value Date   HGBA1C 6.7 10/04/2022  Established problem Good glycemic control  Recently ran out of semaglutide, has not been available at pharmacy.  In past, when attempting to restart semaglutide at higher doses, she has felt ill. Will ask pharmacy to help determine which GLP-1 is covered by her insurance.  Start Farxiga 10 mg daily for renal protection. Discussed risk of urinary frequency, utis and yeast infections.   Referral to ophthalmology for diabetic eye exam.

## 2022-10-05 NOTE — Assessment & Plan Note (Signed)
Established problem Cotninue atorvastatin Check lipid panel when rtc

## 2022-10-07 LAB — MICROALBUMIN / CREATININE URINE RATIO
Creatinine, Urine: 89.3 mg/dL
Microalb/Creat Ratio: 23 mg/g creat (ref 0–29)
Microalbumin, Urine: 20.2 ug/mL

## 2022-10-07 LAB — BASIC METABOLIC PANEL
BUN/Creatinine Ratio: 18 (ref 9–23)
BUN: 13 mg/dL (ref 6–24)
CO2: 24 mmol/L (ref 20–29)
Calcium: 9.6 mg/dL (ref 8.7–10.2)
Chloride: 102 mmol/L (ref 96–106)
Creatinine, Ser: 0.72 mg/dL (ref 0.57–1.00)
Glucose: 225 mg/dL — ABNORMAL HIGH (ref 70–99)
Potassium: 4.3 mmol/L (ref 3.5–5.2)
Sodium: 142 mmol/L (ref 134–144)
eGFR: 99 mL/min/{1.73_m2} (ref >59)

## 2022-10-08 ENCOUNTER — Other Ambulatory Visit: Payer: Self-pay | Admitting: Family Medicine

## 2022-10-08 ENCOUNTER — Encounter: Payer: Self-pay | Admitting: Family Medicine

## 2022-10-08 DIAGNOSIS — E1159 Type 2 diabetes mellitus with other circulatory complications: Secondary | ICD-10-CM

## 2022-10-08 DIAGNOSIS — Z6841 Body Mass Index (BMI) 40.0 and over, adult: Secondary | ICD-10-CM

## 2022-10-08 MED ORDER — SEMAGLUTIDE(0.25 OR 0.5MG/DOS) 2 MG/1.5ML ~~LOC~~ SOPN: 0.2500 mg | PEN_INJECTOR | SUBCUTANEOUS | 0 refills | Status: DC

## 2022-10-10 ENCOUNTER — Telehealth: Payer: Self-pay

## 2022-10-10 NOTE — Telephone Encounter (Signed)
Patient calls nurse line requesting to speak with PCP.   She reports she is unable to get Ozempic due to the national shortage.   She reports she spoke with PCP at recent office visit about doing a daily injection. She reports he was going to check with pharmacy for coverage.   Will forward to PCP.

## 2022-10-11 ENCOUNTER — Ambulatory Visit (INDEPENDENT_AMBULATORY_CARE_PROVIDER_SITE_OTHER): Payer: BC Managed Care – PPO | Admitting: Podiatry

## 2022-10-11 ENCOUNTER — Other Ambulatory Visit: Payer: Self-pay | Admitting: Family Medicine

## 2022-10-11 VITALS — BP 211/72 | HR 141

## 2022-10-11 DIAGNOSIS — M722 Plantar fascial fibromatosis: Secondary | ICD-10-CM

## 2022-10-11 DIAGNOSIS — E1159 Type 2 diabetes mellitus with other circulatory complications: Secondary | ICD-10-CM

## 2022-10-12 ENCOUNTER — Encounter: Payer: Self-pay | Admitting: Family Medicine

## 2022-10-12 NOTE — Progress Notes (Shared)
[facility] Note  10/23/2022     CHIEF COMPLAINT Patient presents for No chief complaint on file.   HISTORY OF PRESENT ILLNESS: [name] is a 56 y.o. female who presents to the clinic today for:     Referring physician: McDiarmid, Blane Ohara, MD [address],  [place] [address]  HISTORICAL INFORMATION:   Selected notes from the MEDICAL RECORD NUMBER Referred by Dr. Truman Hayward:  Ocular Hx- PMH-    CURRENT MEDICATIONS: No current outpatient medications on file. (Ophthalmic Drugs)   No current facility-administered medications for this visit. (Ophthalmic Drugs)   Current Outpatient Medications (Other)  Medication Sig   ACCU-CHEK GUIDE test strip USE 1  TO CHECK GLUCOSE THREE TIMES DAILY   Accu-Chek Softclix Lancets lancets Use once to check glucose daily   amLODipine (NORVASC) 10 MG tablet Take 1 tablet (10 mg total) by mouth daily.   atorvastatin (LIPITOR) 40 MG tablet Take 1 tablet (40 mg total) by mouth daily.   blood glucose meter kit and supplies KIT Check sugars three times a day   Blood Glucose Monitoring Suppl (CONTOUR BLOOD GLUCOSE SYSTEM) DEVI 1 Device by Does not apply route 3 (three) times daily.   dapagliflozin propanediol (FARXIGA) 10 MG TABS tablet Take 1 tablet (10 mg total) by mouth daily.   gabapentin (NEURONTIN) 600 MG tablet TAKE 2 TABLETS BY MOUTH THREE TIMES DAILY . APPOINTMENT REQUIRED FOR FUTURE REFILLS   INSULIN SYRINGE 1CC/29G 29G X 1/2" 1 ML MISC Use to inject insulin daily.   Semaglutide,0.25 or 0.5MG/DOS, 2 MG/1.5ML SOPN Inject 0.25 mg into the skin once a week. 0.25 mg once weekly for 4 weeks then increase to 0.5 mg weekly for at least 4 weeks,max 1 mg   No current facility-administered medications for this visit. (Other)      REVIEW OF SYSTEMS:    ALLERGIES Allergies  Allergen Reactions   Ace Inhibitors Swelling    Subglottic edema requiring intubation for respiratory failure    PAST  MEDICAL HISTORY Past Medical History:  Diagnosis Date   Abnormal bruising 06/18/2016   Acute osteomyelitis of phalanx of foot, left ([place]) 07/01/2020   MRI 07/01/20: osteomyelitis of left foot, 3rd digit, distal phalanx. MRI ordered by [facility].    Acute osteomyelitis of toe, right (HCC)    Allergy to ACE inhibitors 01/16/2019   subglotal angioedema requiring intubation   ARF (acute respiratory failure) ([place]) 02/10/2016   CAD (coronary artery disease) 02/10/2016   a. 03/14/2016 Staged PCI with DES to mid RCA. b. NSTEMI 02/2016: DES to mid-LAD, POBA to 1st Diag, 85% stenosis of RCA noted with staged PCI recommended   Chronic combined systolic (congestive) and diastolic (congestive) heart failure ([place])    a. Echo 02/2016: EF 40-45% w/ Grade 2 DD. 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   Constipation 03/07/2016   Corn of toe 04/17/2018   Harriet Pho disease (radial styloid tenosynovitis) 09/09/2020   Diabetic ulcer of toe of right foot associated with diabetes mellitus due to underlying condition, limited to breakdown of skin ([place]) 05/15/2019   Added automatically from request for surgery [id number]   Diabetic ulcer of toe of right foot associated with type 2 diabetes mellitus, with necrosis of bone ([place])    Dysphagia 03/09/2016   Referred to GI [place] 03/09/2016     Dyspnea 03/09/2016   Off ACEi since 02/19/16 -Spirometry 03/09/2016  No airflow obst  Fall 03/01/2016   Flash pulmonary edema ([place]) 02/10/2016   requiring intubation   GERD (gastroesophageal reflux disease)    History of MI (myocardial infarction) 02/04/2016   History of MRSA infection 11/05/2010   Skin abscesses   History of myocardial infarction 02/04/2016   Hypertension    Hypokalemia 03/08/2017   Leukocytosis 05/21/2016   Menopausal vasomotor syndrome 01/16/2019   Meralgia paresthetica of right side 12/18/2013   Microcytic anemia 05/18/2016   Myocardial infarction ([place]) 02/2016    NSTEMI (non-ST elevated myocardial infarction) ([place])    Obesity (BMI 30-39.9) 04/03/2016   Orthostatic hypotension 04/27/2014   OSA (obstructive sleep apnea)    OSA on CPAP    not using a cpap at this time   Osteomyelitis of fourth toe of right foot ([place]) 05/15/2019   Bone culture (05/21/19): MSSA and Streptococcus agalactiae. Patient's antibiotic oral therapy switched from Clindamycin to Augmentin 05/24/19 by Dr March Rummage (Podiatry) bc MSSA organism resistant to Clindamycin. Added automatically from request for surgery [id number]   Pneumonia    Postmenopausal 01/16/2019   Required emergent intubation 02/10/2016   S/P drug eluting coronary stent placement 07/14/2021   Stridor 02/19/2016   Type II diabetes mellitus [facility])    Past Surgical History:  Procedure Laterality Date   AMPUTATION Left 07/15/2020   Procedure: AMPUTATION PARTIAL LEFT 3RD TOE;  Surgeon: Criselda Peaches, DPM;  Location: [place];  Service: Podiatry;  Laterality: Left;   ANTERIOR CERVICAL DECOMP/DISCECTOMY FUSION  08/28/2012   Procedure: ANTERIOR CERVICAL DECOMPRESSION/DISCECTOMY FUSION 2 LEVELS;  Surgeon: Otilio Connors, MD;  Location: [place] NEURO ORS;  Service: Neurosurgery;  Laterality: N/A;  Cervical four-five, cervical six-seven Anterior cervical decompression/diskectomy/fusion/LifeNet Bone/Trestle plate   BACK SURGERY     BONE BIOPSY Right 05/21/2019   Procedure: Bone Biopsy Middle Phalanx, Bone Biospy Proximal Phalanx with placement of antbiotic delivery device;  Surgeon: Evelina Bucy, DPM;  Location: [place];  Service: Podiatry;  Laterality: Right;   BREAST CYST EXCISION Right    CARDIAC CATHETERIZATION N/A 02/15/2016   Procedure: Left Heart Cath and Coronary Angiography;  Surgeon: Wellington Hampshire, MD;  Location: [place] CV LAB;  Service: Cardiovascular;  Laterality: N/A;   CARDIAC CATHETERIZATION N/A 02/15/2016   Procedure: Coronary Stent Intervention;  Surgeon: Wellington Hampshire, MD;  Location: [place] CV LAB;   Service: Cardiovascular;  Laterality: N/A;   CARDIAC CATHETERIZATION N/A 02/15/2016   Procedure: Coronary Balloon Angioplasty;  Surgeon: Wellington Hampshire, MD;  Location: [place] CV LAB;  Service: Cardiovascular;  Laterality: N/A;   CARDIAC CATHETERIZATION N/A 03/14/2016   Procedure: Coronary Stent Intervention;  Surgeon: Wellington Hampshire, MD;  Location: [place] CV LAB;  Service: Cardiovascular;  Laterality: N/A;   CORONARY ANGIOPLASTY WITH STENT PLACEMENT     IRRIGATION AND DEBRIDEMENT FOOT Right 05/21/2019   Procedure: Right fourth toe debridement and removal of infected bone;  Surgeon: Evelina Bucy, DPM;  Location: [place];  Service: Podiatry;  Laterality: Right;   SPLIT NIGHT STUDY  04/07/2016    FAMILY HISTORY Family History  Problem Relation Age of Onset   Heart disease Father 96       MI   Heart disease Paternal Grandmother 54   Diabetes Paternal Grandmother    Hypertension Paternal Grandmother    Heart disease Paternal Aunt    Cancer Neg Hx    Stroke Neg Hx    Alcohol abuse Neg Hx     SOCIAL HISTORY Social History   Tobacco Use  Smoking status: Never   Smokeless tobacco: Never  Vaping Use   Vaping Use: Never used  Substance Use Topics   Alcohol use: Not Currently   Drug use: Yes    Types: Marijuana         OPHTHALMIC EXAM:  Not recorded     IMAGING AND PROCEDURES  Imaging and Procedures for 10/23/2022           ASSESSMENT/PLAN:  No diagnosis found.  1.  2.  3.  Ophthalmic Meds Ordered this visit:  No orders of the defined types were placed in this encounter.      No follow-ups on file.  There are no Patient Instructions on file for this visit.   Explained the diagnoses, plan, and follow up with the patient and they expressed understanding.  Patient expressed understanding of the importance of proper follow up care.   This document serves as a record of services personally performed by Gardiner Sleeper, MD, PhD. It was created  on their behalf by Renaldo Reel, [place] an ophthalmic technician. The creation of this record is the provider's dictation and/or activities during the visit.    Electronically signed by:  Renaldo Reel, COT 12.08.23 2:01 PM   Gardiner Sleeper, M.D., Ph.D. Diseases & Surgery of the Retina and Vitreous [facility] _0 @     Abbreviations: M myopia (nearsighted); A astigmatism; H hyperopia (farsighted); P presbyopia; Mrx spectacle prescription;  CTL contact lenses; OD right eye; OS left eye; OU both eyes  XT exotropia; ET esotropia; PEK punctate epithelial keratitis; PEE punctate epithelial erosions; DES dry eye syndrome; MGD meibomian gland dysfunction; ATs artificial tears; PFAT's preservative free artificial tears; [place] nuclear sclerotic cataract; PSC posterior subcapsular cataract; ERM epi-retinal membrane; PVD posterior vitreous detachment; RD retinal detachment; DM diabetes mellitus; DR diabetic retinopathy; NPDR non-proliferative diabetic retinopathy; PDR proliferative diabetic retinopathy; CSME clinically significant macular edema; DME diabetic macular edema; dbh dot blot hemorrhages; CWS cotton wool spot; POAG primary open angle glaucoma; C/D cup-to-disc ratio; HVF humphrey visual field; GVF goldmann visual field; OCT optical coherence tomography; IOP intraocular pressure; BRVO Branch retinal vein occlusion; CRVO central retinal vein occlusion; CRAO central retinal artery occlusion; BRAO branch retinal artery occlusion; RT retinal tear; SB scleral buckle; PPV pars plana vitrectomy; VH Vitreous hemorrhage; PRP panretinal laser photocoagulation; IVK intravitreal kenalog; VMT vitreomacular traction; MH Macular hole;  NVD neovascularization of the disc; NVE neovascularization elsewhere; AREDS age related eye disease study; ARMD age related macular degeneration; POAG primary open angle glaucoma; EBMD epithelial/anterior basement membrane dystrophy; ACIOL anterior chamber  intraocular lens; IOL intraocular lens; PCIOL posterior chamber intraocular lens; Phaco/IOL phacoemulsification with intraocular lens placement; [place] photorefractive keratectomy; LASIK laser assisted in situ keratomileusis; HTN hypertension; DM diabetes mellitus; COPD chronic obstructive pulmonary disease

## 2022-10-14 NOTE — Progress Notes (Signed)
  Subjective:  Patient ID: [name], female    DOB: [birth year],  MRN: [id number]  Chief Complaint  Patient presents with   Plantar Fasciitis    Follow up right foot - her blood pressure is elevated today, she states that her medicine s at the pharmacy, she just hasn't picked it up    56 y.o. female presents with the above complaint. History confirmed with patient.  Has been doing okay, the pain is still persistent, the injection helped for a little bit  Objective:  Physical Exam: warm, good capillary refill, no trophic changes or ulcerative lesions, normal DP and PT pulses, and normal sensory exam. Left Foot: normal exam, no swelling, tenderness, instability; ligaments intact, full range of motion of all ankle/foot joints Right Foot: point tenderness over the heel pad  No images are attached to the encounter.  Radiographs: Multiple views x-ray of the right foot: no fracture, dislocation, swelling or degenerative changes noted and plantar calcaneal spur Assessment:   1. Plantar fasciitis of right foot      Plan:  Patient was evaluated and treated and all questions answered.  Discussed the etiology and treatment options for plantar fasciitis including stretching, formal physical therapy, supportive shoegears such as a running shoe or sneaker, pre fabricated orthoses, injection therapy, and oral medications. We also discussed the role of surgical treatment of this for patients who do not improve after exhausting non-surgical treatment options.   So far has not had much improvement.  We discussed formal physical therapy.  Also discussed shoe gear and offloading with cushioning.  She will get a Tuli's heel cup.  Continue home therapy.  If not improving next few weeks would repeat injection  No follow-ups on file.

## 2022-10-15 ENCOUNTER — Other Ambulatory Visit (HOSPITAL_COMMUNITY): Payer: Self-pay

## 2022-10-16 ENCOUNTER — Encounter: Payer: Self-pay | Admitting: Family Medicine

## 2022-10-23 ENCOUNTER — Encounter (INDEPENDENT_AMBULATORY_CARE_PROVIDER_SITE_OTHER): Payer: Self-pay

## 2022-10-23 ENCOUNTER — Encounter (INDEPENDENT_AMBULATORY_CARE_PROVIDER_SITE_OTHER): Payer: BC Managed Care – PPO | Admitting: Ophthalmology

## 2022-10-23 DIAGNOSIS — H3581 Retinal edema: Secondary | ICD-10-CM

## 2022-11-08 ENCOUNTER — Other Ambulatory Visit: Payer: Self-pay | Admitting: Family Medicine

## 2022-11-29 ENCOUNTER — Ambulatory Visit (INDEPENDENT_AMBULATORY_CARE_PROVIDER_SITE_OTHER): Payer: BC Managed Care – PPO | Admitting: Podiatry

## 2022-11-29 DIAGNOSIS — M722 Plantar fascial fibromatosis: Secondary | ICD-10-CM | POA: Diagnosis not present

## 2022-11-29 NOTE — Patient Instructions (Signed)
Look for Voltaren gel at the pharmacy over the counter or online (also known as diclofenac 1% gel). Apply to the painful areas 3-4x daily with the supplied dosing card. Allow to dry for 10 minutes before going into socks/shoes  

## 2022-12-03 ENCOUNTER — Encounter: Payer: Self-pay | Admitting: Podiatry

## 2022-12-03 NOTE — Progress Notes (Signed)
  Subjective:  Patient ID: [name], female    DOB: [birth year],  MRN: [id number]  Chief Complaint  Patient presents with   Plantar Fasciitis    Right foot plantar fasciitis follow-up, patient is still having pain, the injection made the pain worse,     57 y.o. female presents with the above complaint. History confirmed with patient.  She is still having persistent pain  Objective:  Physical Exam: warm, good capillary refill, no trophic changes or ulcerative lesions, normal DP and PT pulses, and normal sensory exam. Left Foot: normal exam, no swelling, tenderness, instability; ligaments intact, full range of motion of all ankle/foot joints Right Foot: point tenderness over the heel pad  No images are attached to the encounter.  Radiographs: Multiple views x-ray of the right foot: no fracture, dislocation, swelling or degenerative changes noted and plantar calcaneal spur Assessment:   1. Plantar fasciitis of right foot      Plan:  Patient was evaluated and treated and all questions answered.  Tuli's heel cup was not helpful.  She does not want to proceed with more injections at this point.  I do think formal physical therapy would be beneficial.  She has issues scheduling this with her work restrictions.  Will see if [facility] was able to take her for their in-house therapy because this is closer to her office.  I will see her back after therapy if it does not improve  Return if symptoms worsen or fail to improve.

## 2022-12-05 ENCOUNTER — Other Ambulatory Visit: Payer: Self-pay

## 2022-12-05 DIAGNOSIS — M722 Plantar fascial fibromatosis: Secondary | ICD-10-CM

## 2022-12-05 NOTE — Progress Notes (Signed)
I know they take referrals for physical therapy

## 2022-12-05 NOTE — Progress Notes (Signed)
Referral has been put in for physical therapy at [facility]

## 2022-12-06 NOTE — Progress Notes (Signed)
Referral was sent.

## 2022-12-14 ENCOUNTER — Other Ambulatory Visit: Payer: Self-pay | Admitting: Family Medicine

## 2022-12-14 DIAGNOSIS — E1159 Type 2 diabetes mellitus with other circulatory complications: Secondary | ICD-10-CM

## 2022-12-17 MED ORDER — SEMAGLUTIDE (1 MG/DOSE) 4 MG/3ML ~~LOC~~ SOPN: 1.0000 mg | PEN_INJECTOR | SUBCUTANEOUS | 0 refills | Status: DC

## 2022-12-20 ENCOUNTER — Ambulatory Visit (INDEPENDENT_AMBULATORY_CARE_PROVIDER_SITE_OTHER): Payer: Self-pay | Admitting: Family Medicine

## 2022-12-20 DIAGNOSIS — E1159 Type 2 diabetes mellitus with other circulatory complications: Secondary | ICD-10-CM

## 2022-12-20 NOTE — Patient Instructions (Signed)
It was wonderful to see you today.  Please bring ALL of your medications with you to every visit.   Updates from today's visit:  Please check with your insurance once your card arrives about Ozempic coverage  Please follow up with Dr. Wendy Poet at your appointment on 2/22 at 2:30pm  Thank you for choosing [facility].   Please call [phone] with any questions about today's appointment.  Please be sure to schedule follow up at the front  desk before you leave today.   August Albino, MD  Family Medicine

## 2022-12-20 NOTE — Assessment & Plan Note (Addendum)
Has been unable to afford her Ozempic due to loss of insurance, but recently was approved for a new plan.  Discussed, patient is willing to wait and see if her new plan will cover the [place].  Given that she has only missed 2-3 doses with last A1c 6.7, I am comfortable with this slight lapse in her medication especially as she is still taking [place].  She has close follow-up scheduled with PCP next week to discuss further.  If her insurance continues to not cover Ozempic, may consider having her work with our pharmacy team

## 2022-12-20 NOTE — Progress Notes (Signed)
  SUBJECTIVE:   CHIEF COMPLAINT / HPI:   Was on Ozempic but lost her job and insurance last month, hasn't been able to afford it. Copay was over $700. Last dose 2-3 weeks ago  Was just approved for [employer], still waiting on card. Did not try for medicaid  Is currently in interview stage for new job.  PERTINENT  PMH / PSH: T2DM   OBJECTIVE:  LMP 01/13/2018 (LMP Unknown)   General: NAD, pleasant, able to participate in exam CV: RRR no MRG Respiratory: No respiratory distress, CTAB ABD: NT/ND, soft Skin: warm and dry, no rashes noted Psych: Normal affect and mood   ASSESSMENT/PLAN:   Type 2 diabetes mellitus with other circulatory complications [facility]) Assessment & Plan: Has been unable to afford her Ozempic due to loss of insurance, but recently was approved for a new plan.  Discussed, patient is willing to wait and see if her new plan will cover the [place].  Given that she has only missed 2-3 doses with last A1c 6.7, I am comfortable with this slight lapse in her medication especially as she is still taking [place].  She has close follow-up scheduled with PCP next week to discuss further.  If her insurance continues to not cover Ozempic, may consider having her work with our pharmacy team     Return in about 1 week (around 12/27/2022).  August Albino, MD [facility]

## 2022-12-27 ENCOUNTER — Ambulatory Visit (INDEPENDENT_AMBULATORY_CARE_PROVIDER_SITE_OTHER): Payer: Self-pay | Admitting: Family Medicine

## 2022-12-27 ENCOUNTER — Encounter: Payer: Self-pay | Admitting: Family Medicine

## 2022-12-27 VITALS — BP 148/57 | HR 64 | Wt 236.0 lb

## 2022-12-27 DIAGNOSIS — I10 Essential (primary) hypertension: Secondary | ICD-10-CM

## 2022-12-27 DIAGNOSIS — I214 Non-ST elevation (NSTEMI) myocardial infarction: Secondary | ICD-10-CM

## 2022-12-27 DIAGNOSIS — I2510 Atherosclerotic heart disease of native coronary artery without angina pectoris: Secondary | ICD-10-CM

## 2022-12-27 DIAGNOSIS — E785 Hyperlipidemia, unspecified: Secondary | ICD-10-CM

## 2022-12-27 DIAGNOSIS — E1159 Type 2 diabetes mellitus with other circulatory complications: Secondary | ICD-10-CM

## 2022-12-27 LAB — POCT GLYCOSYLATED HEMOGLOBIN (HGB A1C): HbA1c, POC (controlled diabetic range): 7.5 % — AB (ref 0.0–7.0)

## 2022-12-27 MED ORDER — AMLODIPINE BESYLATE 10 MG PO TABS: 10.0000 mg | ORAL_TABLET | Freq: Every day | ORAL | 3 refills | Status: DC

## 2022-12-27 MED ORDER — ATORVASTATIN CALCIUM 40 MG PO TABS: 40.0000 mg | ORAL_TABLET | Freq: Every day | ORAL | 1 refills | Status: DC

## 2022-12-27 NOTE — Patient Instructions (Signed)
Start the Mounjaro 2.5 mg weekly.  See you back in 4 weeks.

## 2022-12-28 ENCOUNTER — Encounter: Payer: Self-pay | Admitting: Family Medicine

## 2022-12-28 LAB — MICROALBUMIN / CREATININE URINE RATIO
Creatinine, Urine: 28.4 mg/dL
Microalb/Creat Ratio: 15 mg/g creat (ref 0–29)
Microalbumin, Urine: 4.3 ug/mL

## 2022-12-28 NOTE — Assessment & Plan Note (Addendum)
Established problem Lab Results  Component Value Date   HGBA1C 7.5 (A) 12/27/2022   Good degree of Control She does have some Farxiga tabs left. She does not have insulin. She has been out of her Ozempic for three weeks.  [place] sample Mounjaro 2.5 mg weekly dose for 4 weeks given to patient Patient is seeking coupon to helf with cost of Mounjaro She is actively seeking employment that will provide her insurance.

## 2022-12-28 NOTE — Progress Notes (Signed)
Rosi GINDY BILES is accompanied by her granddgt who is a minor.  Sources of clinical information for visit is/are patient. Nursing assessment for this office visit was reviewed with the patient for accuracy and revision.     Previous Report(s) Reviewed: none     12/27/2022    2:57 PM  Depression screen PHQ 2/9  Decreased Interest 1  Down, Depressed, Hopeless 1  PHQ - 2 Score 2  Altered sleeping 1  Tired, decreased energy 1  Change in appetite 1  Feeling bad or failure about yourself  1  Trouble concentrating 1  Moving slowly or fidgety/restless 0  Suicidal thoughts 0  PHQ-9 Score 7   Flowsheet Row Office Visit from 12/27/2022 in [place] Office Visit from 10/04/2022 in [place] Office Visit from 06/01/2022 in [place]  Thoughts that you would be better off dead, or of hurting yourself in some way Not at all Not at all --  PHQ-9 Total Score '7 17 12          '$ 06/15/2022    2:37 PM 07/21/2020    2:55 PM 04/24/2016    3:04 PM 04/27/2014    3:52 PM  [place] in the past year? 0 0 Yes No  Number falls in past yr: 0  1   Injury with Fall? 0  Yes   Risk Factor Category    High Fall Risk   Risk for fall due to :   History of fall(s);Other (Comment)   Risk for fall due to: Comment   Lt knee buckles, bilateral diabetic foot pain   Follow up   Falls evaluation completed        12/27/2022    2:57 PM 10/04/2022    2:33 PM 09/06/2022    9:59 AM  PHQ9 SCORE ONLY  PHQ-9 Total Score '7 17 1    '$ There are no preventive care reminders to display for this patient.  Health Maintenance Due  Topic Date Due   Hepatitis C Screening  Never done   Zoster Vaccines- Shingrix (1 of 2) Never done   PAP SMEAR-Modifier  Never done   COLONOSCOPY (Pts 45-27yr Insurance coverage will need to be confirmed)  Never done   OPHTHALMOLOGY EXAM  11/23/2014   MAMMOGRAM  Never done      History/P.E. limitations: none  There  are no preventive care reminders to display for this patient.  Diabetes Health Maintenance Due  Topic Date Due   OPHTHALMOLOGY EXAM  11/23/2014   FOOT EXAM  04/18/2023   HEMOGLOBIN A1C  06/27/2023    Health Maintenance Due  Topic Date Due   Hepatitis C Screening  Never done   Zoster Vaccines- Shingrix (1 of 2) Never done   PAP SMEAR-Modifier  Never done   COLONOSCOPY (Pts 45-411yrInsurance coverage will need to be confirmed)  Never done   OPHTHALMOLOGY EXAM  11/23/2014   MAMMOGRAM  Never done     No chief complaint on file.    --------------------------------------------------------------------------------------------------------------------------------------------- Visit Problem List with A/P  No problem-specific Assessment & Plan notes found for this encounter.

## 2023-01-08 ENCOUNTER — Ambulatory Visit (INDEPENDENT_AMBULATORY_CARE_PROVIDER_SITE_OTHER): Payer: Self-pay

## 2023-01-08 ENCOUNTER — Telehealth: Payer: Self-pay | Admitting: Family Medicine

## 2023-01-08 DIAGNOSIS — Z111 Encounter for screening for respiratory tuberculosis: Secondary | ICD-10-CM

## 2023-01-08 NOTE — Telephone Encounter (Signed)
patient dropped off form at front desk for Health Examination Certificate .  Verified that patient section of form has been completed.  Last DOS/WCC with PCP was 12/27/22.  Placed form in blue team folder to be completed by clinical staff.  Creig Hines

## 2023-01-08 NOTE — Progress Notes (Signed)
Patient is here for a PPD placement.  PPD placed in left forearm @ 9:25 am.  Patient will return 01/10/2023 to have PPD read. Talbot Grumbling, RN

## 2023-01-08 NOTE — Telephone Encounter (Signed)
From will be put back in [facility]. Salvatore Marvel, CMA

## 2023-01-08 NOTE — Telephone Encounter (Signed)
Attempted to reach patient to inform that. There is no shot for TB. That she can come in a have a blood draw to test for or Skin. No answer. LVM. Salvatore Marvel, CMA

## 2023-01-10 ENCOUNTER — Ambulatory Visit: Payer: Self-pay

## 2023-01-10 DIAGNOSIS — Z111 Encounter for screening for respiratory tuberculosis: Secondary | ICD-10-CM

## 2023-01-10 LAB — TB SKIN TEST
Induration: 0 mm
TB Skin Test: NEGATIVE

## 2023-01-10 NOTE — Progress Notes (Signed)
PPD Reading Note PPD read and results entered in EpicCare. Result: 0 mm induration. Interpretation: Negative Allergic reaction: No  

## 2023-01-10 NOTE — Telephone Encounter (Signed)
Patient presents to nurse clinic for PPD read.   Negative PPD test.   Patient became tearful requesting the paperwork she dropped off for her new employer. She reports she needs this as soon as possible.   Form is in PCP box.

## 2023-01-11 ENCOUNTER — Ambulatory Visit (INDEPENDENT_AMBULATORY_CARE_PROVIDER_SITE_OTHER): Payer: Self-pay | Admitting: Family Medicine

## 2023-01-11 ENCOUNTER — Other Ambulatory Visit: Payer: Self-pay | Admitting: Family Medicine

## 2023-01-11 ENCOUNTER — Other Ambulatory Visit: Payer: Self-pay

## 2023-01-11 VITALS — BP 136/59 | HR 72 | Ht 63.0 in | Wt 231.2 lb

## 2023-01-11 DIAGNOSIS — J0100 Acute maxillary sinusitis, unspecified: Secondary | ICD-10-CM

## 2023-01-11 DIAGNOSIS — Z0283 Encounter for blood-alcohol and blood-drug test: Secondary | ICD-10-CM

## 2023-01-11 DIAGNOSIS — I152 Hypertension secondary to endocrine disorders: Secondary | ICD-10-CM

## 2023-01-11 DIAGNOSIS — Z021 Encounter for pre-employment examination: Secondary | ICD-10-CM

## 2023-01-11 DIAGNOSIS — E1159 Type 2 diabetes mellitus with other circulatory complications: Secondary | ICD-10-CM

## 2023-01-11 MED ORDER — AMOXICILLIN-POT CLAVULANATE 875-125 MG PO TABS: 1.0000 | ORAL_TABLET | Freq: Two times a day (BID) | ORAL | 0 refills | Status: AC

## 2023-01-11 NOTE — Telephone Encounter (Signed)
Patient is returning a call from out office. She states she received a voicemail but is not able to understand it.   She is waiting to have paper work completed and was wanting to know if it was someone calling concerning paper work. I do not see anything documented about someone calling patient.   She would like whoever called to call her back.

## 2023-01-11 NOTE — Progress Notes (Unsigned)
    SUBJECTIVE:   CHIEF COMPLAINT / HPI:   Patient presents for sinus issues. She state she feels she has a sinus infection. Has a lot of tenderness of her sinuses. Some congestion. No fever. No cough. Has had sinus infection before. Requests antibiotics   BP elevated she thinks because hse is not feeling well.  Takes amlodipine 10mg , farxiga 10mg   Has not taken medication today  States she dropped off a form for her PCP on Tuesday and was hoping to have it completed before Monday so she can return to work. She is tearful because she states she has been out of work for a while (3 months). discussed that it can take 7 days to complete.   PERTINENT  PMH / PSH: Reviewed   OBJECTIVE:   BP (!) 136/59   Pulse 72   Ht 5\' 3"  (1.6 m)   Wt 231 lb 3.2 oz (104.9 kg)   LMP 01/13/2018 (LMP Unknown)   SpO2 98%   BMI 40.96 kg/m    General: alert, tearful, NAD HEENT: No nasal discharge observed. Significant tenderness to light palpation over maxillary sinuses. Normal oropharynx  CV: RRR no murmurs Resp: CTAB normal WOB GI: soft, non distended   Assessment and Plan  Sinus infection Patient presents with congestion and facial pain. Exam significant for maxillary tenderness. Will treat sinus infection with Aumgmentin BID for 7 days.   Hypertension associated with diabetes ([place]) BP 181/74 but on repeat check 136/59. Currently taking amlodipine 10mg , farxiga 10mg  though did not take today. BP may be elevated due to her pain and overall not feeling well. Will not make adjustments at this time and will continue to monitor.     [place]

## 2023-01-11 NOTE — Telephone Encounter (Signed)
Attempted to reach patient. LVM. To inform that she would have to have blood work done. In order to test for MMR,Varicella and Hep B antibodies. I also mentioned if patient has shot records she can bring that in so that we can updated her vaccine list. Labs is all she would have to do no need to see a doctor. Orders are in the system.  Salvatore Marvel, CMA

## 2023-01-11 NOTE — Telephone Encounter (Signed)
Physical form placed back in blue team folder. Salvatore Marvel, CMA

## 2023-01-11 NOTE — Patient Instructions (Signed)
It was great seeing you today!  We are treating your sinus infection with Augmentin twice a day for 7 days.  I will also let your PCP know about signing your paperwork.   Feel free to call with any questions or concerns at any time, at [phone].   Take care,  Dr. Shary Key [facility]

## 2023-01-13 DIAGNOSIS — J329 Chronic sinusitis, unspecified: Secondary | ICD-10-CM

## 2023-01-13 HISTORY — DX: Chronic sinusitis, unspecified: J32.9

## 2023-01-13 NOTE — Assessment & Plan Note (Signed)
BP 181/74 but on repeat check 136/59. Currently taking amlodipine '10mg'$ , farxiga '10mg'$  though did not take today. BP may be elevated due to her pain and overall not feeling well. Will not make adjustments at this time and will continue to monitor.

## 2023-01-13 NOTE — Assessment & Plan Note (Signed)
Patient presents with congestion and facial pain. Exam significant for maxillary tenderness. Will treat sinus infection with Aumgmentin BID for 7 days.

## 2023-01-14 ENCOUNTER — Other Ambulatory Visit: Payer: Self-pay

## 2023-01-14 DIAGNOSIS — Z0283 Encounter for blood-alcohol and blood-drug test: Secondary | ICD-10-CM

## 2023-01-14 NOTE — Telephone Encounter (Signed)
Spoke with patient today.She informed me that she had her blood work done today. I let her know that it should take 1-2 days to get blood work back, and that the see will be contacted once form is completed. Salvatore Marvel, CMA

## 2023-01-15 LAB — HEPATITIS B SURFACE ANTIGEN: Hepatitis B Surface Ag: NEGATIVE

## 2023-01-16 LAB — MEASLES/MUMPS/RUBELLA IMMUNITY
MUMPS ABS, IGG: >300 AU/mL (ref >10.9)
RUBEOLA AB, IGG: >300 AU/mL (ref >16.4)
Rubella Antibodies, IGG: 1.97 index (ref >0.99)

## 2023-01-16 LAB — VARICELLA ZOSTER ABS, IGG/IGM
Varicella IgM: <0.91 index (ref 0.00–0.90)
Varicella zoster IgG: 583 index (ref >165)

## 2023-01-16 LAB — HEPATITIS B SURFACE ANTIBODY,QUALITATIVE: Hep B Surface Ab, Qual: NONREACTIVE

## 2023-01-16 NOTE — Telephone Encounter (Signed)
Spoke with patient. Informed that her form is ready for pick up at the front desk. Salvatore Marvel, CMA

## 2023-01-21 ENCOUNTER — Encounter: Payer: Self-pay | Admitting: Student

## 2023-01-21 ENCOUNTER — Other Ambulatory Visit: Payer: Self-pay | Admitting: Family Medicine

## 2023-01-21 ENCOUNTER — Ambulatory Visit (INDEPENDENT_AMBULATORY_CARE_PROVIDER_SITE_OTHER): Payer: Self-pay | Admitting: Student

## 2023-01-21 ENCOUNTER — Other Ambulatory Visit: Payer: Self-pay

## 2023-01-21 VITALS — BP 150/66 | HR 70 | Ht 63.0 in | Wt 235.6 lb

## 2023-01-21 DIAGNOSIS — I152 Hypertension secondary to endocrine disorders: Secondary | ICD-10-CM

## 2023-01-21 DIAGNOSIS — E1140 Type 2 diabetes mellitus with diabetic neuropathy, unspecified: Secondary | ICD-10-CM

## 2023-01-21 DIAGNOSIS — R4589 Other symptoms and signs involving emotional state: Secondary | ICD-10-CM

## 2023-01-21 DIAGNOSIS — E1159 Type 2 diabetes mellitus with other circulatory complications: Secondary | ICD-10-CM

## 2023-01-21 MED ORDER — GABAPENTIN 600 MG PO TABS: ORAL_TABLET | ORAL | 0 refills | Status: DC

## 2023-01-21 MED ORDER — HYDROCHLOROTHIAZIDE 12.5 MG PO TABS: 12.5000 mg | ORAL_TABLET | Freq: Every day | ORAL | 0 refills | Status: DC

## 2023-01-21 NOTE — Assessment & Plan Note (Signed)
stable - Last A1c:  Lab Results  Component Value Date   HGBA1C 7.5 (A) 12/27/2022   - Medications: Mounjaro and Farxiga  - Compliance: Patient is having difficulty obtaining her medications secondary to social stressors and losing her job.  I will send a message to Rosendo Gros to see if there are any assistance programs.  Additionally have provided her with managed Medicaid information for her to apply.  I want the patient to return in 1 month to assess mood and medication follow-up.

## 2023-01-21 NOTE — Patient Instructions (Addendum)
It was great to see you today! Thank you for choosing [facility] for your primary care. [name] was seen for follow up.  Today we addressed: I will reach our technician to see if we have any programs for [facility]  I will send a refill for gabapentin  We will start HCTZ for blood pressure-12.5 mg a day for the next month  Return in 1 month to assess   Medicaid is expanding as of October 05, 2022.  To apply: Go online to [email or web address]  OR  Call your local Department of Social Services(DSS) and complete a telephone application. Phone: [phone] OR  Venida Jarvis out a paper application and either mail, fax, email or drop off the application to your local DSS office. Addresses are below  OR  Apply in person at your local Department of Social Services (DSS) office. Addresses are below   Where to [employer] your Application:   [address] [place], [place] [address]  [facility] [address] [place], [place] [address] ?  You should be eligible for Medicaid if:   You live in [place] are ages 75 through 44 You are a citizen (some non-US citizens can also get health care coverage through [facility] And if your household income falls within the chart below:   sehold size Total income, before taxes  Single Adults $1,676/month or less     ($20,120/year)  Family of 2 $2,267/month or less      ($27,214/year)  Family of 3 $2,859/month or less  ($34,307/year)  Family of 4 $3,450/month or less  ($41,400/year)  Each additional person add $591/month (add $7,094/year)   Information you will need:  Full legal name Date of birth Social Security number (or immigration documents) [place] residency Income information (from [place], W-2 forms, tax returns or business records)  If you haven't already, sign up for My Chart to have easy access to your labs results, and communication with your primary care physician.  I recommend that  you always bring your medications to each appointment as this makes it easy to ensure you are on the correct medications and helps [place] not miss refills when you need them. Call the clinic at [phone] if your symptoms worsen or you have any concerns.  You should return to our clinic Return in about 4 weeks (around 02/18/2023) for HTN, MOOD. Please arrive 15 minutes before your appointment to ensure smooth check in process.  We appreciate your efforts in making this happen.  Thank you for allowing me to participate in your care, Erskine Emery, MD 01/21/2023, 4:02 PM PGY-2, [place]

## 2023-01-21 NOTE — Assessment & Plan Note (Signed)
Elevated on 2 checks today.  Possibly related to stress of her situation.  Will continue with amlodipine 10 mg and adding HCTZ 12.5 mg.  Patient verbalized understanding, return in 1 month for assessment.

## 2023-01-21 NOTE — Progress Notes (Signed)
SUBJECTIVE:   CHIEF COMPLAINT / HPI:   Type 2 Diabetes: Home medications include: Farxiga 10 mg, Mounjaro 2.5 mg. Does endorse compliance.  Darcel Bayley is good, but the patient reports that she has had difficulty with paying for her medications because she lost her job recently.  She has one pen for the next week and is worried about the medications.   Most recent A1Cs:  Lab Results  Component Value Date   HGBA1C 7.5 (A) 12/27/2022   HGBA1C 6.7 10/04/2022   Last Microalbumin, LDL, Creatinine: Lab Results  Component Value Date   LDLCALC 58 01/15/2019   CREATININE 0.72 10/04/2022    Patient is not up to date on diabetic eye. Patient is up to date on diabetic foot exam.  Hypertension: BP: (!) 150/66 today. Home medications include: Amlodipine 10 mg. She endorses taking these medications as prescribed.   Most recent creatinine trend:  Lab Results  Component Value Date   CREATININE 0.72 10/04/2022   CREATININE 0.79 07/15/2020   CREATININE 0.76 03/10/2020   Patient has had a BMP in the past 1 year.  Multiple Care Gaps  Hep C Pap smear  Colonoscopy  Mammogram Eye exam   Depressed Mood: Patient has social stressors because she recently lost her job at [employer]  She is also lost her insurance, reports that she is trying to get another job but has had difficulty getting medical insurance.  She is attempted using [employer] for insurance but was unable to get anything.  She notes that she is running out of her savings and is worried about this.  She notes that she has a depressed mood secondary to these stressors but reports no SI/HI.   PERTINENT  PMH / PSH:  CAD, multiple vessel, S/P RCA DES HTN DM2 OSA on CPAP HLD Diabetic neuropathy  DDD OA  BMI elevated  Depression  RLS  Restrictive lung disease on PFTs   Patient Care Team: McDiarmid, Blane Ohara, MD as PCP - General (Family Medicine) OBJECTIVE:  BP (!) 150/66   Pulse 70   Ht 5\' 3"  (1.6 m)   Wt 235 lb  9.6 oz (106.9 kg)   LMP 01/13/2018 (LMP Unknown)   SpO2 95%   BMI 41.73 kg/m  Physical Exam  General: Alert and oriented in no apparent distress Heart: Regular rate and rhythm with no murmurs appreciated Respiratory: No respiratory distress Skin: warm and dry, no rashes noted Psych: Normal affect and mood, intermittently tearful but answers appropriately to questions    ASSESSMENT/PLAN:  Hypertension associated with diabetes ([place]) Assessment & Plan: Elevated on 2 checks today.  Possibly related to stress of her situation.  Will continue with amlodipine 10 mg and adding HCTZ 12.5 mg.  Patient verbalized understanding, return in 1 month for assessment.  Orders: -     hydroCHLOROthiazide; Take 1 tablet (12.5 mg total) by mouth daily.  Dispense: 30 tablet; Refill: 0  Painful diabetic neuropathy (HCC) -     Gabapentin; TAKE 2 TABLETS BY MOUTH THREE TIMES DAILY  Dispense: 180 tablet; Refill: 0  Type 2 diabetes mellitus with other circulatory complications (HCC) Assessment & Plan: stable - Last A1c:  Lab Results  Component Value Date   HGBA1C 7.5 (A) 12/27/2022   - Medications: Mounjaro and Farxiga  - Compliance: Patient is having difficulty obtaining her medications secondary to social stressors and losing her job.  I will send a message to Rosendo Gros to see if there are any assistance programs.  Additionally have provided  her with managed Medicaid information for her to apply.  I want the patient to return in 1 month to assess mood and medication follow-up.    Depressed mood Assessment & Plan:    01/21/2023    3:16 PM 01/11/2023   11:26 AM 12/27/2022    2:57 PM  PHQ9 SCORE ONLY  PHQ-9 Total Score 13 6 7    Patient with social stressors; will keep close eye on mood, provided supportive conversation. Stable for continued outpatient monitoring. Follow up 1 month.     Return in about 4 weeks (around 02/18/2023) for HTN, MOOD. Erskine Emery, MD 01/21/2023, 4:34 PM PGY-2, [place]

## 2023-01-21 NOTE — Assessment & Plan Note (Signed)
    01/21/2023    3:16 PM 01/11/2023   11:26 AM 12/27/2022    2:57 PM  PHQ9 SCORE ONLY  PHQ-9 Total Score 13 6 7    Patient with social stressors; will keep close eye on mood, provided supportive conversation. Stable for continued outpatient monitoring. Follow up 1 month.

## 2023-01-23 ENCOUNTER — Other Ambulatory Visit (HOSPITAL_COMMUNITY): Payer: Self-pay

## 2023-02-13 ENCOUNTER — Other Ambulatory Visit: Payer: Self-pay | Admitting: Student

## 2023-02-13 DIAGNOSIS — E1159 Type 2 diabetes mellitus with other circulatory complications: Secondary | ICD-10-CM

## 2023-02-13 MED ORDER — SEMAGLUTIDE(0.25 OR 0.5MG/DOS) 2 MG/1.5ML ~~LOC~~ SOPN
0.2500 mg | PEN_INJECTOR | SUBCUTANEOUS | 3 refills | Status: DC
Start: 2023-02-13 — End: 2023-02-16

## 2023-02-18 ENCOUNTER — Telehealth: Payer: Self-pay

## 2023-02-18 NOTE — Telephone Encounter (Signed)
-----   Message from Alfredo Martinez, MD sent at 02/13/2023 12:08 PM EDT ----- Order placed! ----- Message ----- From: Otho Najjar, CPhT Sent: 02/12/2023  12:41 PM EDT To: Alfredo Martinez, MD  I am so sorry, I couldn't remember if I responded to this! I can attempt to enroll her thru novo nordisks online portal. If that doesn't work, I will mail the application to her home. It would just need to be added to her med list first :) ----- Message ----- From: Alfredo Martinez, MD Sent: 01/26/2023   8:18 PM EDT To: Otho Najjar, CPhT  Patient is amenable to Ozempic patient assistance program. Do I need to place an order? ----- Message ----- From: McDiarmid, Leighton Roach, MD Sent: 01/24/2023   1:52 PM EDT To: Alfredo Martinez, MD  Sounds good by me. ----- Message ----- From: Alfredo Martinez, MD Sent: 01/24/2023  10:20 AM EDT To: Leighton Roach McDiarmid, MD; Otho Najjar, CPhT  Hey Dr. McDiarmid,   I think we can get assistance for Ozempic for this patient. Are you ok with me calling her and changing this? Does not seem like Mounjaro assistance programs are an option.  ----- Message ----- From: Otho Najjar, CPhT Sent: 01/23/2023   1:38 PM EDT To: Alfredo Martinez, MD  Sure, she should qualify for assistance with novo nordisk since she is uninsured.  ----- Message ----- From: Alfredo Martinez, MD Sent: 01/22/2023  11:26 AM EDT To: Otho Najjar, CPhT  Could we switch to Ozempic? ----- Message ----- From: Otho Najjar, CPhT Sent: 01/22/2023  11:19 AM EDT To: Alfredo Martinez, MD  Unfortunately [facility] currently doesn't have a patient assistance program :( ----- Message ----- From: Alfredo Martinez, MD Sent: 01/21/2023   4:43 PM EDT To: Otho Najjar, CPhT  Patient lost insurance on [place] for diabetes. Are there any programs that help pay for this for un-insured?  Thx!

## 2023-02-18 NOTE — Telephone Encounter (Signed)
Mailing novo nordisk application to patients home for ozempic assistance

## 2023-02-28 ENCOUNTER — Ambulatory Visit (INDEPENDENT_AMBULATORY_CARE_PROVIDER_SITE_OTHER): Payer: Self-pay | Admitting: Family Medicine

## 2023-02-28 ENCOUNTER — Encounter: Payer: Self-pay | Admitting: Family Medicine

## 2023-02-28 VITALS — BP 155/80 | HR 77 | Ht 63.0 in | Wt 237.1 lb

## 2023-02-28 DIAGNOSIS — I10 Essential (primary) hypertension: Secondary | ICD-10-CM

## 2023-02-28 DIAGNOSIS — E1159 Type 2 diabetes mellitus with other circulatory complications: Secondary | ICD-10-CM

## 2023-02-28 DIAGNOSIS — I214 Non-ST elevation (NSTEMI) myocardial infarction: Secondary | ICD-10-CM

## 2023-02-28 DIAGNOSIS — I2510 Atherosclerotic heart disease of native coronary artery without angina pectoris: Secondary | ICD-10-CM

## 2023-02-28 DIAGNOSIS — E1140 Type 2 diabetes mellitus with diabetic neuropathy, unspecified: Secondary | ICD-10-CM

## 2023-02-28 DIAGNOSIS — E785 Hyperlipidemia, unspecified: Secondary | ICD-10-CM

## 2023-02-28 DIAGNOSIS — I152 Hypertension secondary to endocrine disorders: Secondary | ICD-10-CM

## 2023-02-28 LAB — POCT GLYCOSYLATED HEMOGLOBIN (HGB A1C): HbA1c, POC (controlled diabetic range): 8.6 % — AB (ref 0.0–7.0)

## 2023-02-28 MED ORDER — SEMAGLUTIDE (1 MG/DOSE) 4 MG/3ML ~~LOC~~ SOPN: 2.0000 mg | PEN_INJECTOR | SUBCUTANEOUS | 5 refills | Status: DC

## 2023-02-28 MED ORDER — GABAPENTIN 600 MG PO TABS
ORAL_TABLET | ORAL | 0 refills | Status: DC
Start: 2023-03-06 — End: 2023-04-08

## 2023-02-28 MED ORDER — HYDROCHLOROTHIAZIDE 12.5 MG PO TABS: 12.5000 mg | ORAL_TABLET | Freq: Every day | ORAL | 0 refills | Status: DC

## 2023-02-28 MED ORDER — ATORVASTATIN CALCIUM 40 MG PO TABS: 40.0000 mg | ORAL_TABLET | Freq: Every day | ORAL | 1 refills | Status: DC

## 2023-02-28 MED ORDER — AMLODIPINE BESYLATE 10 MG PO TABS
10.0000 mg | ORAL_TABLET | Freq: Every day | ORAL | 3 refills | Status: DC
Start: 2023-03-06 — End: 2023-12-12

## 2023-02-28 MED ORDER — DAPAGLIFLOZIN PROPANEDIOL 10 MG PO TABS
10.0000 mg | ORAL_TABLET | Freq: Every day | ORAL | 3 refills | Status: DC
Start: 2023-03-06 — End: 2023-04-23

## 2023-02-28 NOTE — Patient Instructions (Signed)
Refills were sent to pharmacy to start on 03/06/23.  Your A1c was 8.5% which is higher than 7.5% in February.   We can talk about your neuropathy medications once your insurance is in place.

## 2023-03-01 ENCOUNTER — Encounter: Payer: Self-pay | Admitting: Family Medicine

## 2023-03-01 NOTE — Assessment & Plan Note (Signed)
Uncontrolled Has been taking her BP meds every other day to make them last. Refills of meds sent to pharmacy to fill 03/06/23 when new insurance kicks in. RTC 4 weeks

## 2023-03-01 NOTE — Progress Notes (Signed)
[name] is alone Sources of clinical information for visit is/are patient. Nursing assessment for this office visit was reviewed with the patient for accuracy and revision.   Previous Report(s) Reviewed: none     02/28/2023    3:04 PM  Depression screen PHQ 2/9  Decreased Interest 1  Down, Depressed, Hopeless 1  PHQ - 2 Score 2  Altered sleeping 1  Tired, decreased energy 1  Change in appetite 2  Feeling bad or failure about yourself  1  Trouble concentrating 1  Moving slowly or fidgety/restless 1  Suicidal thoughts 0  PHQ-9 Score 9   Flowsheet Row Office Visit from 02/28/2023 in [facility] Office Visit from 01/21/2023 in [facility] Office Visit from 01/11/2023 in [facility]  Thoughts that you would be better off dead, or of hurting yourself in some way Not at all Not at all Not at all  PHQ-9 Total Score 9 13 6           01/21/2023    3:16 PM 01/11/2023   11:26 AM 06/15/2022    2:37 PM 07/21/2020    2:55 PM 04/24/2016    3:04 PM  Fall Risk   Falls in the past year? 0 0 0 0 Yes  Number falls in past yr: 0 0 0  1  Injury with Fall? 0 0 0  Yes  Risk Factor Category      High Fall Risk  Risk for fall due to :     History of fall(s);Other (Comment)  Risk for fall due to: Comment     Lt knee buckles, bilateral diabetic foot pain  Follow up     Falls evaluation completed       02/28/2023    3:04 PM 01/21/2023    3:16 PM 01/11/2023   11:26 AM  PHQ9 SCORE ONLY  PHQ-9 Total Score 9 13 6     There are no preventive care reminders to display for this patient.  Health Maintenance Due  Topic Date Due   Hepatitis C Screening  Never done   Zoster Vaccines- Shingrix (1 of 2) Never done   PAP SMEAR-Modifier  Never done   COLONOSCOPY (Pts 45-77yrs Insurance coverage will need to be confirmed)  Never done   OPHTHALMOLOGY EXAM  11/23/2014   MAMMOGRAM  Never done     History/P.E. limitations: none  There are no  preventive care reminders to display for this patient.  Diabetes Health Maintenance Due  Topic Date Due   OPHTHALMOLOGY EXAM  11/23/2014   FOOT EXAM  04/18/2023   HEMOGLOBIN A1C  08/30/2023    Health Maintenance Due  Topic Date Due   Hepatitis C Screening  Never done   Zoster Vaccines- Shingrix (1 of 2) Never done   PAP SMEAR-Modifier  Never done   COLONOSCOPY (Pts 45-21yrs Insurance coverage will need to be confirmed)  Never done   OPHTHALMOLOGY EXAM  11/23/2014   MAMMOGRAM  Never done     Chief Complaint  Patient presents with   Follow-up    A1C     --------------------------------------------------------------------------------------------------------------------------------------------- Visit Problem List with A/P  No problem-specific Assessment & Plan notes found for this encounter.

## 2023-03-01 NOTE — Assessment & Plan Note (Signed)
Established problem Lab Results  Component Value Date   HGBA1C 8.6 (A) 02/28/2023    Uncontrolled.  Patient is not at goal of A1c < 7.5%. She is taking her Metformin and Longacting Insulin 28 units daily She has been taking Ozempic sample from our office, 1 mg weekly for last several weeks.  Since restart of Ozempic, she has noted her fasting capillary blood glucose in 180 to 220 range, instead of 250-350 before.    Patient has been out of her Marcelline Deist because of not having meet high deductible She will start a Insurance [employer] insurance 03/06/23 if all goes right Her copays will be low and no deductible  Refills of her Marcelline Deist, Ozempic at 2 mg weekly sent to pharmacy to fill 03/06/23.  RTC 4 weeks to see if transition to new insurance has provided her access to her medications.

## 2023-03-08 ENCOUNTER — Other Ambulatory Visit (HOSPITAL_COMMUNITY): Payer: Self-pay

## 2023-03-08 ENCOUNTER — Telehealth: Payer: Self-pay

## 2023-03-08 NOTE — Telephone Encounter (Signed)
A Prior Authorization was initiated for this patients OZEMPIC through CoverMyMeds.   Key: [id number]

## 2023-03-11 NOTE — Telephone Encounter (Signed)
Prior Auth for patients medication OZEMPIC approved by [facility] from 03/09/23 to 03/08/24.  CoverMyMeds Key: [id number]

## 2023-03-12 ENCOUNTER — Other Ambulatory Visit (HOSPITAL_COMMUNITY): Payer: Self-pay

## 2023-03-12 NOTE — Telephone Encounter (Signed)
Pt's medication was approved, however her copay is over $900. Copay card will only cover up to $150 of this amount.  Suggestions?

## 2023-03-18 NOTE — Telephone Encounter (Signed)
Reviewed

## 2023-03-21 NOTE — Telephone Encounter (Signed)
Patient calls nurse line upset about Ozempic denial.   I advised patient we sent application to her home last month for possible medication assistance.   She reports she has not checked her mail and will do so when she gets home.   Patient will let me know if she did not receive application.

## 2023-03-28 ENCOUNTER — Encounter: Payer: Self-pay | Admitting: Family Medicine

## 2023-03-28 ENCOUNTER — Ambulatory Visit (INDEPENDENT_AMBULATORY_CARE_PROVIDER_SITE_OTHER): Payer: Self-pay | Admitting: Family Medicine

## 2023-03-28 VITALS — BP 146/65 | HR 67 | Ht 63.0 in | Wt 239.8 lb

## 2023-03-28 DIAGNOSIS — E1159 Type 2 diabetes mellitus with other circulatory complications: Secondary | ICD-10-CM

## 2023-03-28 DIAGNOSIS — M79644 Pain in right finger(s): Secondary | ICD-10-CM

## 2023-03-28 NOTE — Patient Instructions (Addendum)
Please give yourself 28 units of the Lantus daily.   Dr Kamika Goodloe will give your Patient Assistance Application to our the clinic's pharmacy to apply for Ozempic medication assistance.    Use bandaids to splint your right finger.  Rest it for two weeks, then see if the pain has decreased.    Once you have your insurance, we will get an Xray of that finger and thumb.   Dr Mao Lockner would like to see you back in five weeks to follow up your diabetes and high blood pressure.

## 2023-03-28 NOTE — Progress Notes (Signed)
[name] is alone Sources of clinical information for visit is/are patient. Nursing assessment for this office visit was reviewed with the patient for accuracy and revision.     Previous Report(s) Reviewed: none     03/28/2023   11:14 AM  Depression screen PHQ 2/9  Decreased Interest 2  Down, Depressed, Hopeless 2  PHQ - 2 Score 4  Altered sleeping 2  Tired, decreased energy 2  Change in appetite 2  Feeling bad or failure about yourself  2  Trouble concentrating 2  Moving slowly or fidgety/restless 1  Suicidal thoughts 1  PHQ-9 Score 16   Flowsheet Row Office Visit from 03/28/2023 in [facility] Office Visit from 02/28/2023 in [facility] Office Visit from 01/21/2023 in [facility]  Thoughts that you would be better off dead, or of hurting yourself in some way Several days Not at all Not at all  PHQ-9 Total Score 16 9 13           01/21/2023    3:16 PM 01/11/2023   11:26 AM 06/15/2022    2:37 PM 07/21/2020    2:55 PM 04/24/2016    3:04 PM  Fall Risk   Falls in the past year? 0 0 0 0 Yes  Number falls in past yr: 0 0 0  1  Injury with Fall? 0 0 0  Yes  Risk Factor Category      High Fall Risk  Risk for fall due to :     History of fall(s);Other (Comment)  Risk for fall due to: Comment     Lt knee buckles, bilateral diabetic foot pain  Follow up     Falls evaluation completed       03/28/2023   11:14 AM 02/28/2023    3:04 PM 01/21/2023    3:16 PM  PHQ9 SCORE ONLY  PHQ-9 Total Score 16 9 13     There are no preventive care reminders to display for this patient.  Health Maintenance Due  Topic Date Due   Hepatitis C Screening  Never done   PAP SMEAR-Modifier  Never done   Colonoscopy  Never done   OPHTHALMOLOGY EXAM  11/23/2014   MAMMOGRAM  Never done   Zoster Vaccines- Shingrix (1 of 2) Never done   COVID-19 Vaccine (3 - 2023-24 season) 07/06/2022      History/P.E. limitations: none  There are  no preventive care reminders to display for this patient.  Diabetes Health Maintenance Due  Topic Date Due   OPHTHALMOLOGY EXAM  11/23/2014   FOOT EXAM  04/18/2023   HEMOGLOBIN A1C  08/30/2023    Health Maintenance Due  Topic Date Due   Hepatitis C Screening  Never done   PAP SMEAR-Modifier  Never done   Colonoscopy  Never done   OPHTHALMOLOGY EXAM  11/23/2014   MAMMOGRAM  Never done   Zoster Vaccines- Shingrix (1 of 2) Never done   COVID-19 Vaccine (3 - 2023-24 season) 07/06/2022     Chief Complaint  Patient presents with   Hand Pain     --------------------------------------------------------------------------------------------------------------------------------------------- Visit Problem List with A/P  No problem-specific Assessment & Plan notes found for this encounter.

## 2023-03-28 NOTE — Assessment & Plan Note (Signed)
New problem Onset couple weeks ago Localized to 2nd MCP right hand. May get stuck in flexion at times  Pain at index MCP grind test.  No locking MCP prominent.   Splint with double bandaid over first phalanx.

## 2023-03-28 NOTE — Assessment & Plan Note (Signed)
Established problem Uncontrolled.  Patient is not at goal of A1c < 7.5% [name] is not currently insured.  She has been unable to afford her Jardiance and Ozempic and Glargine She is working part-time in [employer], but not enough to qualify [employer]. She believes she will be getting a Veterinary surgeon that may cover her Ozempic and [place].   Samples of 3 Lantus 100/ml 3 ml pens.  Restart Lantus at her home dose of 28 units qam.  Patient Assistance form for Ozempic given to pharmacy to help process it correctly.   Will see if Jamie Toriz can restart metformin.

## 2023-04-03 ENCOUNTER — Other Ambulatory Visit (HOSPITAL_COMMUNITY): Payer: Self-pay

## 2023-04-03 NOTE — Telephone Encounter (Signed)
Rec'd patients portion back of novo nordisk application, however patient now has Nurse, learning disability and will not qualify for assistance with novo nordisk. Per 03/08/23 note, PA was approved, however copay is expensive for patient.   Will wait to see what PCP decides.

## 2023-04-06 ENCOUNTER — Other Ambulatory Visit: Payer: Self-pay | Admitting: Student

## 2023-04-06 DIAGNOSIS — E1140 Type 2 diabetes mellitus with diabetic neuropathy, unspecified: Secondary | ICD-10-CM

## 2023-04-08 ENCOUNTER — Other Ambulatory Visit (HOSPITAL_COMMUNITY): Payer: Self-pay

## 2023-04-08 ENCOUNTER — Telehealth: Payer: Self-pay

## 2023-04-08 NOTE — Telephone Encounter (Signed)
Left message requesting call back regarding medication assistance/ PA  Unsure of [place] or Jardiance. Pt has commercial and Part D insurance as of now.

## 2023-04-09 ENCOUNTER — Other Ambulatory Visit (HOSPITAL_COMMUNITY): Payer: Self-pay

## 2023-04-09 NOTE — Telephone Encounter (Signed)
Spoke with patient who says the meds she is attempting to get coverage for are Ozempic and [place].   Informed patient both meds will need new PA's since she started a new insurance Administrator). Also informed her she is no longer eligible for PAP due to having commercial coverage. Pt understood.   Will initiate PA's and report to PCP.

## 2023-04-10 ENCOUNTER — Other Ambulatory Visit (HOSPITAL_COMMUNITY): Payer: Self-pay

## 2023-04-10 ENCOUNTER — Telehealth: Payer: Self-pay

## 2023-04-10 DIAGNOSIS — E1159 Type 2 diabetes mellitus with other circulatory complications: Secondary | ICD-10-CM

## 2023-04-10 DIAGNOSIS — E1140 Type 2 diabetes mellitus with diabetic neuropathy, unspecified: Secondary | ICD-10-CM

## 2023-04-10 NOTE — Telephone Encounter (Signed)
A Prior Authorization was initiated for this patients OZEMPIC through CoverMyMeds.   Key: [id number]

## 2023-04-10 NOTE — Telephone Encounter (Signed)
Called [employer] to do PA request over the phone. PA pending decision.  Case ID: [id number]

## 2023-04-11 ENCOUNTER — Other Ambulatory Visit (HOSPITAL_COMMUNITY): Payer: Self-pay

## 2023-04-11 NOTE — Telephone Encounter (Signed)
Prior Auth for patients medication FARXIGA denied by [facility] [employer] via CoverMyMeds.   Reason: needs trial and failure of Jardiance. [place] Pepper will require a PA as well. Will initiate one in the meantime.

## 2023-04-11 NOTE — Telephone Encounter (Signed)
reviewed

## 2023-04-11 NOTE — Telephone Encounter (Signed)
Reviewed

## 2023-04-11 NOTE — Telephone Encounter (Signed)
Prior Auth for patients medication OZEMPIC denied by [employer] via CoverMyMeds.   Reason: needs trial and failure of Trulicity and Victoza. Seen patient has taken both in past but unsure of reason for therapy changes.    CoverMyMeds Key: [id number]

## 2023-04-11 NOTE — Telephone Encounter (Signed)
A Prior Authorization was initiated for this patients JARDIANCE through CoverMyMeds.   Key: [name]

## 2023-04-11 NOTE — Telephone Encounter (Signed)
Prior Auth for patients medication JARDIANCE approved by [facility] [employer] from 04/11/23 to 04/09/24.  Medication will need to be sent to patients pharmacy  PA Case ID: [id number]

## 2023-04-18 ENCOUNTER — Ambulatory Visit: Payer: Self-pay | Admitting: Family Medicine

## 2023-04-22 NOTE — Telephone Encounter (Signed)
Patient calls to check on PA status of GLP1, she currently almost out of Lantus and is aware that the ozempic is denied. Wants to know if she will continue with Victoza and Trulicity.

## 2023-04-23 ENCOUNTER — Other Ambulatory Visit (HOSPITAL_COMMUNITY): Payer: Self-pay

## 2023-04-23 MED ORDER — EMPAGLIFLOZIN 10 MG PO TABS
10.0000 mg | ORAL_TABLET | Freq: Every day | ORAL | 3 refills | Status: DC
Start: 2023-04-23 — End: 2023-12-12

## 2023-04-23 MED ORDER — TRULICITY 0.75 MG/0.5ML ~~LOC~~ SOAJ
0.7500 mg | SUBCUTANEOUS | 0 refills | Status: DC
Start: 2023-04-23 — End: 2023-05-07

## 2023-04-23 NOTE — Telephone Encounter (Signed)
Contacted patient to clarify that [name] was OK for her to take in combination.   She is aware that the PA was approved for Jardiance.   New prescription for Jardiance (empagliflozin) 10mg  sent to her pharmacy.   Patient plans to pick up medication later today.

## 2023-04-23 NOTE — Addendum Note (Signed)
Addended byPerley Jain, Eleftheria Taborn D on: 04/23/2023 09:08 AM   Modules accepted: Orders

## 2023-04-23 NOTE — Telephone Encounter (Signed)
Prescription for Trulicity 0.75 mg  injection weekly sent. F/U office visit w Jibril Mcminn on 6/27.

## 2023-04-23 NOTE — Telephone Encounter (Signed)
Spoke with patient. Informed her of medication that was sent in. She also asked about being able to take Jardiance as well. Aquilla Solian, CMA'

## 2023-04-23 NOTE — Addendum Note (Signed)
Addended by: Kathrin Ruddy on: 04/23/2023 03:17 PM   Modules accepted: Orders

## 2023-04-24 NOTE — Telephone Encounter (Signed)
Reviewed and agree with Dr Koval's plan.   

## 2023-04-26 ENCOUNTER — Telehealth: Payer: Self-pay

## 2023-04-26 NOTE — Telephone Encounter (Signed)
A Prior Authorization was initiated for this patients TRULICITY through CoverMyMeds.   Key: [id number]

## 2023-04-27 NOTE — Telephone Encounter (Signed)
Prior Auth for patients medication TRULICITY approved by [facility] [employer] from 04/26/23 to 04/25/24.  CoverMyMeds Key: [facility] PA Case ID #: [id number]

## 2023-05-02 ENCOUNTER — Ambulatory Visit: Payer: Self-pay | Admitting: Family Medicine

## 2023-05-06 ENCOUNTER — Telehealth: Payer: Self-pay

## 2023-05-06 NOTE — Telephone Encounter (Signed)
Patient calls nurse line regarding diabetic medication management.   She states that she is unable to afford the Trulicity at this time. She is requesting that provider send in prescription for Lantus in place of Trulicity.   Please advise.   Veronda Prude, RN

## 2023-05-07 MED ORDER — INSULIN GLARGINE 100 UNIT/ML SOLOSTAR PEN: 28.0000 [IU] | PEN_INJECTOR | SUBCUTANEOUS | 0 refills | Status: DC

## 2023-05-07 NOTE — Telephone Encounter (Signed)
Stop Trulicity due to cost for patient Restart prior lantus dose 28 unit [place] qam Disp 3 months RF 1

## 2023-08-08 ENCOUNTER — Other Ambulatory Visit: Payer: Self-pay | Admitting: Family Medicine

## 2023-08-08 DIAGNOSIS — E1140 Type 2 diabetes mellitus with diabetic neuropathy, unspecified: Secondary | ICD-10-CM

## 2023-10-31 ENCOUNTER — Other Ambulatory Visit: Payer: Self-pay | Admitting: Family Medicine

## 2023-10-31 DIAGNOSIS — E1140 Type 2 diabetes mellitus with diabetic neuropathy, unspecified: Secondary | ICD-10-CM

## 2023-11-07 ENCOUNTER — Other Ambulatory Visit (HOSPITAL_COMMUNITY): Payer: Self-pay

## 2023-12-12 ENCOUNTER — Ambulatory Visit (INDEPENDENT_AMBULATORY_CARE_PROVIDER_SITE_OTHER): Payer: No Typology Code available for payment source | Admitting: Student

## 2023-12-12 ENCOUNTER — Encounter: Payer: Self-pay | Admitting: Student

## 2023-12-12 VITALS — BP 131/54 | HR 60 | Ht 62.0 in | Wt 244.8 lb

## 2023-12-12 DIAGNOSIS — Z1159 Encounter for screening for other viral diseases: Secondary | ICD-10-CM

## 2023-12-12 DIAGNOSIS — I152 Hypertension secondary to endocrine disorders: Secondary | ICD-10-CM

## 2023-12-12 DIAGNOSIS — E785 Hyperlipidemia, unspecified: Secondary | ICD-10-CM | POA: Diagnosis not present

## 2023-12-12 DIAGNOSIS — E1169 Type 2 diabetes mellitus with other specified complication: Secondary | ICD-10-CM | POA: Diagnosis not present

## 2023-12-12 DIAGNOSIS — E1159 Type 2 diabetes mellitus with other circulatory complications: Secondary | ICD-10-CM | POA: Diagnosis not present

## 2023-12-12 DIAGNOSIS — I2510 Atherosclerotic heart disease of native coronary artery without angina pectoris: Secondary | ICD-10-CM

## 2023-12-12 DIAGNOSIS — R053 Chronic cough: Secondary | ICD-10-CM | POA: Insufficient documentation

## 2023-12-12 LAB — POCT GLYCOSYLATED HEMOGLOBIN (HGB A1C): HbA1c, POC (controlled diabetic range): 7.7 % — AB (ref 0.0–7.0)

## 2023-12-12 MED ORDER — SEMAGLUTIDE(0.25 OR 0.5MG/DOS) 2 MG/1.5ML ~~LOC~~ SOPN: 0.2500 mg | PEN_INJECTOR | SUBCUTANEOUS | 0 refills | Status: DC

## 2023-12-12 MED ORDER — ATORVASTATIN CALCIUM 40 MG PO TABS: 40.0000 mg | ORAL_TABLET | Freq: Every day | ORAL | 1 refills | Status: DC

## 2023-12-12 NOTE — Assessment & Plan Note (Signed)
 Has not been taking atorvastatin  -lipid panel today -rx atorvastatin 

## 2023-12-12 NOTE — Assessment & Plan Note (Addendum)
 A1c of 7.7 (goal of 7) Denies hx thyroid cancer. Rx Ozempic  0.25 inj weekly for blood sugar control and to aid in weight loss, increase as tolerated every 4 weeks Next A1c in 3 months

## 2023-12-12 NOTE — Progress Notes (Signed)
    SUBJECTIVE:   CHIEF COMPLAINT / HPI:   T2DM and obesity  Last A1c 02/28/23 of 8.6 Currently taking no medications. Is interested in starting ozempic   Eating sweets 4-5x/day Is on her feet at work but does not exercise   HTN Currently taking no medications   Cough  Every 2-3 months gets a productive cough with ceamy mucus for past year which resolves on its own. Denies any other associated symptoms. Does smoke marijuana but has never smokes cigarettes.  PERTINENT  PMH / PSH: T2DM, HTN, CAD, HLD, OSA, obesity  OBJECTIVE:   BP (!) 131/54   Pulse 60   Ht 5' 2 (1.575 m)   Wt 244 lb 12.8 oz (111 kg)   LMP 01/13/2018 (LMP Unknown)   SpO2 94%   BMI 44.77 kg/m    General: NAD, pleasant, obese Cardiac: RRR, no murmurs. Respiratory: mild rhoncorous sound at RLL otherwise CTAB Neuro: alert, no obvious focal deficits Psych: Normal affect and mood  ASSESSMENT/PLAN:   Type 2 diabetes mellitus with other circulatory complications (HCC) A1c of 7.7 (goal of 7) Denies hx thyroid cancer. Rx Ozempic  0.25 inj weekly for blood sugar control and to aid in weight loss, increase as tolerated every 4 weeks Next A1c in 3 months   Hypertension associated with diabetes (HCC) Currently controlled off of medications with diastolic on lower end so will not start medications today. Will continue to monitor BP and if indicated would start ACE/ARB given T2DM  Chronic cough No cough today and is breathing comfortably on RA. Given intermittent productive cough has been present for past year along with mild rhonchi of RLL, will get CXR to look for any abnormalities. Considered bronchiectasis but unlikely to see this on CXR.  Low concern for malignancy given no weight loss. Consider COPD though no hx of smoking cigarettes. More likely to be associated with OHS.  Morbid (severe) obesity due to excess calories (HCC) Rx ozempic  for both T2DM and weight loss. Goals: Walk 3x/week for 20 minutes and  limit sweets to no more than 2x/day Plan for regular f/u to assess goals  Hyperlipidemia associated with type 2 diabetes mellitus (HCC) Has not been taking atorvastatin  -lipid panel today -rx atorvastatin      HM: hep C screening today. Apt scheduled for 2/13 with me (PCP only had a.m. apts which she cannot do) for pap smear and other HM items   Dr. Lauraine Molt, DO [facility]

## 2023-12-12 NOTE — Assessment & Plan Note (Addendum)
 No cough today and is breathing comfortably on RA. Given intermittent productive cough has been present for past year along with mild rhonchi of RLL, will get CXR to look for any abnormalities. Considered bronchiectasis but unlikely to see this on CXR.  Low concern for malignancy given no weight loss. Consider COPD though no hx of smoking cigarettes. More likely to be associated with OHS.

## 2023-12-12 NOTE — Assessment & Plan Note (Signed)
 Rx ozempic  for both T2DM and weight loss. Goals: Walk 3x/week for 20 minutes and limit sweets to no more than 2x/day Plan for regular f/u to assess goals

## 2023-12-12 NOTE — Patient Instructions (Addendum)
 It was great to see you! Thank you for allowing me to participate in your care!   Our plans for today:  - We started you on ozempic . If you tolerate this well, we can increase the dose in 4 weeks - I sent in a refill of your cholesterol medication  - return for pap smear on 12/19/23 and we can follow up labs. If anything is concerning I will call you.  - Goals: Walk 3x/week for 20 minutes and limit sweets to no more than 2x/day - go get chest x ray at [facility]     Take care and seek immediate care sooner if you develop any concerns.   Dr. Lauraine Molt, DO [facility]

## 2023-12-12 NOTE — Assessment & Plan Note (Signed)
 Currently controlled off of medications with diastolic on lower end so will not start medications today. Will continue to monitor BP and if indicated would start ACE/ARB given T2DM

## 2023-12-13 ENCOUNTER — Telehealth: Payer: Self-pay

## 2023-12-13 LAB — HCV RT-PCR, QUANT (NON-GRAPH)

## 2023-12-13 NOTE — Telephone Encounter (Signed)
 Pharmacy Patient Advocate Encounter   Received notification from CoverMyMeds that prior authorization for OZEMPIC  is required/requested.   Insurance verification completed.   The patient is insured through [facility] .   PA required; PA submitted to above mentioned insurance via CoverMyMeds Key/confirmation #/EOC [id number]. Status is pending

## 2023-12-14 LAB — LIPID PANEL
Chol/HDL Ratio: 4.3 {ratio} (ref 0.0–4.4)
Cholesterol, Total: 209 mg/dL — ABNORMAL HIGH (ref 100–199)
HDL: 49 mg/dL (ref >39)
LDL Chol Calc (NIH): 134 mg/dL — ABNORMAL HIGH (ref 0–99)
Triglycerides: 145 mg/dL (ref 0–149)
VLDL Cholesterol Cal: 26 mg/dL (ref 5–40)

## 2023-12-14 LAB — BASIC METABOLIC PANEL
BUN/Creatinine Ratio: 22 (ref 9–23)
BUN: 16 mg/dL (ref 6–24)
CO2: 22 mmol/L (ref 20–29)
Calcium: 9.8 mg/dL (ref 8.7–10.2)
Chloride: 105 mmol/L (ref 96–106)
Creatinine, Ser: 0.74 mg/dL (ref 0.57–1.00)
Glucose: 158 mg/dL — ABNORMAL HIGH (ref 70–99)
Potassium: 4.1 mmol/L (ref 3.5–5.2)
Sodium: 142 mmol/L (ref 134–144)
eGFR: 95 mL/min/{1.73_m2} (ref >59)

## 2023-12-14 LAB — HCV RT-PCR, QUANT (NON-GRAPH)

## 2023-12-14 LAB — HCV AB W REFLEX TO QUANT PCR: HCV Ab: REACTIVE — AB

## 2023-12-16 ENCOUNTER — Ambulatory Visit
Admission: RE | Admit: 2023-12-16 | Discharge: 2023-12-16 | Disposition: A | Discharge status: Home / Self Care | Payer: No Typology Code available for payment source | Source: Ambulatory Visit | Attending: Family Medicine

## 2023-12-16 DIAGNOSIS — R053 Chronic cough: Secondary | ICD-10-CM

## 2023-12-16 NOTE — Telephone Encounter (Signed)
 Pharmacy Patient Advocate Encounter  Received notification from [facility] that Prior Authorization for OZEMPIC  has been DENIED.  Full denial letter will be uploaded to the media tab. See denial reason below.

## 2023-12-19 ENCOUNTER — Ambulatory Visit (INDEPENDENT_AMBULATORY_CARE_PROVIDER_SITE_OTHER): Payer: No Typology Code available for payment source | Admitting: Student

## 2023-12-19 ENCOUNTER — Encounter: Payer: Self-pay | Admitting: Student

## 2023-12-19 VITALS — BP 198/78 | HR 74 | Ht 62.0 in | Wt 247.4 lb

## 2023-12-19 DIAGNOSIS — I10 Essential (primary) hypertension: Secondary | ICD-10-CM

## 2023-12-19 DIAGNOSIS — R9389 Abnormal findings on diagnostic imaging of other specified body structures: Secondary | ICD-10-CM

## 2023-12-19 NOTE — Progress Notes (Unsigned)
    SUBJECTIVE:   Pt had apt time confused, came early and had to leave before being seen to pick up her grand kids. Office visit rescheduled.  See telephone note.   OBJECTIVE:   BP (!) 198/78   Pulse 74   Ht 5\' 2"  (1.575 m)   Wt 247 lb 6.4 oz (112.2 kg)   LMP 01/13/2018 (LMP Unknown)   SpO2 96%   BMI 45.25 kg/m      ASSESSMENT/PLAN:    Dr. Erick Alley, DO [facility]

## 2023-12-20 ENCOUNTER — Telehealth: Payer: Self-pay | Admitting: Student

## 2023-12-20 ENCOUNTER — Ambulatory Visit: Payer: Self-pay

## 2023-12-20 DIAGNOSIS — R9389 Abnormal findings on diagnostic imaging of other specified body structures: Secondary | ICD-10-CM

## 2023-12-20 DIAGNOSIS — R053 Chronic cough: Secondary | ICD-10-CM

## 2023-12-20 NOTE — Telephone Encounter (Addendum)
 Pt had apt yesterday but had apt time mixed up, came early and had to leave to pick up her grandkids before being seen.   Her BP was 198/78.  Called pt who agrees to be seen in ATC this afternoon for BP check. She denies any SOB, chest pain, changes in vision and states she feels well. She is currently taking no HTN medications.   We also schedule apt with me for 2/20 for pap smear, and discuss weight loss efforts and T2DM as insurance would not cover Ozempic. Will also discuss pos. Hep C antibody - could be false neg vs previous infection given neg PCR.   CXR ordered at last visit on 12/12/23 d/t chronic intermittent productive cough for past year.  It showed opacity in R lobe consistent with mild rhonchi heard on exam.  She is not on ACE inhibitor as cause of chronic cough and has no postnasal drip. Intermittent productive cough with "creamy" mucus not consistent with GERD. Smokes marijuana but not cigarettes.   Opacity on CXR needs to be further evaluated with chest CT which pt agrees with. CT ordered, will ask CMAs to schedule.

## 2023-12-26 ENCOUNTER — Ambulatory Visit: Payer: Self-pay | Admitting: Student

## 2023-12-31 ENCOUNTER — Other Ambulatory Visit: Payer: Self-pay | Admitting: Family Medicine

## 2023-12-31 ENCOUNTER — Ambulatory Visit (HOSPITAL_COMMUNITY): Payer: No Typology Code available for payment source

## 2023-12-31 DIAGNOSIS — E1140 Type 2 diabetes mellitus with diabetic neuropathy, unspecified: Secondary | ICD-10-CM

## 2023-12-31 NOTE — Telephone Encounter (Signed)
 Pt called in wanting a refill on gabapentin. Please advise. Monti Villers Bruna Potter, CMA

## 2024-01-01 ENCOUNTER — Other Ambulatory Visit: Payer: Self-pay | Admitting: Family Medicine

## 2024-01-01 DIAGNOSIS — E1140 Type 2 diabetes mellitus with diabetic neuropathy, unspecified: Secondary | ICD-10-CM

## 2024-01-01 NOTE — Telephone Encounter (Signed)
 Patient called after hours emergency line requesting refill of her gabapentin. Reviewed procedure for requesting refills and she notes that she has already called the pharmacy and was told they would be placing refill request to our office. I see that this has indeed happened. Will sign for refill at this time.   Eliezer Mccoy, MD

## 2024-01-01 NOTE — Telephone Encounter (Signed)
 Patient has an appt scheduled on 01/06/24 @230  with Dr.Jones.

## 2024-01-01 NOTE — Telephone Encounter (Signed)
 Patient will need an appointment with [facility] physician about her gabapentin before any further refills beyond this one.

## 2024-01-02 ENCOUNTER — Ambulatory Visit (INDEPENDENT_AMBULATORY_CARE_PROVIDER_SITE_OTHER): Payer: No Typology Code available for payment source | Admitting: Podiatry

## 2024-01-02 DIAGNOSIS — M79675 Pain in left toe(s): Secondary | ICD-10-CM

## 2024-01-02 DIAGNOSIS — R234 Changes in skin texture: Secondary | ICD-10-CM | POA: Diagnosis not present

## 2024-01-02 DIAGNOSIS — B351 Tinea unguium: Secondary | ICD-10-CM | POA: Diagnosis not present

## 2024-01-02 DIAGNOSIS — M79674 Pain in right toe(s): Secondary | ICD-10-CM

## 2024-01-02 MED ORDER — MUPIROCIN 2 % EX OINT: 1.0000 | TOPICAL_OINTMENT | Freq: Two times a day (BID) | CUTANEOUS | 2 refills | Status: DC

## 2024-01-02 NOTE — Progress Notes (Signed)
 No Subjective:  Patient ID: [name], female    DOB: June 06, 1966,  MRN: [id number]  Chief Complaint  Patient presents with   Nail Problem    Nail trim     58 y.o. female presents with the above complaint. History confirmed with patient.  Here today her nails are thickened elongated causing pain she has a new heel fissure  Objective:  Physical Exam: warm, good capillary refill, no trophic changes or ulcerative lesions, normal DP and PT pulses, and normal sensory exam. Left Foot: dystrophic yellowed discolored nail plates with subungual debris, has a new healed fissure no signs of infection or subcutaneous skin breakdown Right Foot: dystrophic yellowed discolored nail plates with subungual debris  Assessment:   1. Pain due to onychomycosis of toenails of both feet   2. Skin fissure      Plan:  Patient was evaluated and treated and all questions answered.  Discussed the etiology and treatment options for the condition in detail with the patient. Educated patient on the topical and oral treatment options for mycotic nails. Recommended debridement of the nails today. Sharp and mechanical debridement performed of all painful and mycotic nails today. Nails debrided in length and thickness using a nail nipper to level of comfort. Discussed treatment options including appropriate shoe gear.  Return in 3 months   Has a heel fissure on the left heel prescribed mupirocin ointment discussed using moisturizing cream watch for signs of infection such as redness swelling increasing drainage she will let me know if this develops and see me sooner than 14-month visit  Return in about 3 months (around 03/31/2024) for at risk diabetic foot care.

## 2024-01-03 ENCOUNTER — Telehealth: Payer: Self-pay

## 2024-01-03 NOTE — Telephone Encounter (Signed)
 CT has been scheduled, patient informed. Penni Bombard CMA

## 2024-01-03 NOTE — Telephone Encounter (Signed)
-----   Message from [facility] Jasmine December S sent at 01/02/2024  2:04 PM EST ----- Ok to schedule CT at [facility].  Thanks, Melvenia Beam

## 2024-01-06 ENCOUNTER — Ambulatory Visit: Payer: No Typology Code available for payment source | Admitting: Student

## 2024-01-06 NOTE — Progress Notes (Signed)
 Reviewed and agree with Dr Macky Lower plan.

## 2024-01-06 NOTE — Progress Notes (Deleted)
    SUBJECTIVE:   CHIEF COMPLAINT / HPI:   Obesity ***  PERTINENT  PMH / PSH: ***  OBJECTIVE:   LMP 01/13/2018 (LMP Unknown)  ***  General: NAD, pleasant, able to participate in exam Cardiac: RRR, no murmurs. Respiratory: CTAB, normal effort, No wheezes, rales or rhonchi Abdomen: Bowel sounds present, nontender, nondistended, no hepatosplenomegaly. Extremities: no edema or cyanosis. Skin: warm and dry, no rashes noted Neuro: alert, no obvious focal deficits Psych: Normal affect and mood  ASSESSMENT/PLAN:   No problem-specific Assessment & Plan notes found for this encounter.     Dr. Erick Alley, DO [facility]    {    This will disappear when note is signed, click to select method of visit    :1}

## 2024-01-08 ENCOUNTER — Ambulatory Visit (HOSPITAL_COMMUNITY): Payer: No Typology Code available for payment source

## 2024-01-16 NOTE — Addendum Note (Signed)
 Addended by: Howard Pouch on: 01/16/2024 10:47 AM   Modules accepted: Orders

## 2024-01-20 ENCOUNTER — Ambulatory Visit (HOSPITAL_COMMUNITY)
Admission: RE | Admit: 2024-01-20 | Discharge: 2024-01-20 | Disposition: A | Discharge status: Home / Self Care | Source: Ambulatory Visit | Attending: Family Medicine | Admitting: Family Medicine

## 2024-01-20 DIAGNOSIS — R053 Chronic cough: Secondary | ICD-10-CM

## 2024-01-20 DIAGNOSIS — R9389 Abnormal findings on diagnostic imaging of other specified body structures: Secondary | ICD-10-CM

## 2024-02-04 ENCOUNTER — Ambulatory Visit: Admitting: Student

## 2024-02-04 NOTE — Progress Notes (Deleted)
    SUBJECTIVE:   CHIEF COMPLAINT / HPI:   ***  PERTINENT  PMH / PSH: ***  OBJECTIVE:   LMP 01/13/2018 (LMP Unknown)  ***  General: NAD, pleasant, able to participate in exam Cardiac: RRR, no murmurs. Respiratory: CTAB, normal effort, No wheezes, rales or rhonchi Abdomen: Bowel sounds present, nontender, nondistended, no hepatosplenomegaly. Extremities: no edema or cyanosis. Skin: warm and dry, no rashes noted Neuro: alert, no obvious focal deficits Psych: Normal affect and mood  ASSESSMENT/PLAN:   No problem-specific Assessment & Plan notes found for this encounter.     Dr. Erick Alley, DO [facility]    {    This will disappear when note is signed, click to select method of visit    :1}

## 2024-02-06 ENCOUNTER — Telehealth: Payer: Self-pay

## 2024-02-06 NOTE — Telephone Encounter (Signed)
 Patient calls nurse line regarding CT scan. She reports that recent appointment was cancelled due to issues with insurance coverage.   Patient reports that she only has [employer].   Will forward to [facility] for further assistance.   Veronda Prude, RN

## 2024-02-11 ENCOUNTER — Ambulatory Visit: Admitting: Podiatry

## 2024-02-20 ENCOUNTER — Other Ambulatory Visit (HOSPITAL_COMMUNITY)
Admission: RE | Admit: 2024-02-20 | Discharge: 2024-02-20 | Disposition: A | Discharge status: Home / Self Care | Source: Ambulatory Visit | Attending: Family Medicine | Admitting: Family Medicine

## 2024-02-20 ENCOUNTER — Ambulatory Visit

## 2024-02-20 VITALS — BP 196/73 | HR 55 | Ht 62.0 in | Wt 240.2 lb

## 2024-02-20 DIAGNOSIS — Z124 Encounter for screening for malignant neoplasm of cervix: Secondary | ICD-10-CM

## 2024-02-20 DIAGNOSIS — B3731 Acute candidiasis of vulva and vagina: Secondary | ICD-10-CM

## 2024-02-20 DIAGNOSIS — I152 Hypertension secondary to endocrine disorders: Secondary | ICD-10-CM | POA: Diagnosis not present

## 2024-02-20 DIAGNOSIS — I10 Essential (primary) hypertension: Secondary | ICD-10-CM

## 2024-02-20 DIAGNOSIS — E1159 Type 2 diabetes mellitus with other circulatory complications: Secondary | ICD-10-CM

## 2024-02-20 DIAGNOSIS — R001 Bradycardia, unspecified: Secondary | ICD-10-CM | POA: Insufficient documentation

## 2024-02-20 DIAGNOSIS — R053 Chronic cough: Secondary | ICD-10-CM

## 2024-02-20 DIAGNOSIS — R9389 Abnormal findings on diagnostic imaging of other specified body structures: Secondary | ICD-10-CM | POA: Diagnosis not present

## 2024-02-20 MED ORDER — AMLODIPINE-OLMESARTAN 5-20 MG PO TABS
1.0000 | ORAL_TABLET | Freq: Every day | ORAL | 0 refills | Status: DC
Start: 2024-02-20 — End: 2024-03-06

## 2024-02-20 MED ORDER — FLUCONAZOLE 150 MG PO TABS: 150.0000 mg | ORAL_TABLET | Freq: Once | ORAL | 0 refills | Status: AC

## 2024-02-20 NOTE — Patient Instructions (Signed)
 It was great to see you! Thank you for allowing me to participate in your care!  I recommend that you always bring your medications to each appointment as this makes it easy to ensure you are on the correct medications and helps us  not miss when refills are needed.  Our plans for today:  - Make apt for early next week for BP check  - Start Azor daily for blood pressure - Go to ED if you have any changes in vision , chest pain, light headedness, weakness, difficulty speaking, feeling very unwell  - I will order Chest CT again  - Schedule apt to discuss diabetes and ozempic.   We are checking some labs today, I will call you if they are abnormal will send you a MyChart message or a letter if they are normal.  If you do not hear about your labs in the next 2 weeks please let us  know.  Take care and seek immediate care sooner if you develop any concerns.   Dr. Glenn Lange, DO [facility]

## 2024-02-20 NOTE — Assessment & Plan Note (Addendum)
 Chronic intermittent cough for over a year. Previous imaging showed opacity in right lower mid lung consistent with rhonchorous sounds on auscultation which I heard at previous visit a couple months ago and again today. She has been treated with antibiotics within the past year without resolution of the cough.   She has no history of smoking cigarettes but has smoked marijuana. She is not on an ACE inhibitor to explain the cough and description of creamy mucus with cough is not consistent with acid reflux induced cough.  Considered postnasal drip however this would not cause an abnormal opacity on CXR. OHS could also be playing a role in cough and SOB with activity for years.  This opacity along with chronic cough needs to be further evaluated with CT imaging of the lungs. - Chest CT without contrast ordered, will schedule after approval from insurance company

## 2024-02-20 NOTE — Assessment & Plan Note (Signed)
 Symptoms most consistent with vaginal yeast infection. Empirical antifungal treatment chosen based on symptoms. - Prescribe Diflucan (fluconazole) with instructions for a second dose in three days if symptoms persist.

## 2024-02-20 NOTE — Assessment & Plan Note (Addendum)
 Severely elevated blood pressure. Increased risk of stroke and renal damage due to persistent hypertension and has history of myocardial infarction. She has a mild headache but otherwise asymptomatic and neuroexam is reassuring. - Initiate Azor (amlodipine and olmesartan) 5-20 mg daily - Order BMP to assess renal function. - Schedule follow-up in one week for blood pressure reassessment. - Advise immediate ED visit if experiencing chest pain, weakness, aphasia, blurred vision, or syncope.

## 2024-02-20 NOTE — Assessment & Plan Note (Signed)
 She is mildly bradycardic today but asymptomatic.  On chart review, she has not been bradycardic in the past.  Will continue to monitor and consider getting EKG at future visit if symptoms develop or bradycardia persists.  She was advised to schedule appointment in 1 week.

## 2024-02-20 NOTE — Progress Notes (Cosign Needed Addendum)
 SUBJECTIVE:   CHIEF COMPLAINT / HPI:   The patient, with a history of heart attack and diabetes, presents with a high blood pressure reading. 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  Sometimes the cough is productive with creamy mucus and sometimes it dry.  She denies ever smoking cigarettes but smokes marijuana on occasion.  At previous visit, mild rhonchi was heard on right mid lobe and CXR showed right lobe opacity.    She also reports symptoms of what she thinks is a vaginal yeast infection including increased white discharge and itchiness.    She also reports a mild headache that comes and goes, which she describes as irritating but not painful.  She denies any current chest pain, shortness of breath, lightheadedness or changes in vision.  PERTINENT  PMH / PSH: CAD, T2DM, HTN, obesity  OBJECTIVE:   BP (!) 196/73   Pulse (!) 55   Ht 5\' 2"  (1.575 m)   Wt 240 lb 3.2 oz (109 kg)   LMP 01/13/2018 (LMP Unknown)   SpO2 95%   BMI 43.93 kg/m    General: NAD, pleasant, well-appearing Cardiac: Mildly bradycardic, regular rhythm Respiratory: Good air movement throughout with mild rhonchi auscultated at right mid-lower lobe.  Normal work of breathing on room air. Skin: warm and dry GU: Chaperoned by CMA mild erythema/inflammation surrounding vaginal introitus, moist pink vaginal mucosa, no notable discharge observed, atrophied cervix but normal-appearing without any lesions Neuro: alert, Cranial nerves II through XII intact, sensation intact, finger-nose-finger test normal    ASSESSMENT/PLAN:   Hypertension associated with diabetes (HCC) Severely elevated blood pressure. Increased risk of stroke and renal damage due to persistent hypertension and has history of myocardial infarction. She has a mild headache but  otherwise asymptomatic and neuroexam is reassuring. - Initiate Azor  (amlodipine  and olmesartan ) 5-20 mg daily - Order BMP to assess renal function. - Schedule follow-up in one week for blood pressure reassessment. - Advise immediate ED visit if experiencing chest pain, weakness, aphasia, blurred vision, or syncope.  Vaginal yeast infection Symptoms most consistent with vaginal yeast infection. Empirical antifungal treatment chosen based on symptoms. - Prescribe Diflucan  (fluconazole ) with instructions for a second dose in three days if symptoms persist.   Chronic cough Chronic intermittent cough for over a year. Previous imaging showed opacity in right lower mid lung consistent with rhonchorous sounds on auscultation which I heard at previous visit a couple months ago and again today. She has been treated with antibiotics within the past year without resolution of the cough.   She has no history of smoking cigarettes but has smoked marijuana. She is not on an ACE inhibitor to explain the cough and description of creamy mucus with cough is not consistent with acid reflux induced cough.  Considered postnasal drip however this would not cause an abnormal opacity on CXR. OHS could also be playing a role in cough and SOB with activity for years.  This opacity along with chronic cough needs to be further evaluated with CT imaging of the lungs. - Chest CT without contrast ordered, will schedule after approval from insurance company    Bradycardia She is mildly bradycardic today but asymptomatic.  On chart review, she has not been bradycardic in the past.  Will continue to monitor and consider getting EKG  at future visit if symptoms develop or bradycardia persists.  She was advised to schedule appointment in 1 week.  Type 2 diabetes mellitus with other circulatory complications (HCC) Last A1c 7.5 2 months ago.  Rx metformin  500 mg BID - titrate up to 1000 mg BID  Check next A1c in about 3 months      Plan to discuss Wegovy  for weight loss at future visit   Health maintenance Pap smear completed today Need to discuss other health maintenance items at future visit including colonoscopy, mammogram, vaccines  Dr. Glenn Lange, DO [facility]

## 2024-02-21 ENCOUNTER — Encounter: Payer: Self-pay | Admitting: Student

## 2024-02-21 LAB — BASIC METABOLIC PANEL WITH GFR
BUN/Creatinine Ratio: 13 (ref 9–23)
BUN: 10 mg/dL (ref 6–24)
CO2: 25 mmol/L (ref 20–29)
Calcium: 9.8 mg/dL (ref 8.7–10.2)
Chloride: 102 mmol/L (ref 96–106)
Creatinine, Ser: 0.79 mg/dL (ref 0.57–1.00)
Glucose: 272 mg/dL — ABNORMAL HIGH (ref 70–99)
Potassium: 4.2 mmol/L (ref 3.5–5.2)
Sodium: 141 mmol/L (ref 134–144)
eGFR: 88 mL/min/{1.73_m2} (ref >59)

## 2024-02-21 MED ORDER — METFORMIN HCL ER 500 MG PO TB24
500.0000 mg | ORAL_TABLET | Freq: Two times a day (BID) | ORAL | 3 refills | Status: DC
Start: 2024-02-21 — End: 2024-03-27

## 2024-02-21 NOTE — Assessment & Plan Note (Signed)
 Last A1c 7.5 2 months ago.  Rx metformin  500 mg BID - titrate up to 1000 mg BID  Check next A1c in about 3 months

## 2024-02-21 NOTE — Addendum Note (Signed)
 Addended by: Glenn Lange K on: 02/21/2024 08:30 AM   Modules accepted: Orders

## 2024-02-25 ENCOUNTER — Ambulatory Visit: Admitting: Student

## 2024-02-26 ENCOUNTER — Encounter: Payer: Self-pay | Admitting: Student

## 2024-02-26 LAB — CYTOLOGY - PAP
Comment: NEGATIVE
Diagnosis: NEGATIVE
Diagnosis: REACTIVE
High risk HPV: NEGATIVE

## 2024-02-28 ENCOUNTER — Telehealth: Payer: Self-pay

## 2024-02-28 NOTE — Telephone Encounter (Signed)
-----   Message from [facility] Genevia Kern S sent at 02/27/2024 12:22 PM EDT ----- Please schedule CT at [facility].  Thanks Clovis Dar

## 2024-02-28 NOTE — Telephone Encounter (Signed)
 Patient is scheduled for CT on 05/01 @1030 

## 2024-02-29 ENCOUNTER — Other Ambulatory Visit: Payer: Self-pay | Admitting: Family Medicine

## 2024-02-29 DIAGNOSIS — E1140 Type 2 diabetes mellitus with diabetic neuropathy, unspecified: Secondary | ICD-10-CM

## 2024-03-05 ENCOUNTER — Ambulatory Visit (HOSPITAL_COMMUNITY)
Admission: RE | Admit: 2024-03-05 | Discharge: 2024-03-05 | Disposition: A | Discharge status: Home / Self Care | Source: Ambulatory Visit | Attending: Family Medicine | Admitting: Family Medicine

## 2024-03-05 DIAGNOSIS — R053 Chronic cough: Secondary | ICD-10-CM | POA: Diagnosis present

## 2024-03-05 DIAGNOSIS — R9389 Abnormal findings on diagnostic imaging of other specified body structures: Secondary | ICD-10-CM | POA: Diagnosis present

## 2024-03-06 ENCOUNTER — Ambulatory Visit (INDEPENDENT_AMBULATORY_CARE_PROVIDER_SITE_OTHER): Admitting: Student

## 2024-03-06 VITALS — BP 122/72 | HR 81 | Wt 241.4 lb

## 2024-03-06 DIAGNOSIS — I152 Hypertension secondary to endocrine disorders: Secondary | ICD-10-CM

## 2024-03-06 DIAGNOSIS — E1159 Type 2 diabetes mellitus with other circulatory complications: Secondary | ICD-10-CM

## 2024-03-06 DIAGNOSIS — Z1211 Encounter for screening for malignant neoplasm of colon: Secondary | ICD-10-CM | POA: Diagnosis not present

## 2024-03-06 DIAGNOSIS — Z1231 Encounter for screening mammogram for malignant neoplasm of breast: Secondary | ICD-10-CM

## 2024-03-06 DIAGNOSIS — K5900 Constipation, unspecified: Secondary | ICD-10-CM

## 2024-03-06 DIAGNOSIS — I10 Essential (primary) hypertension: Secondary | ICD-10-CM | POA: Diagnosis not present

## 2024-03-06 LAB — POCT GLYCOSYLATED HEMOGLOBIN (HGB A1C): HbA1c, POC (controlled diabetic range): 9.4 % — AB (ref 0.0–7.0)

## 2024-03-06 MED ORDER — SEMAGLUTIDE-WEIGHT MANAGEMENT 0.25 MG/0.5ML ~~LOC~~ SOAJ: 0.2500 mg | SUBCUTANEOUS | 0 refills | Status: DC

## 2024-03-06 MED ORDER — SENNA 8.6 MG PO TABS: 2.0000 | ORAL_TABLET | Freq: Every day | ORAL | 0 refills | Status: AC

## 2024-03-06 MED ORDER — AMLODIPINE-OLMESARTAN 5-20 MG PO TABS
1.0000 | ORAL_TABLET | Freq: Every day | ORAL | 0 refills | Status: DC
Start: 2024-03-06 — End: 2024-06-15

## 2024-03-06 NOTE — Progress Notes (Signed)
    SUBJECTIVE:   CHIEF COMPLAINT / HPI:   The following goals were made at previous appointment: Walk 3x/week for 20 minutes and limit sweets to no more than 2x/day Today patient states she has been able to walk 2x/week and is able to limit sweets to 2x/day except for the weekends when she can go through a bag of jolley ranchers a day.  She would like to start Wegovy  to assist in weight loss, no history thyroid cancer  For diabetes, she has been taking metformin  500 mg twice daily, has not yet increased to the full max dose of 1000 mg twice daily.  She describes chronic constipation over the last several years requiring disimpaction in the ED.  States she has bowel movements at most once a week and has to strain.  Has been using MiraLAX  a couple times a day with no improvement.  Denies any current abdominal pain.  PERTINENT  PMH / PSH: Obesity, HTN, T2DM  OBJECTIVE:   BP 122/72   Pulse 81   Wt 241 lb 6.4 oz (109.5 kg)   LMP 01/13/2018 (LMP Unknown)   SpO2 96%   BMI 44.15 kg/m    General: NAD, pleasant, able to participate in exam Cardiac: RRR, no murmurs. Respiratory: Crackles/rhonchi sound in right lower lobe, otherwise CTAB with normal effort Abdomen:  nontender, nondistended, soft Extremities: no edema or cyanosis. Skin: warm and dry, no rashes noted Neuro: alert, no obvious focal deficits Psych: Normal affect and mood  ASSESSMENT/PLAN:   Type 2 diabetes mellitus with other circulatory complications (HCC) A1c at 9.4%. Initiated semaglutide  for weight management which will also help with glycemic control. No contraindications noted. - Increase metformin  to 1000 mg twice daily  - Start semaglutide  (Wegovy ) at 0.25 mg weekly, titrate to 0.5 mg after four weeks if tolerated. - Order microalbumin creatinine ratio to monitor kidney function.    Hypertension associated with diabetes (HCC) Well-controlled after starting Azor  5-20 mg at last visit - BMP today and continue  Azor   Morbid (severe) obesity due to excess calories (HCC) Will continue to manage with lifestyle modifications and add Wegovy . - Prescribe semaglutide  (Wegovy ) for obesity management.  Start at 0.25 mg weekly -Follow-up in 3 weeks, if tolerating well increase dose to 0.5 mg weekly - Encourage walking three times a week for 20 minutes. - Advise limiting sweets to twice a day and substituting fruits for sweets.  Constipation Chronic constipation with infrequent bowel movements. History of disimpaction. Current Miralax  use alone is ineffective.  Reassuringly, no abdominal pain and benign abdominal exam today. - Add Senna, two tablets once daily, in addition to MiraLAX , 1 capful twice daily - Schedule follow-up to discuss constipation management further.     Dr. Glenn Lange, DO [facility]

## 2024-03-06 NOTE — Assessment & Plan Note (Addendum)
 Will continue to manage with lifestyle modifications and add Wegovy . - Prescribe semaglutide  (Wegovy ) for obesity management.  Start at 0.25 mg weekly -Follow-up in 3 weeks, if tolerating well increase dose to 0.5 mg weekly - Encourage walking three times a week for 20 minutes. - Advise limiting sweets to twice a day and substituting fruits for sweets.

## 2024-03-06 NOTE — Assessment & Plan Note (Signed)
 Chronic constipation with infrequent bowel movements. History of disimpaction. Current Miralax  use alone is ineffective.  Reassuringly, no abdominal pain and benign abdominal exam today. - Add Senna, two tablets once daily, in addition to MiraLAX , 1 capful twice daily - Schedule follow-up to discuss constipation management further.

## 2024-03-06 NOTE — Assessment & Plan Note (Signed)
 A1c at 9.4%. Initiated semaglutide  for weight management which will also help with glycemic control. No contraindications noted. - Increase metformin  to 1000 mg twice daily  - Start semaglutide  (Wegovy ) at 0.25 mg weekly, titrate to 0.5 mg after four weeks if tolerated. - Order microalbumin creatinine ratio to monitor kidney function.

## 2024-03-06 NOTE — Patient Instructions (Addendum)
 It was great to see you! Thank you for allowing me to participate in your care!  I recommend that you always bring your medications to each appointment as this makes it easy to ensure you are on the correct medications and helps us  not miss when refills are needed.  Our plans for today:  - Increase metformin  to 2 tablets twice daily   - Start Wegovy  (semaglutide ) 0.25 mg injected weekly.  - call breast center to schedule mammogram  - cologaurd will be mailed to your home  - return in 3 weeks for f/u or sooner if needed - goals: Walk 3x/week for 20 minutes and limit sweets to no more than 2x/day and try substituting fruit for sweets  - for constipation, take a capful of miralax  twice daily and Senna 2 tablets once daily.  We are checking some labs today, I will call you if they are abnormal will send you a MyChart message or a letter if they are normal.  If you do not hear about your labs in the next 2 weeks please let us  know.  Take care and seek immediate care sooner if you develop any concerns.   Dr. Glenn Lange, DO [facility]

## 2024-03-06 NOTE — Assessment & Plan Note (Signed)
 Well-controlled after starting Azor  5-20 mg at last visit - BMP today and continue Azor 

## 2024-03-07 LAB — BASIC METABOLIC PANEL WITH GFR
BUN/Creatinine Ratio: 23 (ref 9–23)
BUN: 19 mg/dL (ref 6–24)
CO2: 24 mmol/L (ref 20–29)
Calcium: 9.9 mg/dL (ref 8.7–10.2)
Chloride: 103 mmol/L (ref 96–106)
Creatinine, Ser: 0.83 mg/dL (ref 0.57–1.00)
Glucose: 208 mg/dL — ABNORMAL HIGH (ref 70–99)
Potassium: 4.4 mmol/L (ref 3.5–5.2)
Sodium: 140 mmol/L (ref 134–144)
eGFR: 83 mL/min/{1.73_m2} (ref >59)

## 2024-03-08 LAB — MICROALBUMIN / CREATININE URINE RATIO
Creatinine, Urine: 80.4 mg/dL
Microalb/Creat Ratio: 6 mg/g{creat} (ref 0–29)
Microalbumin, Urine: 4.5 ug/mL

## 2024-03-09 ENCOUNTER — Encounter: Payer: Self-pay | Admitting: Student

## 2024-03-11 ENCOUNTER — Telehealth: Payer: Self-pay | Admitting: Student

## 2024-03-11 DIAGNOSIS — I2510 Atherosclerotic heart disease of native coronary artery without angina pectoris: Secondary | ICD-10-CM

## 2024-03-11 DIAGNOSIS — R053 Chronic cough: Secondary | ICD-10-CM

## 2024-03-11 DIAGNOSIS — R9389 Abnormal findings on diagnostic imaging of other specified body structures: Secondary | ICD-10-CM

## 2024-03-11 NOTE — Telephone Encounter (Signed)
 Called patient to discuss recent chest CT showing diffuse ground glass opacities. Only symptom for years is a chronic occasional and sometimes productive cough.  I have lower suspicion that this is an infectious process given limited symptoms.  I do recommend referral to pulmonology for further evaluation.   CT also noted cardiomegaly.  She does have significant cardiac history including MI.  Was lost to follow-up with cardiology.  Their last note from 2020 recommended she follow-up in 1 year.  I will place referral for cardiology to reestablish care.    While on the phone, patient notes her constipation has gotten much worse since our last visit, MiraLAX  and senna are not helping.  She bought an enema at the store yesterday and says that it helped a little but she is still in pain and only passed a very small amount of hard stool today.  Appointment made with access to care for tomorrow afternoon for further evaluation of constipation

## 2024-03-12 ENCOUNTER — Ambulatory Visit: Payer: Self-pay

## 2024-03-12 NOTE — Progress Notes (Deleted)
    SUBJECTIVE:   CHIEF COMPLAINT / HPI:   Constipation Longstanding history over several years. BM at most once per week. Hx of requiring manual disimpaction in the ED. On Miralax  and Senna. Also trialed an enema at home.  Never had a colnoscopy. Cologuard ordered by Dr. Rochelle Chu   PERTINENT  PMH / PSH: ***  OBJECTIVE:   LMP 01/13/2018 (LMP Unknown)   ***  ASSESSMENT/PLAN:   Assessment & Plan      Alexa Andrews, MD [facility]

## 2024-03-13 LAB — COLOGUARD: Cologuard: NEGATIVE

## 2024-03-20 ENCOUNTER — Ambulatory Visit: Payer: Self-pay | Admitting: Student

## 2024-03-20 ENCOUNTER — Encounter: Payer: Self-pay | Admitting: Family Medicine

## 2024-03-20 ENCOUNTER — Other Ambulatory Visit: Payer: Self-pay | Admitting: Family Medicine

## 2024-03-20 LAB — COLOGUARD: COLOGUARD: NEGATIVE

## 2024-03-27 ENCOUNTER — Encounter: Payer: Self-pay | Admitting: Student

## 2024-03-27 ENCOUNTER — Telehealth: Payer: Self-pay

## 2024-03-27 ENCOUNTER — Ambulatory Visit: Admitting: Student

## 2024-03-27 DIAGNOSIS — E1159 Type 2 diabetes mellitus with other circulatory complications: Secondary | ICD-10-CM

## 2024-03-27 DIAGNOSIS — K5900 Constipation, unspecified: Secondary | ICD-10-CM

## 2024-03-27 DIAGNOSIS — E1140 Type 2 diabetes mellitus with diabetic neuropathy, unspecified: Secondary | ICD-10-CM | POA: Diagnosis not present

## 2024-03-27 DIAGNOSIS — G629 Polyneuropathy, unspecified: Secondary | ICD-10-CM | POA: Diagnosis not present

## 2024-03-27 DIAGNOSIS — B3731 Acute candidiasis of vulva and vagina: Secondary | ICD-10-CM

## 2024-03-27 MED ORDER — METFORMIN HCL ER 500 MG PO TB24
1000.0000 mg | ORAL_TABLET | Freq: Two times a day (BID) | ORAL | Status: DC
Start: 2024-03-27 — End: 2024-06-15

## 2024-03-27 MED ORDER — FLUCONAZOLE 150 MG PO TABS: 150.0000 mg | ORAL_TABLET | Freq: Once | ORAL | 0 refills | Status: AC

## 2024-03-27 MED ORDER — GABAPENTIN 600 MG PO TABS: 1200.0000 mg | ORAL_TABLET | Freq: Three times a day (TID) | ORAL | 5 refills | Status: DC

## 2024-03-27 MED ORDER — EMPAGLIFLOZIN 10 MG PO TABS: 10.0000 mg | ORAL_TABLET | Freq: Every day | ORAL | Status: DC

## 2024-03-27 MED ORDER — SEMAGLUTIDE-WEIGHT MANAGEMENT 0.5 MG/0.5ML ~~LOC~~ SOAJ: 0.5000 mg | SUBCUTANEOUS | 0 refills | Status: DC

## 2024-03-27 MED ORDER — POLYETHYLENE GLYCOL 3350 17 GM/SCOOP PO POWD: 17.0000 g | Freq: Every day | ORAL | Status: AC

## 2024-03-27 MED ORDER — FLUCONAZOLE 150 MG PO TABS: 150.0000 mg | ORAL_TABLET | Freq: Once | ORAL | 0 refills | Status: DC

## 2024-03-27 NOTE — Assessment & Plan Note (Signed)
 Symptoms of recurrent vaginal yeast infection, likely associated with increased glucose levels on Jardiance . - Rx fluconazole , extra tablets provided to have on hand as needed

## 2024-03-27 NOTE — Assessment & Plan Note (Signed)
 Tolerating Wegovy  0.25 mg dose well - Increase Wegovy  to 0.5 mg weekly - Encourage increased protein intake and healthy snacks - Continue walking three times per week for at least 20 minutes.

## 2024-03-27 NOTE — Patient Instructions (Addendum)
 It was great to see you! Thank you for allowing me to participate in your care!  I recommend that you always bring your medications to each appointment as this makes it easy to ensure you are on the correct medications and helps us  not miss when refills are needed.  Our plans for today:  - See diabetic diet hand out - Increase protein in diet as we discussed ([place] cheese roll up, nuts, lean meats like chicken and fish, tofu) and fiber in veggies and whole fruits - Increased dose of Wegovy  sent to pharmacy (0.5 mg weekly) - Continue walking 3x/week for at least 20 minutes  - I will look into cardiology referral  - Return for follow up in ~ 2weeks   Take care and seek immediate care sooner if you develop any concerns.   Dr. Glenn Lange, DO [facility]

## 2024-03-27 NOTE — Telephone Encounter (Signed)
 [facility] calls nurse line in regards to Fluconazole  prescription.   She reports #20 quantity does not match the sig of "one dose."   She reports its also very "usual" to have that many called in.   She asks we resend the prescription with sig matching quantity use.   She reports they will discard the current prescription and wait for the updated one.   Will forward to provider who saw patient.

## 2024-03-27 NOTE — Progress Notes (Signed)
    SUBJECTIVE:   CHIEF COMPLAINT / HPI:   59 [name] is a 58 year old female with constipation and diabetes who presents for follow-up on bowel movements, diabetes management and obesity  She has significant improvement in constipation with one to two bowel movements daily, managed with Senna and Miralax .  She is now currently taking 2 tablets of senna daily and MiraLAX  only weekly.  She was started on Wegovy  0.25 mg recently for weight loss, which she tolerates well. She has lost five pounds in the past month and is walking three times a week, and eating less.   She mentions taking Jardiance  along with Wegovy  and metformin  for diabetes, though it is not listed in her current medications. She has a history of yeast infections, recently resolved with medication but has returned with vaginal irritation and thick white discharge.  She works at [employer] and is incorporating more vegan foods into her diet.   PERTINENT  PMH / PSH: T2DM, obesity, CAD, HTN, OSA, constipation  OBJECTIVE:   BP 102/62   Pulse 60   Ht 5\' 2"  (1.575 m)   Wt 236 lb 9.6 oz (107.3 kg)   LMP 01/13/2018 (LMP Unknown)   SpO2 98%   BMI 43.27 kg/m    General: NAD, pleasant, well-appearing Cardiac: Well-perfused Respiratory: Normal effort Neuro: alert, no obvious focal deficits Psych: Normal affect and mood  ASSESSMENT/PLAN:   Type 2 diabetes mellitus with other circulatory complications (HCC) Suboptimal glycemic control with A1c at 9.0 earlier this month.  Discussed potential yeast infections with Jardiance  however patient states she prefers to stay on the Jardiance  and treat yeast infections.  -Emphasized dietary changes. - Increase Ozempic  to 0.5 mg weekly  -Continue metformin  1000 mg twice daily -Jardiance  10 mg daily added back to med list  - Recheck A1c in August.  - Provide diabetic diet handout.    Morbid (severe) obesity due to excess calories (HCC) Tolerating Wegovy  0.25 mg dose  well - Increase Wegovy  to 0.5 mg weekly - Encourage increased protein intake and healthy snacks - Continue walking three times per week for at least 20 minutes.   Constipation Improved with Senna and Miralax . Discussed tapering medications and dietary fiber for bowel regularity.  - Continue Senna, consider reducing to one tablet daily if bowel movements become too frequent.  - Continue Miralax  as needed - Encourage dietary changes to increase fiber intake.    Vaginal yeast infection Symptoms of recurrent vaginal yeast infection, likely associated with increased glucose levels on Jardiance . - Rx fluconazole , extra tablets provided to have on hand as needed    Dr. Glenn Lange, DO [facility]

## 2024-03-27 NOTE — Assessment & Plan Note (Signed)
 Improved with Senna and Miralax . Discussed tapering medications and dietary fiber for bowel regularity.  - Continue Senna, consider reducing to one tablet daily if bowel movements become too frequent.  - Continue Miralax  as needed - Encourage dietary changes to increase fiber intake.

## 2024-03-27 NOTE — Addendum Note (Signed)
 Addended by: Sidonie Drape on: 03/27/2024 07:06 PM   Modules accepted: Orders

## 2024-03-27 NOTE — Assessment & Plan Note (Signed)
 Suboptimal glycemic control with A1c at 9.0 earlier this month.  Discussed potential yeast infections with Jardiance  however patient states she prefers to stay on the Jardiance  and treat yeast infections.  -Emphasized dietary changes. - Increase Ozempic  to 0.5 mg weekly  -Continue metformin  1000 mg twice daily -Jardiance  10 mg daily added back to med list  - Recheck A1c in August.  - Provide diabetic diet handout.

## 2024-04-03 ENCOUNTER — Ambulatory Visit: Payer: No Typology Code available for payment source | Admitting: Podiatry

## 2024-04-03 ENCOUNTER — Encounter: Payer: Self-pay | Admitting: Podiatry

## 2024-04-03 DIAGNOSIS — M79674 Pain in right toe(s): Secondary | ICD-10-CM

## 2024-04-03 DIAGNOSIS — E1159 Type 2 diabetes mellitus with other circulatory complications: Secondary | ICD-10-CM | POA: Diagnosis not present

## 2024-04-03 DIAGNOSIS — B351 Tinea unguium: Secondary | ICD-10-CM | POA: Diagnosis not present

## 2024-04-03 DIAGNOSIS — M79675 Pain in left toe(s): Secondary | ICD-10-CM

## 2024-04-03 NOTE — Progress Notes (Signed)
 This patient returns to my office for at risk foot care.  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 This patient is unable to cut nails herself since the patient cannot reach her nails.These nails are painful walking and wearing shoes.  This patient presents for at risk foot care today.  General Appearance  Alert, conversant and in no acute stress.  Vascular  Dorsalis pedis and posterior tibial  pulses are palpable  bilaterally.  Capillary return is within normal limits  bilaterally. Temperature is within normal limits  bilaterally.  Neurologic  Senn-Weinstein monofilament wire test absent  bilaterally. Muscle power within normal limits bilaterally.  Nails Thick disfigured discolored nails with subungual debris  from hallux to fifth toes bilaterally. No evidence of bacterial infection or drainage bilaterally.  Orthopedic  No limitations of motion  feet .  No crepitus or effusions noted.  Amputation third toe left foot. Thickening second toe left foot.  Skin  normotropic skin with no porokeratosis noted bilaterally.  No signs of infections or ulcers noted.     Onychomycosis  Pain in right toes  Pain in left toes  Consent was obtained for treatment procedures.   Mechanical debridement of nails 1-5  bilaterally performed with a nail nipper.  Filed with dremel without incident.    Return office visit   3 months                   Told patient to return for periodic foot care and evaluation due to potential at risk complications.   Ruffin Cotton DPM

## 2024-04-07 ENCOUNTER — Other Ambulatory Visit (HOSPITAL_COMMUNITY): Payer: Self-pay

## 2024-04-07 ENCOUNTER — Telehealth: Payer: Self-pay

## 2024-04-07 NOTE — Telephone Encounter (Signed)
 Pharmacy Patient Advocate Encounter   Received notification from CoverMyMeds that prior authorization for WEGOVY  0.5MG  is required/requested.   Insurance verification completed.   The patient is insured through [facility] [facility] .   PA required; PA submitted to above mentioned insurance via CoverMyMeds Key/confirmation #/[facility]. Status is pending

## 2024-04-07 NOTE — Telephone Encounter (Signed)
 Pharmacy Patient Advocate Encounter  Received notification from [facility] that Prior Authorization for WEGOVY  0.5MG  has been DENIED.  Full denial letter will be uploaded to the media tab. See denial reason below.  We may be able to approve this drug in a certain situation (when you will not use the requested drug in combination with another glucagon-like peptide 1 [GLP-1] receptor agonist agent). We do not see that this applies to you.   Per medicaid healthy blue, patient recently picked up Ozempic . Rep was unable to tell the claim date that med was processed. Ozempic  was discontinued 03/27/24, however chart note from that date state patient is to take Ozempic  0.5mg  for diabetes, and Wegovy  0.5mg  for Obesity. Please clarify. An appeal may need to be done by provider for coverage.  PA #/Case ID/Reference #: [id number]

## 2024-04-09 ENCOUNTER — Other Ambulatory Visit (HOSPITAL_COMMUNITY): Payer: Self-pay

## 2024-04-09 ENCOUNTER — Other Ambulatory Visit: Payer: Self-pay | Admitting: Student

## 2024-04-09 MED ORDER — SEMAGLUTIDE-WEIGHT MANAGEMENT 0.5 MG/0.5ML ~~LOC~~ SOAJ: 0.5000 mg | SUBCUTANEOUS | 0 refills | Status: DC

## 2024-04-09 NOTE — Progress Notes (Signed)
 There has been confusion with insurance about Ozempic  vs wegovy  for this pt.   On 12/12/2023 I prescribed Ozempic  for the first time. It was denied by insurance as pt had not been trialed on Metformin .  I then started pt on Metformin .   On 03/06/24, I officially discontinued the previous order for ozempic  that had been declined by insurance. I then placed an order for Wegovy  for weight loss that same day.  However, pt was dispensed Ozempic  instead of Wegovy  (unclear why?)  On 03/27/24, Pt came in for follow up visit and I placed an order to increase Wegovy  to the 0.5 dosage as I was not aware that Ozempic  had been prescribed previously. In my 5/23 notes, I accidentally referred to the wegovy  as ozempic  when it discussed it under the A&P for T2DM.   I have tried to call insurance for peer to peer multiple times to clarify this issue but am unable to get a hold of anyone.   Will attempt to prescribe Wegovy  for weight loss again at next dose of 0.5 mg weekly which should be covered by medicaid. Pt is also implementing lifestyle changes including decreased intake of sugary food, increase protein and veggies in diet and walking 3x/week for 20 minutes at a time.

## 2024-04-10 NOTE — Telephone Encounter (Signed)
 Pharmacy Patient Advocate Encounter  Received notification from [facility] that Prior Authorization for WEGOVY  0.5MG  has been APPROVED from 04/09/24 to 10/06/24  All is good!

## 2024-04-16 ENCOUNTER — Ambulatory Visit: Admitting: Student

## 2024-04-16 NOTE — Progress Notes (Deleted)
    SUBJECTIVE:   CHIEF COMPLAINT / HPI:   *** Pt had positive hep C ab in 2/25 with HCV not detected on PCR (false pos vs cleared infection)  PERTINENT  PMH / PSH: ***  OBJECTIVE:   LMP 01/13/2018 (LMP Unknown)  ***  General: NAD, pleasant, able to participate in exam Cardiac: RRR, no murmurs. Respiratory: CTAB, normal effort, No wheezes, rales or rhonchi Abdomen: Bowel sounds present, nontender, nondistended, no hepatosplenomegaly. Extremities: no edema or cyanosis. Skin: warm and dry, no rashes noted Neuro: alert, no obvious focal deficits Psych: Normal affect and mood  ASSESSMENT/PLAN:   No problem-specific Assessment & Plan notes found for this encounter.     Dr. Glenn Lange, DO [facility]    {    This will disappear when note is signed, click to select method of visit    :1}

## 2024-04-23 ENCOUNTER — Ambulatory Visit: Admitting: Student

## 2024-04-23 ENCOUNTER — Ambulatory Visit
Admission: RE | Admit: 2024-04-23 | Discharge: 2024-04-23 | Disposition: A | Discharge status: Home / Self Care | Source: Ambulatory Visit | Attending: Family Medicine

## 2024-04-23 ENCOUNTER — Ambulatory Visit (HOSPITAL_COMMUNITY)
Admission: RE | Admit: 2024-04-23 | Discharge: 2024-04-23 | Disposition: A | Discharge status: Home / Self Care | Source: Ambulatory Visit | Attending: Family Medicine | Admitting: Family Medicine

## 2024-04-23 ENCOUNTER — Encounter: Payer: Self-pay | Admitting: Student

## 2024-04-23 ENCOUNTER — Ambulatory Visit

## 2024-04-23 VITALS — BP 138/63 | HR 50 | Ht 62.0 in | Wt 233.6 lb

## 2024-04-23 DIAGNOSIS — M25562 Pain in left knee: Secondary | ICD-10-CM | POA: Diagnosis present

## 2024-04-23 DIAGNOSIS — Z8619 Personal history of other infectious and parasitic diseases: Secondary | ICD-10-CM | POA: Insufficient documentation

## 2024-04-23 DIAGNOSIS — W19XXXA Unspecified fall, initial encounter: Secondary | ICD-10-CM | POA: Insufficient documentation

## 2024-04-23 DIAGNOSIS — E1159 Type 2 diabetes mellitus with other circulatory complications: Secondary | ICD-10-CM | POA: Diagnosis not present

## 2024-04-23 DIAGNOSIS — R001 Bradycardia, unspecified: Secondary | ICD-10-CM | POA: Insufficient documentation

## 2024-04-23 MED ORDER — EMPAGLIFLOZIN 10 MG PO TABS: 10.0000 mg | ORAL_TABLET | Freq: Every day | ORAL | 0 refills | Status: DC

## 2024-04-23 MED ORDER — SEMAGLUTIDE-WEIGHT MANAGEMENT 0.5 MG/0.5ML ~~LOC~~ SOAJ
0.5000 mg | SUBCUTANEOUS | 0 refills | Status: DC
Start: 2024-04-23 — End: 2024-05-28

## 2024-04-23 NOTE — Assessment & Plan Note (Signed)
 Heart rate in the 50s, asymptomatic. EKG shows sinus bradycardia. Follow-up with cardiology advised due to cardiac history.  She was referred at a previous visit. - Instruct to seek emergency care if symptoms like lightheadedness, dyspnea, or chest pain develop. - Advise to make an appointment with a cardiologist for further evaluation and management.

## 2024-04-23 NOTE — Progress Notes (Signed)
 SUBJECTIVE:   CHIEF COMPLAINT / HPI:   [name] is a 58 year old female who presents with a recent fall and balance issues.  She experienced a fall over a week ago at work, landing on her left knee and right elbow. She attributes the fall to not picking her foot up enough while walking, causing her to trip. No head injury occurred. Only her knee is sore, she has not taken any medication for the pain. She continues to work despite the pain and has not sought medical attention until now.  She describes a balance issue persisting for several months, characterized by veering to the right while walking, which has been noticed by her boss. No dizziness, headaches, tinnitus, vision changes, chest pain, shortness of breath or hearing difficulties. She corrects herself when she notices the veering.  Recent tests showed antibodies for hepatitis C, but the PCR test for viral load was negative. She states she has a history of hepatitis C, treated in 2015, and cleared the virus.   She has been using Wegovy  for weight management, having completed four injections of the initial dose which she tolerated well and would like to increase to the 0.5 mg dosing which was previously sent to pharmacy however, due to complications with insurance she has been able to pick it up yet.  PERTINENT  PMH / PSH: CAD with history MI, obesity, T2DM, HLD, OSA, HTN  OBJECTIVE:   BP 138/63   Pulse (!) 50   Ht 5' 2 (1.575 m)   Wt 233 lb 9.6 oz (106 kg)   LMP 01/13/2018 (LMP Unknown)   SpO2 97%   BMI 42.73 kg/m    General: NAD, pleasant, well-appearing Cardiac: Bradycardic, regular rhythm Respiratory: Normal effort Extremities: Ecchymoses surrounding left knee without any swelling or deformity.  Mild TTP of anterior patella.  5/5 muscle strength of BLEs with knee flexion and extension, hip adduction, abduction, flexion and extension.  Good ROM with knee flexion and extension but does have mild pain with full  extension Neuro: alert, cranial nerves II through XII intact, sensation intact, finger-nose-finger test normal Psych: Normal affect and mood  ASSESSMENT/PLAN:   Fall Unclear if this was due to to balance issues versus just tripping.  She does admit to veering to the right when walking. No associated symptoms or vestibular issues concerning for BPPV, Mnire's disease or stroke and neuroexam reassuring. Physical therapy recommended for muscle strength and balance improvement to prevent future falls/veering. - Refer to physical therapy for balance and strength training.  Acute pain of left knee Pain likely due to soft tissue injury or possible patellar fracture after the fall. X-ray advised to rule out fracture. - Order x-ray of the left knee to rule out fracture. - Recommend over-the-counter Tylenol  for pain management. - Advise use of ice for pain relief if beneficial.  Bradycardia Heart rate in the 50s, asymptomatic. EKG shows sinus bradycardia. Follow-up with cardiology advised due to cardiac history.  She was referred at a previous visit. - Instruct to seek emergency care if symptoms like lightheadedness, dyspnea, or chest pain develop. - Advise to make an appointment with a cardiologist for further evaluation and management.  Morbid (severe) obesity due to excess calories [facility]) Patient is ready to increase to 0.5 mg dosage of Wegovy  to aid in weight loss.  Advised patient that insurance recently did approve the increased dosage. - Encourage increased protein intake and healthy snacks - Continue walking three times per week for  at least 20 minutes.  - Refilled Wegovy  0.5 mg   Jardiance  refilled for T2DM Patient advised to return late July/early August to recheck A1c   Dr. Glenn Lange, DO [facility]

## 2024-04-23 NOTE — Assessment & Plan Note (Addendum)
 Unclear if this was due to to balance issues versus just tripping.  She does admit to veering to the right when walking. No associated symptoms or vestibular issues concerning for BPPV, Mnire's disease or stroke and neuroexam reassuring. Physical therapy recommended for muscle strength and balance improvement to prevent future falls/veering. - Refer to physical therapy for balance and strength training.

## 2024-04-23 NOTE — Patient Instructions (Addendum)
 It was great to see you! Thank you for allowing me to participate in your care!  I recommend that you always bring your medications to each appointment as this makes it easy to ensure you are on the correct medications and helps us  not miss when refills are needed.  Our plans for today:  - make apt with cardiology: [facility] [address] [place], [place] [address] [phone] - If you develop any lightheadedness, shortness of breath, chest pain, immediately call 911-go to the emergency department - Go to [address] to have x-rays done of your left knee to look for signs of fracture.  You can apply ice, take Tylenol  for pain - You will be contacted to schedule appointment with PT for balance issues - Return late July/early August to recheck A1c  We are checking some labs today, I will call you if they are abnormal will send you a MyChart message or a letter if they are normal.  If you do not hear about your labs in the next 2 weeks please let us  know.  Take care and seek immediate care sooner if you develop any concerns.   Dr. Glenn Lange, DO [facility]

## 2024-04-23 NOTE — Assessment & Plan Note (Signed)
 Patient is ready to increase to 0.5 mg dosage of Wegovy  to aid in weight loss.  Advised patient that insurance recently did approve the increased dosage. - Encourage increased protein intake and healthy snacks - Continue walking three times per week for at least 20 minutes.  - Refilled Wegovy  0.5 mg

## 2024-04-23 NOTE — Assessment & Plan Note (Signed)
 Pain likely due to soft tissue injury or possible patellar fracture after the fall. X-ray advised to rule out fracture. - Order x-ray of the left knee to rule out fracture. - Recommend over-the-counter Tylenol  for pain management. - Advise use of ice for pain relief if beneficial.

## 2024-04-24 ENCOUNTER — Ambulatory Visit: Payer: Self-pay | Admitting: Student

## 2024-04-27 ENCOUNTER — Ambulatory Visit

## 2024-05-01 ENCOUNTER — Other Ambulatory Visit: Payer: Self-pay | Admitting: Student

## 2024-05-06 ENCOUNTER — Ambulatory Visit

## 2024-05-28 ENCOUNTER — Other Ambulatory Visit: Payer: Self-pay | Admitting: Family Medicine

## 2024-06-07 NOTE — Progress Notes (Deleted)
 Subjective:     Patient ID: [name], female   DOB: 05-25-1966,    MRN: [id number]  HPI  19 yowf never regular smoker s/p admit for acute MI >  discharged  4/14 after being intubated for MI from LAD/diagonal blockage required stent and balloon angioplasty. She was extubated and on day of discharge she noted noisy breathing (documented in cards note) but was discharged to home. She reports her noisy breathing became unbearable and she presented 02/19/16  to ER\ with stridor. ENT Scoped her without obvious injury. Please note she has documented OSA with broken cpap machine x 3 years. Recent intubation, untreated OSA and ACE-I  Considered  contributing factors to stridor rec  PPI bid, steroids > reintubated   ETT- 4/16 > ext 4/20  And signed off by pulmonary 02/25/16   03/09/2016  Extended  Post hosp f/u ov/Ivin Rosenbloom re: last ACEi 02/19/16  Chief Complaint  Patient presents with   Follow-up    seen in the hospital for her breathing issues.  still concerned about her breathing---feels like everytime she eats the food feels like it is stuck and will not go down.    finished pred 03/03/16 no worse since finished Still trouble with swallowing sensation solid food  stick about half way down unless chews well and did not have that before MI On ppi but not consistently ac  Really Not limited by breathing from desired activities  But still very sedentary  Rec Change prilosec Take 30- 60 min before your first and last meals of the day  GERD diet reviewed, bed blocks rec  Please see patient coordinator before you leave today  to schedule GI eval for trouble swallowing       06/10/2024  Re-establish  ov/Shanan Fitzpatrick re: ***   maint on ***  No chief complaint on file.   Dyspnea:  *** Cough: *** Sleeping: *** resp cc  SABA use: *** 02: ***  Lung cancer screening :  ***    No obvious day to day or daytime variability or assoc excess/ purulent sputum or mucus plugs or hemoptysis or cp or chest  tightness, subjective wheeze or overt sinus or hb symptoms.    Also denies any obvious fluctuation of symptoms with weather or environmental changes or other aggravating or alleviating factors except as outlined above   No unusual exposure hx or h/o childhood pna/ asthma or knowledge of premature birth.  Current Allergies, Complete Past Medical History, Past Surgical History, Family History, and Social History were reviewed in [employer] record.  ROS  The following are not active complaints unless bolded Hoarseness, sore throat, dysphagia, dental problems, itching, sneezing,  nasal congestion or discharge of excess mucus or purulent secretions, ear ache,   fever, chills, sweats, unintended wt loss or wt gain, classically pleuritic or exertional cp,  orthopnea pnd or arm/hand swelling  or leg swelling, presyncope, palpitations, abdominal pain, anorexia, nausea, vomiting, diarrhea  or change in bowel habits or change in bladder habits, change in stools or change in urine, dysuria, hematuria,  rash, arthralgias, visual complaints, headache, numbness, weakness or ataxia or problems with walking or coordination,  change in mood or  memory.        No outpatient medications have been marked as taking for the 06/10/24 encounter (Appointment) with Ming Kunka B, MD.                Objective:   Physical Exam  Wts  06/10/2024         ***  03/10/16 233 lb 4.8 oz (105.824 kg)  03/09/16 228 lb 6 oz (103.59 kg)  03/06/16 222 lb 3.2 oz (100.789 kg)       Vital signs reviewed  06/10/2024  - Note at rest 02 sats  ***% on ***   General appearance:    ***    Assessment:

## 2024-06-10 ENCOUNTER — Ambulatory Visit: Admitting: Internal Medicine

## 2024-06-15 ENCOUNTER — Ambulatory Visit (INDEPENDENT_AMBULATORY_CARE_PROVIDER_SITE_OTHER): Admitting: Family Medicine

## 2024-06-15 ENCOUNTER — Encounter: Payer: Self-pay | Admitting: Family Medicine

## 2024-06-15 VITALS — BP 122/74 | HR 52 | Ht 62.0 in | Wt 221.6 lb

## 2024-06-15 DIAGNOSIS — Z0000 Encounter for general adult medical examination without abnormal findings: Secondary | ICD-10-CM

## 2024-06-15 DIAGNOSIS — R42 Dizziness and giddiness: Secondary | ICD-10-CM | POA: Insufficient documentation

## 2024-06-15 DIAGNOSIS — E1140 Type 2 diabetes mellitus with diabetic neuropathy, unspecified: Secondary | ICD-10-CM | POA: Diagnosis not present

## 2024-06-15 DIAGNOSIS — I152 Hypertension secondary to endocrine disorders: Secondary | ICD-10-CM

## 2024-06-15 DIAGNOSIS — I2510 Atherosclerotic heart disease of native coronary artery without angina pectoris: Secondary | ICD-10-CM

## 2024-06-15 DIAGNOSIS — E1159 Type 2 diabetes mellitus with other circulatory complications: Secondary | ICD-10-CM

## 2024-06-15 DIAGNOSIS — E785 Hyperlipidemia, unspecified: Secondary | ICD-10-CM | POA: Diagnosis not present

## 2024-06-15 DIAGNOSIS — I10 Essential (primary) hypertension: Secondary | ICD-10-CM | POA: Diagnosis not present

## 2024-06-15 DIAGNOSIS — E1169 Type 2 diabetes mellitus with other specified complication: Secondary | ICD-10-CM | POA: Diagnosis not present

## 2024-06-15 DIAGNOSIS — F341 Dysthymic disorder: Secondary | ICD-10-CM

## 2024-06-15 LAB — POCT GLYCOSYLATED HEMOGLOBIN (HGB A1C): HbA1c, POC (controlled diabetic range): 6 % (ref 0.0–7.0)

## 2024-06-15 MED ORDER — METFORMIN HCL ER 500 MG PO TB24: 1000.0000 mg | ORAL_TABLET | Freq: Two times a day (BID) | ORAL | 1 refills | Status: AC

## 2024-06-15 MED ORDER — AMLODIPINE-OLMESARTAN 5-20 MG PO TABS
1.0000 | ORAL_TABLET | Freq: Every day | ORAL | 1 refills | Status: AC
Start: 2024-06-15 — End: ?

## 2024-06-15 MED ORDER — ATORVASTATIN CALCIUM 40 MG PO TABS
40.0000 mg | ORAL_TABLET | Freq: Every day | ORAL | 3 refills | Status: AC
Start: 2024-06-15 — End: ?

## 2024-06-15 MED ORDER — EMPAGLIFLOZIN 10 MG PO TABS: 10.0000 mg | ORAL_TABLET | Freq: Every day | ORAL | 1 refills | Status: AC

## 2024-06-15 NOTE — Assessment & Plan Note (Signed)
 With good weight loss with GLP-1 initiation, well tolerating. Recommend keep current dose.

## 2024-06-15 NOTE — Patient Instructions (Addendum)
 It was great to see you!  Our plans for today:  - We ordered a screening mammogram for you today. You can call [facility] at [phone] to schedule this.  - We sent refills to your pharmacy. No changes to your medications. Try to remember to take your cholesterol medication daily.  - Take your time when changing positions. Make sure to stay hydrated. I expect the dizziness to start subsiding but if not in the next few weeks, come back to see us .   - Call for an eye appointment.  - Consider getting your shingles, tetanus and pneumonia vaccines at the pharmacy.   We are checking some labs today, we will release these results to your MyChart.  Take care and seek immediate care sooner if you develop any concerns.   Dr. Tomoko Sandra

## 2024-06-15 NOTE — Assessment & Plan Note (Signed)
 Asymptomatic. Counseled on adherence to statin.

## 2024-06-15 NOTE — Progress Notes (Signed)
 BP 122/74   Pulse (!) 52   Ht 5' 2 (1.575 m)   Wt 221 lb 9.6 oz (100.5 kg)   LMP 01/13/2018 (LMP Unknown)   SpO2 97%   BMI 40.53 kg/m    Subjective:    Patient ID: [name], female    DOB: 1966/06/14, 58 y.o.   MRN: [id number]  HPI: [name] is a 58 y.o. female presenting on 06/15/2024 for comprehensive medical examination. Current medical complaints include: dizziness  Hypertension, CAD, HLD: - Medications: amlodipine -olmesartan , lipitor - Compliance: good - Denies any SOB, CP. Endorsing some orthostatic sympotms  Diabetes, Type 2 - Last A1c 9.4 03/2024 - Medications: jardiance , metformin , ozempic  - Compliance: good - Eye exam: due - Foot exam: due - Microalbumin: UTD - Statin: yes - Denies symptoms of hypoglycemia, polyuria, polydipsia, numbness extremities, foot ulcers/trauma  Dizziness - 3 months. Worse with movement. Describes as feeling unsteady. Is not brought on by turning head. Denies chest pain, SOB.   Depression Screen done today and results listed below:     06/15/2024   10:29 AM 04/23/2024    9:29 AM 03/27/2024    1:17 PM 03/06/2024    2:17 PM 02/20/2024   11:20 AM  Depression screen PHQ 2/9  Decreased Interest 1 2 1 2 1   Down, Depressed, Hopeless 1 1 1 1 1   PHQ - 2 Score 2 3 2 3 2   Altered sleeping 1 2 2  0 2  Tired, decreased energy 1 2 1 2 2   Change in appetite 1 1 1  0 1  Feeling bad or failure about yourself  1 1 0 1 1  Trouble concentrating 1 1 1 1 1   Moving slowly or fidgety/restless 0 1 0 0   Suicidal thoughts 1 0 0 0 0  PHQ-9 Score 8 11 7 7 9   Difficult doing work/chores   Somewhat difficult Somewhat difficult Somewhat difficult    Past medical history, surgical history, medications, allergies, family history and social history reviewed with patient today and changes made to appropriate areas of the chart.      Objective:    BP 122/74   Pulse (!) 52   Ht 5' 2 (1.575 m)   Wt 221 lb 9.6 oz (100.5 kg)   LMP 01/13/2018 (LMP  Unknown)   SpO2 97%   BMI 40.53 kg/m   Wt Readings from Last 3 Encounters:  06/15/24 221 lb 9.6 oz (100.5 kg)  04/23/24 233 lb 9.6 oz (106 kg)  03/27/24 236 lb 9.6 oz (107.3 kg)    Physical Exam Constitutional:      Appearance: She is obese.  HENT:     Head: Normocephalic and atraumatic.     Right Ear: External ear normal.     Left Ear: External ear normal.     Nose: Nose normal.  Eyes:     Extraocular Movements: Extraocular movements intact.  Cardiovascular:     Rate and Rhythm: Normal rate and regular rhythm.     Heart sounds: Normal heart sounds. No murmur heard. Pulmonary:     Effort: Pulmonary effort is normal.     Breath sounds: Normal breath sounds.  Abdominal:     General: Bowel sounds are normal.     Palpations: Abdomen is soft.     Tenderness: There is no abdominal tenderness. There is no guarding.  Musculoskeletal:        General: Normal range of motion.     Right lower leg: No edema.  Left lower leg: No edema.  Skin:    General: Skin is warm and dry.  Neurological:     Mental Status: She is alert and oriented to person, place, and time. Mental status is at baseline.     Gait: Gait normal.  Psychiatric:        Mood and Affect: Mood normal.        Behavior: Behavior normal.     Results for orders placed or performed in visit on 06/15/24  HgB A1c   Collection Time: 06/15/24 10:23 AM  Result Value Ref Range   Hemoglobin A1C     HbA1c POC (<> result, manual entry)     HbA1c, POC (prediabetic range)     HbA1c, POC (controlled diabetic range) 6.0 0.0 - 7.0 %      Assessment & Plan:   Problem List Items Addressed This Visit       Cardiovascular and Mediastinum   Type 2 diabetes mellitus with other circulatory complications (HCC) - Primary (Chronic)   At goal, no changes      Relevant Medications   empagliflozin  (JARDIANCE ) 10 MG TABS tablet   amLODipine -olmesartan  (AZOR ) 5-20 MG tablet   atorvastatin  (LIPITOR) 40 MG tablet   metFORMIN   (GLUCOPHAGE -XR) 500 MG 24 hr tablet   Other Relevant Orders   HgB A1c (Completed)   Comprehensive metabolic panel with GFR   Ambulatory referral to Ophthalmology   Hypertension associated with diabetes (HCC)   Initially elevated, improved on recheck. Orthostatics negative. No changes.      Relevant Medications   empagliflozin  (JARDIANCE ) 10 MG TABS tablet   amLODipine -olmesartan  (AZOR ) 5-20 MG tablet   atorvastatin  (LIPITOR) 40 MG tablet   metFORMIN  (GLUCOPHAGE -XR) 500 MG 24 hr tablet   CAD, multiple vessel, S/P RCA DES   Asymptomatic. Counseled on adherence to statin.       Relevant Medications   amLODipine -olmesartan  (AZOR ) 5-20 MG tablet   atorvastatin  (LIPITOR) 40 MG tablet     Endocrine   Hyperlipidemia associated with type 2 diabetes mellitus (HCC)   Counseled on static adherence.      Relevant Medications   empagliflozin  (JARDIANCE ) 10 MG TABS tablet   amLODipine -olmesartan  (AZOR ) 5-20 MG tablet   atorvastatin  (LIPITOR) 40 MG tablet   metFORMIN  (GLUCOPHAGE -XR) 500 MG 24 hr tablet   Painful diabetic neuropathy (HCC)   Refill gabapentin . Needs foot exam at follow up.      Relevant Medications   empagliflozin  (JARDIANCE ) 10 MG TABS tablet   amLODipine -olmesartan  (AZOR ) 5-20 MG tablet   atorvastatin  (LIPITOR) 40 MG tablet   metFORMIN  (GLUCOPHAGE -XR) 500 MG 24 hr tablet     Other   Morbid (severe) obesity due to excess calories (HCC)   With good weight loss with GLP-1 initiation, well tolerating. Recommend keep current dose.      Relevant Medications   empagliflozin  (JARDIANCE ) 10 MG TABS tablet   metFORMIN  (GLUCOPHAGE -XR) 500 MG 24 hr tablet   Depression   No SI at this time. Has list of counselors provided by PCP, historically resistant to medication therapy. Continue to assess readiness at follow up.       Other Visit Diagnoses       Hypertension, unspecified type       Relevant Medications   amLODipine -olmesartan  (AZOR ) 5-20 MG tablet   atorvastatin   (LIPITOR) 40 MG tablet     Coronary artery disease involving native coronary artery of native heart without angina pectoris       Relevant Medications  amLODipine -olmesartan  (AZOR ) 5-20 MG tablet   atorvastatin  (LIPITOR) 40 MG tablet     Hyperlipidemia with target LDL less than 100       Relevant Medications   amLODipine -olmesartan  (AZOR ) 5-20 MG tablet   atorvastatin  (LIPITOR) 40 MG tablet        Follow up plan: Return in about 3 months (around 09/15/2024) for diabetes.   LABORATORY TESTING:  - Pap smear: up to date  IMMUNIZATIONS:   - Tdap: Tetanus vaccination status reviewed: due, unable to give today due to stock, will return for vaccination. - Influenza: Postponed to flu season - Pneumococcal: due, unable to give due to stock, will return for vaccination - HPV: Not applicable - Shingrix vaccine: due, discussed. Recommended to receive at pharmacy.   SCREENING: - Mammogram: due, previously ordered. Will call for appt.   - Colonoscopy: UTD with cologuard.   - Lung Cancer Screening: Not applicable   Hep C Screening: chronic hep C, s/p treatment  PATIENT COUNSELING:   Advised to take 1 mg of folate supplement per day if capable of pregnancy.   Sexuality: Discussed sexually transmitted diseases, partner selection, use of condoms, avoidance of unintended pregnancy  and contraceptive alternatives.   Advised to avoid cigarette smoking.  I discussed with the patient that most people either abstain from alcohol or drink within safe limits (<=14/week and <=4 drinks/occasion for males, <=7/weeks and <= 3 drinks/occasion for females) and that the risk for alcohol disorders and other health effects rises proportionally with the number of drinks per week and how often a drinker exceeds daily limits.  Discussed cessation/primary prevention of drug use and availability of treatment for abuse.   Diet: Encouraged to adjust caloric intake to maintain  or achieve ideal body weight, to  reduce intake of dietary saturated fat and total fat, to limit sodium intake by avoiding high sodium foods and not adding table salt, and to maintain adequate dietary potassium and calcium  preferably from fresh fruits, vegetables, and low-fat dairy products.    stressed the importance of regular exercise  Injury prevention: Discussed safety belts, safety helmets, smoke detector, smoking near bedding or upholstery.   Dental health: Discussed importance of regular tooth brushing, flossing, and dental visits.    NEXT PREVENTATIVE PHYSICAL DUE IN 1 YEAR. Return in about 3 months (around 09/15/2024) for diabetes.

## 2024-06-15 NOTE — Assessment & Plan Note (Addendum)
 Refill gabapentin . Needs foot exam at follow up.

## 2024-06-15 NOTE — Assessment & Plan Note (Signed)
 No SI at this time. Has list of counselors provided by PCP, historically resistant to medication therapy. Continue to assess readiness at follow up.

## 2024-06-15 NOTE — Assessment & Plan Note (Addendum)
 Initially elevated, improved on recheck. Orthostatics negative. No changes.

## 2024-06-15 NOTE — Assessment & Plan Note (Signed)
 Counseled on static adherence.

## 2024-06-15 NOTE — Assessment & Plan Note (Signed)
 At goal, no changes

## 2024-06-15 NOTE — Assessment & Plan Note (Signed)
 No red flag symtpoms. Orthostatics and dix hallpike negative. Suspect may be due to quick tighter glucose control over the last few months given duration. Will give a few more weeks, if still sympotmatic, will return to clinic for reassessment.

## 2024-06-16 ENCOUNTER — Ambulatory Visit: Payer: Self-pay | Admitting: Family Medicine

## 2024-06-16 LAB — COMPREHENSIVE METABOLIC PANEL WITH GFR
ALT: 21 IU/L (ref 0–32)
AST: 21 IU/L (ref 0–40)
Albumin: 4 g/dL (ref 3.8–4.9)
Alkaline Phosphatase: 71 IU/L (ref 44–121)
BUN/Creatinine Ratio: 12 (ref 9–23)
BUN: 10 mg/dL (ref 6–24)
Bilirubin Total: 0.7 mg/dL (ref 0.0–1.2)
CO2: 22 mmol/L (ref 20–29)
Calcium: 10.1 mg/dL (ref 8.7–10.2)
Chloride: 108 mmol/L — ABNORMAL HIGH (ref 96–106)
Creatinine, Ser: 0.81 mg/dL (ref 0.57–1.00)
Globulin, Total: 2.2 g/dL (ref 1.5–4.5)
Glucose: 107 mg/dL — ABNORMAL HIGH (ref 70–99)
Potassium: 4.3 mmol/L (ref 3.5–5.2)
Sodium: 147 mmol/L — ABNORMAL HIGH (ref 134–144)
Total Protein: 6.2 g/dL (ref 6.0–8.5)
eGFR: 85 mL/min/1.73 (ref >59)

## 2024-07-09 ENCOUNTER — Other Ambulatory Visit: Payer: Self-pay | Admitting: Family Medicine

## 2024-07-13 ENCOUNTER — Other Ambulatory Visit: Payer: Self-pay | Admitting: Family Medicine

## 2024-07-13 NOTE — Progress Notes (Signed)
 Asking [name] whether she wishes to stay at her current 1 mg weekly dose of semaglutide  or increase to the maximal dose of 2 mg weekly.   Will await her answer before prescribing her semaglutide .

## 2024-07-30 ENCOUNTER — Encounter: Payer: Self-pay | Admitting: Podiatry

## 2024-07-30 ENCOUNTER — Ambulatory Visit (INDEPENDENT_AMBULATORY_CARE_PROVIDER_SITE_OTHER): Admitting: Podiatry

## 2024-07-30 DIAGNOSIS — B351 Tinea unguium: Secondary | ICD-10-CM | POA: Diagnosis not present

## 2024-07-30 DIAGNOSIS — M79674 Pain in right toe(s): Secondary | ICD-10-CM | POA: Diagnosis not present

## 2024-07-30 DIAGNOSIS — E1159 Type 2 diabetes mellitus with other circulatory complications: Secondary | ICD-10-CM

## 2024-07-30 DIAGNOSIS — M79675 Pain in left toe(s): Secondary | ICD-10-CM | POA: Diagnosis not present

## 2024-07-30 NOTE — Progress Notes (Signed)
 This patient returns to my office for at risk foot care.  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 This patient is unable to cut nails herself since the patient cannot reach her nails.These nails are painful walking and wearing shoes.  This patient presents for at risk foot care today.  General Appearance  Alert, conversant and in no acute stress.  Vascular  Dorsalis pedis and posterior tibial  pulses are palpable  bilaterally.  Capillary return is within normal limits  bilaterally. Temperature is within normal limits  bilaterally.  Neurologic  Senn-Weinstein monofilament wire test absent  bilaterally. Muscle power within normal limits bilaterally.  Nails Thick disfigured discolored nails with subungual debris  from hallux to fifth toes bilaterally. No evidence of bacterial infection or drainage bilaterally.  Orthopedic  No limitations of motion  feet .  No crepitus or effusions noted.  Amputation third toe left foot. Thickening second toe left foot.  Skin  normotropic skin with no porokeratosis noted bilaterally.  No signs of infections or ulcers noted.     Onychomycosis  Pain in right toes  Pain in left toes  Consent was obtained for treatment procedures.   Mechanical debridement of nails 1-5  bilaterally performed with a nail nipper.  Filed with dremel without incident. Patient requests an appointment with Dr.  Silva for her swollen left foot.   Return office visit   3 months                   Told patient to return for periodic foot care and evaluation due to potential at risk complications.   Cordella Bold DPM

## 2024-08-14 ENCOUNTER — Encounter: Payer: Self-pay | Admitting: Cardiovascular Disease

## 2024-08-20 ENCOUNTER — Ambulatory Visit: Payer: Self-pay | Admitting: Podiatry

## 2024-11-02 ENCOUNTER — Ambulatory Visit: Payer: Self-pay | Admitting: Podiatry

## 2024-12-03 ENCOUNTER — Other Ambulatory Visit: Payer: Self-pay

## 2024-12-03 DIAGNOSIS — E1140 Type 2 diabetes mellitus with diabetic neuropathy, unspecified: Secondary | ICD-10-CM

## 2024-12-07 MED ORDER — GABAPENTIN 600 MG PO TABS: 1200.0000 mg | ORAL_TABLET | Freq: Three times a day (TID) | ORAL | 5 refills | Status: AC

## 2025-01-07 ENCOUNTER — Ambulatory Visit: Admitting: Family Medicine
